# Patient Record
Sex: Male | Born: 1945 | Race: Black or African American | Hispanic: No | Marital: Married | Smoking: Former smoker
Health system: Southern US, Community
[De-identification: ages and names within clinical notes are randomized; demographics above are authoritative.]

## PROBLEM LIST (undated history)

## (undated) DIAGNOSIS — R011 Cardiac murmur, unspecified: Secondary | ICD-10-CM

## (undated) DIAGNOSIS — N402 Nodular prostate without lower urinary tract symptoms: Secondary | ICD-10-CM

## (undated) DIAGNOSIS — G9341 Metabolic encephalopathy: Secondary | ICD-10-CM

## (undated) DIAGNOSIS — N182 Chronic kidney disease, stage 2 (mild): Secondary | ICD-10-CM

## (undated) DIAGNOSIS — E785 Hyperlipidemia, unspecified: Secondary | ICD-10-CM

## (undated) DIAGNOSIS — J449 Chronic obstructive pulmonary disease, unspecified: Secondary | ICD-10-CM

## (undated) DIAGNOSIS — N390 Urinary tract infection, site not specified: Secondary | ICD-10-CM

## (undated) DIAGNOSIS — F329 Major depressive disorder, single episode, unspecified: Secondary | ICD-10-CM

## (undated) DIAGNOSIS — E1165 Type 2 diabetes mellitus with hyperglycemia: Secondary | ICD-10-CM

## (undated) DIAGNOSIS — M109 Gout, unspecified: Secondary | ICD-10-CM

## (undated) DIAGNOSIS — J4489 Other specified chronic obstructive pulmonary disease: Secondary | ICD-10-CM

## (undated) DIAGNOSIS — E669 Obesity, unspecified: Secondary | ICD-10-CM

## (undated) DIAGNOSIS — R809 Proteinuria, unspecified: Secondary | ICD-10-CM

## (undated) DIAGNOSIS — N529 Male erectile dysfunction, unspecified: Secondary | ICD-10-CM

## (undated) DIAGNOSIS — B070 Plantar wart: Secondary | ICD-10-CM

## (undated) DIAGNOSIS — F3289 Other specified depressive episodes: Secondary | ICD-10-CM

## (undated) DIAGNOSIS — G4730 Sleep apnea, unspecified: Secondary | ICD-10-CM

## (undated) DIAGNOSIS — IMO0001 Reserved for inherently not codable concepts without codable children: Secondary | ICD-10-CM

## (undated) DIAGNOSIS — M659 Unspecified synovitis and tenosynovitis, unspecified site: Secondary | ICD-10-CM

## (undated) DIAGNOSIS — Z992 Dependence on renal dialysis: Secondary | ICD-10-CM

## (undated) DIAGNOSIS — M1990 Unspecified osteoarthritis, unspecified site: Secondary | ICD-10-CM

## (undated) DIAGNOSIS — I872 Venous insufficiency (chronic) (peripheral): Secondary | ICD-10-CM

## (undated) DIAGNOSIS — N400 Enlarged prostate without lower urinary tract symptoms: Secondary | ICD-10-CM

## (undated) DIAGNOSIS — I10 Essential (primary) hypertension: Secondary | ICD-10-CM

## (undated) HISTORY — DX: Male erectile dysfunction, unspecified: N52.9

## (undated) HISTORY — DX: Cardiac murmur, unspecified: R01.1

## (undated) HISTORY — DX: Proteinuria, unspecified: R80.9

## (undated) HISTORY — PX: DIALYSIS/PERMA CATHETER INSERTION: CATH118288

## (undated) HISTORY — DX: Plantar wart: B07.0

## (undated) HISTORY — DX: Unspecified osteoarthritis, unspecified site: M19.90

## (undated) HISTORY — DX: Major depressive disorder, single episode, unspecified: F32.9

## (undated) HISTORY — DX: Reserved for inherently not codable concepts without codable children: IMO0001

## (undated) HISTORY — DX: Essential (primary) hypertension: I10

## (undated) HISTORY — DX: Obesity, unspecified: E66.9

## (undated) HISTORY — DX: Type 2 diabetes mellitus with hyperglycemia: E11.65

## (undated) HISTORY — DX: Benign prostatic hyperplasia without lower urinary tract symptoms: N40.0

## (undated) HISTORY — DX: Other specified chronic obstructive pulmonary disease: J44.89

## (undated) HISTORY — PX: OTHER SURGICAL HISTORY: SHX169

## (undated) HISTORY — DX: Sleep apnea, unspecified: G47.30

## (undated) HISTORY — DX: Chronic kidney disease, stage 2 (mild): N18.2

## (undated) HISTORY — DX: Hyperlipidemia, unspecified: E78.5

## (undated) HISTORY — DX: Synovitis and tenosynovitis, unspecified: M65.9

## (undated) HISTORY — DX: Venous insufficiency (chronic) (peripheral): I87.2

## (undated) HISTORY — DX: Other specified depressive episodes: F32.89

## (undated) HISTORY — DX: Chronic obstructive pulmonary disease, unspecified: J44.9

## (undated) HISTORY — DX: Nodular prostate without lower urinary tract symptoms: N40.2

## (undated) HISTORY — DX: Gout, unspecified: M10.9

## (undated) HISTORY — DX: Unspecified synovitis and tenosynovitis, unspecified site: M65.90

---

## 2004-10-03 ENCOUNTER — Ambulatory Visit: Payer: Self-pay

## 2004-10-25 ENCOUNTER — Ambulatory Visit: Payer: Self-pay

## 2006-08-20 ENCOUNTER — Ambulatory Visit: Payer: Self-pay | Admitting: Family Medicine

## 2006-12-05 ENCOUNTER — Ambulatory Visit: Payer: Self-pay | Admitting: Specialist

## 2007-01-19 ENCOUNTER — Other Ambulatory Visit: Payer: Self-pay

## 2007-01-19 ENCOUNTER — Emergency Department: Payer: Self-pay | Admitting: Emergency Medicine

## 2007-01-31 ENCOUNTER — Ambulatory Visit: Payer: Self-pay | Admitting: Rheumatology

## 2007-04-24 ENCOUNTER — Ambulatory Visit: Payer: Self-pay | Admitting: Specialist

## 2007-09-03 ENCOUNTER — Ambulatory Visit: Payer: Self-pay | Admitting: Specialist

## 2008-01-23 ENCOUNTER — Emergency Department: Payer: Self-pay | Admitting: Emergency Medicine

## 2009-08-30 ENCOUNTER — Ambulatory Visit: Payer: Self-pay | Admitting: Gastroenterology

## 2011-10-09 ENCOUNTER — Ambulatory Visit (INDEPENDENT_AMBULATORY_CARE_PROVIDER_SITE_OTHER): Payer: Medicare Other | Admitting: Cardiovascular Disease

## 2011-10-09 ENCOUNTER — Encounter: Payer: Self-pay | Admitting: Cardiovascular Disease

## 2011-10-09 VITALS — BP 170/84 | HR 96 | Ht 77.0 in | Wt 309.2 lb

## 2011-10-09 DIAGNOSIS — R0989 Other specified symptoms and signs involving the circulatory and respiratory systems: Secondary | ICD-10-CM

## 2011-10-09 DIAGNOSIS — R0609 Other forms of dyspnea: Secondary | ICD-10-CM

## 2011-10-09 DIAGNOSIS — I10 Essential (primary) hypertension: Secondary | ICD-10-CM

## 2011-10-09 DIAGNOSIS — Z01810 Encounter for preprocedural cardiovascular examination: Secondary | ICD-10-CM

## 2011-10-09 DIAGNOSIS — R0600 Dyspnea, unspecified: Secondary | ICD-10-CM

## 2011-10-09 NOTE — Progress Notes (Signed)
HPI  This is a 66 year old male who is here today for consultation regarding preoperative cardiovascular evaluation for anticipated left foot surgery which is scheduled for tomorrow. The patient has no previous cardiac history. He has multiple chronic medical conditions that include hypertension, obesity, hyperlipidemia and type 2 diabetes. He denies any chest pain. He reports mild exertional dyspnea. He is able to do his activities of daily living without significant limitations. He had a recent ECG done showed Q waves in the inferior leads. As mentioned above, is not aware of previous history of myocardial infarction.  No Known Allergies   Current Outpatient Prescriptions on File Prior to Visit  Medication Sig Dispense Refill  . aspirin EC 81 MG tablet Take 81 mg by mouth daily.      . Blood Glucose Monitoring Suppl (ONE TOUCH ULTRA SYSTEM KIT) W/DEVICE KIT 1 kit by Does not apply route once.      . Cholecalciferol (VITAMIN D) 2000 UNITS CAPS Take by mouth.      . insulin aspart (NOVOLOG FLEXPEN) 100 UNIT/ML injection Inject into the skin 3 (three) times daily before meals.      . insulin detemir (LEVEMIR FLEXPEN) 100 UNIT/ML injection Inject into the skin at bedtime.      . Insulin Pen Needle 31G X 6 MM MISC by Does not apply route.      Marland Kitchen NEEDLE, DISP, 22 G (B-D DISP NEEDLE 22GX3/4") 22G X 3/4" MISC by Does not apply route.      . Needles & Syringes MISC by Does not apply route.      . niacin-simvastatin (SIMCOR) 1000-20 MG 24 hr tablet Take 1 tablet by mouth at bedtime.      . ONE TOUCH LANCETS MISC by Does not apply route.      . Saxagliptin-Metformin (KOMBIGLYZE XR) 2.05-998 MG TB24 Take 2 tablets daily      . telmisartan (MICARDIS) 80 MG tablet Take 80 mg by mouth daily.         Past Medical History  Diagnosis Date  . Plantar wart   . Hypertension   . Hyperlipidemia   . Type II or unspecified type diabetes mellitus without mention of complication, uncontrolled   . Obesity,  unspecified   . Microalbuminuria   . Chronic airway obstruction, not elsewhere classified   . Depressive disorder, not elsewhere classified   . Impotence of organic origin   . 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 Hypertrophy of prostate without urinary obstruction and other lower urinary tract symptoms (LUTS)   . Synovitis and tenosynovitis, unspecified   . Unspecified venous (peripheral) insufficiency      History reviewed. No pertinent past surgical history.   Family History  Problem Relation Age of Onset  . Hypertension Mother      History   Social History  . Marital Status: Married    Spouse Name: N/A    Number of Children: N/A  . Years of Education: N/A   Occupational History  . Not on file.   Social History Main Topics  . Smoking status: Former Smoker -- 1.0 packs/day for 30 years    Types: Cigarettes    Quit date: 02/25/2003  . Smokeless tobacco: Not on file  . Alcohol Use: Yes     occasional  . Drug Use: No  . Sexually Active:    Other Topics Concern  . Not on file   Social History Narrative  . No narrative on file  ROS Constitutional: Negative for fever, chills, diaphoresis, activity change, appetite change and fatigue.  HENT: Negative for hearing loss, nosebleeds, congestion, sore throat, facial swelling, drooling, trouble swallowing, neck pain, voice change, sinus pressure and tinnitus.  Eyes: Negative for photophobia, pain, discharge and visual disturbance.  Respiratory: Negative for apnea, cough, chest tightness and wheezing.  Cardiovascular: Negative for chest pain, palpitations and leg swelling.  Gastrointestinal: Negative for nausea, vomiting, abdominal pain, diarrhea, constipation, blood in stool and abdominal distention.  Genitourinary: Negative for dysuria, urgency, frequency, hematuria and decreased urine volume.  Musculoskeletal: Negative for myalgias, back pain, joint swelling, arthralgias and gait problem.  Skin:  Negative for color change, pallor, rash and wound.  Neurological: Negative for dizziness, tremors, seizures, syncope, speech difficulty, weakness, light-headedness, numbness and headaches.  Psychiatric/Behavioral: Negative for suicidal ideas, hallucinations, behavioral problems and agitation. The patient is not nervous/anxious.     PHYSICAL EXAM   BP 170/84  Pulse 96  Ht 6\' 5"  (1.956 m)  Wt 309 lb 4 oz (140.275 kg)  BMI 36.67 kg/m2 Constitutional: He is oriented to person, place, and time. He appears well-developed and well-nourished. No distress.  HENT: No nasal discharge.  Head: Normocephalic and atraumatic.  Eyes: Pupils are equal and round. Right eye exhibits no discharge. Left eye exhibits no discharge.  Neck: Normal range of motion. Neck supple. No JVD present. No thyromegaly present.  Cardiovascular: Normal rate, regular rhythm, normal heart sounds and. Exam reveals no gallop and no friction rub. No murmur heard.  Pulmonary/Chest: Effort normal and breath sounds normal. No stridor. No respiratory distress. He has no wheezes. He has no rales. He exhibits no tenderness.  Abdominal: Soft. Bowel sounds are normal. He exhibits no distension. There is no tenderness. There is no rebound and no guarding.  Musculoskeletal: Normal range of motion. He exhibits no edema and no tenderness.  Neurological: He is alert and oriented to person, place, and time. Coordination normal.  Skin: Skin is warm and dry. No rash noted. He is not diaphoretic. No erythema. No pallor.  Psychiatric: He has a normal mood and affect. His behavior is normal. Judgment and thought content normal.       EKG: Sinus  Rhythm  Low voltage in precordial leads.    ABNORMAL   ASSESSMENT AND PLAN

## 2011-10-09 NOTE — Patient Instructions (Addendum)
You can proceed with surgery at an overall low risk.  Follow up in 4 months.

## 2011-10-09 NOTE — Assessment & Plan Note (Signed)
The patient has no previous cardiac history and has no convincing symptoms of angina. He is able to perform activities of daily living without significant limitations. ECG shows isolated Q wave in lead 3 which is not diagnostic for an inferior MI. Thus, the patient can proceed with the planned foot surgery at an overall low risk. His blood pressure is elevated today but this blood pressure has been reasonably controlled recently. I did not make any changes in his medications.

## 2011-10-09 NOTE — Assessment & Plan Note (Signed)
The patient reports mild exertional dyspnea and has multiple risk factors for coronary artery disease. It's probably worth evaluating him with some form of cardiac stress testing in the future for risk stratification. I don't feel that this is needed before clearing him to have the foot surgery done.

## 2012-04-29 ENCOUNTER — Ambulatory Visit: Payer: Self-pay | Admitting: Family Medicine

## 2012-05-23 ENCOUNTER — Ambulatory Visit: Payer: Self-pay | Admitting: Family Medicine

## 2012-06-23 ENCOUNTER — Ambulatory Visit: Payer: Self-pay | Admitting: Family Medicine

## 2012-11-18 ENCOUNTER — Encounter: Payer: Self-pay | Admitting: *Deleted

## 2012-11-19 ENCOUNTER — Encounter: Payer: Self-pay | Admitting: Podiatry

## 2012-11-19 ENCOUNTER — Ambulatory Visit (INDEPENDENT_AMBULATORY_CARE_PROVIDER_SITE_OTHER): Payer: Medicare Other | Admitting: Podiatry

## 2012-11-19 VITALS — BP 158/99 | HR 94 | Resp 12 | Ht 77.0 in | Wt 304.0 lb

## 2012-11-19 DIAGNOSIS — B351 Tinea unguium: Secondary | ICD-10-CM

## 2012-11-19 DIAGNOSIS — Q828 Other specified congenital malformations of skin: Secondary | ICD-10-CM

## 2012-11-19 DIAGNOSIS — M79609 Pain in unspecified limb: Secondary | ICD-10-CM

## 2012-11-19 NOTE — Progress Notes (Signed)
Subjective:     Patient ID: Joseph Newman, male   DOB: [birth year], 67 y.o.   MRN: [id number]  HPI patient presents stating that corns on the bottom my feet are very sore and my nails. States it started in the last several weeks   Review of Systems     Objective:   Physical Exam  Nursing note and vitals reviewed. Cardiovascular: Intact distal pulses.   Musculoskeletal: Normal range of motion.   severe keratotic lesion subsecond metatarsal right third metatarsal left and left lateral foot Nail disease with extreme thickness and pain 1-5 both feet    Assessment:     Porokeratotic lesion secondary to skin type oh to be and mycotic nail infections 1-5 both feet with pain    Plan:     Debridement of nails 1-5 both feet no iatrogenic bleeding debridement of 3 separate lesions with no iatrogenic bleeding noted

## 2013-02-04 ENCOUNTER — Ambulatory Visit: Payer: Medicare Other | Admitting: Podiatry

## 2013-02-07 ENCOUNTER — Ambulatory Visit: Payer: Self-pay | Admitting: Podiatry

## 2013-02-21 ENCOUNTER — Ambulatory Visit: Payer: Self-pay | Admitting: Podiatry

## 2013-02-28 ENCOUNTER — Encounter: Payer: Self-pay | Admitting: Podiatry

## 2013-02-28 ENCOUNTER — Ambulatory Visit (INDEPENDENT_AMBULATORY_CARE_PROVIDER_SITE_OTHER): Payer: Medicare Other | Admitting: Podiatry

## 2013-02-28 VITALS — BP 164/90 | HR 94 | Resp 16 | Ht 77.0 in | Wt 300.0 lb

## 2013-02-28 DIAGNOSIS — B351 Tinea unguium: Secondary | ICD-10-CM

## 2013-02-28 DIAGNOSIS — M79609 Pain in unspecified limb: Secondary | ICD-10-CM

## 2013-02-28 DIAGNOSIS — L84 Corns and callosities: Secondary | ICD-10-CM

## 2013-02-28 NOTE — Progress Notes (Signed)
Subjective:     Patient ID: Joseph Newman, male   DOB: [birth year], 68 y.o.   MRN: [id number]  HPI patient presents with thick painful nailbeds 1-5 both feet that he cannot cut them keratotic lesion sub-second metatarsal right third metatarsal left that are thick and painful and unable to be taken care of   Review of Systems     Objective:   Physical Exam Neurovascular status intact with history unchanged and severe plantar keratotic lesions painful and nails are very thick and deformed and painful 1-5 both feet    Assessment:     Mycotic nail infection with pain 1-5 both in severe keratotic lesions plantar aspect both feet    Plan:     Debridement of painful nailbeds 1-5 both feet and debridement of lesions plantar aspect both feet

## 2013-03-03 ENCOUNTER — Ambulatory Visit (INDEPENDENT_AMBULATORY_CARE_PROVIDER_SITE_OTHER): Payer: 59 | Admitting: Cardiovascular Disease

## 2013-03-03 ENCOUNTER — Encounter: Payer: Self-pay | Admitting: Cardiovascular Disease

## 2013-03-03 VITALS — BP 175/104 | HR 88 | Ht 77.0 in | Wt 290.5 lb

## 2013-03-03 DIAGNOSIS — R0600 Dyspnea, unspecified: Secondary | ICD-10-CM

## 2013-03-03 DIAGNOSIS — R0609 Other forms of dyspnea: Secondary | ICD-10-CM

## 2013-03-03 DIAGNOSIS — I10 Essential (primary) hypertension: Secondary | ICD-10-CM | POA: Insufficient documentation

## 2013-03-03 DIAGNOSIS — R0989 Other specified symptoms and signs involving the circulatory and respiratory systems: Secondary | ICD-10-CM

## 2013-03-03 MED ORDER — AMLODIPINE BESYLATE 5 MG PO TABS: 5.0000 mg | ORAL_TABLET | Freq: Every day | ORAL | Status: DC

## 2013-03-03 NOTE — Assessment & Plan Note (Signed)
His blood pressure is significantly uncontrolled. I increased amlodipine to 5 mg once daily. If renal function is normal, he would benefit from being on a thiazide diuretic.

## 2013-03-03 NOTE — Assessment & Plan Note (Signed)
The patient has significant exertional dyspnea without chest discomfort. He has multiple risk factors for coronary artery disease including uncontrolled diabetes and hypertension. I recommend further evaluation with a pharmacologic nuclear stress test. He is not able to exercise on a treadmill due to previous left foot surgery.

## 2013-03-03 NOTE — Progress Notes (Signed)
HPI  This is a 68 year old male who is here today for a followup visit regarding exertional dyspnea and hypertension. I saw him in 2013  For preoperative cardiovascular evaluation for anticipated left foot surgery. His EKG showed isolated Q wave in lead 3.  He has multiple chronic medical conditions that include hypertension, obesity, hyperlipidemia and type 2 diabetes.  Given the lack of anginal symptoms reasonable functional capacity, he was cleared for the surgery which was done without complications.  I instructed him at that time to return to see me for further ischemic evaluation given his multiple risk factors for coronary artery disease and exertional dyspnea. His exertional dyspnea has worsened over the last year with no chest discomfort. No orthopnea or PND. His diabetes continues to be uncontrolled with most recent hemoglobin A1c of 13. His blood pressure is also uncontrolled. Micardis was added recently.   No Known Allergies   Current Outpatient Prescriptions on File Prior to Visit  Medication Sig Dispense Refill  . aspirin EC 81 MG tablet Take 81 mg by mouth daily.      . Blood Glucose Monitoring Suppl (ONE TOUCH ULTRA SYSTEM KIT) W/DEVICE KIT 1 kit by Does not apply route once.      . Cholecalciferol (VITAMIN D) 2000 UNITS CAPS Take by mouth.      . insulin aspart (NOVOLOG FLEXPEN) 100 UNIT/ML injection Inject into the skin 3 (three) times daily before meals.      . insulin detemir (LEVEMIR FLEXPEN) 100 UNIT/ML injection Inject into the skin at bedtime.      . Insulin Pen Needle 31G X 6 MM MISC by Does not apply route.      Marland Kitchen NEEDLE, DISP, 22 G (B-D DISP NEEDLE 22GX3/4") 22G X 3/4" MISC by Does not apply route.      . Needles & Syringes MISC by Does not apply route.      . niacin-simvastatin (SIMCOR) 1000-20 MG 24 hr tablet Take 1 tablet by mouth at bedtime.      . ONE TOUCH LANCETS MISC by Does not apply route.      Marland Kitchen telmisartan (MICARDIS) 80 MG tablet Take 80 mg by mouth  daily.       No current facility-administered medications on file prior to visit.     Past Medical History  Diagnosis Date  . Plantar wart   . Hypertension   . Hyperlipidemia   . Type II or unspecified type diabetes mellitus without mention of complication, uncontrolled   . Obesity, unspecified   . Microalbuminuria   . Chronic airway obstruction, not elsewhere classified   . Depressive disorder, not elsewhere classified   . Impotence of organic origin   . 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 Hypertrophy of prostate without urinary obstruction and other lower urinary tract symptoms (LUTS)   . Synovitis and tenosynovitis, unspecified   . Unspecified venous (peripheral) insufficiency      Past Surgical History  Procedure Laterality Date  . Foot surgery       Family History  Problem Relation Age of Onset  . Hypertension Mother      History   Social History  . Marital Status: Married    Spouse Name: N/A    Number of Children: N/A  . Years of Education: N/A   Occupational History  . Not on file.   Social History Main Topics  . Smoking status: Current Every Day Smoker -- 1.00 packs/day for 30 years    Types:  Cigarettes, Cigars    Last Attempt to Quit: 02/25/2003  . Smokeless tobacco: Not on file  . Alcohol Use: Yes     Comment: occasional  . Drug Use: No  . Sexual Activity: Not on file   Other Topics Concern  . Not on file   Social History Narrative  . No narrative on file       PHYSICAL EXAM   BP 175/104  Pulse 88  Ht 6' 5"  (1.956 m)  Wt 290 lb 8 oz (131.77 kg)  BMI 34.44 kg/m2 Constitutional: He is oriented to person, place, and time. He appears well-developed and well-nourished. No distress.  HENT: No nasal discharge.  Head: Normocephalic and atraumatic.  Eyes: Pupils are equal and round. Right eye exhibits no discharge. Left eye exhibits no discharge.  Neck: Normal range of motion. Neck supple. No JVD present. No thyromegaly  present.  Cardiovascular: Normal rate, regular rhythm, normal heart sounds and. Exam reveals no gallop and no friction rub. No murmur heard.  Pulmonary/Chest: Effort normal and breath sounds normal. No stridor. No respiratory distress. He has no wheezes. He has no rales. He exhibits no tenderness.  Abdominal: Soft. Bowel sounds are normal. He exhibits no distension. There is no tenderness. There is no rebound and no guarding.  Musculoskeletal: Normal range of motion. He exhibits no edema and no tenderness.  Neurological: He is alert and oriented to person, place, and time. Coordination normal.  Skin: Skin is warm and dry. No rash noted. He is not diaphoretic. No erythema. No pallor.  Psychiatric: He has a normal mood and affect. His behavior is normal. Judgment and thought content normal.       EKG: Normal sinus rhythm with nonspecific T wave changes  ASSESSMENT AND PLAN

## 2013-03-03 NOTE — Patient Instructions (Addendum)
Your physician has recommended you make the following change in your medication:  Increase your Amlodipine to 5 mg daily     Follow up as needed     [place] caregiver has ordered a Stress Test with nuclear imaging. The purpose of this test is to evaluate the blood supply to your heart muscle. This procedure is referred to as a "Non-Invasive Stress Test." This is because other than having an IV started in your vein, nothing is inserted or "invades" your body. Cardiac stress tests are done to find areas of poor blood flow to the heart by determining the extent of coronary artery disease (CAD). Some patients exercise on a treadmill, which naturally increases the blood flow to your heart, while others who are  unable to walk on a treadmill due to physical limitations have a pharmacologic/chemical stress agent called Lexiscan . This medicine will mimic walking on a treadmill by temporarily increasing your coronary blood flow.   Please note: these test may take anywhere between 2-4 hours to complete  PLEASE REPORT TO [facility] AT THE FIRST DESK WILL DIRECT YOU WHERE TO GO  Date of Procedure:_______________2/11/15______________________  Arrival Time for Procedure:___________0745 am___________________  Instructions regarding medication:   _x___ : Hold diabetes medication morning of procedure   PLEASE NOTIFY THE OFFICE AT LEAST 24 HOURS IN ADVANCE IF YOU ARE UNABLE TO KEEP YOUR APPOINTMENT.  [phone] AND  PLEASE NOTIFY NUCLEAR MEDICINE AT [facility] AT LEAST 24 HOURS IN ADVANCE IF YOU ARE UNABLE TO KEEP YOUR APPOINTMENT. [phone]  How to prepare for your Myoview test:  1. Do not eat or drink after midnight 2. No caffeine for 24 hours prior to test 3. No smoking 24 hours prior to test. 4. Your medication may be taken with water.  If your doctor stopped a medication because of this test, do not take that medication. 5. Ladies, please do not wear  dresses.  Skirts or pants are appropriate. Please wear a short sleeve shirt. 6. No perfume, cologne or lotion. 7. Wear comfortable walking shoes. No heels!

## 2013-03-05 ENCOUNTER — Ambulatory Visit: Payer: Self-pay | Admitting: Cardiovascular Disease

## 2013-03-06 ENCOUNTER — Other Ambulatory Visit: Payer: Self-pay

## 2013-03-06 DIAGNOSIS — R0600 Dyspnea, unspecified: Secondary | ICD-10-CM

## 2013-04-04 ENCOUNTER — Encounter: Payer: Self-pay | Admitting: Cardiovascular Disease

## 2013-04-04 ENCOUNTER — Ambulatory Visit (INDEPENDENT_AMBULATORY_CARE_PROVIDER_SITE_OTHER): Payer: 59 | Admitting: Cardiovascular Disease

## 2013-04-04 VITALS — BP 190/69 | HR 93 | Ht 77.0 in | Wt 294.5 lb

## 2013-04-04 DIAGNOSIS — I10 Essential (primary) hypertension: Secondary | ICD-10-CM

## 2013-04-04 MED ORDER — TELMISARTAN-HCTZ 80-25 MG PO TABS: 1.0000 | ORAL_TABLET | Freq: Every day | ORAL | Status: DC

## 2013-04-04 NOTE — Patient Instructions (Addendum)
Your physician has recommended you make the following change in your medication:  Stop Micardis  Start Micardis-HCTZ 80/25 mg daily   Your physician recommends that you schedule a follow-up appointment in:  As needed   Your physician recommends that you return for lab work in: 1 week  BMP  Follow a low sodium diet

## 2013-04-04 NOTE — Assessment & Plan Note (Signed)
Still uncontrolled.  I added HCTZ to Micardis. Check BMP in 1 week. He has a follow up with Dr. Ancil Boozer in few weeks.  I advised him to follow a low sodium diet.  If BP remains uncontrolled, consider adding Carvedilol.  Follow up as needed.

## 2013-04-04 NOTE — Progress Notes (Signed)
Primary care physician: Dr. Ancil Boozer  HPI  This is a 68 year old male who is here today for a followup visit regarding exertional dyspnea , abnormal EKG and hypertension.   He has multiple chronic medical conditions that include hypertension, obesity, hyperlipidemia and type 2 diabetes.  His exertional dyspnea has worsened over the last year with no chest discomfort. No orthopnea or PND. His diabetes continues to be uncontrolled with most recent hemoglobin A1c of 13. His blood pressure was also uncontrolled.  He was noted to be hypertensive during last visit. I increased amlodipine 5 mg once daily. He underwent a pharmacologic nuclear stress test which showed a fixed anterolateral infarct with normal wall motion. Ejection fraction was 63%. He has been doing reasonably well. He denies chest pain. BP remains uncontrolled.   No Known Allergies   Current Outpatient Prescriptions on File Prior to Visit  Medication Sig Dispense Refill  . amLODipine (NORVASC) 5 MG tablet Take 1 tablet (5 mg total) by mouth daily.  180 tablet  3  . aspirin EC 81 MG tablet Take 81 mg by mouth daily.      . Blood Glucose Monitoring Suppl (ONE TOUCH ULTRA SYSTEM KIT) W/DEVICE KIT 1 kit by Does not apply route once.      . Cholecalciferol (VITAMIN D) 2000 UNITS CAPS Take by mouth.      . insulin aspart (NOVOLOG FLEXPEN) 100 UNIT/ML injection Inject into the skin 3 (three) times daily before meals.      . insulin detemir (LEVEMIR FLEXPEN) 100 UNIT/ML injection Inject into the skin at bedtime.      . Insulin Pen Needle 31G X 6 MM MISC by Does not apply route.      Marland Kitchen NEEDLE, DISP, 22 G (B-D DISP NEEDLE 22GX3/4") 22G X 3/4" MISC by Does not apply route.      . Needles & Syringes MISC by Does not apply route.      . niacin-simvastatin (SIMCOR) 1000-20 MG 24 hr tablet Take 1 tablet by mouth at bedtime.      . ONE TOUCH LANCETS MISC by Does not apply route.       No current facility-administered medications on file prior to  visit.     Past Medical History  Diagnosis Date  . Plantar wart   . Hyperlipidemia   . Type II or unspecified type diabetes mellitus without mention of complication, uncontrolled   . Obesity, unspecified   . Microalbuminuria   . Chronic airway obstruction, not elsewhere classified   . Depressive disorder, not elsewhere classified   . Impotence of organic origin   . 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 Hypertrophy of prostate without urinary obstruction and other lower urinary tract symptoms (LUTS)   . Synovitis and tenosynovitis, unspecified   . Unspecified venous (peripheral) insufficiency   . Hypertension      Past Surgical History  Procedure Laterality Date  . Foot surgery       Family History  Problem Relation Age of Onset  . Hypertension Mother      History   Social History  . Marital Status: Married    Spouse Name: N/A    Number of Children: N/A  . Years of Education: N/A   Occupational History  . Not on file.   Social History Main Topics  . Smoking status: Current Every Day Smoker -- 1.00 packs/day for 30 years    Types: Cigarettes, Cigars    Last Attempt to Quit: 02/25/2003  .  Smokeless tobacco: Not on file  . Alcohol Use: Yes     Comment: occasional  . Drug Use: No  . Sexual Activity: Not on file   Other Topics Concern  . Not on file   Social History Narrative  . No narrative on file       PHYSICAL EXAM   BP 190/69  Pulse 93  Ht 6' 5"  (1.956 m)  Wt 294 lb 8 oz (133.584 kg)  BMI 34.92 kg/m2 Constitutional: He is oriented to person, place, and time. He appears well-developed and well-nourished. No distress.  HENT: No nasal discharge.  Head: Normocephalic and atraumatic.  Eyes: Pupils are equal and round. Right eye exhibits no discharge. Left eye exhibits no discharge.  Neck: Normal range of motion. Neck supple. No JVD present. No thyromegaly present.  Cardiovascular: Normal rate, regular rhythm, normal heart sounds and.  Exam reveals no gallop and no friction rub. No murmur heard.  Pulmonary/Chest: Effort normal and breath sounds normal. No stridor. No respiratory distress. He has no wheezes. He has no rales. He exhibits no tenderness.  Abdominal: Soft. Bowel sounds are normal. He exhibits no distension. There is no tenderness. There is no rebound and no guarding.  Musculoskeletal: Normal range of motion. He exhibits no edema and no tenderness.  Neurological: He is alert and oriented to person, place, and time. Coordination normal.  Skin: Skin is warm and dry. No rash noted. He is not diaphoretic. No erythema. No pallor.  Psychiatric: He has a normal mood and affect. His behavior is normal. Judgment and thought content normal.      ASSESSMENT AND PLAN

## 2013-04-04 NOTE — Assessment & Plan Note (Signed)
Likely multifactorial due to physical deconditioning, obesity and diastolic heart failure with uncontrolled hypertension.  Stress test showed no evidence of ischemia with normal EF.  Recommend lifestyle changes.

## 2013-04-11 ENCOUNTER — Ambulatory Visit (INDEPENDENT_AMBULATORY_CARE_PROVIDER_SITE_OTHER): Payer: 59 | Admitting: *Deleted

## 2013-04-11 DIAGNOSIS — I10 Essential (primary) hypertension: Secondary | ICD-10-CM

## 2013-04-12 LAB — BASIC METABOLIC PANEL WITH GFR
BUN/Creatinine Ratio: 23 — ABNORMAL HIGH (ref 10–22)
BUN: 28 mg/dL — ABNORMAL HIGH (ref 8–27)
CO2: 24 mmol/L (ref 18–29)
Calcium: 9.1 mg/dL (ref 8.6–10.2)
Chloride: 100 mmol/L (ref 97–108)
Creatinine, Ser: 1.23 mg/dL (ref 0.76–1.27)
GFR calc Af Amer: 69 mL/min/1.73
GFR calc non Af Amer: 60 mL/min/1.73
Glucose: 250 mg/dL — ABNORMAL HIGH (ref 65–99)
Potassium: 4.4 mmol/L (ref 3.5–5.2)
Sodium: 138 mmol/L (ref 134–144)

## 2013-05-23 ENCOUNTER — Ambulatory Visit (INDEPENDENT_AMBULATORY_CARE_PROVIDER_SITE_OTHER): Payer: Medicare Other | Admitting: Podiatry

## 2013-05-23 VITALS — Resp 16 | Ht 77.0 in | Wt 298.0 lb

## 2013-05-23 DIAGNOSIS — B351 Tinea unguium: Secondary | ICD-10-CM

## 2013-05-23 DIAGNOSIS — L84 Corns and callosities: Secondary | ICD-10-CM

## 2013-05-23 DIAGNOSIS — M79609 Pain in unspecified limb: Secondary | ICD-10-CM

## 2013-05-25 NOTE — Progress Notes (Signed)
Subjective:     Patient ID: Joseph Newman, male   DOB: [birth year], 68 y.o.   MRN: [id number]  HPI patient presents with thick dystrophic nailbeds 1-5 both feet that are painful for him and lesion subsecond metatarsal of both feet that are painful with thickness noted   Review of Systems     Objective:   Physical Exam Neurovascular status unchanged with patient well oriented x3 and is found to have severe keratotic lesion sub-to both feet and nail disease with brittle-like appearance and thickness 1-5 both    Assessment:     Mycotic nail infection and and lesion formation with pain both feet    Plan:     Debridement painful nail bed 1-5 both feet and lesions on both feet with no iatrogenic bleeding noted

## 2013-07-03 ENCOUNTER — Ambulatory Visit: Payer: Self-pay | Admitting: Family Medicine

## 2013-07-09 LAB — COMPREHENSIVE METABOLIC PANEL
ALT: 33 U/L (ref 12–78)
ANION GAP: 7 (ref 7–16)
Albumin: 3.4 g/dL (ref 3.4–5.0)
Alkaline Phosphatase: 171 U/L — ABNORMAL HIGH
BUN: 60 mg/dL — ABNORMAL HIGH (ref 7–18)
Bilirubin,Total: 0.5 mg/dL (ref 0.2–1.0)
CHLORIDE: 96 mmol/L — AB (ref 98–107)
Calcium, Total: 8.6 mg/dL (ref 8.5–10.1)
Co2: 20 mmol/L — ABNORMAL LOW (ref 21–32)
Creatinine: 2.22 mg/dL — ABNORMAL HIGH (ref 0.60–1.30)
EGFR (African American): 34 — ABNORMAL LOW
GFR CALC NON AF AMER: 29 — AB
GLUCOSE: 786 mg/dL — AB (ref 65–99)
Osmolality: 303 (ref 275–301)
Potassium: 6.6 mmol/L (ref 3.5–5.1)
SGOT(AST): 33 U/L (ref 15–37)
Sodium: 123 mmol/L — ABNORMAL LOW (ref 136–145)
TOTAL PROTEIN: 7.8 g/dL (ref 6.4–8.2)

## 2013-07-09 LAB — CBC
HCT: 52.4 % — AB (ref 40.0–52.0)
HGB: 17.2 g/dL (ref 13.0–18.0)
MCH: 30.1 pg (ref 26.0–34.0)
MCHC: 32.8 g/dL (ref 32.0–36.0)
MCV: 92 fL (ref 80–100)
Platelet: 160 10*3/uL (ref 150–440)
RBC: 5.71 10*6/uL (ref 4.40–5.90)
RDW: 15.5 % — AB (ref 11.5–14.5)
WBC: 10.8 10*3/uL — AB (ref 3.8–10.6)

## 2013-07-10 ENCOUNTER — Inpatient Hospital Stay: Payer: Self-pay | Admitting: Internal Medicine

## 2013-07-10 LAB — BASIC METABOLIC PANEL
ANION GAP: 7 (ref 7–16)
Anion Gap: 7 (ref 7–16)
Anion Gap: 6 — ABNORMAL LOW (ref 7–16)
Anion Gap: 5 — ABNORMAL LOW (ref 7–16)
BUN: 56 mg/dL — ABNORMAL HIGH (ref 7–18)
BUN: 55 mg/dL — ABNORMAL HIGH (ref 7–18)
BUN: 51 mg/dL — ABNORMAL HIGH (ref 7–18)
BUN: 47 mg/dL — ABNORMAL HIGH (ref 7–18)
CALCIUM: 8.7 mg/dL (ref 8.5–10.1)
CHLORIDE: 106 mmol/L (ref 98–107)
CHLORIDE: 104 mmol/L (ref 98–107)
CO2: 25 mmol/L (ref 21–32)
Calcium, Total: 9.1 mg/dL (ref 8.5–10.1)
Calcium, Total: 8.9 mg/dL (ref 8.5–10.1)
Calcium, Total: 8.7 mg/dL (ref 8.5–10.1)
Chloride: 101 mmol/L (ref 98–107)
Chloride: 106 mmol/L (ref 98–107)
Co2: 24 mmol/L (ref 21–32)
Co2: 26 mmol/L (ref 21–32)
Co2: 25 mmol/L (ref 21–32)
Creatinine: 2.03 mg/dL — ABNORMAL HIGH (ref 0.60–1.30)
Creatinine: 1.79 mg/dL — ABNORMAL HIGH (ref 0.60–1.30)
Creatinine: 1.66 mg/dL — ABNORMAL HIGH (ref 0.60–1.30)
Creatinine: 1.7 mg/dL — ABNORMAL HIGH (ref 0.60–1.30)
EGFR (African American): 37 — ABNORMAL LOW
EGFR (African American): 44 — ABNORMAL LOW
EGFR (African American): 48 — ABNORMAL LOW
EGFR (Non-African Amer.): 32 — ABNORMAL LOW
EGFR (Non-African Amer.): 38 — ABNORMAL LOW
EGFR (Non-African Amer.): 41 — ABNORMAL LOW
GFR CALC AF AMER: 46 — AB
GFR CALC NON AF AMER: 40 — AB
Glucose: 461 mg/dL — ABNORMAL HIGH (ref 65–99)
Glucose: 233 mg/dL — ABNORMAL HIGH (ref 65–99)
Glucose: 218 mg/dL — ABNORMAL HIGH (ref 65–99)
Glucose: 372 mg/dL — ABNORMAL HIGH (ref 65–99)
OSMOLALITY: 298 (ref 275–301)
OSMOLALITY: 296 (ref 275–301)
Osmolality: 300 (ref 275–301)
Osmolality: 296 (ref 275–301)
Potassium: 5.7 mmol/L — ABNORMAL HIGH (ref 3.5–5.1)
Potassium: 5 mmol/L (ref 3.5–5.1)
Potassium: 4.8 mmol/L (ref 3.5–5.1)
Potassium: 5.2 mmol/L — ABNORMAL HIGH (ref 3.5–5.1)
SODIUM: 138 mmol/L (ref 136–145)
Sodium: 132 mmol/L — ABNORMAL LOW (ref 136–145)
Sodium: 138 mmol/L (ref 136–145)
Sodium: 134 mmol/L — ABNORMAL LOW (ref 136–145)

## 2013-07-10 LAB — URINALYSIS, COMPLETE
BILIRUBIN, UR: NEGATIVE
Bacteria: NONE SEEN
Glucose,UR: >=500 mg/dL (ref 0–75)
Ketone: NEGATIVE
Leukocyte Esterase: NEGATIVE
NITRITE: NEGATIVE
PH: 5 (ref 4.5–8.0)
PROTEIN: NEGATIVE
RBC,UR: 1 /HPF (ref 0–5)
Specific Gravity: 1.023 (ref 1.003–1.030)
Squamous Epithelial: NONE SEEN
WBC UR: <1 /HPF (ref 0–5)

## 2013-07-10 LAB — HEMOGLOBIN A1C: Hemoglobin A1C: 11.8 % — ABNORMAL HIGH (ref 4.2–6.3)

## 2013-08-29 ENCOUNTER — Ambulatory Visit (INDEPENDENT_AMBULATORY_CARE_PROVIDER_SITE_OTHER): Payer: Medicare Other | Admitting: Podiatry

## 2013-08-29 DIAGNOSIS — E1159 Type 2 diabetes mellitus with other circulatory complications: Secondary | ICD-10-CM

## 2013-08-29 DIAGNOSIS — M79609 Pain in unspecified limb: Secondary | ICD-10-CM

## 2013-08-29 DIAGNOSIS — Q828 Other specified congenital malformations of skin: Secondary | ICD-10-CM

## 2013-08-29 DIAGNOSIS — B351 Tinea unguium: Secondary | ICD-10-CM

## 2013-08-29 DIAGNOSIS — M79673 Pain in unspecified foot: Secondary | ICD-10-CM

## 2013-08-29 NOTE — Progress Notes (Signed)
Subjective:     Patient ID: Joseph Newman, male   DOB: [birth year], 68 y.o.   MRN: [id number]  HPI patient presents with thick toenails 1-5 bilateral that are painful when pressed and severe keratotic lesion plantar aspect of both feet that makes walking difficult. He is a diabetic and is not have great neurological testing   Review of Systems     Objective:   Physical Exam Neurovascular status was diminished but intact with diminished sharp Dole vibratory in pulses. Patient's found to have thick yellow brittle nailbeds 1-5 both feet and deep keratotic lesions underneath the third metatarsal left second metatarsal right    Assessment:     At risk diabetic with severe lesion formation and nail disease 1-5 of both feet    Plan:     Debride painful nailbeds 1-5 both feet and debridement lesions on both feet with no iatrogenic bleeding noted

## 2013-11-28 ENCOUNTER — Other Ambulatory Visit: Payer: Medicare Other

## 2013-12-12 ENCOUNTER — Ambulatory Visit (INDEPENDENT_AMBULATORY_CARE_PROVIDER_SITE_OTHER): Payer: Medicare Other | Admitting: Podiatry

## 2013-12-12 DIAGNOSIS — M79673 Pain in unspecified foot: Secondary | ICD-10-CM

## 2013-12-12 DIAGNOSIS — B351 Tinea unguium: Secondary | ICD-10-CM

## 2013-12-12 DIAGNOSIS — L84 Corns and callosities: Secondary | ICD-10-CM

## 2013-12-12 NOTE — Progress Notes (Signed)
Subjective:     Patient ID: Joseph Newman, male   DOB: December 07, 1945, 68 y.o.   MRN: [id number]  HPI patient presents with thick toenails 1-5 bilateral that are painful when pressed and severe keratotic lesion plantar aspect of both feet that makes walking difficult. He is a diabetic and is not have great neurological testing   Review of Systems     Objective:   Physical Exam Neurovascular status was diminished but intact with diminished sharp Dole vibratory in pulses. Patient's found to have thick yellow brittle nailbeds 1-5 both feet and deep keratotic lesions underneath the third metatarsal left second metatarsal right    Assessment:     At risk diabetic with severe lesion formation and nail disease 1-5 of both feet    Plan:     Debride painful nailbeds 1-5 both feet and debridement lesions on both feet with no iatrogenic bleeding noted

## 2014-03-13 ENCOUNTER — Other Ambulatory Visit: Payer: Medicare Other

## 2014-03-20 ENCOUNTER — Ambulatory Visit (INDEPENDENT_AMBULATORY_CARE_PROVIDER_SITE_OTHER): Payer: Medicare Other | Admitting: Podiatry

## 2014-03-20 DIAGNOSIS — M79673 Pain in unspecified foot: Secondary | ICD-10-CM

## 2014-03-20 DIAGNOSIS — E1151 Type 2 diabetes mellitus with diabetic peripheral angiopathy without gangrene: Secondary | ICD-10-CM

## 2014-03-20 DIAGNOSIS — B351 Tinea unguium: Secondary | ICD-10-CM

## 2014-03-20 DIAGNOSIS — Q828 Other specified congenital malformations of skin: Secondary | ICD-10-CM

## 2014-03-20 NOTE — Progress Notes (Signed)
Subjective:     Patient ID: Joseph Newman, male   DOB: 08-26-1945, 69 y.o.   MRN: [id number]  HPI patient is long-term diabetic with severe lesion sub-metatarsal bilateral with the left being worse than the right and he ulcerative on the left. Also has significant nail disease 1-5 both feet that is thick yellow and brittle painful   Review of Systems     Objective:   Physical Exam Neurovascular status diminished with diminished sharp Dole vibratory diminished pedal pulses both PT and DP diminished hair growth and shiny like appearance. He is noted to have significant plantar keratotic lesion sub-third metatarsal left third metatarsal right and fifth metatarsal right that are very painful    Assessment:     At risk diabetic with nail disease and severe plantar lesion formation bilateral    Plan:     Debride painful nailbeds 1-5 both feet and lesions and discussed long-term diabetic shoes to try to reduce stress on his feet and hopefully prevent ulcerations we will get approval

## 2014-03-20 NOTE — Progress Notes (Signed)
Subjective:     Patient ID: Joseph Newman, male   DOB: 04-25-1945, 69 y.o.   MRN: [id number]  HPI patient presents with thick toenails 1-5 bilateral that are painful when pressed and severe keratotic lesion plantar aspect of both feet that makes walking difficult. He is a diabetic and is not have great neurological testing   Review of Systems     Objective:   Physical Exam Neurovascular status was diminished but intact with diminished sharp Dole vibratory in pulses. Patient's found to have thick yellow brittle nailbeds 1-5 both feet and deep keratotic lesions underneath the third metatarsal left second metatarsal right    Assessment:     At risk diabetic with severe lesion formation and nail disease 1-5 of both feet    Plan:     Debride painful nailbeds 1-5 both feet and debridement lesions on both feet with no iatrogenic bleeding noted

## 2014-04-27 ENCOUNTER — Telehealth: Payer: Self-pay

## 2014-04-27 NOTE — Telephone Encounter (Signed)
Pt needs appt with Dr. Eddie Dibbles before therapeutic footwear can be provided.

## 2014-05-16 NOTE — Discharge Summary (Signed)
PATIENT NAME:  Joseph Newman, Joseph Newman MR#:  [id number] DATE OF BIRTH:  1946/01/21  DATE OF ADMISSION:  07/10/2013 DATE OF DISCHARGE:  07/10/2013  PRESENTING COMPLAINT: Weakness.   DISCHARGE DIAGNOSES: 1.  Uncontrolled type 2 diabetes, suspected from p.o. steroid taper.  2.  Type 2 diabetes.  3.  Hypertension.  4.  Acute renal failure, appears prerenal azotemia, improving. 5.  Hyperkalemia, resolved.   CODE STATUS: FULL.   DISCHARGE MEDICATIONS: 1.  Kombiglyze XR 1000/2.5 extended-release b.i.d. 2.  Simcor 500/40 one p.o. at bedtime.  3.  Vitamin D3 1000 units 2 tablets daily.  4.  Levemir FlexPen 50 units subcu b.i.d.  5.  NovoLog FlexPen 16 units subcu t.i.d.  6.  Micardis 80 mg daily.  7.  Prednisone.  8.  Acetaminophen/hydrocodone 300/5 one tablet every 6 hours.  9.  Amlodipine 10 mg daily.   DISCHARGE DIET: Carbohydrate controlled.  DISCHARGE FOLLOWUP: Follow up with Dr. Ancil Boozer in 1 to 2 weeks.   DIAGNOSTIC DATA: Labs at discharge: Creatinine is 1.66. BUN is 51. Glucose is 218. potassium is 4.8.   BRIEF SUMMARY OF HOSPITAL COURSE: Mr. Kava is a 69 year old African American gentleman with history of type 2 diabetes and hypertension comes in with:  1.  Uncontrolled type 2 diabetes. His sugars were up in the 600 to 700s. The patient was recently started on prednisone taper for his arthritis. He also was noted to be in renal failure and received aggressive IV hydration. The patient was placed on insulin drip. Sugars came down relatively fast, was switched back to his Levemir and NovoLog. Diet was resumed. The patient was feeling better.  2.  Pseudohyponatremia from hyperglycemia. Resolved after sugars being stable.  3.  Hyperkalemia were no significant EKG changes. Potassium on admission was 6.6, came down to 4.8 at discharge.  4.  Acute kidney injury, probably prenatal azotemia. Received aggressive IV hydration. Creatinine at discharge was 1.66. Nephrotoxins were avoided. The patient had  good urine output.  5.  History of hypertension. Since his creatinine came down, stayed relatively stable, rapidly we resumed his ARB. Will defer to primary care physician. As the patient's potassium is an issue and his creatinine remains elevated, consider other line of agents for hypertension.  6.  Hyperlipidemia. Continued statins.   Hospital stay otherwise remained stable. The patient remained a FULL code.   TIME SPENT: 40 minutes.   ____________________________ Hart Rochester Posey Pronto, MD sap:sb D: 07/11/2013 06:49:35 ET T: 07/11/2013 08:25:39 ET JOB#: [id number]  cc: Sona A. Posey Pronto, MD, <Dictator> Bethena Roys. Ancil Boozer, MD Ilda Basset MD ELECTRONICALLY SIGNED 07/26/2013 10:22

## 2014-05-16 NOTE — H&P (Signed)
PATIENT NAME:  Joseph Newman, Joseph Newman MR#:  [id number] DATE OF BIRTH:  1945-04-29  DATE OF ADMISSION:  07/10/2013  PRIMARY CARE PHYSICIAN:  Dr. Ancil Boozer.  REFERRING PHYSICIAN:  Dr. Owens Shark.  CHIEF COMPLAINT:  Urinary frequency, feeling thirsty, weak and fingerstick was too high.   HISTORY OF PRESENT ILLNESS:  The patient is a 69 year old African Newman male who is brought into the ER by his wife as he is feeling extremely weak and also having frequent urination for the past 2 to 3 days and today he was feeling extremely thirsty and drinking lots of water.  He felt very weak and he could not stand on his feet in the evening.  Though the patient has chronic arthritis and hip and knee area, unable to standing up is unusual to him.  The patient is reporting that he is taking his insulin on a regular basis.  Denies any recent illness, but the patient is on prednisone taper.  This could cause hyperglycemia.  The patient is brought into the ER.  Initial Accu-Chek was greater than 600 and serum glucose is at 786.  The patient was given first liter fluid bolus and he is getting second liter fluid bolus, 10 units of bolus of regular insulin was given and the patient is started on hyperglycemia protocol with insulin drip.  During my examination the patient is resting comfortably.  Denies any chest pain or shortness of breath.  Wife is at bedside.  The patient has received 15 mg of Kayexalate while he is in the ER.   PAST MEDICAL HISTORY:  Insulin-dependent diabetes mellitus, hypertension, hyperlipidemia.   PAST SURGICAL HISTORY:  Left foot surgery.  ALLERGIES:  No known drug allergies.   PSYCHOSOCIAL HISTORY:  Lives at home with wife.  Smokes 1/2 pack a day and 3 to 4 cigarettes per day.  Occasional intake of alcohol.  Denies any illicit drug usage.   FAMILY HISTORY:  Diabetes runs in his family.    HOME MEDICATIONS:  Prednisone tapering dose, NovoLog FlexPen 60 units subcutaneously 3 times a day, Simcor 1 tablet by  mouth once daily, Micardis 80 mg by mouth once daily, Levemir FlexPen 50 units subcutaneously 2 times a day, amlodipine dose unknown orally once a day, Tylenol 1 tablet by mouth q. 6 hours as needed.   REVIEW OF SYSTEMS: CONSTITUTIONAL:  Denies any fever.  Complaining of fatigue.  EYES:  Denies blurry vision, double vision.  EARS, NOSE, THROAT:  Denies epistaxis, discharge, tinnitus.  RESPIRATORY:  Denies cough, COPD.  CARDIOVASCULAR:  No chest pain, palpitations.  Felt dizzy, but denies any loss of consciousness.   GASTROINTESTINAL:  Soft, bowel sounds are positive in all four quadrants aunt.  Nontender.  Minimal distention.  No masses felt.  No rebound tenderness.  NEUROLOGIC:  Awake, alert and oriented x 3.  Cranial nerves II through XII are intact.  Motor and sensory are intact.  Reflexes are 2+.  Following verbal commands.  EXTREMITIES:  Trace edema.  No cyanosis.  No clubbing.  SKIN:  Warm to touch.  Normal turgor.  No rashes.  No lesions.  MUSCULOSKELETAL:  No joint effusion, tenderness, erythema.  No CVA tenderness.  PSYCHIATRIC:  Normal mood and affect.   PHYSICAL EXAMINATION: VITAL SIGNS:  Temperature afebrile, pulse 80 to 91, respirations 18 to 19, blood pressure 134/72, pulse ox is 98%. GENERAL APPEARANCE:  Not under acute distress.  Moderately built and obese.  HEENT:  Normocephalic, atraumatic.  Pupils are equally reacting to light and accommodation.  No scleral icterus.  No conjunctival injection.  No sinus tenderness.  No postnasal drip.  Moist mucous membranes.  NECK:  Supple.  No JVD.  No thyromegaly.  Range of motion is intact.  LUNGS:  Clear to auscultation bilaterally.  No accessory muscle usage.  No anterior chest wall tenderness on palpation.  CARDIOVASCULAR:  S1, S2 normal.  Regular rate and rhythm.  No murmurs.  GASTROINTESTINAL:  Soft.  Bowel sounds are positive in all four quadrants.  Nontender, nondistended.  LABORATORY AND IMAGING STUDIES:  A 12-lead EKG with a  lot of artifact which was normal sinus rhythm with no acute ST-T wave changes.  Accu-Cheks greater than 600 x 4.  Glucose of the serum 786, BUN 60, creatinine 2.2, sodium 123, potassium 6.6, chloride 96, CO2 20, GFR 34.  Serum osmolality, anion gap and calcium are normal.  LFTs are normal except alkaline phosphatase which is elevated at 171.  WBC 10.8, RDW 15.5.  Hemoglobin 17.2, hematocrit 52.4, platelets are 160.  Urinalysis, looks clean, straw-colored, clear in appearance, greater than 500 glucose is present.  Bili negative, nitrites negative, ketones negative.  Nitrites are negative.  Leukocyte esterase is negative.    ASSESSMENT AND PLAN:  A 69 year old Joseph Newman male coming is with a three day history of polyuria, today he is extremely thirsty associated with generalized weakness will be admitted with the following assessment and plan.  1.  Hyperosmolar nonketotic acidosis.  We will admit him to Critical Care Unit.  We will provide him aggressive hydration including Emergency Room saline infusion.  We will provide 3 liters of normal saline following which the patient will be receiving normal saline at 150 mL per hour.  We will check hemoglobin A1c.  We will get basic metabolic panels q. 4 hours.  We will provide him diabetic education.  2.  Pseudohyponatremia from hyperglycemia.  No interventions needed at this time.  3.  Hyperlipidemia with no significant EKG changes.  Though he is hyperglycemia, you could see hyperkalemia as potassium is at 6.6, which is slightly higher than anticipated.  15 mL of Kayexalate was given in the Emergency Room.  Stat basic metabolic panel is ordered which is pending.  4.  Acute kidney injury, probably prerenal.  This could have renal component in view of diabetes mellitus.  We will provide aggressive hydration with intravenous fluids.  I will continue close monitoring of the renal function and avoid nephrotoxins.  5.  History of hypertension.  Resume his home  medications except ARB in view of acute kidney injury.  6.  Hyperlipidemia.  Continue his home medication statin. 7.  We will provide gastrointestinal and deep vein thrombosis prophylaxis.  8.  CODE STATUS:  HE IS FULL CODE.  Wife is the medical power of attorney.  Total critical care time spent is 60 minutes.       ____________________________ Nicholes Mango, MD ag:ea D: 07/10/2013 01:21:54 ET T: 07/10/2013 02:28:22 ET JOB#: [id number]  cc: Nicholes Mango, MD, <Dictator> Nicholes Mango MD ELECTRONICALLY SIGNED 07/13/2013 0:56

## 2014-06-18 LAB — LIPID PANEL
Cholesterol: 131 mg/dL (ref 0–200)
HDL: 23 mg/dL — AB (ref 35–70)
LDL Cholesterol: 57 mg/dL
Triglycerides: 259 mg/dL — AB (ref 40–160)

## 2014-06-19 ENCOUNTER — Ambulatory Visit: Payer: Self-pay

## 2014-06-23 ENCOUNTER — Ambulatory Visit (INDEPENDENT_AMBULATORY_CARE_PROVIDER_SITE_OTHER): Payer: Medicare Other | Admitting: Podiatry

## 2014-06-23 ENCOUNTER — Ambulatory Visit (INDEPENDENT_AMBULATORY_CARE_PROVIDER_SITE_OTHER): Payer: Medicare Other

## 2014-06-23 DIAGNOSIS — M79673 Pain in unspecified foot: Secondary | ICD-10-CM | POA: Diagnosis not present

## 2014-06-23 DIAGNOSIS — L97521 Non-pressure chronic ulcer of other part of left foot limited to breakdown of skin: Secondary | ICD-10-CM

## 2014-06-23 DIAGNOSIS — M79672 Pain in left foot: Secondary | ICD-10-CM | POA: Diagnosis not present

## 2014-06-23 DIAGNOSIS — B351 Tinea unguium: Secondary | ICD-10-CM | POA: Diagnosis not present

## 2014-06-23 DIAGNOSIS — E1151 Type 2 diabetes mellitus with diabetic peripheral angiopathy without gangrene: Secondary | ICD-10-CM | POA: Diagnosis not present

## 2014-06-23 MED ORDER — SILVER SULFADIAZINE 1 % EX CREA: 1.0000 "application " | TOPICAL_CREAM | Freq: Every day | CUTANEOUS | Status: DC

## 2014-06-23 NOTE — Patient Instructions (Signed)
Monitor for any signs/symptoms of infection. Call the office immediately if any occur or go directly to the emergency room. Call with any questions/concerns.  

## 2014-06-23 NOTE — Progress Notes (Signed)
°   Patient ID: Joseph Newman, male   DOB: 1945-08-27, 69 y.o.   MRN: [id number]   Subjective: 69 y.o.-year-old male returns the office today for painful, elongated, thickened toenails which he is unable to trim himself. Denies any redness or drainage around the nails. Also states he has calluses to both of his feet. Denies any acute changes since last appointment and no new complaints today. Denies any systemic complaints such as fevers, chills, nausea, vomiting.   Objective: AAO 3, NAD DP/PT pulses palpable 1/4, CRT less than 3 seconds Protective sensation decreased with Simms Weinstein monofilament Nails hypertrophic, dystrophic, elongated, brittle, discolored 10. There is tenderness overlying the nails 1-5 bilatreally. There is no surrounding erythema or drainage along the nail sites. Thick hyperkeratotic lesions bilateral submetatarsal 3 and right submetatarsal 5. Upon debridement of hyperkeratotic lesion left submetatarsal 3 there is underlying ulceration measuring proximal 0.6 x 0.6 cm with a granular wound base. The periwound is macerated. There is no probe to bone, undermining, tunneling. There is no surrounding erythema, ascending cellulitis, fluctuance, crepitus, malodor, drainage/purulence. Upon debridement of the lesions on the right foot there is no underlying ulceration. No other open lesions or pre-ulcerative lesions are identified. No other areas of tenderness bilateral lower extremities. No overlying edema, erythema, increased warmth. No pain with calf compression, swelling, warmth, erythema.  Assessment: Patient presents with symptomatic onychomycosis; pre-ulcerative calluses right foot; ulceration left foot  Plan: -Treatment options including alternatives, risks, complications were discussed -Nails sharply debrided 10 without complication/bleeding. -Hyperkeratotic lesion sharply debrided to the right foot without complications/bleeding. -Upon debridement of the hyperkeratotic  lesion left foot there is no underlying ulceration. The wound was debrided to healthy, bleeding, granular wound base. X-rays were obtained and reviewed the patient. There is no cortical destruction to suggest osteomyelitis and no soft tissue emphysema. Silvadene was applied followed by dry sterile dressing. Dispensed surgical shoe. Continue daily dressing changes as discussed with Silvadene which was prescribed. Monitor closely for any clinical signs or symptoms of infection and directed to call the office medially should any occur or go to the emergency room. -Discussed daily foot inspection. If there are any changes, to call the office immediately.  -Follow-up in 2 weeks or sooner if any problems are to arise to recheck the left foot wound. In the meantime, encouraged to call the office with any questions, concerns, changes symptoms.

## 2014-07-07 ENCOUNTER — Encounter: Payer: Self-pay | Admitting: Podiatry

## 2014-07-07 ENCOUNTER — Ambulatory Visit (INDEPENDENT_AMBULATORY_CARE_PROVIDER_SITE_OTHER): Payer: Medicare Other | Admitting: Podiatry

## 2014-07-07 VITALS — BP 146/75 | HR 80 | Resp 17

## 2014-07-07 DIAGNOSIS — L89891 Pressure ulcer of other site, stage 1: Secondary | ICD-10-CM

## 2014-07-07 DIAGNOSIS — L84 Corns and callosities: Secondary | ICD-10-CM | POA: Diagnosis not present

## 2014-07-07 DIAGNOSIS — L97521 Non-pressure chronic ulcer of other part of left foot limited to breakdown of skin: Secondary | ICD-10-CM

## 2014-07-07 NOTE — Progress Notes (Signed)
Patient ID: Joseph Newman, male   DOB: January 03, 1946, 69 y.o.   MRN: [id number]  Subjective: 69 year old male presents the office they for follow-up evaluation of ulceration the left foot as well as pre-ulcerative calluses to the right foot. He states that overall he is doing well and he believes the ulcerations are healed left foot. He has been continuous Silvadene dressing changes daily. Denies any drainage or purulence or any surrounding redness or red streaking. Denies any systemic complaints as fevers, chills, nausea, vomiting. No other complaints at this time in no acute changes his last appointment.  Objective: AAO 3, NAD DP/PT pulses 1/4 bilaterally, CRT less than 3 seconds Protective sensation decreased with Simms Weinstein monofilament Hyperkeratotic lesion bilateral submetatarsal 2/5. Upon debridement of the lesion left foot submetatarsal 2 there is a underlying ulceration measuring 0.3 x 0.27 m with a granular wound base is superficial. There is no surrounding erythema, ascending cellulitis, fluctuance, crepitus, drainage, malodor. Upon debridement of the lesions on the right foot the underlying skin is intact although the or pre-ulcerative. No other open lesions or pre-ulcer lesions identified bilaterally. No other areas of tenderness to bilateral lower extremities. No pain with calf compression, swelling, warmth, erythema  Assessment: 69 year old male healing ulceration left foot, pre-ulcerative calluses right foot.  Plan: -Treatment options discussed including all alternatives, risks, and complications -Right foot hyperkeratotic lesion sharply debrided 2 without complication/pain. -Left foot ulcer lesions sharply debrided to healthy, bleeding, granular wound base. -Recommended continue Silvadene dressings the left foot daily. -Continues monitoring clinical signs or symptoms of infection and directed to call the office immediately should any occur go to the ER. -Follow up in 4 weeks or  sooner if any problems are to arise. In the meantime I encouraged him to call the office with the questions, concerns, change in symptoms.

## 2014-08-04 ENCOUNTER — Ambulatory Visit (INDEPENDENT_AMBULATORY_CARE_PROVIDER_SITE_OTHER): Payer: Medicare Other | Admitting: Podiatry

## 2014-08-04 DIAGNOSIS — L84 Corns and callosities: Secondary | ICD-10-CM

## 2014-08-04 DIAGNOSIS — E1149 Type 2 diabetes mellitus with other diabetic neurological complication: Secondary | ICD-10-CM

## 2014-08-04 DIAGNOSIS — Z87898 Personal history of other specified conditions: Secondary | ICD-10-CM | POA: Diagnosis not present

## 2014-08-04 DIAGNOSIS — E1140 Type 2 diabetes mellitus with diabetic neuropathy, unspecified: Secondary | ICD-10-CM | POA: Diagnosis not present

## 2014-08-04 NOTE — Progress Notes (Signed)
Patient ID: Joseph Newman, male   DOB: [birth year], 69 y.o.   MRN: [id number]  Subjective: 69 year old male presents the office today for follow up evaluation of pre-ulcerative calluses/wound bilateral feet. He states that since last appointment he has not noticed any drainage or any redness around the callus sites. He denies any swelling. Denies any systemic complaints as fevers, chills, nausea, vomiting. He also states his nails are elongated and painful and unable to trim them himself he is asking for debridement today. No other complaints at this time.  Objective: AAO 3, NAD DP/PT pulses 1/4 bilaterally, CRT less than 3 seconds Protective sensation decreased with Simms Weinstein monofilament Hyperkeratotic lesions bilateral submetatarsal 2 and 5. Upon debridement there is no underlying ulceration, drainage or other clinical signs of infection. Lesions are pre-ulcerative. There is no swelling erythema, ascending cellulitis, fluctuance, crepitus, malodor, drainage. Nails are hypertrophic, dystrophic, brittle, discolored, elongated. There is tenderness in nails overlying 1-5 bilaterally. No synovitis or drainage. No other open lesions or pre-ulcer lesions identified bilaterally No pain with calf compression, swelling, warmth, erythema  Assessment: 69 year old male with pre-ulcerative calluses bilateral feet, symptomatic onychomycosis  Plan: -Treatment options discussed including all alternatives, risks, and complications -Hyperkeratotic lesion sharply debrided without consultation/bleeding. This time there is no underlying ulceration. -Nail sharply debrided 10 without complication/bleeding -[place] awaiting approval for diabetic shoes. Unable to get the approval. A prescription for this was given to the patient for [facility]. -Follow-up 4 weeks or sooner if any problems arise. In the meantime, encouraged to call the office with any questions, concerns, change in symptoms.   Celesta Gentile, DPM

## 2014-08-25 ENCOUNTER — Telehealth: Payer: Self-pay | Admitting: Family Medicine

## 2014-08-25 NOTE — Telephone Encounter (Signed)
Dan from [facility] states that the manufacturer for Sincor has been discontinued. Please call in something different.

## 2014-08-25 NOTE — Telephone Encounter (Signed)
They can split the niaspan and simvastatin until his next visit next week

## 2014-08-31 ENCOUNTER — Encounter: Payer: Self-pay | Admitting: Family Medicine

## 2014-08-31 ENCOUNTER — Other Ambulatory Visit: Payer: Self-pay

## 2014-08-31 ENCOUNTER — Ambulatory Visit (INDEPENDENT_AMBULATORY_CARE_PROVIDER_SITE_OTHER): Payer: Medicare Other | Admitting: Family Medicine

## 2014-08-31 VITALS — BP 154/86 | HR 98 | Temp 98.4°F | Resp 16 | Ht 76.0 in | Wt 284.9 lb

## 2014-08-31 DIAGNOSIS — E669 Obesity, unspecified: Secondary | ICD-10-CM | POA: Diagnosis not present

## 2014-08-31 DIAGNOSIS — Z9641 Presence of insulin pump (external) (internal): Secondary | ICD-10-CM | POA: Insufficient documentation

## 2014-08-31 DIAGNOSIS — N529 Male erectile dysfunction, unspecified: Secondary | ICD-10-CM | POA: Insufficient documentation

## 2014-08-31 DIAGNOSIS — I10 Essential (primary) hypertension: Secondary | ICD-10-CM

## 2014-08-31 DIAGNOSIS — R809 Proteinuria, unspecified: Secondary | ICD-10-CM | POA: Diagnosis not present

## 2014-08-31 DIAGNOSIS — E785 Hyperlipidemia, unspecified: Secondary | ICD-10-CM | POA: Diagnosis not present

## 2014-08-31 DIAGNOSIS — E1129 Type 2 diabetes mellitus with other diabetic kidney complication: Secondary | ICD-10-CM | POA: Insufficient documentation

## 2014-08-31 DIAGNOSIS — J42 Unspecified chronic bronchitis: Secondary | ICD-10-CM

## 2014-08-31 DIAGNOSIS — E119 Type 2 diabetes mellitus without complications: Secondary | ICD-10-CM

## 2014-08-31 DIAGNOSIS — Z720 Tobacco use: Secondary | ICD-10-CM | POA: Diagnosis not present

## 2014-08-31 DIAGNOSIS — R202 Paresthesia of skin: Secondary | ICD-10-CM | POA: Diagnosis not present

## 2014-08-31 DIAGNOSIS — J449 Chronic obstructive pulmonary disease, unspecified: Secondary | ICD-10-CM | POA: Insufficient documentation

## 2014-08-31 MED ORDER — AMLODIPINE BESYLATE 10 MG PO TABS: 10.0000 mg | ORAL_TABLET | Freq: Every day | ORAL | Status: DC

## 2014-08-31 NOTE — Progress Notes (Signed)
Name: Joseph Newman   MRN: [id number]    DOB: [birth year]   Date:08/31/2014       Progress Note  Subjective  Chief Complaint  Chief Complaint  Patient presents with  . Diabetes    checks BG bid low-95 high-150  . Hypertension  . Hyperlipidemia    HPI  HTN: he has been compliant with his medications and denies side effects, he checks his bp at home very seldom and does not recall the readings. He denies chest pain.  Hyperlipidemia: he is taking Simcor and denies side effects.   COPD: he refuses to have a spirometry or to start new medications, he is not very physically active and still smokes but is not willing to quit at this time. He coughs in am, and the sputum is usually clear.   DM: he sees Dr. Eddie Dibbles, on insulin pump, and per patient he has been more compliant with his diet.  He will have labs done next week at her office.  He denies polyphagia, polydipsia but he has polyuria - but it has improved.  He has a history of microalbuminuria and is taking Telmisartan, also has numbness, tingling on both hands that is constant.  Sees podiatrist every few months.   Patient Active Problem List   Diagnosis Date Noted  . Dyslipidemia 08/31/2014  . Tobacco abuse 08/31/2014  . Diabetes mellitus with renal manifestation 08/31/2014  . Insulin pump in place 08/31/2014  . COPD (chronic obstructive pulmonary disease) 08/31/2014  . Microalbuminuria 08/31/2014  . Obesity (BMI 30.0-34.9) 08/31/2014  . Hypertension   . Preop cardiovascular exam 10/09/2011  . Dyspnea 10/09/2011    Past Surgical History  Procedure Laterality Date  . Foot surgery      Family History  Problem Relation Age of Onset  . Hypertension Mother     History   Social History  . Marital Status: Married    Spouse Name: N/A  . Number of Children: N/A  . Years of Education: N/A   Occupational History  . Not on file.   Social History Main Topics  . Smoking status: Current Every Day Smoker -- 1.00 packs/day for 30  years    Types: Cigarettes, Cigars    Last Attempt to Quit: 02/25/2003  . Smokeless tobacco: Never Used  . Alcohol Use: 0.0 oz/week    0 Standard drinks or equivalent per week     Comment: occasional  . Drug Use: No  . Sexual Activity: Not Currently   Other Topics Concern  . Not on file   Social History Narrative     Current outpatient prescriptions:  .  amLODipine (NORVASC) 5 MG tablet, Take 1 tablet (5 mg total) by mouth daily., Disp: 180 tablet, Rfl: 3 .  aspirin EC 81 MG tablet, Take 81 mg by mouth daily., Disp: , Rfl:  .  Blood Glucose Monitoring Suppl (ONE TOUCH ULTRA SYSTEM KIT) W/DEVICE KIT, 1 kit by Does not apply route once., Disp: , Rfl:  .  Cholecalciferol (VITAMIN D) 2000 UNITS CAPS, Take by mouth., Disp: , Rfl:  .  insulin aspart (NOVOLOG FLEXPEN) 100 UNIT/ML injection, Inject into the skin 3 (three) times daily before meals., Disp: , Rfl:  .  Insulin Pen Needle 31G X 6 MM MISC, by Does not apply route., Disp: , Rfl:  .  metFORMIN (GLUCOPHAGE) 1000 MG tablet, Take 1 tablet by mouth 2 (two) times daily., Disp: , Rfl:  .  NEEDLE, DISP, 22 G (B-D DISP NEEDLE 22GX3/4") 22G X  3/4" MISC, by Does not apply route., Disp: , Rfl:  .  Needles & Syringes MISC, by Does not apply route., Disp: , Rfl:  .  niacin-simvastatin (SIMCOR) 1000-20 MG 24 hr tablet, Take 1 tablet by mouth at bedtime., Disp: , Rfl:  .  ONE TOUCH LANCETS MISC, by Does not apply route., Disp: , Rfl:  .  telmisartan-hydrochlorothiazide (MICARDIS HCT) 80-25 MG per tablet, Take 1 tablet by mouth daily., Disp: 30 tablet, Rfl: 6  No Known Allergies   ROS  Constitutional: Negative for fever positive for  weight loss - 9 lbs since last visit.  Respiratory: positive for cough and shortness of breath.   Cardiovascular: Negative for chest pain or palpitations.  Gastrointestinal: Negative for abdominal pain, no bowel changes.  Musculoskeletal: Negative for gait problem or joint swelling.  Skin: Negative for rash.   Neurological: Negative for dizziness or headache.  No other specific complaints in a complete review of systems (except as listed in HPI above).  Objective  Filed Vitals:   08/31/14 0816  BP: 142/78  Pulse: 98  Temp: 98.4 F (36.9 C)  TempSrc: Oral  Resp: 16  Height: _0  (1.93 m)  Weight: 284 lb 14.4 oz (129.23 kg)  SpO2: 96%    Body mass index is 34.69 kg/(m^2).  Physical Exam Constitutional: Patient appears well-developed and well-nourished. Obese  No distress.  HEENT: head atraumatic, normocephalic, pupils equal and reactive to light,  neck supple, throat within normal limits Cardiovascular: Normal rate, regular rhythm and normal heart sounds.  No murmur heard. No BLE edema. Pulmonary/Chest: Effort normal and breath sounds normal. No respiratory distress. Abdominal: Soft.  There is no tenderness. Psychiatric: Patient has a normal mood and affect. behavior is normal. Judgment and thought content normal.   PHQ2/9: Depression screen PHQ 2/9 08/31/2014  Decreased Interest 0  Down, Depressed, Hopeless 0  PHQ - 2 Score 0     Fall Risk: Fall Risk  08/31/2014  Falls in the past year? No      Assessment & Plan  1. Essential hypertension Elevated on the past visits, and went higher when rechecked today, we will increase dose of norvasc since he does not want to add a new medication, monitor for dizziness when standing up and call us back if any problems - Comprehensive Metabolic Panel (CMET)  2. Dyslipidemia  - Lipid Profile  3. Type 2 diabetes mellitus without complication Continue follow up with Dr. Eddie Dibbles , has an appointment next week  4. Chronic bronchitis, unspecified chronic bronchitis type Discussed getting spirometry, quitting smoking and starting on medication but he refuses  5. Tobacco abuse Not ready to quit  6. Insulin pump in place compliant  7. Microalbuminuria He will have it monitored by Dr. Eddie Dibbles  8. Obesity (BMI 30.0-34.9) Losing weight,  eating more salads, continue the good work  9. Paresthesia It may be secondary to DM, but we will check B12 level also - Vitamin B12

## 2014-09-01 ENCOUNTER — Ambulatory Visit: Payer: Medicare Other | Admitting: Podiatry

## 2014-09-08 ENCOUNTER — Ambulatory Visit (INDEPENDENT_AMBULATORY_CARE_PROVIDER_SITE_OTHER): Payer: Medicare Other | Admitting: Podiatry

## 2014-09-08 ENCOUNTER — Encounter: Payer: Self-pay | Admitting: Podiatry

## 2014-09-08 VITALS — BP 152/86 | HR 89 | Resp 18

## 2014-09-08 DIAGNOSIS — B351 Tinea unguium: Secondary | ICD-10-CM

## 2014-09-08 DIAGNOSIS — M79673 Pain in unspecified foot: Secondary | ICD-10-CM | POA: Diagnosis not present

## 2014-09-08 DIAGNOSIS — L84 Corns and callosities: Secondary | ICD-10-CM

## 2014-09-08 DIAGNOSIS — E1149 Type 2 diabetes mellitus with other diabetic neurological complication: Secondary | ICD-10-CM

## 2014-09-08 DIAGNOSIS — E1140 Type 2 diabetes mellitus with diabetic neuropathy, unspecified: Secondary | ICD-10-CM

## 2014-09-08 NOTE — Progress Notes (Signed)
Patient ID: Joseph Newman, male   DOB: 1945-06-03, 69 y.o.   MRN: [id number]  Subjective: 69 year old male presents the office today for follow up evaluation of pre-ulcerative calluses to bilateral feet. He has not noticed any drainage or any redness around the callus sites. He has noticed a small amount of swelling to the top of the left foot. Denies any redness or increase in warmth. No injury and denies any pain. Denies any systemic complaints as fevers, chills, nausea, vomiting. No other complaints at this time.  Objective: AAO 3, NAD DP/PT pulses 1/4 bilaterally, CRT less than 3 seconds Protective sensation decreased with Simms Weinstein monofilament Hyperkeratotic lesions bilateral submetatarsal 2 and 5. Upon debridement there is no underlying ulceration, drainage or other clinical signs of infection. Lesions continue to be pre-ulcerative. There is no surrounding erythema, ascending cellulitis, fluctuance, crepitus, malodor, drainage. Nails are hypertrophic, dystrophic, brittle, discolored, elongated. There is tenderness in nails overlying 1-5 bilaterally. No synovitis or drainage. No other open lesions or pre-ulcer lesions identified bilaterally No pain with calf compression, swelling, warmth, erythema  Assessment: 69 year old male with pre-ulcerative calluses bilateral feet, symptomatic onychomycosis  Plan: -Treatment options discussed including all alternatives, risks, and complications -Hyperkeratotic lesion sharply debrided without consultation/bleeding. This time there is no underlying ulceration. -Nail sharply debrided 10 without complication/bleeding -He will follow-up with [facility] to get diabetic shoes.  -Follow-up 6 weeks or sooner if any problems arise. In the meantime, encouraged to call the office with any questions, concerns, change in symptoms.   Celesta Gentile, DPM

## 2014-09-15 ENCOUNTER — Other Ambulatory Visit: Payer: Self-pay

## 2014-09-15 ENCOUNTER — Telehealth: Payer: Self-pay | Admitting: Family Medicine

## 2014-09-15 MED ORDER — ATORVASTATIN CALCIUM 40 MG PO TABS: 40.0000 mg | ORAL_TABLET | Freq: Every day | ORAL | Status: DC

## 2014-09-15 NOTE — Telephone Encounter (Signed)
Ro from [facility] states that patient was prescribed Simcor, however; it was on backorder. He was given permission to give the simvastatin and the nyasin seperately but was only allowed to give 7 pills. Now the patient is back at the pharmacy requesting more pills. Ro is asking for a new prescription for the Simvastatin and the nyasin seperately because they are still out of the Simcor. Please return call if you do not understand this message [phone]

## 2014-09-15 NOTE — Telephone Encounter (Signed)
Changed to Atorvastatin, stop simcor

## 2014-09-18 ENCOUNTER — Telehealth: Payer: Self-pay | Admitting: Podiatry

## 2014-09-18 NOTE — Telephone Encounter (Addendum)
I spoke with pt he denies any injury, fever or localized fever, or drainage from the left foot, but was told by Dr. Jacqualyn Posey to call with the status.  I told pt to rest, elevate and he would be contacted with information from Dr. Jacqualyn Posey.  I spoke with pt, he states the foot is still swollen, but not any worse and not painful.  I transferred pt to schedulers.

## 2014-09-18 NOTE — Telephone Encounter (Signed)
Pt called letting Dr. Jacqualyn Posey know his LT foot is swolling up about for a few weeks and would like to know what should be done.?

## 2014-09-18 NOTE — Telephone Encounter (Signed)
If not improved come in on Monday or go to the ER if there are any signs/symtoms of infection.

## 2014-09-22 ENCOUNTER — Ambulatory Visit (INDEPENDENT_AMBULATORY_CARE_PROVIDER_SITE_OTHER): Payer: Medicare Other | Admitting: Podiatry

## 2014-09-22 ENCOUNTER — Encounter: Payer: Self-pay | Admitting: Podiatry

## 2014-09-22 VITALS — BP 133/83 | HR 93 | Resp 18

## 2014-09-22 DIAGNOSIS — M7989 Other specified soft tissue disorders: Secondary | ICD-10-CM

## 2014-09-22 DIAGNOSIS — L84 Corns and callosities: Secondary | ICD-10-CM

## 2014-09-22 NOTE — Progress Notes (Signed)
Patient ID: Joseph Newman, male   DOB: Aug 04, 1945, 69 y.o.   MRN: [id number]  Subjective: 69 year old male presents the office today for concerns of swelling to his left leg which has been ongoing for the last several months. He denies any pain associated with the swelling and denies any redness or open sores. No injury to the area. No prior treatment. Denies any systemic complaints as fevers, chills, nausea, vomiting. No other complaints at this time.  Objective: AAO 3, NAD DP/PT pulses 1/4 bilaterally, CRT less than 3 seconds Protective sensation decreased with Simms Weinstein monofilament Hyperkeratotic lesions bilateral submetatarsal 2 and 5. Upon debridement there is no underlying ulceration, drainage or other clinical signs of infection. There is no surrounding erythema, ascending cellulitis, fluctuance, crepitus, malodor, drainage. There is swelling to the left leg without any associated erythema, increase in warmth, fluctuance, crepitance, malodor, drainage, or other signs of infection.  No other open lesions or pre-ulcer lesions identified bilaterally No pain with calf compression, swelling, warmth, erythema  Assessment: 69 year old male with left leg swelling, pre-ulcerative calluses.   Plan: -Treatment options discussed including all alternatives, risks, and complications -Hyperkeratotic lesion sharply debrided without consultation/bleeding. This time there is no underlying ulceration. -For the swelling, will order a venous duplex to rule out DVT, although unlikely. Rx Compression stockings.  -He will follow-up with [facility] to get diabetic shoes once swelling decreases  -Follow-up after vascular studies or sooner if any problems arise. In the meantime, encouraged to call the office with any questions, concerns, change in symptoms.   Celesta Gentile, DPM

## 2014-09-23 ENCOUNTER — Telehealth: Payer: Self-pay | Admitting: *Deleted

## 2014-09-23 DIAGNOSIS — I82402 Acute embolism and thrombosis of unspecified deep veins of left lower extremity: Secondary | ICD-10-CM

## 2014-09-23 NOTE — Telephone Encounter (Signed)
Dr. Jacqualyn Posey ordered LE doppler venous r/o DVT left leg.

## 2014-09-23 NOTE — Telephone Encounter (Signed)
Pt states he is near [place] and would like doppler there.  Orders to [facility].

## 2014-09-24 ENCOUNTER — Ambulatory Visit
Admission: RE | Admit: 2014-09-24 | Discharge: 2014-09-24 | Disposition: A | Discharge status: Home / Self Care | Payer: Medicare Other | Source: Ambulatory Visit | Attending: Podiatry | Admitting: Podiatry

## 2014-09-24 DIAGNOSIS — I82402 Acute embolism and thrombosis of unspecified deep veins of left lower extremity: Secondary | ICD-10-CM

## 2014-09-24 DIAGNOSIS — R600 Localized edema: Secondary | ICD-10-CM | POA: Insufficient documentation

## 2014-09-25 ENCOUNTER — Telehealth: Payer: Self-pay | Admitting: *Deleted

## 2014-09-25 NOTE — Telephone Encounter (Addendum)
-----   Message from Trula Slade, DPM sent at 09/25/2014 12:41 PM EDT ----- Please let him know that his venous duplex was negative for DVT. He can try a compression stocking to help with the swelling.  Left message to call for results.  Informed pt of the negative DVT results and Dr. Leigh Aurora recommendation to use compression hose and offered to write rx for and fax to [facility].  Pt states he has a pair and may try, I informed pt the easiest way to put on is to shower at night and put the hose on 1st thing in the morning before even swinging legs over the side of the bed.  Pt states understanding.

## 2014-10-20 ENCOUNTER — Encounter: Payer: Self-pay | Admitting: Podiatry

## 2014-10-20 ENCOUNTER — Ambulatory Visit (INDEPENDENT_AMBULATORY_CARE_PROVIDER_SITE_OTHER): Payer: Medicare Other | Admitting: Podiatry

## 2014-10-20 VITALS — BP 176/94 | HR 79 | Resp 18

## 2014-10-20 DIAGNOSIS — E1140 Type 2 diabetes mellitus with diabetic neuropathy, unspecified: Secondary | ICD-10-CM

## 2014-10-20 DIAGNOSIS — E1149 Type 2 diabetes mellitus with other diabetic neurological complication: Secondary | ICD-10-CM

## 2014-10-20 DIAGNOSIS — L84 Corns and callosities: Secondary | ICD-10-CM

## 2014-10-20 NOTE — Progress Notes (Signed)
Patient ID: Joseph Newman, male   DOB: 1945/03/18, 69 y.o.   MRN: [id number]  Subjective: 69 year old male presents the office today for continued care pre-ulcerative calluses to both feet as also discuss vascular studies. He states that he continues a compression sock. His swelling has decreased. He denies any redness or drainage on the pre-ulcerative calluses. Denies any systemic complaints as fevers, chills, nausea, vomiting. No other complaints at this time.  Objective: AAO 3, NAD DP/PT pulses 1/4 bilaterally, CRT less than 3 seconds Protective sensation decreased with Simms Weinstein monofilament Hyperkeratotic lesions bilateral submetatarsal 2 and 5. Upon debridement there is no underlying ulceration, drainage or other clinical signs of infection. There is no surrounding erythema, ascending cellulitis, fluctuance, crepitus, malodor, drainage. There is decreased swelling to the left leg. Thre is no surrounding erythema, increase in warmth, fluctuance, crepitance, malodor, drainage, or other signs of infection.  No other open lesions or pre-ulcer lesions identified bilaterally No pain with calf compression, swelling, warmth, erythema  Assessment: 69 year old male with resolving left leg swelling, pre-ulcerative calluses.   Plan: -Treatment options discussed including all alternatives, risks, and complications -Hyperkeratotic lesion sharply debrided without consultation/bleeding. This time there is no underlying ulceration. -Vascular studies were reviewed with the patient. There is no evidence of DVT at this time. Continue with compression stockings. -He will follow-up with [facility] to get diabetic shoes once swelling decreases  -Follow-up in 9 weeks or sooner if any problems arise. In the meantime, encouraged to call the office with any questions, concerns, change in symptoms.   Celesta Gentile, DPM

## 2014-11-04 ENCOUNTER — Ambulatory Visit (INDEPENDENT_AMBULATORY_CARE_PROVIDER_SITE_OTHER): Payer: Medicare Other | Admitting: Family Medicine

## 2014-11-04 ENCOUNTER — Encounter: Payer: Self-pay | Admitting: Family Medicine

## 2014-11-04 ENCOUNTER — Telehealth: Payer: Self-pay | Admitting: Family Medicine

## 2014-11-04 VITALS — BP 126/68 | HR 78 | Temp 98.0°F | Resp 16 | Ht 75.0 in | Wt 275.9 lb

## 2014-11-04 DIAGNOSIS — Z23 Encounter for immunization: Secondary | ICD-10-CM | POA: Diagnosis not present

## 2014-11-04 DIAGNOSIS — M79642 Pain in left hand: Secondary | ICD-10-CM

## 2014-11-04 MED ORDER — NAPROXEN 500 MG PO TABS: 500.0000 mg | ORAL_TABLET | Freq: Two times a day (BID) | ORAL | Status: DC

## 2014-11-04 MED ORDER — HYDROCODONE-ACETAMINOPHEN 5-325 MG PO TABS: 1.0000 | ORAL_TABLET | Freq: Four times a day (QID) | ORAL | Status: DC | PRN

## 2014-11-04 NOTE — Telephone Encounter (Signed)
ERRENOUS °

## 2014-11-04 NOTE — Progress Notes (Signed)
Name: Joseph Newman   MRN: [id number]    DOB: 08-18-1945   Date:11/04/2014       Progress Note  Subjective  Chief Complaint  Chief Complaint  Patient presents with  . Follow-up    urgent care yesterday  . Edema    left hand onset 6 days imrpoving and painful    HPI  Left hand pain/sweeling: he states that he was picking up pepper from his garden Friday am and on the following day he woke up with severe dorsal hand pain and swelling, no redness or increase in warmth.  He tried rubbing alcohol and another topical medication.  He states the hand go so swollen yesterday that he went to Urgent Care, but was not given any medications to take and no procedures were ordered. He states today the pain is no as intense, but still very uncomfortable with movement, pain is described as throbbing Pain can get up to 10/10. No previous history of gout, no history of DVT, no direct trauma.    Patient Active Problem List   Diagnosis Date Noted  . Dyslipidemia 08/31/2014  . Tobacco abuse 08/31/2014  . Diabetes mellitus with renal manifestation ([place]) 08/31/2014  . Insulin pump in place 08/31/2014  . COPD (chronic obstructive pulmonary disease) ([place]) 08/31/2014  . Microalbuminuria 08/31/2014  . Obesity (BMI 30.0-34.9) 08/31/2014  . ED (erectile dysfunction) 08/31/2014  . Hypertension   . Preop cardiovascular exam 10/09/2011  . Dyspnea 10/09/2011    Past Surgical History  Procedure Laterality Date  . Foot surgery      Family History  Problem Relation Age of Onset  . Hypertension Mother     Social History   Social History  . Marital Status: Married    Spouse Name: N/A  . Number of Children: N/A  . Years of Education: N/A   Occupational History  . Not on file.   Social History Main Topics  . Smoking status: Current Every Day Smoker -- 1.00 packs/day for 30 years    Types: Cigarettes, Cigars    Last Attempt to Quit: 02/25/2003  . Smokeless tobacco: Never Used  . Alcohol Use: 0.0  oz/week    0 Standard drinks or equivalent per week     Comment: occasional  . Drug Use: No  . Sexual Activity: Not Currently   Other Topics Concern  . Not on file   Social History Narrative     Current outpatient prescriptions:  .  amLODipine (NORVASC) 10 MG tablet, Take 1 tablet (10 mg total) by mouth daily., Disp: 30 tablet, Rfl: 3 .  aspirin EC 81 MG tablet, Take 81 mg by mouth daily., Disp: , Rfl:  .  atorvastatin (LIPITOR) 40 MG tablet, Take 1 tablet (40 mg total) by mouth daily., Disp: 90 tablet, Rfl: 0 .  Blood Glucose Monitoring Suppl (ONE TOUCH ULTRA SYSTEM KIT) W/DEVICE KIT, 1 kit by Does not apply route once., Disp: , Rfl:  .  Cholecalciferol (VITAMIN D) 2000 UNITS CAPS, Take by mouth., Disp: , Rfl:  .  Cyanocobalamin (VITAMIN B-12) 1000 MCG SUBL, Take 1 tablet by mouth daily., Disp: , Rfl:  .  HYDROcodone-acetaminophen (NORCO/VICODIN) 5-325 MG tablet, Take 1 tablet by mouth every 6 (six) hours as needed for moderate pain., Disp: 20 tablet, Rfl: 0 .  insulin aspart (NOVOLOG FLEXPEN) 100 UNIT/ML injection, Inject into the skin 3 (three) times daily before meals., Disp: , Rfl:  .  Insulin Pen Needle 31G X 6 MM MISC, by Does not  apply route., Disp: , Rfl:  .  metFORMIN (GLUCOPHAGE) 1000 MG tablet, Take 1 tablet by mouth 2 (two) times daily., Disp: , Rfl:  .  naproxen (NAPROSYN) 500 MG tablet, Take 1 tablet (500 mg total) by mouth 2 (two) times daily with a meal., Disp: 30 tablet, Rfl: 0 .  NEEDLE, DISP, 22 G (B-D DISP NEEDLE 22GX3/4") 22G X 3/4" MISC, by Does not apply route., Disp: , Rfl:  .  Needles & Syringes MISC, by Does not apply route., Disp: , Rfl:  .  ONE TOUCH LANCETS MISC, by Does not apply route., Disp: , Rfl:  .  telmisartan-hydrochlorothiazide (MICARDIS HCT) 80-25 MG per tablet, Take 1 tablet by mouth daily., Disp: 30 tablet, Rfl: 6  No Known Allergies   ROS  Constitutional: Negative for fever or weight change.  Respiratory: Chronic  cough and shortness of  breath.   Cardiovascular: Negative for chest pain or palpitations.  Gastrointestinal: Negative for abdominal pain, no bowel changes.  Musculoskeletal: Negative for gait problem positive for joint swelling.  Skin: Negative for rash.  Neurological: Negative for dizziness or headache.  No other specific complaints in a complete review of systems (except as listed in HPI above).  Objective  Filed Vitals:   11/04/14 1202  BP: 126/68  Pulse: 78  Temp: 98 F (36.7 C)  TempSrc: Oral  Resp: 16  Height: _0  (1.905 m)  Weight: 275 lb 14.4 oz (125.147 kg)  SpO2: 98%    Body mass index is 34.49 kg/(m^2).  Physical Exam  Constitutional: Patient appears well-developed . Obese No distress.  HEENT: head atraumatic, normocephalic, pupils equal and reactive to light, neck supple, throat within normal limits Cardiovascular: Normal rate, regular rhythm and normal heart sounds.  No murmur heard. No BLE edema. Pulmonary/Chest: Effort normal and breath sounds normal. No respiratory distress. Abdominal: Soft.  There is no tenderness. Psychiatric: Patient has a normal mood and affect. behavior is normal. Judgment and thought content normal. Muscular Skeletal: mild edema over left wrist, no redness, pain is worse with touch and movement, pain on dorsal aspect of left hand and some on left lateral aspect of wrist, vessels do not seem to be inflamed, no cord formation on left arm  PHQ2/9: Depression screen [facility] 2/9 11/04/2014 08/31/2014  Decreased Interest 0 0  Down, Depressed, Hopeless 0 0  PHQ - 2 Score 0 0    Fall Risk: Fall Risk  11/04/2014 08/31/2014  Falls in the past year? No No    Functional Status Survey: Is the patient deaf or have difficulty hearing?: No Does the patient have difficulty seeing, even when wearing glasses/contacts?: Yes (reading glasses) Does the patient have difficulty concentrating, remembering, or making decisions?: No Does the patient have difficulty dressing or  bathing?: No Does the patient have difficulty doing errands alone such as visiting a doctor's office or shopping?: No    Assessment & Plan  1. Pain of left hand  Likely tendinitis, from overuse last week, we will treat with NSAID"s and pain medication, may use ice.  Get X-ray tomorrow if no improvement. Follow up on Friday if not better or return sooner if worsening of symptoms and we will check doppler left arm - Uric acid - CBC with Differential/Platelet - HYDROcodone-acetaminophen (NORCO/VICODIN) 5-325 MG tablet; Take 1 tablet by mouth every 6 (six) hours as needed for moderate pain.  Dispense: 20 tablet; Refill: 0 - naproxen (NAPROSYN) 500 MG tablet; Take 1 tablet (500 mg total) by mouth 2 (two)  times daily with a meal.  Dispense: 30 tablet; Refill: 0 - DG Hand Complete Left; Future  2. Needs flu shot  - Flu vaccine HIGH DOSE PF (Fluzone High dose)

## 2014-11-06 ENCOUNTER — Telehealth: Payer: Self-pay

## 2014-11-06 LAB — CBC WITH DIFFERENTIAL/PLATELET
Basophils Absolute: 0 10*3/uL (ref 0.0–0.2)
Basos: 0 %
EOS (ABSOLUTE): 0.1 10*3/uL (ref 0.0–0.4)
Eos: 1 %
Hematocrit: 45.6 % (ref 37.5–51.0)
Hemoglobin: 15.4 g/dL (ref 12.6–17.7)
Immature Grans (Abs): 0 10*3/uL (ref 0.0–0.1)
Immature Granulocytes: 0 %
Lymphocytes Absolute: 2.5 10*3/uL (ref 0.7–3.1)
Lymphs: 33 %
MCH: 29.8 pg (ref 26.6–33.0)
MCHC: 33.8 g/dL (ref 31.5–35.7)
MCV: 88 fL (ref 79–97)
Monocytes Absolute: 0.6 10*3/uL (ref 0.1–0.9)
Monocytes: 7 %
Neutrophils Absolute: 4.4 10*3/uL (ref 1.4–7.0)
Neutrophils: 59 %
Platelets: 213 10*3/uL (ref 150–379)
RBC: 5.17 x10E6/uL (ref 4.14–5.80)
RDW: 14.3 % (ref 12.3–15.4)
WBC: 7.6 10*3/uL (ref 3.4–10.8)

## 2014-11-06 LAB — URIC ACID: Uric Acid: 8.2 mg/dL (ref 3.7–8.6)

## 2014-11-06 NOTE — Progress Notes (Signed)
Patient notified of normal labs.   

## 2014-11-06 NOTE — Telephone Encounter (Signed)
Patient states hand swelling is going down

## 2014-12-01 ENCOUNTER — Ambulatory Visit (INDEPENDENT_AMBULATORY_CARE_PROVIDER_SITE_OTHER): Payer: Medicare Other | Admitting: Podiatry

## 2014-12-01 ENCOUNTER — Encounter: Payer: Self-pay | Admitting: Podiatry

## 2014-12-01 VITALS — BP 156/97 | HR 92 | Resp 18

## 2014-12-01 DIAGNOSIS — L84 Corns and callosities: Secondary | ICD-10-CM | POA: Diagnosis not present

## 2014-12-01 DIAGNOSIS — E1149 Type 2 diabetes mellitus with other diabetic neurological complication: Secondary | ICD-10-CM

## 2014-12-02 ENCOUNTER — Encounter: Payer: Self-pay | Admitting: Podiatry

## 2014-12-02 NOTE — Progress Notes (Signed)
Patient ID: Berlie Gayle, male   DOB: 07/23/1945, 69 y.o.   MRN: [id number]  Subjective: 69 year old male presents the office today for continued care pre-ulcerative calluses to both feet which he states are doing "much better".  He also states his swelling has greatly improved. He denies any redness or drainage around the pre-ulcerative calluses. Denies any systemic complaints as fevers, chills, nausea, vomiting. No other complaints at this time.  Objective: AAO 3, NAD DP/PT pulses 1/4 bilaterally, CRT less than 3 seconds Protective sensation decreased with Simms Weinstein monofilament Hyperkeratotic lesions bilateral submetatarsal 2 and 5. Upon debridement there is no underlying ulceration, drainage or other clinical signs of infection. There is no surrounding erythema, ascending cellulitis, fluctuance, crepitus, malodor, drainage. There is mild, but greatly improved, swelling to the left leg. Thre is no surrounding erythema, increase in warmth, fluctuance, crepitance, malodor, drainage, or other signs of infection. No other open lesions or pre-ulcer lesions identified bilaterally No pain with calf compression, swelling, warmth, erythema  Assessment: 69 year old male with symptomatic pre-ulcerative calluses.   Plan: -Treatment options discussed including all alternatives, risks, and complications -Hyperkeratotic lesion sharply debrided without complications/bleeding. This time there is no underlying ulceration. -He will follow-up with [facility] to get diabetic shoes.  -Follow-up in 6 weeks or sooner if any problems arise. In the meantime, encouraged to call the office with any questions, concerns, change in symptoms.  *Nail trim next appointment if needed.   Celesta Gentile, DPM

## 2014-12-16 LAB — HEMOGLOBIN A1C: Hgb A1c MFr Bld: 11.9 % — AB (ref 4.0–6.0)

## 2014-12-31 ENCOUNTER — Ambulatory Visit (INDEPENDENT_AMBULATORY_CARE_PROVIDER_SITE_OTHER): Payer: Medicare Other | Admitting: Family Medicine

## 2014-12-31 ENCOUNTER — Encounter: Payer: Self-pay | Admitting: Family Medicine

## 2014-12-31 VITALS — BP 138/76 | HR 93 | Temp 98.2°F | Resp 18 | Ht 75.0 in | Wt 279.4 lb

## 2014-12-31 DIAGNOSIS — N521 Erectile dysfunction due to diseases classified elsewhere: Secondary | ICD-10-CM

## 2014-12-31 DIAGNOSIS — I10 Essential (primary) hypertension: Secondary | ICD-10-CM | POA: Diagnosis not present

## 2014-12-31 DIAGNOSIS — J42 Unspecified chronic bronchitis: Secondary | ICD-10-CM | POA: Diagnosis not present

## 2014-12-31 DIAGNOSIS — E1149 Type 2 diabetes mellitus with other diabetic neurological complication: Secondary | ICD-10-CM

## 2014-12-31 DIAGNOSIS — E785 Hyperlipidemia, unspecified: Secondary | ICD-10-CM

## 2014-12-31 DIAGNOSIS — E1140 Type 2 diabetes mellitus with diabetic neuropathy, unspecified: Secondary | ICD-10-CM

## 2014-12-31 LAB — POCT UA - MICROALBUMIN: Microalbumin Ur, POC: 100 mg/L

## 2014-12-31 LAB — POCT GLYCOSYLATED HEMOGLOBIN (HGB A1C): HEMOGLOBIN A1C: 11.2

## 2014-12-31 MED ORDER — ATORVASTATIN CALCIUM 40 MG PO TABS: 40.0000 mg | ORAL_TABLET | Freq: Every day | ORAL | Status: DC

## 2014-12-31 NOTE — Progress Notes (Signed)
Name: Joseph Newman   MRN: [id number]    DOB: [birth year]   Date:12/31/2014       Progress Note  Subjective  Chief Complaint  Chief Complaint  Patient presents with  . Medication Refill    follow-up  . Diabetes    checks Blood glucose 2 x day low-117, high-300's  . Hypertension  . Hyperlipidemia    HPI  HTN: he has been compliant with his medications and denies side effects. He did not bring his medications and upon reviewing Dr. Sammuel Hines notes - I am not sure if he is taking 80 mg or 80/25 of Telmisartan/HCTZ. He will bring all his prescriptions on his next office visit. No chest pain or palpitation   Hyperlipidemia:he is on Atorvastatin, denies myalgias. Labs done by endo on 06/18/2014 showed HDL 22.4, LDL 57.   COPD: he refuses to have a spirometry or to start new medications, he is not very physically active and still smokes but is not willing to quit at this time. He coughs in am, and the sputum is usually clear. He states he is not sure if he has SOB because he is not physically active.   DM: he sees Dr. Eddie Dibbles, on insulin pump, and per patient he is no longer compliant with his diet.  He had hgbA1C done at Endocrinologist office and it was 11.9 on 12/16/2014.  He denies polyphagia, polydipsia but he has polyuria. He denies blurred vision.. He has microalbuminuria and is taking Telmisartan - he was referred to nephrologist by Dr. Eddie Dibbles. He also has numbness, tingling on both hands that used to be constant, now just feels cold all the time,  and also ED. He is not on medication for neuropathy.   Patient Active Problem List   Diagnosis Date Noted  . 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 Dyslipidemia 08/31/2014  . Tobacco abuse 08/31/2014  . Diabetes mellitus with renal manifestation ([place]) 08/31/2014  . Insulin pump in place 08/31/2014  . COPD (chronic obstructive pulmonary disease) ([place]) 08/31/2014  . Microalbuminuria 08/31/2014  . Morbid  obesity ([place]) 08/31/2014  . ED (erectile dysfunction) 08/31/2014  . Hypertension   . Dyspnea 10/09/2011    Past Surgical History  Procedure Laterality Date  . Foot surgery      Family History  Problem Relation Age of Onset  . Hypertension Mother     Social History   Social History  . Marital Status: Married    Spouse Name: N/A  . Number of Children: N/A  . Years of Education: N/A   Occupational History  . Not on file.   Social History Main Topics  . Smoking status: Current Every Day Smoker -- 1.00 packs/day for 30 years    Types: Cigarettes, Cigars    Last Attempt to Quit: 02/25/2003  . Smokeless tobacco: Never Used  . Alcohol Use: 0.0 oz/week    0 Standard drinks or equivalent per week     Comment: occasional  . Drug Use: No  . Sexual Activity: Not Currently   Other Topics Concern  . Not on file   Social History Narrative     Current outpatient prescriptions:  .  aspirin EC 81 MG tablet, Take 81 mg by mouth daily., Disp: , Rfl:  .  atorvastatin (LIPITOR) 40 MG tablet, Take 1 tablet (40 mg total) by mouth daily., Disp: 90 tablet, Rfl: 1 .  Blood Glucose Monitoring Suppl (ONE TOUCH ULTRA SYSTEM KIT) W/DEVICE KIT, 1 kit by Does  not apply route once., Disp: , Rfl:  .  Cholecalciferol (VITAMIN D) 2000 UNITS CAPS, Take by mouth., Disp: , Rfl:  .  Cyanocobalamin (VITAMIN B-12) 1000 MCG SUBL, Take 1 tablet by mouth daily., Disp: , Rfl:  .  insulin aspart (NOVOLOG FLEXPEN) 100 UNIT/ML injection, Inject into the skin 3 (three) times daily before meals., Disp: , Rfl:  .  metFORMIN (GLUCOPHAGE) 1000 MG tablet, Take 1 tablet by mouth 2 (two) times daily., Disp: , Rfl:  .  NEEDLE, DISP, 22 G (B-D DISP NEEDLE 22GX3/4") 22G X 3/4" MISC, by Does not apply route., Disp: , Rfl:  .  telmisartan-hydrochlorothiazide (MICARDIS HCT) 80-25 MG per tablet, Take 1 tablet by mouth daily., Disp: 30 tablet, Rfl: 6  No Known Allergies   ROS  Constitutional: Negative for fever , mild  weight change.  Respiratory: Positive  for cough and not sure if shortness of breath - not physically active.   Cardiovascular: Negative for chest pain or palpitations.  Gastrointestinal: Negative for abdominal pain, no bowel changes.  Musculoskeletal: Negative for gait problem or joint swelling.  Skin: Negative for rash.  Neurological: Negative for dizziness or headache.  No other specific complaints in a complete review of systems (except as listed in HPI above).  Objective  Filed Vitals:   12/31/14 0808  BP: 138/76  Pulse: 93  Temp: 98.2 F (36.8 C)  TempSrc: Oral  Resp: 18  Height: _0  (1.905 m)  Weight: 279 lb 6.4 oz (126.735 kg)  SpO2: 92%    Body mass index is 34.92 kg/(m^2).  Physical Exam  Constitutional: Patient appears well-developed and  obese No distress.  HEENT: head atraumatic, normocephalic, pupils equal and reactive to light, neck supple, throat within normal limits Cardiovascular: Normal rate, regular rhythm and normal heart sounds. No murmur heard. No BLE edema. Pulmonary/Chest: Effort normal and breath sounds normal. No respiratory distress. Abdominal: Soft. There is no tenderness. Psychiatric: Patient has a normal mood and affect. behavior is normal. Judgment and thought content normal  Recent Results (from the past 2160 hour(s))  Uric acid     Status: None   Collection Time: 11/05/14  3:46 PM  Result Value Ref Range   Uric Acid 8.2 3.7 - 8.6 mg/dL    Comment:            Therapeutic target for gout patients: <6.0  CBC with Differential/Platelet     Status: None   Collection Time: 11/05/14  3:46 PM  Result Value Ref Range   WBC 7.6 3.4 - 10.8 x10E3/uL   RBC 5.17 4.14 - 5.80 x10E6/uL   Hemoglobin 15.4 12.6 - 17.7 g/dL   Hematocrit 45.6 37.5 - 51.0 %   MCV 88 79 - 97 fL   MCH 29.8 26.6 - 33.0 pg   MCHC 33.8 31.5 - 35.7 g/dL   RDW 14.3 12.3 - 15.4 %   Platelets 213 150 - 379 x10E3/uL   Neutrophils 59 %   Lymphs 33 %   Monocytes 7 %   Eos  1 %   Basos 0 %   Neutrophils Absolute 4.4 1.4 - 7.0 x10E3/uL   Lymphocytes Absolute 2.5 0.7 - 3.1 x10E3/uL   Monocytes Absolute 0.6 0.1 - 0.9 x10E3/uL   EOS (ABSOLUTE) 0.1 0.0 - 0.4 x10E3/uL   Basophils Absolute 0.0 0.0 - 0.2 x10E3/uL   Immature Granulocytes 0 %   Immature Grans (Abs) 0.0 0.0 - 0.1 x10E3/uL  POCT UA - Microalbumin     Status: Abnormal  Collection Time: 12/31/14  8:15 AM  Result Value Ref Range   Microalbumin Ur, POC 100 mg/L   Creatinine, POC  mg/dL   Albumin/Creatinine Ratio, Urine, POC    POCT HgB A1C     Status: Abnormal   Collection Time: 12/31/14  8:26 AM  Result Value Ref Range   Hemoglobin A1C 11.2      PHQ2/9: Depression screen [facility] 2/9 11/04/2014 08/31/2014  Decreased Interest 0 0  Down, Depressed, Hopeless 0 0  PHQ - 2 Score 0 0    Fall Risk: Fall Risk  11/04/2014 08/31/2014  Falls in the past year? No No    Assessment & Plan  1. Diabetic neuropathy with neurologic complication [facility])  He has proteinuria, referred to nephrologist by Dr. Eddie Dibbles - continue ARB, needs to resume diabetic and become physically active. Dr. Sammuel Hines notes states he has been taking Actos, but is not on his list of medications, advised him to call her back and verify. Continue insulin pump and aspirin daily  - POCT UA - Microalbumin - POCT HgB A1C  2. 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 Essential hypertension  Advised to contact insurance to have home health nurse go to his house and check on his medications  4. Dyslipidemia   HDL was very low in May, advised him to eat tree nuts ( pecans/pistachios/almonds ) four times weekly, eat fish two times weekly  and exercise  at least 150 minutes per week - atorvastatin (LIPITOR) 40 MG tablet; Take 1 tablet (40 mg total) by mouth daily.  Dispense: 90 tablet; Refill: 1  5. Chronic bronchitis, unspecified chronic bronchitis type ([place])  Refuses to quit  smoking or to have a spirometry or start on inhalers. Advised to walk for 10 minutes three times daily   6. Morbid obesity, unspecified obesity type [facility])  Discussed with the patient the risk posed by an increased BMI. Discussed importance of portion control, calorie counting and at least 150 minutes of physical activity weekly. Avoid sweet beverages and drink more water. Eat at least 6 servings of fruit and vegetables daily

## 2015-01-12 ENCOUNTER — Encounter: Payer: Self-pay | Admitting: Podiatry

## 2015-01-12 ENCOUNTER — Ambulatory Visit (INDEPENDENT_AMBULATORY_CARE_PROVIDER_SITE_OTHER): Payer: Medicare Other | Admitting: Podiatry

## 2015-01-12 DIAGNOSIS — L97521 Non-pressure chronic ulcer of other part of left foot limited to breakdown of skin: Secondary | ICD-10-CM | POA: Diagnosis not present

## 2015-01-12 DIAGNOSIS — E1149 Type 2 diabetes mellitus with other diabetic neurological complication: Secondary | ICD-10-CM | POA: Diagnosis not present

## 2015-01-12 DIAGNOSIS — M79673 Pain in unspecified foot: Secondary | ICD-10-CM | POA: Diagnosis not present

## 2015-01-12 DIAGNOSIS — L03119 Cellulitis of unspecified part of limb: Secondary | ICD-10-CM

## 2015-01-12 DIAGNOSIS — B351 Tinea unguium: Secondary | ICD-10-CM

## 2015-01-12 DIAGNOSIS — L02619 Cutaneous abscess of unspecified foot: Secondary | ICD-10-CM | POA: Diagnosis not present

## 2015-01-12 DIAGNOSIS — L84 Corns and callosities: Secondary | ICD-10-CM

## 2015-01-12 MED ORDER — SILVER SULFADIAZINE 1 % EX CREA: 1.0000 "application " | TOPICAL_CREAM | Freq: Every day | CUTANEOUS | Status: DC

## 2015-01-12 MED ORDER — CEPHALEXIN 500 MG PO CAPS: 500.0000 mg | ORAL_CAPSULE | Freq: Three times a day (TID) | ORAL | Status: DC

## 2015-01-13 NOTE — Progress Notes (Signed)
Patient ID: Joseph Newman, male   DOB: 02/23/1945, 69 y.o.   MRN: [id number]  Subjective: 69 year old male presents the office today for continued care pre-ulcerative calluses to both feet as well as Nails trimmed as they are thickened and elongated and he cannot trim them himself. He denies any redness or drainage on nail sites only calluses. No recent injury or trauma. No open sores at this time. Denies any systemic complaints as fevers, chills, nausea, vomiting. No other complaints at this time.  Objective: AAO 3, NAD DP/PT pulses 1/4 bilaterally, CRT less than 3 seconds Protective sensation decreased with Simms Weinstein monofilament Hyperkeratotic lesions bilateral submetatarsal 2 and 5.  There is also a bulla present on the medial aspect of the left submetatarsal one extending to the plantar aspect of the foot on the submetatarsal 1 callus. Upon debridement of the callus there was serosanguineous drainage expressed in this area was cultured. Upon debridement of the wound there was epidermal lysis from them blister and there appeared to be a superficial wound extending from the medial aspect of the first metatarsal head to the submetatarsal one and over on the wound. The central area submetatarsal 100 granular wound base measure/0.5 x 0.5 cm there is no probing, undermining or tunneling. The remainder of the wound appear to be superficial the outer layers of the skin removed. There is no swelling erythema, ascending cellulitis, crepitus, purulence. No malodor.  Hyperkeratotic lesions also present left subtarsal 5 and right submetatarsal 2 and 5. Upon debridement no further underlying ulceration, drainage or other signs of infection. Nails are hypertrophic, dystrophic, brittle, discolored, elongated 10. There is no swelling erythema or drainage. Tenderness to nails 1-5 bilaterally. There is no edema to the leg present. There is no warmth. There is no No pain with calf compression, swelling, warmth,  erythema. No other open lesions or pre-ulcerative lesions.  Assessment: 69 year old male with left foot ulceration, pre-ulcerative calluses  Plan: -Treatment options discussed including all alternatives, risks, and complications -Hyperkeratotic lesion sharply debrided. Upon debridement of the left second metatarsal 1 in the bulla there was underlying ulceration. Silvadene to the wound daily and a bandage. Prescribed Keflex. Monitor for any clinical signs or symptoms of infection and directed to call the office immediately should any occur or go to the ER. -Pre-ulcerative calluses debrided 3 without complications/bleeding. -Nail sharply debrided 10 without complications/bleeding.  -Follow-up as scheduled or sooner if any problems arise. In the meantime, encouraged to call the office with any questions, concerns, change in symptoms.  *ABI if not healing  Celesta Gentile, DPM

## 2015-01-19 ENCOUNTER — Encounter: Payer: Self-pay | Admitting: Podiatry

## 2015-01-19 ENCOUNTER — Ambulatory Visit (INDEPENDENT_AMBULATORY_CARE_PROVIDER_SITE_OTHER): Payer: Medicare Other | Admitting: Podiatry

## 2015-01-19 VITALS — BP 186/102 | HR 88 | Resp 18

## 2015-01-19 DIAGNOSIS — L97521 Non-pressure chronic ulcer of other part of left foot limited to breakdown of skin: Secondary | ICD-10-CM

## 2015-01-19 DIAGNOSIS — L89891 Pressure ulcer of other site, stage 1: Secondary | ICD-10-CM

## 2015-01-19 DIAGNOSIS — L97529 Non-pressure chronic ulcer of other part of left foot with unspecified severity: Secondary | ICD-10-CM | POA: Insufficient documentation

## 2015-01-19 NOTE — Progress Notes (Signed)
Patient ID: Joseph Newman, male   DOB: [birth year], 69 y.o.   MRN: [id number]  Subjective: 69 year old male presents the office today for follow-up evaluation of a wound on the left foot. He states he has continued antibiotics. He has not had any drainage or pus. Denies any redness or red streaks. He states of the areas healing nicely. Denies any systemic complaints as fevers, chills, nausea, vomiting. No other complaints at this time.  Objective: AAO 3, NAD DP/PT pulses 1/4 bilaterally, CRT less than 3 seconds Protective sensation decreased with Simms Weinstein monofilament Hyperkeratotic lesions bilateral submetatarsal 2 and 5.  Upon debridement no underlying ulceration, drainage or other signs of infection. Along the previous wound was identified last appointment on the left foot on the medial and plantar aspect of submetatarsal 1 there is evidence of mild epidermal lysis around the area which has become more hyperkeratotic tissue. Upon debridement there is only a small superficial opening submetatarsal one measuring 0.2 x 0.2 cm got any evidence of probing, undermining. There is no sign of erythema, ascending cellulitis, fluctuance, crepitus, malodor, drainage or purulence. No other clinical signs of infection. No other open lesions or pre-ulcerative lesions. No pain with calf compression, swelling, warmth, erythema.  Assessment: 69 year old male with left foot ulceration with evidence of healing.   Plan: -Treatment options discussed including all alternatives, risks, and complications -Wound debrided left foot without complications to healthy, granular, bleeding wound base. Silvadene to the wound daily and a bandage. Finish Keflex. Monitor for any clinical signs or symptoms of infection and directed to call the office immediately should any occur or go to the ER. -There appears to be healing so we'll hold off on arterial studies for now. -Follow-up as scheduled or sooner if any problems arise. In  the meantime, encouraged to call the office with any questions, concerns, change in symptoms.   Celesta Gentile, DPM

## 2015-02-02 ENCOUNTER — Ambulatory Visit: Payer: Medicare Other | Admitting: Podiatry

## 2015-02-11 ENCOUNTER — Encounter: Payer: Self-pay | Admitting: Podiatry

## 2015-02-11 ENCOUNTER — Ambulatory Visit (INDEPENDENT_AMBULATORY_CARE_PROVIDER_SITE_OTHER): Payer: Medicare Other | Admitting: Podiatry

## 2015-02-11 DIAGNOSIS — L84 Corns and callosities: Secondary | ICD-10-CM

## 2015-02-11 DIAGNOSIS — L97521 Non-pressure chronic ulcer of other part of left foot limited to breakdown of skin: Secondary | ICD-10-CM

## 2015-02-11 DIAGNOSIS — E1149 Type 2 diabetes mellitus with other diabetic neurological complication: Secondary | ICD-10-CM | POA: Diagnosis not present

## 2015-02-11 NOTE — Progress Notes (Signed)
Patient ID: Joseph Newman, male   DOB: 01-Jan-1946, 70 y.o.   MRN: [id number]  Subjective: 71 year old male presents the office today for follow-up evaluation of a wound on the left foot. He has finished antibiotics. He has not noticed any drainage or pus. He believes that the wound has been healing. He has been continuous change the wound daily with Silvadene. Denies any systemic complaints as fevers, chills, nausea, vomiting. No other complaints at this time.  Objective: AAO 3, NAD DP/PT pulses 1/4 bilaterally, CRT less than 3 seconds Protective sensation decreased with Simms Weinstein monofilament Hyperkeratotic lesions right submetatarsal 2 and 5.  Upon debridement no underlying ulceration, drainage or other signs of infection. On the left foot submetatarsal one is a hyperkeratotic lesion with evidence of epidermolysis on the medial aspect of the first MTPJ. Upon debridement there is no underlying ulceration, drainage or other signs of infection today. There is no edema, increase in warmth. No areas of fluctuance or crepitus. No malodor. No other open lesions or pre-ulcerative lesions. No pain with calf compression, swelling, warmth, erythema.  Assessment: 70 year old male with left foot healed ulceration  Plan: -Treatment options discussed including all alternatives, risks, and complications -hyperkeratotic lesion left foot overlying the previous ulcer was debrided to reveal no underlying ulceration today. There is no signs of infection. Hyperkeratotic lesions right foot debrided without complications or bleeding. Continue to monitor for any reoccurrence of skin breakdown. -Follow-up as scheduled or sooner if any problems arise. In the meantime, encouraged to call the office with any questions, concerns, change in symptoms.   Celesta Gentile, DPM

## 2015-03-03 ENCOUNTER — Other Ambulatory Visit: Payer: Self-pay | Admitting: Family Medicine

## 2015-03-03 NOTE — Telephone Encounter (Signed)
Patient requesting refill. 

## 2015-03-11 ENCOUNTER — Encounter: Payer: Self-pay | Admitting: Podiatry

## 2015-03-11 ENCOUNTER — Ambulatory Visit (INDEPENDENT_AMBULATORY_CARE_PROVIDER_SITE_OTHER): Payer: Medicare Other | Admitting: Podiatry

## 2015-03-11 DIAGNOSIS — E1149 Type 2 diabetes mellitus with other diabetic neurological complication: Secondary | ICD-10-CM | POA: Diagnosis not present

## 2015-03-11 DIAGNOSIS — L84 Corns and callosities: Secondary | ICD-10-CM

## 2015-03-12 NOTE — Progress Notes (Signed)
Patient ID: Joseph Newman, male   DOB: 1945/11/18, 69 y.o.   MRN: [id number]  Subjective: 70 year old male presents the office today for follow-up evaluation of pre-ulcerative calluses. He has not noticed any drainage or pus.  Denies any systemic complaints as fevers, chills, nausea, vomiting. No other complaints at this time.  Objective: AAO 3, NAD DP/PT pulses 1/4 bilaterally, CRT less than 3 seconds Protective sensation decreased with Simms Weinstein monofilament Hyperkeratotic lesions right submetatarsal 2 and 5.  Upon debridement no underlying ulceration, drainage or other signs of infection.there is no open lesions identified to his appointment. There is no drainage or pus. No other open lesions or pre-ulcerative lesions. No pain with calf compression, swelling, warmth, erythema.  Assessment: 69 year old male with pre-ulcerative lesions.  Plan: -Treatment options discussed including all alternatives, risks, and complications -Lesions debrided 4 without complications or bleeding. No underlying ulceration today. Monitor for any skin breakdown. -Follow-up as scheduled or sooner if any problems arise. In the meantime, encouraged to call the office with any questions, concerns, change in symptoms.   Celesta Gentile, DPM

## 2015-03-28 ENCOUNTER — Emergency Department
Admission: EM | Admit: 2015-03-28 | Discharge: 2015-03-28 | Disposition: A | Discharge status: Home / Self Care | Payer: Medicare Other | Attending: Emergency Medicine | Admitting: Emergency Medicine

## 2015-03-28 ENCOUNTER — Encounter: Payer: Self-pay | Admitting: Emergency Medicine

## 2015-03-28 DIAGNOSIS — Z792 Long term (current) use of antibiotics: Secondary | ICD-10-CM | POA: Diagnosis not present

## 2015-03-28 DIAGNOSIS — E11649 Type 2 diabetes mellitus with hypoglycemia without coma: Secondary | ICD-10-CM | POA: Insufficient documentation

## 2015-03-28 DIAGNOSIS — Z79899 Other long term (current) drug therapy: Secondary | ICD-10-CM | POA: Insufficient documentation

## 2015-03-28 DIAGNOSIS — Z7982 Long term (current) use of aspirin: Secondary | ICD-10-CM | POA: Diagnosis not present

## 2015-03-28 DIAGNOSIS — F17210 Nicotine dependence, cigarettes, uncomplicated: Secondary | ICD-10-CM | POA: Insufficient documentation

## 2015-03-28 DIAGNOSIS — Z794 Long term (current) use of insulin: Secondary | ICD-10-CM | POA: Insufficient documentation

## 2015-03-28 DIAGNOSIS — E162 Hypoglycemia, unspecified: Secondary | ICD-10-CM

## 2015-03-28 DIAGNOSIS — Z7984 Long term (current) use of oral hypoglycemic drugs: Secondary | ICD-10-CM | POA: Diagnosis not present

## 2015-03-28 DIAGNOSIS — E1149 Type 2 diabetes mellitus with other diabetic neurological complication: Secondary | ICD-10-CM | POA: Insufficient documentation

## 2015-03-28 LAB — COMPREHENSIVE METABOLIC PANEL
ALBUMIN: 3.4 g/dL — AB (ref 3.5–5.0)
ALT: 7 U/L — AB (ref 17–63)
AST: 16 U/L (ref 15–41)
Alkaline Phosphatase: 80 U/L (ref 38–126)
Anion gap: 8 (ref 5–15)
BUN: 22 mg/dL — AB (ref 6–20)
CHLORIDE: 105 mmol/L (ref 101–111)
CO2: 25 mmol/L (ref 22–32)
CREATININE: 1.29 mg/dL — AB (ref 0.61–1.24)
Calcium: 9 mg/dL (ref 8.9–10.3)
GFR calc Af Amer: >60 mL/min (ref >60)
GFR, EST NON AFRICAN AMERICAN: 54 mL/min — AB (ref >60)
Glucose, Bld: 233 mg/dL — ABNORMAL HIGH (ref 65–99)
POTASSIUM: 4.5 mmol/L (ref 3.5–5.1)
SODIUM: 138 mmol/L (ref 135–145)
Total Bilirubin: 0.9 mg/dL (ref 0.3–1.2)
Total Protein: 6.9 g/dL (ref 6.5–8.1)

## 2015-03-28 LAB — CBC
HCT: 47.3 % (ref 40.0–52.0)
Hemoglobin: 16.1 g/dL (ref 13.0–18.0)
MCH: 28.5 pg (ref 26.0–34.0)
MCHC: 34.1 g/dL (ref 32.0–36.0)
MCV: 83.4 fL (ref 80.0–100.0)
PLATELETS: 213 10*3/uL (ref 150–440)
RBC: 5.67 MIL/uL (ref 4.40–5.90)
RDW: 15.5 % — AB (ref 11.5–14.5)
WBC: 8.6 10*3/uL (ref 3.8–10.6)

## 2015-03-28 LAB — TROPONIN I

## 2015-03-28 LAB — GLUCOSE, CAPILLARY
Glucose-Capillary: 183 mg/dL — ABNORMAL HIGH (ref 65–99)
Glucose-Capillary: 278 mg/dL — ABNORMAL HIGH (ref 65–99)

## 2015-03-28 NOTE — Discharge Instructions (Signed)

## 2015-03-28 NOTE — ED Provider Notes (Signed)
[facility] Emergency Department Provider Note  ____________________________________________    I have reviewed the triage vital signs and the nursing notes.   HISTORY  Chief Complaint Hypoglycemia    HPI Joseph Newman is a 70 y.o. male with a history of diabetes who presents with decreased mental status. Apparently his wife found him nearly unresponsive. Upon EMS arrival blood glucose was 40. Patient was given 1 amp of D50 and his sugar improved. He was unable to eat to maintain his sugars. Patient does use an insulin patch/pump. He feels well in the emergency department has no complaints he is anxious to go home. He does take metformin but he does not take a sulfonylurea. He has no cough or fever or dysuria or abdominal pain     Past Medical History  Diagnosis Date  . Plantar wart   . Hyperlipidemia   . Type II or unspecified type diabetes mellitus without mention of complication, uncontrolled   . Obesity, unspecified   . Microalbuminuria   . Chronic airway obstruction, not elsewhere classified   . Depressive disorder, not elsewhere classified   . Impotence of organic origin   . 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 Hypertrophy of prostate without urinary obstruction and other lower urinary tract symptoms (LUTS)   . Synovitis and tenosynovitis, unspecified   . Unspecified venous (peripheral) insufficiency   . Hypertension     Patient Active Problem List   Diagnosis Date Noted  . Foot ulcer, left ([place]) 01/19/2015  . 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 Dyslipidemia 08/31/2014  . Tobacco abuse 08/31/2014  . Diabetes mellitus with renal manifestation ([place]) 08/31/2014  . Insulin pump in place 08/31/2014  . COPD (chronic obstructive pulmonary disease) ([place]) 08/31/2014  . Microalbuminuria 08/31/2014  . Morbid obesity ([place]) 08/31/2014  . ED (erectile dysfunction) 08/31/2014  . Hypertension   .  Dyspnea 10/09/2011    Past Surgical History  Procedure Laterality Date  . Foot surgery      Current Outpatient Rx  Name  Route  Sig  Dispense  Refill  . aspirin EC 81 MG tablet   Oral   Take 81 mg by mouth daily.         Marland Kitchen atorvastatin (LIPITOR) 40 MG tablet   Oral   Take 1 tablet (40 mg total) by mouth daily.   90 tablet   1     In place of Simcor, we will try Lipitor no niaspan   . Blood Glucose Monitoring Suppl (ONE TOUCH ULTRA SYSTEM KIT) W/DEVICE KIT   Does not apply   1 kit by Does not apply route once.         . cephALEXin (KEFLEX) 500 MG capsule   Oral   Take 1 capsule (500 mg total) by mouth 3 (three) times daily.   30 capsule   2   . Cholecalciferol (VITAMIN D) 2000 UNITS CAPS   Oral   Take by mouth.         . Cyanocobalamin (VITAMIN B-12) 1000 MCG SUBL   Oral   Take 1 tablet by mouth daily.         . insulin aspart (NOVOLOG FLEXPEN) 100 UNIT/ML injection   Subcutaneous   Inject into the skin 3 (three) times daily before meals.         . metFORMIN (GLUCOPHAGE) 1000 MG tablet   Oral   Take 1 tablet by mouth 2 (two) times daily.         Marland Kitchen  NEEDLE, DISP, 22 G (B-D DISP NEEDLE 22GX3/4") 22G X 3/4" MISC   Does not apply   by Does not apply route.         . silver sulfADIAZINE (SILVADENE) 1 % cream   Topical   Apply 1 application topically daily.   50 g   0   . telmisartan (MICARDIS) 80 MG tablet      TAKE 1 TABLET BY MOUTH EVERY MORNING   90 tablet   1   . telmisartan-hydrochlorothiazide (MICARDIS HCT) 80-25 MG per tablet   Oral   Take 1 tablet by mouth daily.   30 tablet   6     Allergies Review of patient's allergies indicates no known allergies.  Family History  Problem Relation Age of Onset  . Hypertension Mother     Social History Social History  Substance Use Topics  . Smoking status: Current Every Day Smoker -- 1.00 packs/day for 30 years    Types: Cigarettes, Cigars    Last Attempt to Quit: 02/25/2003  .  Smokeless tobacco: Never Used  . Alcohol Use: 0.0 oz/week    0 Standard drinks or equivalent per week     Comment: occasional    Review of Systems  Constitutional: Negative for fever. Eyes: Negative for visual changes. ENT: Negative for sore throat Cardiovascular: Negative for chest pain. Respiratory: Negative for shortness of breath. Gastrointestinal: Negative for abdominal pain, vomiting and diarrhea. Genitourinary: Negative for dysuria. Musculoskeletal: Negative for back pain. Skin: Negative for rash. Neurological: Negative for headaches or focal weakness Psychiatric: No anxiety    ____________________________________________   PHYSICAL EXAM:  VITAL SIGNS: ED Triage Vitals  Enc Vitals Group     BP 03/28/15 1700 156/69 mmHg     Pulse Rate 03/28/15 1700 81     Resp 03/28/15 1846 20     Temp 03/28/15 1700 97.7 F (36.5 C)     Temp src --      SpO2 03/28/15 1700 95 %     Weight --      Height --      Head Cir --      Peak Flow --      Pain Score 03/28/15 1711 0     Pain Loc --      Pain Edu? --      Excl. in [place]? --      Constitutional: Alert and oriented. Well appearing and in no distress. Pleasant and interactive Eyes: Conjunctivae are normal.  ENT   Head: Normocephalic and atraumatic.   Mouth/Throat: Mucous membranes are moist. Cardiovascular: Normal rate, regular rhythm. Normal and symmetric distal pulses are present in all extremities. No murmurs, rubs, or gallops. Respiratory: Normal respiratory effort without tachypnea nor retractions. Breath sounds are clear and equal bilaterally.  Gastrointestinal: Soft and non-tender in all quadrants. No distention. There is no CVA tenderness. Genitourinary: deferred Musculoskeletal: Nontender with normal range of motion in all extremities. No lower extremity tenderness nor edema. Neurologic:  Normal speech and language. No gross focal neurologic deficits are appreciated. Skin:  Skin is warm, dry and intact. No  rash noted. Psychiatric: Mood and affect are normal. Patient exhibits appropriate insight and judgment.  ____________________________________________    LABS (pertinent positives/negatives)  Labs Reviewed  GLUCOSE, CAPILLARY - Abnormal; Notable for the following:    Glucose-Capillary 183 (*)    All other components within normal limits  CBC - Abnormal; Notable for the following:    RDW 15.5 (*)    All other  components within normal limits  COMPREHENSIVE METABOLIC PANEL - Abnormal; Notable for the following:    Glucose, Bld 233 (*)    BUN 22 (*)    Creatinine, Ser 1.29 (*)    Albumin 3.4 (*)    ALT 7 (*)    GFR calc non Af Amer 54 (*)    All other components within normal limits  GLUCOSE, CAPILLARY - Abnormal; Notable for the following:    Glucose-Capillary 278 (*)    All other components within normal limits  TROPONIN I  CBG MONITORING, ED    ____________________________________________   EKG  ED ECG REPORT I, Lavonia Drafts, the attending physician, personally viewed and interpreted this ECG.   Date: 03/28/2015   Rate: 55  Rhythm: sinus bradycardia  Axis: Normal  Intervals:none  ST&T Change: Occasional PVCs   ____________________________________________    RADIOLOGY I have personally reviewed any xrays that were ordered on this patient: None  ____________________________________________   PROCEDURES  Procedure(s) performed: none  Critical Care performed: none  ____________________________________________   INITIAL IMPRESSION / ASSESSMENT AND PLAN / ED COURSE  Pertinent labs & imaging results that were available during my care of the patient were reviewed by me and considered in my medical decision making (see chart for details).  Patient well-appearing and in no distress. He is already anxious to go home upon my initially seeing him. We will check labs to check renal function and white blood cell count and monitor his sugars in the emergency  department to ensure stability.  Glucose is been stable in the emergency department. Labs are unremarkable. His kidney function is better than normal for him. His family is with him and they are comfortable taking him home. They will check his sugar overnight to ensure that it stays at appropriate levels. Patient will call his endocrinologist in the morning. He is to return if any change in symptoms.  ____________________________________________   FINAL CLINICAL IMPRESSION(S) / ED DIAGNOSES  Final diagnoses:  Hypoglycemia     Lavonia Drafts, MD 03/28/15 2351

## 2015-03-28 NOTE — ED Notes (Signed)
Patient brought in by [facility] from home, Per EMS report patient was found unresponsive by wife, upon EMS arrival patients CBG was 40. Patient was given 1 Amp of D50, with blood sugar initially improving before dropping again. Patient was given an additional 0.5 amp of D50, 2 candy bars, 2 cans of pepsi, and a peanut butter sandwich. Patient has an insulin pumps, states that he last changed pump today.

## 2015-04-08 ENCOUNTER — Ambulatory Visit (INDEPENDENT_AMBULATORY_CARE_PROVIDER_SITE_OTHER): Payer: Medicare Other | Admitting: Podiatry

## 2015-04-08 ENCOUNTER — Encounter: Payer: Self-pay | Admitting: Podiatry

## 2015-04-08 DIAGNOSIS — M79673 Pain in unspecified foot: Secondary | ICD-10-CM | POA: Diagnosis not present

## 2015-04-08 DIAGNOSIS — B351 Tinea unguium: Secondary | ICD-10-CM

## 2015-04-08 DIAGNOSIS — L84 Corns and callosities: Secondary | ICD-10-CM

## 2015-04-08 DIAGNOSIS — E1149 Type 2 diabetes mellitus with other diabetic neurological complication: Secondary | ICD-10-CM

## 2015-04-09 NOTE — Progress Notes (Signed)
Patient ID: Joseph Newman, male   DOB: 07-28-1945, 70 y.o.   MRN: [id number]  Subjective: 70 year old male presents the office today for follow-up evaluation of pre-ulcerative calluses. He has not noticed any drainage or pus.  Also states his nails are elongated and painful causing irritation shoe. Denies a redness or drainage. Denies any systemic complaints as fevers, chills, nausea, vomiting. No other complaints at this time.  Objective: AAO 3, NAD DP/PT pulses 1/4 bilaterally, CRT less than 3 seconds Protective sensation decreased with Simms Weinstein monofilament Nails are hypertrophic, dystrophic, brittle, discolored, elongated 10. No surrounding redness or drainage. Tenderness nails 1-5 bilaterally. Hyperkeratotic lesions right submetatarsal 2 and 5.  Upon debridement no underlying ulceration, drainage or other signs of infection.there is no open lesions identified to his appointment. There is no drainage or pus. No other open lesions or pre-ulcerative lesions. No pain with calf compression, swelling, warmth, erythema.  Assessment: 70 year old male with pre-ulcerative lesions.  Plan: -Treatment options discussed including all alternatives, risks, and complications -Nails debris 10 without complications or bleeding. -Lesions debrided 4 without complications or bleeding. No underlying ulceration today. Monitor for any skin breakdown. -Follow-up as scheduled or sooner if any problems arise. In the meantime, encouraged to call the office with any questions, concerns, change in symptoms.   Celesta Gentile, DPM

## 2015-05-10 ENCOUNTER — Encounter: Payer: Self-pay | Admitting: Family Medicine

## 2015-05-10 ENCOUNTER — Ambulatory Visit (INDEPENDENT_AMBULATORY_CARE_PROVIDER_SITE_OTHER): Payer: Medicare Other | Admitting: Family Medicine

## 2015-05-10 VITALS — BP 142/68 | HR 89 | Temp 97.9°F | Resp 16 | Wt 272.0 lb

## 2015-05-10 DIAGNOSIS — I10 Essential (primary) hypertension: Secondary | ICD-10-CM | POA: Diagnosis not present

## 2015-05-10 DIAGNOSIS — E1140 Type 2 diabetes mellitus with diabetic neuropathy, unspecified: Secondary | ICD-10-CM

## 2015-05-10 DIAGNOSIS — J42 Unspecified chronic bronchitis: Secondary | ICD-10-CM

## 2015-05-10 DIAGNOSIS — E1149 Type 2 diabetes mellitus with other diabetic neurological complication: Secondary | ICD-10-CM

## 2015-05-10 DIAGNOSIS — E785 Hyperlipidemia, unspecified: Secondary | ICD-10-CM

## 2015-05-10 MED ORDER — AMLODIPINE-VALSARTAN-HCTZ 10-320-25 MG PO TABS: 1.0000 | ORAL_TABLET | Freq: Every day | ORAL | Status: DC

## 2015-05-10 NOTE — Progress Notes (Signed)
Name: Joseph Newman   MRN: [id number]    DOB: 11/07/1945   Date:05/10/2015       Progress Note  Subjective  Chief Complaint  Chief Complaint  Patient presents with  . Diabetes    patient is here for his 60-monthf/u. checks his blood sugar everyday. lowest: 40 then 55 & highest: 200's. patient stated he has no other sx.  . Hypertension  . Dyslipidemia  . Obesity    patient has lost 7lbs since last visit.  . Medication Refill    Coreg 6.25    HPI  HTN: he has been compliant with his medications and denies side effects. He is on Norvasc, Micardis 80 and Coreg. BP is not at goal, we will try combo medication and resume HCTZ.   Hyperlipidemia:he is on Atorvastatin, denies myalgias. Labs done by endo on 06/18/2014 showed HDL 22.4, LDL 57.   COPD: he refuses to have a spirometry or to start new medications, he is not very physically active and still smokes but is not willing to quit at this time. He coughs in am, and the sputum is usually clear. He has some SOB with mild activity.  DM: he sees Dr. PEddie Dibbles on insulin pump, and per patient he has been compliant with his diet. He had hgbA1C done at Endocrinologist office and it was 9.9  on 02/2015.  He denies polyphagia, polydipsia but he has polyuria. He denies blurred vision.. He has microalbuminuria and is taking Telmisartan - he was referred to nephrologist by Dr. PEddie Dibbles He [facility] one [facility] in [facility] Active Problem List   Diagnosis Date Noted  . Foot ulcer, left ([place] 01/19/2015  . 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 Dyslipidemia 08/31/2014  . Tobacco abuse 08/31/2014  . Diabetes mellitus with renal manifestation ([place] 08/31/2014  . Insulin pump in place 08/31/2014  . COPD (chronic obstructive pulmonary disease) ([place] 08/31/2014  . Microalbuminuria 08/31/2014  . Morbid obesity ([place] 08/31/2014  . ED (erectile dysfunction) 08/31/2014  .  Hypertension   . Dyspnea 10/09/2011    Past Surgical History  Procedure Laterality Date  . Foot surgery      Family History  Problem Relation Age of Onset  . Hypertension Mother     Social History   Social History  . Marital Status: Married    Spouse Name: N/A  . Number of Children: N/A  . Years of Education: N/A   Occupational History  . Not on file.   Social History Main Topics  . Smoking status: Current Every Day Smoker -- 1.00 packs/day for 30 years    Types: Cigarettes, Cigars    Last Attempt to Quit: 02/25/2003  . Smokeless tobacco: Never Used  . Alcohol Use: 0.0 oz/week    0 Standard drinks or equivalent per week     Comment: occasional  . Drug Use: No  . Sexual Activity: Not Currently   Other Topics Concern  . Not on file   Social History Narrative     Current outpatient prescriptions:  .  Amlodipine-Valsartan-HCTZ 10-320-25 MG TABS, Take 1 tablet by mouth daily., Disp: 90 tablet, Rfl: 1 .  aspirin EC 81 MG tablet, Take 81 mg by mouth daily., Disp: , Rfl:  .  atorvastatin (LIPITOR) 40 MG tablet, Take 1 tablet (40 mg total) by mouth daily., Disp: 90 tablet, Rfl: 1 .  Blood Glucose Monitoring Suppl (ONE TOUCH ULTRA SYSTEM KIT)  W/DEVICE KIT, 1 kit by Does not apply route once., Disp: , Rfl:  .  carvedilol (COREG) 6.25 MG tablet, , Disp: , Rfl:  .  Cholecalciferol (VITAMIN D) 2000 UNITS CAPS, Take by mouth., Disp: , Rfl:  .  Cyanocobalamin (VITAMIN B-12) 1000 MCG SUBL, Take 1 tablet by mouth daily., Disp: , Rfl:  .  insulin aspart (NOVOLOG FLEXPEN) 100 UNIT/ML injection, Inject into the skin 3 (three) times daily before meals., Disp: , Rfl:  .  metFORMIN (GLUCOPHAGE) 1000 MG tablet, Take 1 tablet by mouth 2 (two) times daily., Disp: , Rfl:  .  NEEDLE, DISP, 22 G (B-D DISP NEEDLE 22GX3/4") 22G X 3/4" MISC, by Does not apply route., Disp: , Rfl:  .  silver sulfADIAZINE (SILVADENE) 1 % cream, Apply 1 application topically daily., Disp: 50 g, Rfl: 0 .  TRULICITY  1.5 BX/0.3YB SOPN, , Disp: , Rfl:   No Known Allergies   ROS  Constitutional: Negative for fever, positive for  weight change.  Respiratory: Positive  for cough and shortness of breath.   Cardiovascular: Negative for chest pain or palpitations.  Gastrointestinal: Negative for abdominal pain, no bowel changes.  Musculoskeletal: Negative for gait problem or joint swelling.  Skin: Negative for rash.  Neurological: Negative for dizziness or headache.  No other specific complaints in a complete review of systems (except as listed in HPI above).  Objective  Filed Vitals:   05/10/15 0909  BP: 142/94  Pulse: 89  Temp: 97.9 F (36.6 C)  TempSrc: Oral  Resp: 16  Weight: 272 lb (123.378 kg)  SpO2: 95%    Body mass index is 34 kg/(m^2).  Physical Exam  Constitutional: Patient appears well-developed and well-nourished. Obese  No distress.  HEENT: head atraumatic, normocephalic, pupils equal and reactive to light,  neck supple, throat within normal limits Cardiovascular: Normal rate, regular rhythm and normal heart sounds.  No murmur heard. No BLE edema. Pulmonary/Chest: Effort normal and breath sounds normal. No respiratory distress. Abdominal: Soft.  There is no tenderness. Psychiatric: Patient has a normal mood and affect. behavior is normal. Judgment and thought content normal.  Recent Results (from the past 2160 hour(s))  Glucose, capillary     Status: Abnormal   Collection Time: 03/28/15  5:10 PM  Result Value Ref Range   Glucose-Capillary 183 (H) 65 - 99 mg/dL  CBC     Status: Abnormal   Collection Time: 03/28/15  5:19 PM  Result Value Ref Range   WBC 8.6 3.8 - 10.6 K/uL   RBC 5.67 4.40 - 5.90 MIL/uL   Hemoglobin 16.1 13.0 - 18.0 g/dL   HCT 47.3 40.0 - 52.0 %   MCV 83.4 80.0 - 100.0 fL   MCH 28.5 26.0 - 34.0 pg   MCHC 34.1 32.0 - 36.0 g/dL   RDW 15.5 (H) 11.5 - 14.5 %   Platelets 213 150 - 440 K/uL  Comprehensive metabolic panel     Status: Abnormal   Collection  Time: 03/28/15  6:28 PM  Result Value Ref Range   Sodium 138 135 - 145 mmol/L   Potassium 4.5 3.5 - 5.1 mmol/L   Chloride 105 101 - 111 mmol/L   CO2 25 22 - 32 mmol/L   Glucose, Bld 233 (H) 65 - 99 mg/dL   BUN 22 (H) 6 - 20 mg/dL   Creatinine, Ser 1.29 (H) 0.61 - 1.24 mg/dL   Calcium 9.0 8.9 - 10.3 mg/dL   Total Protein 6.9 6.5 - 8.1 g/dL  Albumin 3.4 (L) 3.5 - 5.0 g/dL   AST 16 15 - 41 U/L   ALT 7 (L) 17 - 63 U/L   Alkaline Phosphatase 80 38 - 126 U/L   Total Bilirubin 0.9 0.3 - 1.2 mg/dL   GFR calc non Af Amer 54 (L) >60 mL/min   GFR calc Af Amer >60 >60 mL/min    Comment: (NOTE) The eGFR has been calculated using the CKD EPI equation. This calculation has not been validated in all clinical situations. eGFR's persistently <60 mL/min signify possible Chronic Kidney Disease.    Anion gap 8 5 - 15  Troponin I     Status: None   Collection Time: 03/28/15  6:28 PM  Result Value Ref Range   Troponin I <0.03 <0.031 ng/mL    Comment:        NO INDICATION OF MYOCARDIAL INJURY.   Glucose, capillary     Status: Abnormal   Collection Time: 03/28/15  7:14 PM  Result Value Ref Range   Glucose-Capillary 278 (H) 65 - 99 mg/dL   PHQ2/9: Depression screen [facility] 2/9 05/10/2015 11/04/2014 08/31/2014  Decreased Interest 0 0 0  Down, Depressed, Hopeless 0 0 0  PHQ - 2 Score 0 0 0     Fall Risk: Fall Risk  05/10/2015 11/04/2014 08/31/2014  Falls in the past year? No No No     Functional Status Survey: Is the patient deaf or have difficulty hearing?: No Does the patient have difficulty seeing, even when wearing glasses/contacts?: No Does the patient have difficulty concentrating, remembering, or making decisions?: No Does the patient have difficulty walking or climbing stairs?: No Does the patient have difficulty dressing or bathing?: No Does the patient have difficulty doing errands alone such as visiting a doctor's office or shopping?: No    Assessment & Plan  1. Diabetic  neuropathy with neurologic complication ([place])  Continue follow up with Dr. Eddie Dibbles   2. Essential hypertension  - Amlodipine-Valsartan-HCTZ 10-320-25 MG TABS; Take 1 tablet by mouth daily.  Dispense: 90 tablet; Refill: 1  3. Dyslipidemia  Continue Atorvastatin   4. Morbid obesity, unspecified obesity type ([place])  Continue weight loss  5. Chronic bronchitis, unspecified chronic bronchitis type ([place])  He is not ready to quit

## 2015-05-20 ENCOUNTER — Ambulatory Visit: Payer: Medicare Other

## 2015-05-25 ENCOUNTER — Encounter: Payer: Self-pay | Admitting: Urology

## 2015-05-25 ENCOUNTER — Ambulatory Visit (INDEPENDENT_AMBULATORY_CARE_PROVIDER_SITE_OTHER): Payer: Medicare Other | Admitting: Podiatry

## 2015-05-25 ENCOUNTER — Encounter: Payer: Self-pay | Admitting: Podiatry

## 2015-05-25 ENCOUNTER — Ambulatory Visit (INDEPENDENT_AMBULATORY_CARE_PROVIDER_SITE_OTHER): Payer: Medicare Other | Admitting: Urology

## 2015-05-25 VITALS — BP 115/68 | HR 94 | Ht 77.0 in | Wt 261.8 lb

## 2015-05-25 DIAGNOSIS — R3129 Other microscopic hematuria: Secondary | ICD-10-CM

## 2015-05-25 DIAGNOSIS — L84 Corns and callosities: Secondary | ICD-10-CM

## 2015-05-25 DIAGNOSIS — B351 Tinea unguium: Secondary | ICD-10-CM

## 2015-05-25 DIAGNOSIS — M79673 Pain in unspecified foot: Secondary | ICD-10-CM

## 2015-05-25 DIAGNOSIS — E1149 Type 2 diabetes mellitus with other diabetic neurological complication: Secondary | ICD-10-CM

## 2015-05-25 LAB — MICROSCOPIC EXAMINATION: BACTERIA UA: NONE SEEN

## 2015-05-25 LAB — URINALYSIS, COMPLETE
Bilirubin, UA: NEGATIVE
GLUCOSE, UA: NEGATIVE
KETONES UA: NEGATIVE
NITRITE UA: NEGATIVE
SPEC GRAV UA: 1.025 (ref 1.005–1.030)
UUROB: 0.2 mg/dL (ref 0.2–1.0)
pH, UA: 5 (ref 5.0–7.5)

## 2015-05-25 NOTE — Progress Notes (Signed)
05/25/2015 3:52 PM   Joseph Newman 1945-11-21 [id number]  Referring provider: Steele Sizer, MD [address] [place], [place] [address]  Chief Complaint  Patient presents with  . Hematuria    referred by Dr. Eddie Dibbles    HPI: Patient is a 70 -year-old [place] American male who presents today as a referral from their PCP, Dr. Eddie Dibbles, for microscopic hematuria.  Patient was found to have microscopic hematuria on 03/09/2015 with 11-30 RBC's per high prior field and on 04/26/2015 with 3-10 RBC's/hpf.  Patient doesn't have a prior history of microscopic hematuria.    He does not have a prior history of recurrent urinary tract infections, nephrolithiasis, trauma to the genitourinary tract, BPH or malignancies of the genitourinary tract.   He does have a family medical history of nephrolithiasis.  He does not have a family history of  malignancies of the genitourinary tract or hematuria.   Today, he is having symptoms nocturia 1.  He is not having frequent urination, urgency, dysuria, nocturia, incontinence, hesitancy, intermittency, straining to urinate or a weak urinary stream.  His UA today demonstrates 3-10 RBC's/hpf.  He is not experiencing any suprapubic pain, abdominal pain or flank pain. He denies any recent fevers, chills, nausea or vomiting.   He underwent a renal ultrasound on 02/03/2015 and Dr. Elwyn Lade office and was found to have a left renal cyst measuring 2.1 cm.  Patient is a smoker with a 1/2 pack a day with cigars for the last 20 years.     PMH: Past Medical History  Diagnosis Date  . Plantar wart   . Hyperlipidemia   . Type II or unspecified type diabetes mellitus without mention of complication, uncontrolled   . Obesity, unspecified   . Microalbuminuria   . Chronic airway obstruction, not elsewhere classified   . Depressive disorder, not elsewhere classified   . Impotence of organic origin   . 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 Hypertrophy  of prostate without urinary obstruction and other lower urinary tract symptoms (LUTS)   . Synovitis and tenosynovitis, unspecified   . Unspecified venous (peripheral) insufficiency   . Hypertension   . Arthritis   . Heart murmur   . Sleep apnea     Surgical History: Past Surgical History  Procedure Laterality Date  . Foot surgery      Home Medications:    Medication List       This list is accurate as of: 05/25/15  3:52 PM.  Always use your most recent med list.               Amlodipine-Valsartan-HCTZ 10-320-25 MG Tabs  Take 1 tablet by mouth daily.     aspirin EC 81 MG tablet  Take 81 mg by mouth daily.     atorvastatin 40 MG tablet  Commonly known as:  LIPITOR  Take 1 tablet (40 mg total) by mouth daily.     B-D DISP NEEDLE 22GX3/4" 22G X 3/4" Misc  Generic drug:  NEEDLE (DISP) 22 G  by Does not apply route.     carvedilol 6.25 MG tablet  Commonly known as:  COREG     metFORMIN 1000 MG tablet  Commonly known as:  GLUCOPHAGE  Take 1 tablet by mouth 2 (two) times daily.     NOVOLOG FLEXPEN 100 UNIT/ML injection  Generic drug:  insulin aspart  Inject into the skin 3 (three) times daily before meals. Reported on 05/25/2015     ONE TOUCH  ULTRA SYSTEM KIT w/Device Kit  1 kit by Does not apply route once.     silver sulfADIAZINE 1 % cream  Commonly known as:  SILVADENE  Apply 1 application topically daily.     TRULICITY 1.5 ZO/1.0RU Sopn  Generic drug:  Dulaglutide     Vitamin B-12 1000 MCG Subl  Take 1 tablet by mouth daily.     Vitamin D 2000 units Caps  Take by mouth.        Allergies: No Known Allergies  Family History: Family History  Problem Relation Age of Onset  . Hypertension Mother   . Kidney disease Neg Hx   . Prostate cancer Neg Hx     Social History:  reports that he has been smoking Cigarettes and Cigars.  He has a 30 pack-year smoking history. He has never used smokeless tobacco. He reports that he drinks alcohol. He reports that  he does not use illicit drugs.  ROS: UROLOGY Frequent Urination?: No Hard to postpone urination?: No Burning/pain with urination?: No Get up at night to urinate?: Yes Leakage of urine?: No Urine stream starts and stops?: No Trouble starting stream?: No Do you have to strain to urinate?: No Blood in urine?: No Urinary tract infection?: No Sexually transmitted disease?: No Injury to kidneys or bladder?: No Painful intercourse?: No Weak stream?: No Erection problems?: No Penile pain?: No  Gastrointestinal Nausea?: No Vomiting?: No Indigestion/heartburn?: No Diarrhea?: No Constipation?: No  Constitutional Fever: No Night sweats?: No Weight loss?: No Fatigue?: No  Skin Skin rash/lesions?: No Itching?: No  Eyes Blurred vision?: No Double vision?: No  Ears/Nose/Throat Sore throat?: No Sinus problems?: No  Hematologic/Lymphatic Swollen glands?: No Easy bruising?: No  Cardiovascular Leg swelling?: No Chest pain?: No  Respiratory Cough?: No Shortness of breath?: No  Endocrine Excessive thirst?: No  Musculoskeletal Back pain?: No Joint pain?: No  Neurological Headaches?: No Dizziness?: No  Psychologic Depression?: No Anxiety?: No  Physical Exam: BP 115/68 mmHg  Pulse 94  Ht '6\' 5"'$  (1.956 m)  Wt 261 lb 12.8 oz (118.752 kg)  BMI 31.04 kg/m2  Constitutional: Well nourished. Alert and oriented, No acute distress. HEENT: [place] AT, moist mucus membranes. Trachea midline, no masses. Cardiovascular: No clubbing, cyanosis, or edema. Respiratory: Normal respiratory effort, no increased work of breathing. GI: Abdomen is soft, non tender, non distended, no abdominal masses. Liver and spleen not palpable.  No hernias appreciated.  Stool sample for occult testing is not indicated.   GU: No CVA tenderness.  No bladder fullness or masses.  Patient with circumcised phallus.   Urethral meatus is patent.  No penile discharge. No penile lesions or rashes. Scrotum  without lesions, cysts, rashes and/or edema.  Testicles are located scrotally bilaterally. No masses are appreciated in the testicles. Left and right epididymis are normal. Rectal: Patient with  normal sphincter tone. Anus and perineum without scarring or rashes. No rectal masses are appreciated. Prostate is approximately 60 grams, no nodules are appreciated. Seminal vesicles are normal. Skin: No rashes, bruises or suspicious lesions. Lymph: No cervical or inguinal adenopathy. Neurologic: Grossly intact, no focal deficits, moving all 4 extremities. Psychiatric: Normal mood and affect.  Laboratory Data: Lab Results  Component Value Date   WBC 8.6 03/28/2015   HGB 16.1 03/28/2015   HCT 47.3 03/28/2015   MCV 83.4 03/28/2015   PLT 213 03/28/2015    Lab Results  Component Value Date   CREATININE 1.29* 03/28/2015     Lab Results  Component Value  Date   HGBA1C 11.2 12/31/2014         Component Value Date/Time   CHOL 131 06/18/2014   HDL 23* 06/18/2014   LDLCALC 57 06/18/2014    Lab Results  Component Value Date   AST 16 03/28/2015   Lab Results  Component Value Date   ALT 7* 03/28/2015     Urinalysis Results for orders placed or performed in visit on 05/25/15  CULTURE, URINE COMPREHENSIVE  Result Value Ref Range   Urine Culture, Comprehensive Final report    Result 1 Comment   Microscopic Examination  Result Value Ref Range   WBC, UA 11-30 (A) 0 -  5 /hpf   RBC, UA 3-10 (A) 0 -  2 /hpf   Epithelial Cells (non renal) 0-10 0 - 10 /hpf   Crystals Present (A) N/A   Crystal Type Amorphous Sediment N/A   Bacteria, UA None seen None seen/Few  Urinalysis, Complete  Result Value Ref Range   Specific Gravity, UA 1.025 1.005 - 1.030   pH, UA 5.0 5.0 - 7.5   Color, UA Yellow Yellow   Appearance Ur Hazy (A) Clear   Leukocytes, UA 1+ (A) Negative   Protein, UA 3+ (A) Negative/Trace   Glucose, UA Negative Negative   Ketones, UA Negative Negative   RBC, UA 2+ (A)  Negative   Bilirubin, UA Negative Negative   Urobilinogen, Ur 0.2 0.2 - 1.0 mg/dL   Nitrite, UA Negative Negative   Microscopic Examination See below:   BUN+Creat  Result Value Ref Range   BUN 46 (H) 8 - 27 mg/dL   Creatinine, Ser 2.27 (H) 0.76 - 1.27 mg/dL   GFR calc non Af Amer 28 (L) >59 mL/min/1.73   GFR calc Af Amer 32 (L) >59 mL/min/1.73   BUN/Creatinine Ratio 20 10 - 24    Assessment & Plan:    1. Microscopic hematuria:   Explained to patient the causes of blood in the urine are as follows: stones,  BPH, UTI's, damage to the urinary tract and/or cancer.    It is explained to the patient that they will be scheduled for a CT Urogram with contrast material and that in rare instances, an allergic reaction can be serious and even life threatening with the injection of contrast material.   The patient denies any allergies to contrast, iodine and/or seafood and is taking metformin.  I have explained to the patient that they will  be scheduled for a cystoscopy in our office to evaluate their bladder.  The cystoscopy consists of passing a tube with a lens up through their urethra and into their urinary bladder.   We will inject the urethra with a lidocaine gel prior to introducing the cystoscope to help with any discomfort during the procedure.   After the procedure, they might experience blood in the urine and discomfort with urination.  This will abate after the first few voids.  I have  encouraged the patient to increase water intake  during this time.  Patient denies any allergies to lidocaine.    - Urinalysis, Complete - CULTURE, URINE COMPREHENSIVE - BUN+Creat   Return for CT Urogram report and cystoscopy.  These notes generated with voice recognition software. I apologize for typographical errors.  Zara Council, [facility] [address], [place], [place] [address] [phone]

## 2015-05-25 NOTE — Progress Notes (Signed)
Patient ID: Joseph Newman, male   DOB: 06/05/1945, 70 y.o.   MRN: [id number]   Subjective: 70 year old male presents the office today for follow-up evaluation of pre-ulcerative calluses and for painful, elongated toenails. He has not noticed any drainage or pus.  The calluses have become very thick but denies any redness or drainage. Denies a redness or drainage. Denies any systemic complaints as fevers, chills, nausea, vomiting. No other complaints at this time.  Objective: AAO 3, NAD DP/PT pulses 1/4 bilaterally, CRT less than 3 seconds Protective sensation decreased with Simms Weinstein monofilament Nails are hypertrophic, dystrophic, brittle, discolored, elongated 10. No surrounding redness or drainage. Tenderness nails 1-5 bilaterally. Hyperkeratotic lesions right submetatarsal 2 and 5 and left submetatarsal 1 and 3.  Upon debridement no underlying ulceration, drainage or other signs of infection.there is no open lesions identified to his appointment. There is no drainage or pus. No other open lesions or pre-ulcerative lesions. No pain with calf compression, swelling, warmth, erythema.  Assessment: 70 year old male with pre-ulcerative lesions x 4, symptomatic onychomcyosis   Plan: -Treatment options discussed including all alternatives, risks, and complications -Nails debris 10 without complications or bleeding. -Lesions debrided 4 without complications or bleeding. No underlying ulceration today. Monitor for any skin breakdown. -Follow-up in 9 weeks or sooner if any problems arise. In the meantime, encouraged to call the office with any questions, concerns, change in symptoms.   Celesta Gentile, DPM

## 2015-05-26 LAB — BUN+CREAT
BUN/Creatinine Ratio: 20 (ref 10–24)
BUN: 46 mg/dL — AB (ref 8–27)
Creatinine, Ser: 2.27 mg/dL — ABNORMAL HIGH (ref 0.76–1.27)
GFR calc Af Amer: 32 mL/min/{1.73_m2} — ABNORMAL LOW (ref >59)
GFR calc non Af Amer: 28 mL/min/{1.73_m2} — ABNORMAL LOW (ref >59)

## 2015-05-27 LAB — CULTURE, URINE COMPREHENSIVE

## 2015-05-30 ENCOUNTER — Telehealth: Payer: Self-pay | Admitting: Urology

## 2015-05-30 DIAGNOSIS — R3129 Other microscopic hematuria: Secondary | ICD-10-CM | POA: Insufficient documentation

## 2015-05-30 NOTE — Telephone Encounter (Signed)
Would you send my note to Dr. Holley Raring at [facility]?

## 2015-05-31 NOTE — Telephone Encounter (Signed)
Done

## 2015-06-02 ENCOUNTER — Other Ambulatory Visit: Payer: Self-pay | Admitting: Radiology

## 2015-06-02 DIAGNOSIS — R3129 Other microscopic hematuria: Secondary | ICD-10-CM

## 2015-06-04 ENCOUNTER — Ambulatory Visit
Admission: RE | Admit: 2015-06-04 | Discharge: 2015-06-04 | Disposition: A | Discharge status: Home / Self Care | Payer: Medicare Other | Source: Ambulatory Visit | Attending: Urology | Admitting: Urology

## 2015-06-04 ENCOUNTER — Ambulatory Visit: Admission: RE | Admit: 2015-06-04 | Payer: Medicare Other | Source: Ambulatory Visit

## 2015-06-04 DIAGNOSIS — R3129 Other microscopic hematuria: Secondary | ICD-10-CM | POA: Insufficient documentation

## 2015-06-04 DIAGNOSIS — N400 Enlarged prostate without lower urinary tract symptoms: Secondary | ICD-10-CM | POA: Diagnosis not present

## 2015-06-04 DIAGNOSIS — K5730 Diverticulosis of large intestine without perforation or abscess without bleeding: Secondary | ICD-10-CM | POA: Insufficient documentation

## 2015-06-04 DIAGNOSIS — K449 Diaphragmatic hernia without obstruction or gangrene: Secondary | ICD-10-CM | POA: Diagnosis not present

## 2015-06-04 DIAGNOSIS — M47896 Other spondylosis, lumbar region: Secondary | ICD-10-CM | POA: Insufficient documentation

## 2015-06-04 DIAGNOSIS — I700 Atherosclerosis of aorta: Secondary | ICD-10-CM | POA: Diagnosis not present

## 2015-06-04 DIAGNOSIS — M5136 Other intervertebral disc degeneration, lumbar region: Secondary | ICD-10-CM | POA: Insufficient documentation

## 2015-06-15 ENCOUNTER — Ambulatory Visit (INDEPENDENT_AMBULATORY_CARE_PROVIDER_SITE_OTHER): Payer: Medicare Other | Admitting: Urology

## 2015-06-15 ENCOUNTER — Encounter: Payer: Self-pay | Admitting: Urology

## 2015-06-15 VITALS — BP 133/79 | HR 99 | Ht 77.0 in | Wt 261.3 lb

## 2015-06-15 DIAGNOSIS — R3129 Other microscopic hematuria: Secondary | ICD-10-CM

## 2015-06-15 LAB — URINALYSIS, COMPLETE
BILIRUBIN UA: NEGATIVE
GLUCOSE, UA: NEGATIVE
Ketones, UA: NEGATIVE
Nitrite, UA: NEGATIVE
PH UA: 5 (ref 5.0–7.5)
Specific Gravity, UA: 1.02 (ref 1.005–1.030)
UUROB: 0.2 mg/dL (ref 0.2–1.0)

## 2015-06-15 LAB — MICROSCOPIC EXAMINATION: BACTERIA UA: NONE SEEN

## 2015-06-15 MED ORDER — LIDOCAINE HCL 2 % EX GEL
1.0000 "application " | Freq: Once | CUTANEOUS | Status: AC
  Administered 2015-06-15: 1 via URETHRAL

## 2015-06-15 MED ORDER — CIPROFLOXACIN HCL 500 MG PO TABS
500.0000 mg | ORAL_TABLET | Freq: Once | ORAL | Status: AC
  Administered 2015-06-15: 500 mg via ORAL

## 2015-06-15 NOTE — Procedures (Signed)
    Cystoscopy Procedure Note The patient presents for completion of his microscopic hematuria evaluation. A CT scan was performed and the patient had no clear etiology or expiration for hematuria.  Patient identification was confirmed, informed consent was obtained, and patient was prepped using Betadine solution.  Lidocaine jelly was administered per urethral meatus.    Preoperative abx where received prior to procedure.     Pre-Procedure: - Inspection reveals a normal caliber ureteral meatus.  Procedure: The flexible cystoscope was introduced without difficulty - No urethral strictures/lesions are present. - Enlarged prostate with a very large intravesical median lobe - Elevated bladder neck - Bilateral ureteral orifices identified - Bladder mucosa  reveals no ulcers, tumors, or lesions - No bladder stones - No trabeculation  Retroflexion shows normal mucosa and a very large intravesical median lobe   Post-Procedure: - Patient tolerated the procedure well  Discussion:  The patient has been completely evaluated first hematuria, there is no discernible cause. I went over these findings with the patient reassured him. The patient would like to follow up with [place] on an as-needed basis.

## 2015-06-17 DIAGNOSIS — E1121 Type 2 diabetes mellitus with diabetic nephropathy: Secondary | ICD-10-CM | POA: Insufficient documentation

## 2015-07-13 ENCOUNTER — Ambulatory Visit: Payer: Medicare Other | Admitting: Podiatry

## 2015-07-20 ENCOUNTER — Ambulatory Visit: Payer: Medicare Other | Admitting: Podiatry

## 2015-07-22 ENCOUNTER — Ambulatory Visit (INDEPENDENT_AMBULATORY_CARE_PROVIDER_SITE_OTHER): Payer: Medicare Other | Admitting: Podiatry

## 2015-07-22 ENCOUNTER — Encounter: Payer: Self-pay | Admitting: Podiatry

## 2015-07-22 DIAGNOSIS — B351 Tinea unguium: Secondary | ICD-10-CM | POA: Diagnosis not present

## 2015-07-22 DIAGNOSIS — M79673 Pain in unspecified foot: Secondary | ICD-10-CM | POA: Diagnosis not present

## 2015-07-22 DIAGNOSIS — E1149 Type 2 diabetes mellitus with other diabetic neurological complication: Secondary | ICD-10-CM | POA: Diagnosis not present

## 2015-07-22 DIAGNOSIS — L84 Corns and callosities: Secondary | ICD-10-CM

## 2015-07-22 NOTE — Progress Notes (Signed)
Patient ID: Lucious Artrip, male   DOB: [birth year], 70 y.o.   MRN: [id number]  Subjective: 70 year old male presents the office today for follow-up evaluation of pre-ulcerative calluses and for painful, elongated toenails. He has not noticed any drainage or pus. He has has thick calluses to his feet. No redness or drainage.  Denies any systemic complaints as fevers, chills, nausea, vomiting. No other complaints at this time.  Objective: AAO 3, NAD DP/PT pulses 1/4 bilaterally, CRT less than 3 seconds Protective sensation decreased with Simms Weinstein monofilament Nails are hypertrophic, dystrophic, brittle, discolored, elongated 10. No surrounding redness or drainage. Tenderness nails 1-5 bilaterally. Very thick hyperkeratotic lesions right submetatarsal 2 and 5 and left submetatarsal 1 and 3.  Upon debridement no underlying ulceration, drainage or other signs of infection.there is no open lesions identified to his appointment. There is no drainage or pus. No other open lesions or pre-ulcerative lesions. No pain with calf compression, swelling, warmth, erythema.  Assessment: 70 year old male with pre-ulcerative lesions x 4, symptomatic onychomcyosis   Plan: -Treatment options discussed including all alternatives, risks, and complications -Nails debris 10 without complications or bleeding. -Lesions debrided 4 without complications or bleeding. No underlying ulceration today. Monitor for any skin breakdown. -Follow-up in 9 weeks or sooner if any problems arise. In the meantime, encouraged to call the office with any questions, concerns, change in symptoms.   Celesta Gentile, DPM

## 2015-08-26 ENCOUNTER — Other Ambulatory Visit: Payer: Self-pay | Admitting: Family Medicine

## 2015-08-26 NOTE — Telephone Encounter (Signed)
Patient requesting refill. 

## 2015-10-08 ENCOUNTER — Ambulatory Visit (INDEPENDENT_AMBULATORY_CARE_PROVIDER_SITE_OTHER): Payer: Medicare Other | Admitting: Podiatry

## 2015-10-08 ENCOUNTER — Encounter: Payer: Self-pay | Admitting: Podiatry

## 2015-10-08 DIAGNOSIS — Q828 Other specified congenital malformations of skin: Secondary | ICD-10-CM

## 2015-10-08 DIAGNOSIS — I70245 Atherosclerosis of native arteries of left leg with ulceration of other part of foot: Secondary | ICD-10-CM

## 2015-10-08 DIAGNOSIS — M79676 Pain in unspecified toe(s): Secondary | ICD-10-CM

## 2015-10-08 DIAGNOSIS — I70235 Atherosclerosis of native arteries of right leg with ulceration of other part of foot: Secondary | ICD-10-CM | POA: Diagnosis not present

## 2015-10-08 DIAGNOSIS — E1149 Type 2 diabetes mellitus with other diabetic neurological complication: Secondary | ICD-10-CM

## 2015-10-08 DIAGNOSIS — L97522 Non-pressure chronic ulcer of other part of left foot with fat layer exposed: Secondary | ICD-10-CM

## 2015-10-08 DIAGNOSIS — B351 Tinea unguium: Secondary | ICD-10-CM

## 2015-10-08 DIAGNOSIS — Z87898 Personal history of other specified conditions: Secondary | ICD-10-CM

## 2015-10-08 NOTE — Patient Instructions (Signed)
Diabetes and Foot Care Diabetes may cause you to have problems because of poor blood supply (circulation) to your feet and legs. This may cause the skin on your feet to become thinner, break easier, and heal more slowly. Your skin may become dry, and the skin may peel and crack. You may also have nerve damage in your legs and feet causing decreased feeling in them. You may not notice minor injuries to your feet that could lead to infections or more serious problems. Taking care of your feet is one of the most important things you can do for yourself.  HOME CARE INSTRUCTIONS  Wear shoes at all times, even in the house. Do not go barefoot. Bare feet are easily injured.  Check your feet daily for blisters, cuts, and redness. If you cannot see the bottom of your feet, use a mirror or ask someone for help.  Wash your feet with warm water (do not use hot water) and mild soap. Then pat your feet and the areas between your toes until they are completely dry. Do not soak your feet as this can dry your skin.  Apply a moisturizing lotion or petroleum jelly (that does not contain alcohol and is unscented) to the skin on your feet and to dry, brittle toenails. Do not apply lotion between your toes.  Trim your toenails straight across. Do not dig under them or around the cuticle. File the edges of your nails with an emery board or nail file.  Do not cut corns or calluses or try to remove them with medicine.  Wear clean socks or stockings every day. Make sure they are not too tight. Do not wear knee-high stockings since they may decrease blood flow to your legs.  Wear shoes that fit properly and have enough cushioning. To break in new shoes, wear them for just a few hours a day. This prevents you from injuring your feet. Always look in your shoes before you put them on to be sure there are no objects inside.  Do not cross your legs. This may decrease the blood flow to your feet.  If you find a minor scrape,  cut, or break in the skin on your feet, keep it and the skin around it clean and dry. These areas may be cleansed with mild soap and water. Do not cleanse the area with peroxide, alcohol, or iodine.  When you remove an adhesive bandage, be sure not to damage the skin around it.  If you have a wound, look at it several times a day to make sure it is healing.  Do not use heating pads or hot water bottles. They may burn your skin. If you have lost feeling in your feet or legs, you may not know it is happening until it is too late.  Make sure your health care provider performs a complete foot exam at least annually or more often if you have foot problems. Report any cuts, sores, or bruises to your health care provider immediately. SEEK MEDICAL CARE IF:   You have an injury that is not healing.  You have cuts or breaks in the skin.  You have an ingrown nail.  You notice redness on your legs or feet.  You feel burning or tingling in your legs or feet.  You have pain or cramps in your legs and feet.  Your legs or feet are numb.  Your feet always feel cold. SEEK IMMEDIATE MEDICAL CARE IF:   There is increasing redness,   swelling, or pain in or around a wound.  There is a red line that goes up your leg.  Pus is coming from a wound.  You develop a fever or as directed by your health care provider.  You notice a bad smell coming from an ulcer or wound.   This information is not intended to replace advice given to you by your health care provider. Make sure you discuss any questions you have with your health care provider.   Document Released: 01/07/2000 Document Revised: 09/11/2012 Document Reviewed: 06/18/2012 Elsevier Interactive Patient Education 2016 [employer]  

## 2015-10-10 ENCOUNTER — Other Ambulatory Visit: Payer: Self-pay | Admitting: Family Medicine

## 2015-10-10 DIAGNOSIS — E785 Hyperlipidemia, unspecified: Secondary | ICD-10-CM

## 2015-10-13 NOTE — Progress Notes (Signed)
SUBJECTIVE Patient with a history of diabetes mellitus presents to office today complaining of elongated, thickened nails. Pain while ambulating in shoes. Patient is unable to trim their own nails.  Patient also complains of bilateral painful lesions to the bilateral forefeet. He states that he's had these lesions for many months and routine debridement has not improved symptoms. Patient noticed sanguinous drainage on his socks of both feet.  No Known Allergies  OBJECTIVE General Patient is awake, alert, and oriented x 3 and in no acute distress. Derm hard hyperkeratotic lesions noted to the second fifth sub-MPJ right foot and the first and fourth sub-MPJ left foot. Upon debridement of the hyperkeratotic lesions there is an ulcer noted to the sub-second MPJ right foot and the fourth sub-MPJ left foot. Both wounds measure 2.0 cm in diameter by 0.3 cm in depth. To the noted ulcerations there is no eschar there is a minimal amount of slough fibrin and necrotic tissue granulation tissue is present in the wound base in the wound base is red. There is no malodor there is a moderate amount of serosanguineous drainage noted. The periwound integrity is callused. There is no exposed bone muscle tendon ligament or joint.  Remaining skin is dry and supple bilateral. Nails are tender, long, thickened and dystrophic with subungual debris, consistent with onychomycosis, 1-5 bilateral. No signs of infection noted.  Vasc  DP and PT pedal pulses palpable bilaterally. Temperature gradient within normal limits.   Neuro Epicritic and protective threshold sensation diminished bilaterally.   Musculoskeletal Exam No symptomatic pedal deformities noted bilateral. Muscular strength within normal limits.  ASSESSMENT 1. Diabetes Mellitus w/ peripheral neuropathy 2. Onychomycosis of nail due to dermatophyte bilateral 3. Pain in foot bilateral 4. 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 Instructed to maintain good pedal hygiene and foot care. Medically necessary excisional debridement including subcutaneous tissues performed using a chisel blade and a tissue nipper. Excisional debridement of all necrotic nonviable tissue down to healthy bleeding viable tissue was performed with post-debridement measurements and was pre-. Wounds were cleansed and dressed with dry sterile dressings. Mechanical debridement of nails 1-5 bilaterally performed using a nail nipper. Filed with dremel without incident.  All patient questions were answered. Return to clinic in 3 weeks.    Edrick Kins, DPM

## 2015-10-29 ENCOUNTER — Ambulatory Visit (INDEPENDENT_AMBULATORY_CARE_PROVIDER_SITE_OTHER): Payer: Medicare Other | Admitting: Podiatry

## 2015-10-29 ENCOUNTER — Encounter: Payer: Self-pay | Admitting: Podiatry

## 2015-10-29 DIAGNOSIS — E1149 Type 2 diabetes mellitus with other diabetic neurological complication: Secondary | ICD-10-CM

## 2015-10-29 DIAGNOSIS — Q828 Other specified congenital malformations of skin: Secondary | ICD-10-CM | POA: Diagnosis not present

## 2015-10-29 DIAGNOSIS — M79676 Pain in unspecified toe(s): Secondary | ICD-10-CM

## 2015-10-29 DIAGNOSIS — Z87898 Personal history of other specified conditions: Secondary | ICD-10-CM

## 2015-10-29 DIAGNOSIS — L84 Corns and callosities: Secondary | ICD-10-CM | POA: Diagnosis not present

## 2015-10-29 DIAGNOSIS — B351 Tinea unguium: Secondary | ICD-10-CM

## 2015-10-30 NOTE — Progress Notes (Signed)
SUBJECTIVE Patient with a history of diabetes mellitus presents to office today for follow-up evaluation of ulcers to the bilateral feet. Patient states he does notice improvement.  OBJECTIVE General Patient is awake, alert, and oriented x 3 and in no acute distress. Derm hard hyperkeratotic lesions noted to the second fifth sub-MPJ right foot and the first and fourth sub-MPJ left foot. Upon debridement of the hyperkeratotic lesions there is evidence of wound healing with complete reepithelialization. Remaining skin is dry and supple bilateral. Nails are tender, long, thickened and dystrophic with subungual debris, consistent with onychomycosis, 1-5 bilateral. No signs of infection noted.  Vasc  DP and PT pedal pulses palpable bilaterally. Temperature gradient within normal limits.   Neuro Epicritic and protective threshold sensation diminished bilaterally.   Musculoskeletal Exam No symptomatic pedal deformities noted bilateral. Muscular strength within normal limits.  ASSESSMENT 1. Diabetes Mellitus w/ peripheral neuropathy 2. Onychomycosis of nail due to dermatophyte bilateral 3. Pain in foot bilateral 4. 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 Instructed to maintain good pedal hygiene and foot care. Hyperkeratotic skin lesions were debrided using a chisel blade and tissue nipper without incident.  Wounds were cleansed and dressed with dry sterile dressings. Mechanical debridement of nails 1-5 bilaterally performed using a nail nipper. Filed with dremel without incident.  Today a prescription for custom molded diabetic shoes and orthotics were dispensed through [facility]  All patient questions were answered. Return to clinic in 3 weeks.    Edrick Kins, DPM

## 2015-11-02 ENCOUNTER — Other Ambulatory Visit: Payer: Self-pay | Admitting: Family Medicine

## 2015-11-02 DIAGNOSIS — I10 Essential (primary) hypertension: Secondary | ICD-10-CM

## 2015-11-02 NOTE — Telephone Encounter (Signed)
Patient requesting refill of Amlod-Vas-HCTZ to [facility].

## 2015-11-10 ENCOUNTER — Encounter: Payer: Self-pay | Admitting: Family Medicine

## 2015-11-10 ENCOUNTER — Ambulatory Visit (INDEPENDENT_AMBULATORY_CARE_PROVIDER_SITE_OTHER): Payer: Medicare Other | Admitting: Family Medicine

## 2015-11-10 VITALS — BP 138/68 | HR 94 | Temp 98.4°F | Resp 16 | Ht 77.0 in | Wt 251.1 lb

## 2015-11-10 DIAGNOSIS — E1121 Type 2 diabetes mellitus with diabetic nephropathy: Secondary | ICD-10-CM

## 2015-11-10 DIAGNOSIS — E1165 Type 2 diabetes mellitus with hyperglycemia: Secondary | ICD-10-CM

## 2015-11-10 DIAGNOSIS — I10 Essential (primary) hypertension: Secondary | ICD-10-CM

## 2015-11-10 DIAGNOSIS — Z23 Encounter for immunization: Secondary | ICD-10-CM

## 2015-11-10 DIAGNOSIS — E1149 Type 2 diabetes mellitus with other diabetic neurological complication: Secondary | ICD-10-CM

## 2015-11-10 DIAGNOSIS — E785 Hyperlipidemia, unspecified: Secondary | ICD-10-CM | POA: Diagnosis not present

## 2015-11-10 DIAGNOSIS — J42 Unspecified chronic bronchitis: Secondary | ICD-10-CM | POA: Diagnosis not present

## 2015-11-10 DIAGNOSIS — E1140 Type 2 diabetes mellitus with diabetic neuropathy, unspecified: Secondary | ICD-10-CM | POA: Diagnosis not present

## 2015-11-10 DIAGNOSIS — IMO0002 Reserved for concepts with insufficient information to code with codable children: Secondary | ICD-10-CM

## 2015-11-10 NOTE — Progress Notes (Signed)
Name: Joseph Newman   MRN: [id number]    DOB: 1945-06-27   Date:11/10/2015       Progress Note  Subjective  Chief Complaint  Chief Complaint  Patient presents with  . Flu Vaccine  . Diabetes  . Hyperlipidemia  . Hypertension    HPI  HTN: he has been compliant with his medications and denies side effects. He is Exforge  and Coreg daily and bp is at goal  Hyperlipidemia:he is on Atorvastatin, denies myalgias. Labs done by endo on 05/2015 showed HDLis very low, LDL at goal. Discussed importance of increase in physical activity.   COPD: he refuses to have a spirometry but willing to try  Medications  if we can get him a samples. He is not very physically active and still smokes one pack a day, but is not willing to quit at this time. He coughs in am, and the sputum is usually clear. He has some SOB with mild activity.  DM: he sees Dr. Eddie Dibbles, off insulin pump, on Trulicity and EQAS3M is much better now 7.6%  08/2015.  He denies polyphagia, polydipsia but he has polyuria every 2 hours . He denies blurred vision. He has microalbuminuria and is taking Valsartan. He is also seeing Dr. Holley Raring. No recent episodes of hypoglycemia. FSBS is around 175 fasting.  Morbid Obesity: he has lost weight , 10 lbs since last visit, doing well on Trulicity, he said he will start exercising  Patient Active Problem List   Diagnosis Date Noted  . Microscopic hematuria 05/30/2015  . Foot ulcer, left ([place]) 01/19/2015  . 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 Dyslipidemia 08/31/2014  . Tobacco abuse 08/31/2014  . Diabetes mellitus with renal manifestation ([place]) 08/31/2014  . COPD (chronic obstructive pulmonary disease) ([place]) 08/31/2014  . Microalbuminuria 08/31/2014  . Morbid obesity ([place]) 08/31/2014  . ED (erectile dysfunction) 08/31/2014  . Hypertension   . Dyspnea 10/09/2011    Past Surgical History:  Procedure Laterality Date  . foot surgery      Family  History  Problem Relation Age of Onset  . Hypertension Mother   . Kidney disease Neg Hx   . Prostate cancer Neg Hx     Social History   Social History  . Marital status: Married    Spouse name: N/A  . Number of children: N/A  . Years of education: N/A   Occupational History  . Not on file.   Social History Main Topics  . Smoking status: Current Every Day Smoker    Packs/day: 1.00    Years: 30.00    Types: Cigarettes, Cigars    Last attempt to quit: 02/25/2003  . Smokeless tobacco: Never Used  . Alcohol use 0.0 oz/week     Comment: occasional  . Drug use: No  . Sexual activity: Not Currently   Other Topics Concern  . Not on file   Social History Narrative  . No narrative on file     Current Outpatient Prescriptions:  .  ACCU-CHEK AVIVA PLUS test strip, , Disp: , Rfl:  .  Amlodipine-Valsartan-HCTZ 10-320-25 MG TABS, TAKE 1 TABLET BY MOUTH DAILY., Disp: 90 tablet, Rfl: 1 .  aspirin EC 81 MG tablet, Take 81 mg by mouth daily., Disp: , Rfl:  .  atorvastatin (LIPITOR) 40 MG tablet, TAKE 1 TABLET (40 MG TOTAL) BY MOUTH DAILY., Disp: 90 tablet, Rfl: 1 .  Blood Glucose Monitoring Suppl (ONE TOUCH ULTRA SYSTEM KIT) W/DEVICE KIT, 1 kit by  Does not apply route once., Disp: , Rfl:  .  carvedilol (COREG) 6.25 MG tablet, , Disp: , Rfl:  .  Cholecalciferol (VITAMIN D) 2000 UNITS CAPS, Take by mouth., Disp: , Rfl:  .  Cyanocobalamin (VITAMIN B-12) 1000 MCG SUBL, Take 1 tablet by mouth daily., Disp: , Rfl:  .  metFORMIN (GLUCOPHAGE) 1000 MG tablet, Take 1 tablet by mouth 2 (two) times daily., Disp: , Rfl:  .  NEEDLE, DISP, 22 G (B-D DISP NEEDLE 22GX3/4") 22G X 3/4" MISC, by Does not apply route., Disp: , Rfl:  .  TRULICITY 1.5 QG/9.2EF SOPN, , Disp: , Rfl:   No Known Allergies   ROS  Constitutional: Negative for fever, positive for  weight change.  Respiratory: Positive  for cough and shortness of breath.   Cardiovascular: Negative for chest pain or palpitations.   Gastrointestinal: Negative for abdominal pain, no bowel changes.  Musculoskeletal: Negative for gait problem or joint swelling.  Skin: Negative for rash.  Neurological: Negative for dizziness or headache.  No other specific complaints in a complete review of systems (except as listed in HPI above).   Objective  Vitals:   11/10/15 0811 11/10/15 0827  BP: 138/68   Pulse: (!) 124 94  Resp: 16   Temp: 98.4 F (36.9 C)   TempSrc: Oral   SpO2: 96%   Weight: 251 lb 1 oz (113.9 kg)   Height: 6' 5"  (1.956 m)     Body mass index is 29.77 kg/m.  Physical Exam  Constitutional: Patient appears well-developed and well-nourished. Obese  No distress.  HEENT: head atraumatic, normocephalic, pupils equal and reactive to light, neck supple, throat within normal limits Cardiovascular: Normal rate, regular rhythm and normal heart sounds.  No murmur heard. No BLE edema. Pulmonary/Chest: Effort normal and breath sounds normal. No respiratory distress. Abdominal: Soft.  There is no tenderness. Psychiatric: Patient has a normal mood and affect. behavior is normal. Judgment and thought content normal. Muscular Skeletal: atrophy of left hand between thumb and index finger, normal neck exam, no numbness and normal rom  PHQ2/9: Depression screen [facility] 2/9 11/10/2015 05/10/2015 11/04/2014 08/31/2014  Decreased Interest 0 0 0 0  Down, Depressed, Hopeless 0 0 0 0  PHQ - 2 Score 0 0 0 0    Fall Risk: Fall Risk  11/10/2015 05/10/2015 11/04/2014 08/31/2014  Falls in the past year? No No No No    Functional Status Survey: Is the patient deaf or have difficulty hearing?: No Does the patient have difficulty seeing, even when wearing glasses/contacts?: No Does the patient have difficulty concentrating, remembering, or making decisions?: No Does the patient have difficulty walking or climbing stairs?: No Does the patient have difficulty dressing or bathing?: No Does the patient have difficulty doing errands  alone such as visiting a doctor's office or shopping?: No    Assessment & Plan  1. Diabetic neuropathy with neurologic complication [facility])  Doing well , still has ED  2. Need for influenza vaccination  - Flu vaccine HIGH DOSE PF (Fluzone High dose)  3. Essential hypertension  Well controlled  4. Dyslipidemia  Needs to change life style   5. Morbid obesity, unspecified obesity type [facility])  Discussed with the patient the risk posed by an increased BMI. Discussed importance of portion control, calorie counting and at least 150 minutes of physical activity weekly. Avoid sweet beverages and drink more water. Eat at least 6 servings of fruit and vegetables daily   6. Chronic bronchitis, unspecified chronic bronchitis  type [facility])  We will see if we can get him a sample and call him back  Needs to quit smoking  7. Uncontrolled type 2 diabetes mellitus with microalbuminuria, without long-term current use of insulin (HCC)  hgbA1C is at goal for his age, below 75

## 2016-02-01 ENCOUNTER — Encounter: Payer: Self-pay | Admitting: Podiatry

## 2016-02-01 ENCOUNTER — Ambulatory Visit (INDEPENDENT_AMBULATORY_CARE_PROVIDER_SITE_OTHER): Payer: Medicare Other | Admitting: Podiatry

## 2016-02-01 DIAGNOSIS — L84 Corns and callosities: Secondary | ICD-10-CM | POA: Diagnosis not present

## 2016-02-01 DIAGNOSIS — M79672 Pain in left foot: Secondary | ICD-10-CM | POA: Diagnosis not present

## 2016-02-01 DIAGNOSIS — E08621 Diabetes mellitus due to underlying condition with foot ulcer: Secondary | ICD-10-CM | POA: Diagnosis not present

## 2016-02-01 DIAGNOSIS — B351 Tinea unguium: Secondary | ICD-10-CM | POA: Diagnosis not present

## 2016-02-01 DIAGNOSIS — L97512 Non-pressure chronic ulcer of other part of right foot with fat layer exposed: Secondary | ICD-10-CM

## 2016-02-01 DIAGNOSIS — L603 Nail dystrophy: Secondary | ICD-10-CM

## 2016-02-01 DIAGNOSIS — M79609 Pain in unspecified limb: Secondary | ICD-10-CM

## 2016-02-01 DIAGNOSIS — I70235 Atherosclerosis of native arteries of right leg with ulceration of other part of foot: Secondary | ICD-10-CM

## 2016-02-01 DIAGNOSIS — L608 Other nail disorders: Secondary | ICD-10-CM

## 2016-02-01 DIAGNOSIS — L851 Acquired keratosis [keratoderma] palmaris et plantaris: Secondary | ICD-10-CM | POA: Diagnosis not present

## 2016-02-01 DIAGNOSIS — E1143 Type 2 diabetes mellitus with diabetic autonomic (poly)neuropathy: Secondary | ICD-10-CM

## 2016-02-01 DIAGNOSIS — L97509 Non-pressure chronic ulcer of other part of unspecified foot with unspecified severity: Secondary | ICD-10-CM

## 2016-02-01 DIAGNOSIS — E0843 Diabetes mellitus due to underlying condition with diabetic autonomic (poly)neuropathy: Secondary | ICD-10-CM

## 2016-02-01 DIAGNOSIS — M79671 Pain in right foot: Secondary | ICD-10-CM | POA: Diagnosis not present

## 2016-02-06 NOTE — Progress Notes (Signed)
Subjective:  Patient with a history of diabetes mellitus presents today for evaluation ulceration(s) to the lower extremitie(s). Patient states that he did have a history of diabetes mellitus. Patient also presents for painful, thickened, elongated toenails. Patient states they hurt with ambulation and shoe gear. Patient also has painful callus lesions to the bilateral forefoot. Patient presents today for further treatment and evaluation   Objective/Physical Exam General: The patient is alert and oriented x3 in no acute distress.  Dermatology:  Wound #1 noted to the weightbearing surface of the second MPJ right foot measuring 0.50.50 point cm (LxWxD).   To the noted ulceration(s), there is no eschar. There is a moderate amount of slough, fibrin, and necrotic tissue noted. Granulation tissue and wound base is red. There is a minimal amount of serosanguineous drainage noted. There is no exposed bone muscle-tendon ligament or joint. There is no malodor. Periwound integrity is intact. Skin is warm, dry and supple bilateral lower extremities.  Thickened, elongated, dystrophic nails noted 1-5 bilateral. Hyperkeratotic callus lesions also noted to the bilateral forefoot and on palpation 4.  Vascular: Palpable pedal pulses bilaterally. No edema or erythema noted. Capillary refill within normal limits.  Neurological: Epicritic and protective threshold absent bilaterally.   Musculoskeletal Exam: Range of motion within normal limits to all pedal and ankle joints bilateral. Muscle strength 5/5 in all groups bilateral.   Assessment: #1 ulceration right plantar forefoot secondary to diabetes mellitus #2 diabetes mellitus w/ peripheral neuropathy #3 painful onychomycosis digits 1-5 bilateral #4 painful callus lesions bilateral forefoot 4   Plan of Care:  #1 Patient was evaluated. #2 medically necessary excisional debridement including subcutaneous tissue was performed using a tissue nipper and a  chisel blade. Excisional debridement of all the necrotic nonviable tissue down to healthy bleeding viable tissue was performed with post-debridement measurements same as pre-. #3 the wound was cleansed and dry sterile dressing applied. #4 mechanical debridement of nails 1-5 bilaterally was performed using a nail nipper without incident  #5 excisional debridement of callus lesions was performed to the bilateral forefoot using a chisel blade without incident or bleeding 4  #6 patient is to return to clinic in 3 weeks.   Edrick Kins, DPM [facility]  Dr. Edrick Kins, [address]                                        [place], [place] [address]                Office [phone]  Fax [phone]

## 2016-02-22 ENCOUNTER — Ambulatory Visit (INDEPENDENT_AMBULATORY_CARE_PROVIDER_SITE_OTHER): Payer: Medicare Other | Admitting: Podiatry

## 2016-02-22 ENCOUNTER — Encounter: Payer: Self-pay | Admitting: Podiatry

## 2016-02-22 DIAGNOSIS — E1143 Type 2 diabetes mellitus with diabetic autonomic (poly)neuropathy: Secondary | ICD-10-CM

## 2016-02-22 DIAGNOSIS — I70235 Atherosclerosis of native arteries of right leg with ulceration of other part of foot: Secondary | ICD-10-CM | POA: Diagnosis not present

## 2016-02-22 DIAGNOSIS — E0843 Diabetes mellitus due to underlying condition with diabetic autonomic (poly)neuropathy: Secondary | ICD-10-CM | POA: Diagnosis not present

## 2016-02-22 DIAGNOSIS — L97522 Non-pressure chronic ulcer of other part of left foot with fat layer exposed: Secondary | ICD-10-CM

## 2016-02-22 DIAGNOSIS — I70245 Atherosclerosis of native arteries of left leg with ulceration of other part of foot: Secondary | ICD-10-CM

## 2016-02-22 DIAGNOSIS — E08621 Diabetes mellitus due to underlying condition with foot ulcer: Secondary | ICD-10-CM

## 2016-02-22 DIAGNOSIS — L97512 Non-pressure chronic ulcer of other part of right foot with fat layer exposed: Secondary | ICD-10-CM

## 2016-02-22 DIAGNOSIS — L97509 Non-pressure chronic ulcer of other part of unspecified foot with unspecified severity: Secondary | ICD-10-CM

## 2016-02-22 MED ORDER — SULFAMETHOXAZOLE-TRIMETHOPRIM 800-160 MG PO TABS: 1.0000 | ORAL_TABLET | Freq: Two times a day (BID) | ORAL | 0 refills | Status: DC

## 2016-02-24 LAB — WOUND CULTURE

## 2016-03-04 NOTE — Progress Notes (Signed)
Subjective:  Patient presents today for follow-up evaluation of bilateral forefoot ulcerations. Patient believes they're no better. There is been no improvement. Patient states that his right foot ulceration with bleeding yesterday.   Objective/Physical Exam General: The patient is alert and oriented x3 in no acute distress.  Dermatology:  Wound #1 noted to the weightbearing surface of the second MPJ right foot measuring [phone] cm (LxWxD).   Wound #2 noted to the weightbearing surface of the left foot measuring approximately [phone] cm (LxWxD).   To the noted ulceration(s), there is no eschar. There is a moderate amount of slough, fibrin, and necrotic tissue noted. Granulation tissue and wound base is red. There is a minimal amount of serosanguineous drainage noted. There is no exposed bone muscle-tendon ligament or joint. There is no malodor. Periwound integrity is intact. Skin is warm, dry and supple bilateral lower extremities.  Vascular: Palpable pedal pulses bilaterally. No edema or erythema noted. Capillary refill within normal limits.  Neurological: Epicritic and protective threshold absent bilaterally.   Musculoskeletal Exam: Range of motion within normal limits to all pedal and ankle joints bilateral. Muscle strength 5/5 in all groups bilateral.   Assessment: #1 ulceration bilateral plantar forefoot secondary to diabetes mellitus #2 diabetes mellitus w/ peripheral neuropathy  Plan of Care:  #1 Patient was evaluated. #2 medically necessary excisional debridement including subcutaneous tissue was performed using a tissue nipper and a chisel blade. Excisional debridement of all the necrotic nonviable tissue down to healthy bleeding viable tissue was performed with post-debridement measurements same as pre-. #3 the wound was cleansed and dry sterile dressing applied. #4 today culture was taken of the right foot ulceration #5 prescription for Bactrim DS #6 return to clinic  in 2 weeks   Edrick Kins, DPM [facility]  Dr. Edrick Kins, [address]                                        [place], [place] [address]                Office [phone]  Fax [phone]

## 2016-03-07 ENCOUNTER — Ambulatory Visit: Payer: Medicare Other | Admitting: Podiatry

## 2016-03-07 DIAGNOSIS — L97511 Non-pressure chronic ulcer of other part of right foot limited to breakdown of skin: Secondary | ICD-10-CM

## 2016-03-07 DIAGNOSIS — E11621 Type 2 diabetes mellitus with foot ulcer: Secondary | ICD-10-CM | POA: Insufficient documentation

## 2016-03-10 ENCOUNTER — Encounter: Payer: Self-pay | Admitting: Podiatry

## 2016-03-10 ENCOUNTER — Ambulatory Visit (INDEPENDENT_AMBULATORY_CARE_PROVIDER_SITE_OTHER): Payer: Medicare Other | Admitting: Podiatry

## 2016-03-10 VITALS — Temp 98.0°F

## 2016-03-10 DIAGNOSIS — E0843 Diabetes mellitus due to underlying condition with diabetic autonomic (poly)neuropathy: Secondary | ICD-10-CM

## 2016-03-10 DIAGNOSIS — E08621 Diabetes mellitus due to underlying condition with foot ulcer: Secondary | ICD-10-CM

## 2016-03-10 DIAGNOSIS — L97509 Non-pressure chronic ulcer of other part of unspecified foot with unspecified severity: Secondary | ICD-10-CM

## 2016-03-10 DIAGNOSIS — I70235 Atherosclerosis of native arteries of right leg with ulceration of other part of foot: Secondary | ICD-10-CM

## 2016-03-10 DIAGNOSIS — E1143 Type 2 diabetes mellitus with diabetic autonomic (poly)neuropathy: Secondary | ICD-10-CM | POA: Diagnosis not present

## 2016-03-10 DIAGNOSIS — L97512 Non-pressure chronic ulcer of other part of right foot with fat layer exposed: Secondary | ICD-10-CM

## 2016-03-10 NOTE — Progress Notes (Signed)
Subjective:  Patient presents today for follow-up evaluation of bilateral ulcerations secondary to diabetes mellitus. Patient states that his right foot wound is sore to touch and possibly getting worse. He did notice drainage with odor. Patient presents today for follow-up treatment and evaluation   Objective/Physical Exam General: The patient is alert and oriented x3 in no acute distress.  Dermatology:  Wound #1 noted to the weightbearing surface of the second MPJ right foot measuring [phone] cm (LxWxD).   Wound #2 noted to the weightbearing surface of the left foot has healed. Complete reepithelialization has occurred. Periwound is callused however there is no open ulceration. Negative for drainage.   To the noted ulceration(s), there is no eschar. There is a moderate amount of slough, fibrin, and necrotic tissue noted. Granulation tissue and wound base is red. There is a minimal amount of serosanguineous drainage noted. There is no exposed bone muscle-tendon ligament or joint. There is no malodor. Periwound integrity is intact. Skin is warm, dry and supple bilateral lower extremities.  Vascular: Palpable pedal pulses bilaterally. No edema or erythema noted. Capillary refill within normal limits.  Neurological: Epicritic and protective threshold absent bilaterally.   Musculoskeletal Exam: Range of motion within normal limits to all pedal and ankle joints bilateral. Muscle strength 5/5 in all groups bilateral.   Assessment: #1 ulceration right plantar forefoot secondary to diabetes mellitus  #2 diabetes mellitus w/ peripheral neuropathy  Plan of Care:  #1 Patient was evaluated. #2 medically necessary excisional debridement including subcutaneous tissue was performed using a tissue nipper and a chisel blade. Excisional debridement of all the necrotic nonviable tissue down to healthy bleeding viable tissue was performed with post-debridement measurements same as pre-. #3 the wound was  cleansed and dry sterile dressing applied. #4 patient recently completed his prescription for Bactrim DS  #5 today orders for home health dressing changes are going to be placed. #6 postoperative shoe dispensed. #7 return to clinic in 2 weeks   Edrick Kins, DPM [facility]  Dr. Edrick Kins, [address] [place]                                        [place],  [address]                Office [phone]  Fax [phone]

## 2016-03-13 ENCOUNTER — Ambulatory Visit (INDEPENDENT_AMBULATORY_CARE_PROVIDER_SITE_OTHER): Payer: Medicare Other | Admitting: Family Medicine

## 2016-03-13 ENCOUNTER — Telehealth: Payer: Self-pay | Admitting: *Deleted

## 2016-03-13 ENCOUNTER — Encounter: Payer: Self-pay | Admitting: Family Medicine

## 2016-03-13 VITALS — BP 128/74 | HR 115 | Temp 97.7°F | Resp 20 | Ht 77.0 in | Wt 239.6 lb

## 2016-03-13 DIAGNOSIS — E785 Hyperlipidemia, unspecified: Secondary | ICD-10-CM | POA: Diagnosis not present

## 2016-03-13 DIAGNOSIS — Z9181 History of falling: Secondary | ICD-10-CM

## 2016-03-13 DIAGNOSIS — J411 Mucopurulent chronic bronchitis: Secondary | ICD-10-CM

## 2016-03-13 DIAGNOSIS — N521 Erectile dysfunction due to diseases classified elsewhere: Secondary | ICD-10-CM

## 2016-03-13 DIAGNOSIS — E1169 Type 2 diabetes mellitus with other specified complication: Secondary | ICD-10-CM

## 2016-03-13 DIAGNOSIS — E1140 Type 2 diabetes mellitus with diabetic neuropathy, unspecified: Secondary | ICD-10-CM

## 2016-03-13 DIAGNOSIS — IMO0002 Reserved for concepts with insufficient information to code with codable children: Secondary | ICD-10-CM

## 2016-03-13 DIAGNOSIS — E1165 Type 2 diabetes mellitus with hyperglycemia: Secondary | ICD-10-CM

## 2016-03-13 DIAGNOSIS — E875 Hyperkalemia: Secondary | ICD-10-CM

## 2016-03-13 DIAGNOSIS — E11621 Type 2 diabetes mellitus with foot ulcer: Secondary | ICD-10-CM

## 2016-03-13 DIAGNOSIS — I10 Essential (primary) hypertension: Secondary | ICD-10-CM

## 2016-03-13 DIAGNOSIS — L97511 Non-pressure chronic ulcer of other part of right foot limited to breakdown of skin: Secondary | ICD-10-CM

## 2016-03-13 DIAGNOSIS — E1121 Type 2 diabetes mellitus with diabetic nephropathy: Secondary | ICD-10-CM | POA: Diagnosis not present

## 2016-03-13 LAB — BASIC METABOLIC PANEL WITH GFR
BUN: 42 mg/dL — ABNORMAL HIGH (ref 7–25)
CHLORIDE: 113 mmol/L — AB (ref 98–110)
CO2: 18 mmol/L — AB (ref 20–31)
CREATININE: 2.06 mg/dL — AB (ref 0.70–1.18)
Calcium: 8.9 mg/dL (ref 8.6–10.3)
GFR, EST NON AFRICAN AMERICAN: 31 mL/min — AB (ref >=60)
GFR, Est African American: 36 mL/min — ABNORMAL LOW (ref >=60)
Glucose, Bld: 195 mg/dL — ABNORMAL HIGH (ref 65–99)
Potassium: 6.6 mmol/L (ref 3.5–5.3)
SODIUM: 136 mmol/L (ref 135–146)

## 2016-03-13 NOTE — Progress Notes (Signed)
Name: Joseph Newman   MRN: [id number]    DOB: 1945-10-21   Date:03/13/2016       Progress Note  Subjective  Chief Complaint  Chief Complaint  Patient presents with  . Medication Refill    4 month F/U  . Diabetes    Still seeing Dr. Eddie Dibbles for DM  . Hypertension    Denies any symptoms  . Hyperlipidemia  . COPD  . Fall    Patient had a recent fall at home and scrapped up his nose.     HPI  HTN: he has been compliant with his medications and denies side effects. He is Exforge  and Coreg daily and bp is at goal, he dizziness or chest pain.   Hyperlipidemia:he is on Atorvastatin, denies myalgias. Labs done by endo on 02/2016 by Dr. Eddie Dibbles,  showed HDLis very low, LDL at goal. Discussed importance of life style change, and also to start fish oil. He states he is tired of pills.   COPD: he refuses to have a spirometry and also does not want to pay for expensive medication.  He is not very physically active and still smokes one pack a day, and  is not willing to quit at this time. He coughs in am, and the sputum is usually clear. He has some SOB with mild activity, but denies wheezing. Wife hears him coughing during the night, but worse in am.  DM: he sees Dr. Eddie Dibbles, off insulin pump, on Trulicity and TOIZ1I is much better now 7.6%  08/2015 but up to 8.0% 02/2016 at Dr. Jacqualine Code' office.  He denies polyphagia, polydipsia but he has polyuria every 2 hours . He denies blurred vision. He has microalbuminuria and is taking Valsartan. He is also seeing Dr. Holley Raring. He has ED, CKI and now a diabetic foot ulcer. Just finished Septra and kidney function dropped while on medication  Morbid Obesity: he has lost weight , 11 more  lbs since last visit, doing well on Trulicity. He states he is not as hungry. He seems depressed to me. No longer going to church, wants to stay at home, discussed symptoms of depression but he refuses medication  Fall: he was sitting and needed to get something from the floor. He got up  and stooped down and fell on his face, no loss of consciousness, he scrapped his nose but no other injuries. .   Patient Active Problem List   Diagnosis Date Noted  . Dyslipidemia associated with type 2 diabetes mellitus ([place]) 03/13/2016  . Diabetic ulcer of toe of right foot associated with type 2 diabetes mellitus, limited to breakdown of skin ([place]) 03/07/2016  . Microscopic hematuria 05/30/2015  . Foot ulcer, left ([place]) 01/19/2015  . 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 Dyslipidemia 08/31/2014  . Tobacco abuse 08/31/2014  . Diabetes mellitus with renal manifestation ([place]) 08/31/2014  . COPD (chronic obstructive pulmonary disease) ([place]) 08/31/2014  . Microalbuminuria 08/31/2014  . Morbid obesity ([place]) 08/31/2014  . ED (erectile dysfunction) 08/31/2014  . Hypertension   . Dyspnea 10/09/2011    Past Surgical History:  Procedure Laterality Date  . foot surgery      Family History  Problem Relation Age of Onset  . Hypertension Mother   . Kidney disease Neg Hx   . Prostate cancer Neg Hx     Social History   Social History  . Marital status: Married    Spouse name: N/A  . Number of children: N/A  .  Years of education: N/A   Occupational History  . Not on file.   Social History Main Topics  . Smoking status: Current Every Day Smoker    Packs/day: 1.00    Years: 30.00    Types: Cigarettes, Cigars    Last attempt to quit: 02/25/2003  . Smokeless tobacco: Never Used  . Alcohol use 0.0 oz/week     Comment: occasional  . Drug use: No  . Sexual activity: Not Currently   Other Topics Concern  . Not on file   Social History Narrative  . No narrative on file     Current Outpatient Prescriptions:  .  ACCU-CHEK AVIVA PLUS test strip, , Disp: , Rfl:  .  Amlodipine-Valsartan-HCTZ 10-320-25 MG TABS, TAKE 1 TABLET BY MOUTH DAILY., Disp: 90 tablet, Rfl: 1 .  aspirin EC 81 MG tablet, Take 81 mg by mouth daily., Disp: , Rfl:  .   atorvastatin (LIPITOR) 40 MG tablet, TAKE 1 TABLET (40 MG TOTAL) BY MOUTH DAILY., Disp: 90 tablet, Rfl: 1 .  Blood Glucose Monitoring Suppl (ONE TOUCH ULTRA SYSTEM KIT) W/DEVICE KIT, 1 kit by Does not apply route once., Disp: , Rfl:  .  carvedilol (COREG) 6.25 MG tablet, , Disp: , Rfl:  .  Cholecalciferol (VITAMIN D) 2000 UNITS CAPS, Take by mouth., Disp: , Rfl:  .  Cyanocobalamin (VITAMIN B-12) 1000 MCG SUBL, Take 1 tablet by mouth daily., Disp: , Rfl:  .  metFORMIN (GLUCOPHAGE) 1000 MG tablet, Take 1 tablet by mouth 2 (two) times daily., Disp: , Rfl:  .  NEEDLE, DISP, 22 G (B-D DISP NEEDLE 22GX3/4") 22G X 3/4" MISC, by Does not apply route., Disp: , Rfl:  .  sulfamethoxazole-trimethoprim (BACTRIM DS,SEPTRA DS) 800-160 MG tablet, Take 1 tablet by mouth 2 (two) times daily., Disp: 20 tablet, Rfl: 0 .  TRULICITY 1.5 EX/5.1ZG SOPN, , Disp: , Rfl:   No Known Allergies   ROS  Constitutional: Negative for fever, positive for  weight change.  Respiratory: Positive  for cough and shortness of breath.   Cardiovascular: Negative for chest pain or palpitations.  Gastrointestinal: Negative for abdominal pain, no bowel changes.  Musculoskeletal: Positive for gait problem but no  joint swelling.  Skin: Foot ulcer on right foot  Neurological: Negative for dizziness or headache.  No other specific complaints in a complete review of systems (except as listed in HPI above).   Objective  Vitals:   03/13/16 1043  BP: 128/74  Pulse: (!) 115  Resp: 20  Temp: 97.7 F (36.5 C)  TempSrc: Oral  SpO2: 96%  Weight: 239 lb 9.6 oz (108.7 kg)  Height: _0  (1.956 m)    Body mass index is 28.41 kg/m.  Physical Exam  Constitutional: Patient appears well-developed  Obese No distress.  HEENT: head atraumatic, normocephalic, pupils equal and reactive to light, neck supple, throat within normal limits Cardiovascular: Normal rate, regular rhythm and normal heart sounds.  No murmur heard. No BLE  edema. Pulmonary/Chest: Effort normal and breath sounds normal. No respiratory distress. Abdominal: Soft.  There is no tenderness. Psychiatric: Patient has a normal mood and affect. behavior is normal. Judgment and thought content normal.  Recent Results (from the past 2160 hour(s))  WOUND CULTURE     Status: Abnormal   Collection Time: 02/22/16 11:45 AM  Result Value Ref Range   Gram Stain Result Final report    Result 1 Comment     Comment: No white blood cells seen.   RESULT 2  Comment     Comment: Moderate gram negative rods.   RESULT 3 Comment     Comment: Moderate number of gram positive cocci.   Aerobic Bacterial Culture Final report (A)    Result 1 Proteus mirabilis (A)     Comment: Heavy growth   Result 2 Mixed skin flora     Comment: Light growth   ANTIMICROBIAL SUSCEPTIBILITY Comment     Comment:       ** S = Susceptible; I = Intermediate; R = Resistant **                    P = Positive; N = Negative             MICS are expressed in micrograms per mL    Antibiotic                 RSLT#1    RSLT#2    RSLT#3    RSLT#4 Amoxicillin/Clavulanic Acid    S Ampicillin                     S Cefepime                       S Ceftriaxone                    S Cefuroxime                     S Ciprofloxacin                  S Gentamicin                     S Levofloxacin                   S Piperacillin                   S Tetracycline                   R Tobramycin                     S Trimethoprim/Sulfa             S       PHQ2/9: Depression screen [facility] 2/9 03/13/2016 11/10/2015 05/10/2015 11/04/2014 08/31/2014  Decreased Interest 0 0 0 0 0  Down, Depressed, Hopeless 0 0 0 0 0  PHQ - 2 Score 0 0 0 0 0     Fall Risk: Fall Risk  03/13/2016 11/10/2015 05/10/2015 11/04/2014 08/31/2014  Falls in the past year? Yes No No No No  Number falls in past yr: 1 - - - -  Injury with Fall? No - - - -     Functional Status Survey: Is the patient deaf or have difficulty hearing?:  No Does the patient have difficulty seeing, even when wearing glasses/contacts?: No Does the patient have difficulty concentrating, remembering, or making decisions?: No Does the patient have difficulty walking or climbing stairs?: No Does the patient have difficulty dressing or bathing?: No Does the patient have difficulty doing errands alone such as visiting a doctor's office or shopping?: No   Assessment & Plan  1. Mucopurulent chronic bronchitis ([place])  He is still smoking, he only wants medication that is free, but he refuses nebulizer machine  2. 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 Eddie Dibbles   3. Morbid obesity, unspecified obesity type ([place])  He has lost weight since he started on Trulicity  Discussed with the patient the risk posed by an increased BMI. Discussed importance of portion control, calorie counting and at least 150 minutes of physical activity weekly. Avoid sweet beverages and drink more water. Eat at least 6 servings of fruit and vegetables daily   4. 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 Dyslipidemia associated with type 2 diabetes mellitus (HCC)  HDL is low Blood work done a couple of weeks ago by Dr. Eddie Dibbles   6. Essential hypertension  At goal   7. Diabetic ulcer of toe of right foot associated with type 2 diabetes mellitus, limited to breakdown of skin (HCC)  Wearing a diabetic shoe, explained increase risk of falls  8. Hyperkalemia  - BASIC METABOLIC PANEL WITH GFR  9. History of recent fall  He refuses referral to PT,

## 2016-03-13 NOTE — Telephone Encounter (Addendum)
-----   Message from Edrick Kins, DPM sent at 03/10/2016  5:14 PM EST ----- Regarding: Home health dressing changes Please arrange home health dressing changes for patient. Patient does not drive and is homebound. Every other day x 4 weeks.  Dx : RT diabetic foot ulceration  - Cleanse with normal saline. Dry.  - Paint periwound with betadine.  - apply Prisma collagen dressing and aquacel ag.  - dress with 4x4 gauze, kerlex, lightly wrapped 4" coban or ace.   Thanks, Dr. Amalia Hailey. 03/13/2016-Faxed required form, orders, clinicals and demographics to [facility]. Meghan - [facility] asked when pt's dressing was last changed. Unable to leave message informing Meghan that pt's last dressing change had been 03/10/2016 at his last appt here. 03/14/2016-Meghan Nanine Means states she called the wife and the wife had changed the dressing. Meghan stated she just wanted to see when they needed to send someone out. 04/17/2016-Dr. Amalia Hailey states continue previous wound care orders another 6 weeks. Faxed to [facility].04/27/2016-Sherry Renette Butters states today is the last visit for pt and she know that he has an appt with Dr. Amalia Hailey tomorrow and she would need an extension with new orders to treat the wound that now has escar.

## 2016-03-14 ENCOUNTER — Other Ambulatory Visit: Payer: Self-pay

## 2016-03-14 ENCOUNTER — Other Ambulatory Visit: Payer: Self-pay | Admitting: Family Medicine

## 2016-03-14 ENCOUNTER — Other Ambulatory Visit
Admission: RE | Admit: 2016-03-14 | Discharge: 2016-03-14 | Disposition: A | Discharge status: Home / Self Care | Payer: Medicare Other | Source: Ambulatory Visit | Attending: Family Medicine | Admitting: Family Medicine

## 2016-03-14 DIAGNOSIS — E875 Hyperkalemia: Secondary | ICD-10-CM

## 2016-03-14 LAB — POTASSIUM: POTASSIUM: 6.3 mmol/L — AB (ref 3.5–5.1)

## 2016-03-17 LAB — BASIC METABOLIC PANEL
BUN: 29 mg/dL — AB (ref 4–21)
Creatinine: 1.4 mg/dL — AB (ref 0.6–1.3)
Glucose: 171 mg/dL
POTASSIUM: 4.6 mmol/L (ref 3.4–5.3)
SODIUM: 142 mmol/L (ref 137–147)

## 2016-03-20 ENCOUNTER — Encounter: Payer: Self-pay | Admitting: Family Medicine

## 2016-03-22 ENCOUNTER — Encounter: Payer: Self-pay | Admitting: Family Medicine

## 2016-03-24 ENCOUNTER — Ambulatory Visit (INDEPENDENT_AMBULATORY_CARE_PROVIDER_SITE_OTHER): Payer: Medicare Other | Admitting: Podiatry

## 2016-03-24 ENCOUNTER — Encounter: Payer: Self-pay | Admitting: Podiatry

## 2016-03-24 DIAGNOSIS — E08621 Diabetes mellitus due to underlying condition with foot ulcer: Secondary | ICD-10-CM | POA: Diagnosis not present

## 2016-03-24 DIAGNOSIS — L97509 Non-pressure chronic ulcer of other part of unspecified foot with unspecified severity: Secondary | ICD-10-CM

## 2016-03-24 DIAGNOSIS — L97512 Non-pressure chronic ulcer of other part of right foot with fat layer exposed: Secondary | ICD-10-CM | POA: Diagnosis not present

## 2016-03-24 DIAGNOSIS — I70235 Atherosclerosis of native arteries of right leg with ulceration of other part of foot: Secondary | ICD-10-CM

## 2016-03-28 ENCOUNTER — Encounter: Payer: Self-pay | Admitting: Family Medicine

## 2016-04-06 NOTE — Progress Notes (Signed)
Subjective:  Patient presents today for follow-up evaluation of an ulceration to the right plantar forefoot. Patient states that he's doing much better in the wound is slowly healing. Patient has been wearing his postoperative shoe. Patient is also having home health dressing changes.   Objective/Physical Exam General: The patient is alert and oriented x3 in no acute distress.  Dermatology:  Wound #1 noted to the weightbearing surface of the second MPJ right foot measuring [phone] cm (LxWxD).   To the noted ulceration(s), there is no eschar. There is a moderate amount of slough, fibrin, and necrotic tissue noted. Granulation tissue and wound base is red. There is a minimal amount of serosanguineous drainage noted. There is no exposed bone muscle-tendon ligament or joint. There is no malodor. Periwound integrity is intact. Skin is warm, dry and supple bilateral lower extremities.  Vascular: Palpable pedal pulses bilaterally. No edema or erythema noted. Capillary refill within normal limits.  Neurological: Epicritic and protective threshold absent bilaterally.   Musculoskeletal Exam: Range of motion within normal limits to all pedal and ankle joints bilateral. Muscle strength 5/5 in all groups bilateral.   Assessment: #1 ulceration right plantar forefoot secondary to diabetes mellitus  #2 diabetes mellitus w/ peripheral neuropathy  Plan of Care:  #1 Patient was evaluated. #2 medically necessary excisional debridement including subcutaneous tissue was performed using a tissue nipper and a chisel blade. Excisional debridement of all the necrotic nonviable tissue down to healthy bleeding viable tissue was performed with post-debridement measurements same as pre-. #3 the wound was cleansed and dry sterile dressing applied. #4 continue home health dressing change orders #5 continue postoperative shoe #6 return to clinic in 2 weeks  Edrick Kins, DPM [facility]  Dr.  Edrick Kins, [address]                                        [place], [place] [address]                Office [phone]  Fax [phone]

## 2016-04-11 ENCOUNTER — Encounter: Payer: Self-pay | Admitting: Podiatry

## 2016-04-11 ENCOUNTER — Ambulatory Visit (INDEPENDENT_AMBULATORY_CARE_PROVIDER_SITE_OTHER): Payer: Medicare Other | Admitting: Podiatry

## 2016-04-11 DIAGNOSIS — E0843 Diabetes mellitus due to underlying condition with diabetic autonomic (poly)neuropathy: Secondary | ICD-10-CM | POA: Diagnosis not present

## 2016-04-11 DIAGNOSIS — L84 Corns and callosities: Secondary | ICD-10-CM | POA: Diagnosis not present

## 2016-04-11 DIAGNOSIS — I70235 Atherosclerosis of native arteries of right leg with ulceration of other part of foot: Secondary | ICD-10-CM

## 2016-04-11 DIAGNOSIS — L97512 Non-pressure chronic ulcer of other part of right foot with fat layer exposed: Secondary | ICD-10-CM | POA: Diagnosis not present

## 2016-04-12 NOTE — Progress Notes (Signed)
Subjective:  Patient presents today for follow-up evaluation of an ulceration to the right plantar forefoot. Patient states that he's doing much better in the wound is slowly healing. Patient has been wearing his postoperative shoe. Patient is also having home health dressing changes.   Objective/Physical Exam General: The patient is alert and oriented x3 in no acute distress.  Dermatology:  Wound #1 noted to the weightbearing surface of the second MPJ right foot measuring [phone] cm (LxWxD).   To the noted ulceration(s), there is no eschar. There is a moderate amount of slough, fibrin, and necrotic tissue noted. Granulation tissue and wound base is red. There is a minimal amount of serosanguineous drainage noted. There is no exposed bone muscle-tendon ligament or joint. There is no malodor. Periwound integrity is intact. Skin is warm, dry and supple bilateral lower extremities.  Vascular: Palpable pedal pulses bilaterally. No edema or erythema noted. Capillary refill within normal limits.  Neurological: Epicritic and protective threshold absent bilaterally.   Musculoskeletal Exam: Range of motion within normal limits to all pedal and ankle joints bilateral. Muscle strength 5/5 in all groups bilateral.   Assessment: #1 ulceration right plantar forefoot secondary to diabetes mellitus  #2 pre-ulcerative callus lesion sub-second MPJ  #3 diabetes mellitus w/ peripheral neuropathy  Plan of Care:  #1 Patient was evaluated. #2 medically necessary excisional debridement including subcutaneous tissue was performed using a tissue nipper and a chisel blade. Excisional debridement of all the necrotic nonviable tissue down to healthy bleeding viable tissue was performed with post-debridement measurements same as pre-. #3 the wound was cleansed and dry sterile dressing applied. #4 today we are going to continue the home health nurse dressing change orders. Same orders just with an extension. #5  continue postoperative shoe #6 excisional debridement of the pre-ulcerative callus lesion was performed to the left foot using a chisel blade without incident or bleeding. #7 return to clinic in 2 weeks Edrick Kins, DPM [facility]  Dr. Edrick Kins, [address]                                        [place], [place] [address]                Office [phone]  Fax [phone]

## 2016-04-14 ENCOUNTER — Telehealth: Payer: Self-pay | Admitting: *Deleted

## 2016-04-14 NOTE — Telephone Encounter (Signed)
Home health nurse called and states Dr. Amalia Hailey wanted to continue home health orders and just needed confirmation. I did call her back and confirm those order for another 2 weeks. Patient will be evaluated then and updated orders will follow.

## 2016-04-17 ENCOUNTER — Telehealth: Payer: Self-pay | Admitting: *Deleted

## 2016-04-17 NOTE — Telephone Encounter (Addendum)
-----   Message from Edrick Kins, DPM sent at 04/12/2016  8:18 AM EDT ----- Regarding: Extension for home health dressing changes Patient states that we need to provide an extension for home health dressing changes. Extension for another 6 weeks. Same orders as before.   Thanks, Dr. Amalia Hailey. Orders faxed to [facility].

## 2016-04-22 ENCOUNTER — Other Ambulatory Visit: Payer: Self-pay | Admitting: Family Medicine

## 2016-04-22 DIAGNOSIS — E785 Hyperlipidemia, unspecified: Secondary | ICD-10-CM

## 2016-04-27 NOTE — Telephone Encounter (Signed)
Will do. Thanks, Dr. Amalia Hailey

## 2016-04-28 ENCOUNTER — Encounter: Payer: Self-pay | Admitting: Podiatry

## 2016-04-28 ENCOUNTER — Ambulatory Visit (INDEPENDENT_AMBULATORY_CARE_PROVIDER_SITE_OTHER): Payer: Medicare Other | Admitting: Podiatry

## 2016-04-28 ENCOUNTER — Other Ambulatory Visit: Payer: Self-pay | Admitting: Family Medicine

## 2016-04-28 ENCOUNTER — Telehealth: Payer: Self-pay | Admitting: *Deleted

## 2016-04-28 DIAGNOSIS — I70235 Atherosclerosis of native arteries of right leg with ulceration of other part of foot: Secondary | ICD-10-CM

## 2016-04-28 DIAGNOSIS — E0843 Diabetes mellitus due to underlying condition with diabetic autonomic (poly)neuropathy: Secondary | ICD-10-CM | POA: Diagnosis not present

## 2016-04-28 DIAGNOSIS — L84 Corns and callosities: Secondary | ICD-10-CM | POA: Diagnosis not present

## 2016-04-28 DIAGNOSIS — I10 Essential (primary) hypertension: Secondary | ICD-10-CM

## 2016-04-28 DIAGNOSIS — L97512 Non-pressure chronic ulcer of other part of right foot with fat layer exposed: Secondary | ICD-10-CM

## 2016-04-28 NOTE — Telephone Encounter (Signed)
Patient requesting refill of Amlodipine-Valsartan-HCTZ to [facility].

## 2016-04-28 NOTE — Progress Notes (Signed)
Subjective:  Patient presents today for follow-up evaluation of an ulceration to the right plantar forefoot. Patient states that he's doing much better in the wound is slowly healing. Patient presents today wearing his diabetic shoes.  Patient also states that he his home health dressing change orders need to be renewed.   Objective/Physical Exam General: The patient is alert and oriented x3 in no acute distress.  Dermatology:  Wound #1 noted to the weightbearing surface of the second MPJ right foot measuring [phone] cm (LxWxD).   To the noted ulceration(s), there is no eschar. There is a moderate amount of slough, fibrin, and necrotic tissue noted. Granulation tissue and wound base is red. There is a minimal amount of serosanguineous drainage noted. There is no exposed bone muscle-tendon ligament or joint. There is no malodor. Periwound integrity is intact. Skin is warm, dry and supple bilateral lower extremities.  Large callus lesion noted to the left plantar forefoot.   Vascular: Palpable pedal pulses bilaterally. No edema or erythema noted. Capillary refill within normal limits.  Neurological: Epicritic and protective threshold absent bilaterally.   Musculoskeletal Exam: Range of motion within normal limits to all pedal and ankle joints bilateral. Muscle strength 5/5 in all groups bilateral.   Assessment: #1 ulceration right plantar forefoot secondary to diabetes mellitus  #2 pre-ulcerative callus lesion sub-second MPJ left foot #3 diabetes mellitus w/ peripheral neuropathy  Plan of Care:  #1 Patient was evaluated. #2 medically necessary excisional debridement including subcutaneous tissue was performed using a tissue nipper and a chisel blade. Excisional debridement of all the necrotic nonviable tissue down to healthy bleeding viable tissue was performed with post-debridement measurements same as pre-. #3 the wound was cleansed and dry sterile dressing applied. #4 today we will  provide new orders for home health care #5 continue postoperative shoe #6 excisional debridement of the pre-ulcerative callus lesion was performed to the left foot using a chisel blade without incident or bleeding. #7 return to clinic in 2 weeks   Edrick Kins, DPM [facility]  Dr. Edrick Kins, [address]                                        [place], [place] [address]                Office [phone]  Fax [phone]

## 2016-04-28 NOTE — Telephone Encounter (Addendum)
-----   Message from Edrick Kins, DPM sent at 04/28/2016 12:32 PM EDT ----- Regarding: [facility] dressing change orders Please modify/renew Home Health orders as follows:  Dx: diabetic foot ulcer right.   - cleanse with normal saline. Dry.  - apply moistened collagen dressing (prisma or puricol0 - apply overlying aquacel ag - please apply donut pad and metatarsal pad to bilateral feet.  - dress with 4x4, coban wrap.   Thanks, Dr. Amalia Hailey. Faxed copy of 04/28/2016 12:32pm orders to [facility].05/26/2016-Sherry Renette Butters states she is preparing to schedule Medicare discharge exam next week and is responsible for letting Dr. Amalia Hailey know.

## 2016-05-12 ENCOUNTER — Encounter: Payer: Self-pay | Admitting: Podiatry

## 2016-05-12 ENCOUNTER — Ambulatory Visit (INDEPENDENT_AMBULATORY_CARE_PROVIDER_SITE_OTHER): Payer: Medicare Other | Admitting: Podiatry

## 2016-05-12 DIAGNOSIS — I70235 Atherosclerosis of native arteries of right leg with ulceration of other part of foot: Secondary | ICD-10-CM | POA: Diagnosis not present

## 2016-05-12 DIAGNOSIS — L84 Corns and callosities: Secondary | ICD-10-CM

## 2016-05-12 DIAGNOSIS — L97512 Non-pressure chronic ulcer of other part of right foot with fat layer exposed: Secondary | ICD-10-CM | POA: Diagnosis not present

## 2016-05-14 NOTE — Progress Notes (Signed)
Subjective:  Patient presents today for follow-up evaluation of an ulceration to the right plantar forefoot. Patient states that he is doing well and thinks that wound is getting better. He denies any other complaints  Objective/Physical Exam General: The patient is alert and oriented x3 in no acute distress.  Dermatology:  Wound #1 noted to the weightbearing surface of the second MPJ right foot measuring [phone] cm (LxWxD).   To the noted ulceration(s), there is no eschar. There is a moderate amount of slough, fibrin, and necrotic tissue noted. Granulation tissue and wound base is red. There is a minimal amount of serosanguineous drainage noted. There is no exposed bone muscle-tendon ligament or joint. There is no malodor. Periwound integrity is intact. Skin is warm, dry and supple bilateral lower extremities.  Large callus lesion noted to the left plantar forefoot.   Vascular: Palpable pedal pulses bilaterally. No edema or erythema noted. Capillary refill within normal limits.  Neurological: Epicritic and protective threshold absent bilaterally.   Musculoskeletal Exam: Range of motion within normal limits to all pedal and ankle joints bilateral. Muscle strength 5/5 in all groups bilateral.   Assessment: #1 ulceration right plantar forefoot secondary to diabetes mellitus  #2 pre-ulcerative callus lesion sub-second MPJ left foot #3 diabetes mellitus w/ peripheral neuropathy  Plan of Care:  #1 Patient was evaluated. #2 medically necessary excisional debridement including subcutaneous tissue was performed using a tissue nipper and a chisel blade. Excisional debridement of all the necrotic nonviable tissue down to healthy bleeding viable tissue was performed with post-debridement measurements same as pre-. #3 the wound was cleansed and dry sterile dressing applied. #4 excisional debridement of the pre-ulcerative callus lesion was performed to the left foot using a chisel blade without  incident or bleeding. #5 return to clinic in 2-3 weeks   Edrick Kins, DPM [facility]  Dr. Edrick Kins, [address]                                        [place], [place] [address]                Office [phone]  Fax [phone]

## 2016-05-21 ENCOUNTER — Other Ambulatory Visit: Payer: Self-pay | Admitting: Family Medicine

## 2016-05-21 DIAGNOSIS — E785 Hyperlipidemia, unspecified: Secondary | ICD-10-CM

## 2016-05-26 NOTE — Telephone Encounter (Signed)
Okay, thanks

## 2016-05-30 ENCOUNTER — Ambulatory Visit (INDEPENDENT_AMBULATORY_CARE_PROVIDER_SITE_OTHER): Payer: Medicare Other | Admitting: Podiatry

## 2016-05-30 ENCOUNTER — Encounter: Payer: Self-pay | Admitting: Podiatry

## 2016-05-30 DIAGNOSIS — I70235 Atherosclerosis of native arteries of right leg with ulceration of other part of foot: Secondary | ICD-10-CM

## 2016-05-30 DIAGNOSIS — L97512 Non-pressure chronic ulcer of other part of right foot with fat layer exposed: Secondary | ICD-10-CM

## 2016-05-31 NOTE — Progress Notes (Signed)
Subjective:  Patient with PMHx of DM presents today for follow-up evaluation of an ulceration to the right plantar forefoot. Patient states that he is doing well and denies any pain. He denies any other complaints at this time.  Objective/Physical Exam General: The patient is alert and oriented x3 in no acute distress.  Dermatology:  Wound #1 noted to the weightbearing surface of the right foot measuring 1.0  1.0  0.2 cm (LxWxD).   To the noted ulceration(s), there is no eschar. There is a moderate amount of slough, fibrin, and necrotic tissue noted. Granulation tissue and wound base is red. There is a minimal amount of serosanguineous drainage noted. There is no exposed bone muscle-tendon ligament or joint. There is no malodor. Periwound integrity is intact. Skin is warm, dry and supple bilateral lower extremities.   Vascular: Palpable pedal pulses bilaterally. No edema or erythema noted. Capillary refill within normal limits.  Neurological: Epicritic and protective threshold absent bilaterally.   Musculoskeletal Exam: Range of motion within normal limits to all pedal and ankle joints bilateral. Muscle strength 5/5 in all groups bilateral.   Assessment: #1 ulceration right plantar forefoot secondary to diabetes mellitus   Plan of Care:  #1 Patient was evaluated. #2 medically necessary excisional debridement including subcutaneous tissue was performed using a tissue nipper and a chisel blade. Excisional debridement of all the necrotic nonviable tissue down to healthy bleeding viable tissue was performed with post-debridement measurements same as pre-. #3 the wound was cleansed and dry sterile dressing applied. #4 excisional debridement of the pre-ulcerative callus lesion was performed to the left foot using a chisel blade without incident or bleeding. #5 Continue home health dressing changes #6 discussed possible surgery in the future #7 return to clinic in 2 weeks   Edrick Kins,  DPM [facility]  Dr. Edrick Kins, [address]                                        [place], [place] [address]                Office [phone]  Fax [phone]

## 2016-06-09 LAB — HM DIABETES EYE EXAM

## 2016-06-14 ENCOUNTER — Encounter: Payer: Self-pay | Admitting: Family Medicine

## 2016-06-20 ENCOUNTER — Ambulatory Visit (INDEPENDENT_AMBULATORY_CARE_PROVIDER_SITE_OTHER): Payer: Medicare Other | Admitting: Podiatry

## 2016-06-20 DIAGNOSIS — I70235 Atherosclerosis of native arteries of right leg with ulceration of other part of foot: Secondary | ICD-10-CM | POA: Diagnosis not present

## 2016-06-20 DIAGNOSIS — L97512 Non-pressure chronic ulcer of other part of right foot with fat layer exposed: Secondary | ICD-10-CM

## 2016-06-20 DIAGNOSIS — E0842 Diabetes mellitus due to underlying condition with diabetic polyneuropathy: Secondary | ICD-10-CM

## 2016-06-20 DIAGNOSIS — L84 Corns and callosities: Secondary | ICD-10-CM

## 2016-06-20 NOTE — Progress Notes (Signed)
Subjective:  Patient with PMHx of DM presents today for follow-up evaluation of an ulceration to the right plantar forefoot. He states he is doing better but a callus keeps forming to the plantar aspect of the left foot.   Objective/Physical Exam General: The patient is alert and oriented x3 in no acute distress.  Dermatology:  Wound #1 noted to the weightbearing surface of the right foot measuring 0.8  0.8  0.1 cm (LxWxD).   To the noted ulceration(s), there is no eschar. There is a moderate amount of slough, fibrin, and necrotic tissue noted. Granulation tissue and wound base is red. There is a minimal amount of serosanguineous drainage noted. There is no exposed bone muscle-tendon ligament or joint. There is no malodor. Periwound integrity is intact. Skin is warm, dry and supple bilateral lower extremities.   Vascular: Palpable pedal pulses bilaterally. No edema or erythema noted. Capillary refill within normal limits.  Neurological: Epicritic and protective threshold absent bilaterally.   Musculoskeletal Exam: Range of motion within normal limits to all pedal and ankle joints bilateral. Muscle strength 5/5 in all groups bilateral.   Assessment: #1 ulceration right plantar forefoot secondary to diabetes mellitus  #2 preulcerative callus left sub 3rd MPJ  Plan of Care:  #1 Patient was evaluated. #2 medically necessary excisional debridement including subcutaneous tissue was performed using a tissue nipper and a chisel blade. Excisional debridement of all the necrotic nonviable tissue down to healthy bleeding viable tissue was performed with post-debridement measurements same as pre-. #3 the wound was cleansed and dry sterile dressing applied. #4 excisional debridement of the pre-ulcerative callus lesion was performed to the left foot using a chisel blade without incident or bleeding. #5 return to clinic in 3 weeks   Edrick Kins, DPM [facility]  Dr. Edrick Kins, [address]                                        [place], [place] [address]                Office [phone]  Fax [phone]

## 2016-07-11 ENCOUNTER — Ambulatory Visit (INDEPENDENT_AMBULATORY_CARE_PROVIDER_SITE_OTHER): Payer: Medicare Other | Admitting: Podiatry

## 2016-07-11 ENCOUNTER — Encounter: Payer: Self-pay | Admitting: Podiatry

## 2016-07-11 DIAGNOSIS — E0842 Diabetes mellitus due to underlying condition with diabetic polyneuropathy: Secondary | ICD-10-CM

## 2016-07-11 DIAGNOSIS — I70235 Atherosclerosis of native arteries of right leg with ulceration of other part of foot: Secondary | ICD-10-CM

## 2016-07-11 DIAGNOSIS — L97512 Non-pressure chronic ulcer of other part of right foot with fat layer exposed: Secondary | ICD-10-CM | POA: Diagnosis not present

## 2016-07-11 DIAGNOSIS — L84 Corns and callosities: Secondary | ICD-10-CM | POA: Diagnosis not present

## 2016-07-15 NOTE — Progress Notes (Signed)
Subjective:  Patient is a 71 year old male presenting today for follow-up evaluation of an ulceration secondary to diabetes mellitus to the plantar aspect of the right foot. He states his condition is unchanged. He denies any new complaints at this time.  Objective/Physical Exam General: The patient is alert and oriented x3 in no acute distress.  Dermatology:  Wound #1 noted to the weightbearing surface of the right foot measuring 2.0  1.5  0.2 cm (LxWxD).   To the noted ulceration(s), there is no eschar. There is a moderate amount of slough, fibrin, and necrotic tissue noted. Granulation tissue and wound base is red. There is a minimal amount of serosanguineous drainage noted. There is no exposed bone muscle-tendon ligament or joint. There is no malodor. Periwound integrity is intact. Skin is warm, dry and supple bilateral lower extremities.   Vascular: Palpable pedal pulses bilaterally. No edema or erythema noted. Capillary refill within normal limits.  Neurological: Epicritic and protective threshold absent bilaterally.   Musculoskeletal Exam: Range of motion within normal limits to all pedal and ankle joints bilateral. Muscle strength 5/5 in all groups bilateral.   Assessment: #1 ulceration right sub MPJ secondary to diabetes mellitus  #2 preulcerative callus left foot  Plan of Care:  #1 Patient was evaluated. #2 medically necessary excisional debridement including subcutaneous tissue was performed using a tissue nipper and a chisel blade. Excisional debridement of all the necrotic nonviable tissue down to healthy bleeding viable tissue was performed with post-debridement measurements same as pre-. #3 the wound was cleansed and dry sterile dressing applied. #4 excisional debridement of the pre-ulcerative callus lesion was performed to the left foot using a chisel blade without incident or bleeding. #5 dressing change supplies provided #6 return to clinic in 2-3 weeks   Edrick Kins, DPM [facility]  Dr. Edrick Kins, [address]                                        [place], [place] [address]                Office [phone]  Fax [phone]

## 2016-08-01 ENCOUNTER — Ambulatory Visit (INDEPENDENT_AMBULATORY_CARE_PROVIDER_SITE_OTHER): Payer: Medicare Other | Admitting: Podiatry

## 2016-08-01 ENCOUNTER — Encounter: Payer: Self-pay | Admitting: Podiatry

## 2016-08-01 DIAGNOSIS — E0842 Diabetes mellitus due to underlying condition with diabetic polyneuropathy: Secondary | ICD-10-CM

## 2016-08-01 DIAGNOSIS — L97512 Non-pressure chronic ulcer of other part of right foot with fat layer exposed: Secondary | ICD-10-CM

## 2016-08-01 DIAGNOSIS — I70235 Atherosclerosis of native arteries of right leg with ulceration of other part of foot: Secondary | ICD-10-CM | POA: Diagnosis not present

## 2016-08-01 DIAGNOSIS — L84 Corns and callosities: Secondary | ICD-10-CM

## 2016-08-12 ENCOUNTER — Other Ambulatory Visit: Payer: Self-pay | Admitting: Family Medicine

## 2016-08-12 DIAGNOSIS — I10 Essential (primary) hypertension: Secondary | ICD-10-CM

## 2016-08-14 ENCOUNTER — Other Ambulatory Visit: Payer: Self-pay | Admitting: Family Medicine

## 2016-08-14 MED ORDER — OLMESARTAN-AMLODIPINE-HCTZ 40-10-25 MG PO TABS: 1.0000 | ORAL_TABLET | Freq: Every day | ORAL | 1 refills | Status: DC

## 2016-08-19 NOTE — Progress Notes (Signed)
Subjective:  Patient with a history of diabetes mellitus presents today for follow-up treatment and evaluation of an ulcer to the right plantar forefoot. Patient states the ulcer is about the same. He believes he had a slight blood spot when taking a shower recently. Patient presents today for follow-up treatment and evaluation  Objective/Physical Exam General: The patient is alert and oriented x3 in no acute distress.  Dermatology:  Wound #1 noted to the weightbearing surface of the right foot measuring 001.001.001.001 cm (LxWxD).   To the noted ulceration(s), there is no eschar. There is a moderate amount of slough, fibrin, and necrotic tissue noted. Granulation tissue and wound base is red. There is a minimal amount of serosanguineous drainage noted. There is no exposed bone muscle-tendon ligament or joint. There is no malodor. Periwound integrity is intact. Skin is warm, dry and supple bilateral lower extremities.  There is also a pre-ulcerative callus lesion noted on the contralateral limb to the right plantar forefoot.   Vascular: Palpable pedal pulses bilaterally. No edema or erythema noted. Capillary refill within normal limits.  Neurological: Epicritic and protective threshold absent bilaterally.   Musculoskeletal Exam: Range of motion within normal limits to all pedal and ankle joints bilateral. Muscle strength 5/5 in all groups bilateral.   Assessment: #1 ulceration right sub MPJ secondary to diabetes mellitus  #2 preulcerative callus left foot  Plan of Care:  #1 Patient was evaluated. #2 medically necessary excisional debridement including subcutaneous tissue was performed using a tissue nipper and a chisel blade. Excisional debridement of all the necrotic nonviable tissue down to healthy bleeding viable tissue was performed with post-debridement measurements same as pre-. #3 the wound was cleansed and dry sterile dressing applied. #4 excisional debridement of the pre-ulcerative  callus lesion was performed to the left foot using a chisel blade without incident or bleeding. #5 dressing change supplies provided. Dressing supplies included Prisma plus dry sterile dressings #6 return to clinic in 2-3 weeks   Edrick Kins, DPM [facility]  Dr. Edrick Kins, [address]                                        [place], [place] [address]                Office [phone]  Fax [phone]

## 2016-08-22 ENCOUNTER — Encounter: Payer: Self-pay | Admitting: Podiatry

## 2016-08-22 ENCOUNTER — Ambulatory Visit (INDEPENDENT_AMBULATORY_CARE_PROVIDER_SITE_OTHER): Payer: Medicare Other | Admitting: Podiatry

## 2016-08-22 DIAGNOSIS — L84 Corns and callosities: Secondary | ICD-10-CM

## 2016-08-22 DIAGNOSIS — I70245 Atherosclerosis of native arteries of left leg with ulceration of other part of foot: Secondary | ICD-10-CM

## 2016-08-22 DIAGNOSIS — E0842 Diabetes mellitus due to underlying condition with diabetic polyneuropathy: Secondary | ICD-10-CM

## 2016-08-22 DIAGNOSIS — I70235 Atherosclerosis of native arteries of right leg with ulceration of other part of foot: Secondary | ICD-10-CM

## 2016-08-22 NOTE — Progress Notes (Signed)
   Subjective: Patient with a history of diabetes mellitus presents today for follow-up treatment and evaluation of bilateral pre-ulcerative callus lesions to the sub-2-3 MTPJ of the bilateral feet. At times these lesions ulcerate and he'll and then re-ulcerate. 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 Patient presents today for follow-up treatment and evaluation  Objective:  Physical Exam General: Alert and oriented x3 in no acute distress  Dermatology: Hyperkeratotic lesion present on the sub-2-3 MPJ bilateral feet. Negative for any pain on palpation. Today after debridement there is no open ulceration noted. Underlying callous tissue is somewhat macerated however. Otherwise intact with no sign of infectious process Patient is completely neuropathic.  Vascular: Palpable pedal pulses bilaterally. No edema or erythema noted. Capillary refill within normal limits.  Neurological: Epicritic and protective threshold absent bilaterally.   Musculoskeletal Exam: Pain on palpation at the keratotic lesion noted. Range of motion within normal limits bilateral. Muscle strength 5/5 in all groups bilateral.  Assessment: #1 diabetes mellitus with peripheral neuropathy #2 pre-ulcerative callous lesions bilateral sub-2-3 MTPJ   Plan of Care:  #1 Patient evaluated #2 Excisional debridement of keratoic lesion using a chisel blade was performed without incident.  #3 light dressing was applied. Continue diabetic shoes with offloading Plastizote inserts. #4 Patient is to return to the clinic in 3 weeks   Edrick Kins, DPM [facility]  Dr. Edrick Kins, [address]                                        [place], [place] [address]                Office [phone]  Fax [phone]

## 2016-09-11 ENCOUNTER — Ambulatory Visit (INDEPENDENT_AMBULATORY_CARE_PROVIDER_SITE_OTHER): Payer: Medicare Other | Admitting: Family Medicine

## 2016-09-11 ENCOUNTER — Encounter: Payer: Self-pay | Admitting: Family Medicine

## 2016-09-11 VITALS — BP 128/70 | HR 96 | Temp 97.9°F | Resp 16 | Ht 77.0 in | Wt 247.0 lb

## 2016-09-11 DIAGNOSIS — E1169 Type 2 diabetes mellitus with other specified complication: Secondary | ICD-10-CM | POA: Diagnosis not present

## 2016-09-11 DIAGNOSIS — E785 Hyperlipidemia, unspecified: Secondary | ICD-10-CM | POA: Diagnosis not present

## 2016-09-11 DIAGNOSIS — I10 Essential (primary) hypertension: Secondary | ICD-10-CM | POA: Diagnosis not present

## 2016-09-11 DIAGNOSIS — E1129 Type 2 diabetes mellitus with other diabetic kidney complication: Secondary | ICD-10-CM

## 2016-09-11 DIAGNOSIS — R809 Proteinuria, unspecified: Secondary | ICD-10-CM

## 2016-09-11 DIAGNOSIS — J411 Mucopurulent chronic bronchitis: Secondary | ICD-10-CM

## 2016-09-11 LAB — POCT UA - MICROALBUMIN: Microalbumin Ur, POC: 100 mg/L

## 2016-09-11 MED ORDER — ATORVASTATIN CALCIUM 40 MG PO TABS: 40.0000 mg | ORAL_TABLET | Freq: Every day | ORAL | 1 refills | Status: DC

## 2016-09-11 MED ORDER — UMECLIDINIUM-VILANTEROL 62.5-25 MCG/INH IN AEPB: 1.0000 | INHALATION_SPRAY | Freq: Every day | RESPIRATORY_TRACT | 5 refills | Status: DC

## 2016-09-11 NOTE — Progress Notes (Signed)
Name: Joseph Newman   MRN: [id number]    DOB: April 28, 1945   Date:09/11/2016       Progress Note  Subjective  Chief Complaint  Chief Complaint  Patient presents with  . Diabetes    checks daily 150-160 avg 130 low 190 high seeing Dr. Eddie Dibbles  . Hyperlipidemia  . Obesity    HPI   HTN: he has been compliant with his medications and denies side effects. He is Tribenzor and Coreg daily and bp is at goal, he denies dizziness, palpitation or chest pain.   Hyperlipidemia:he is on Atorvastatin, denies myalgias. Labs done by endo on 02/2016 by Dr. Eddie Dibbles, showed HDL is very low, LDL at goal. Discussed importance of life style change, discussed importance of changing his diet.  COPD:   He is not very physically active and still smokes half a  pack a day, but  not willing to quit at this time. He coughs in am, that is productive, he has exercise intolerance but denies wheezing. Wife hears him coughing during the night, but worse in am.  DM: he sees Dr. Eddie Dibbles, off insulin pump, on Trulicity and [id number] is much better now 7.6% 08/2015 ,  8.0% 02/2016 and last one down to 7.5% at Dr. Jacqualine Code' office.  He denies polyphagia, polydipsia but he has polyuria every 2 hours . He denies blurred vision. He has microalbuminuria and is taking Benicar He is also seeing Dr. Holley Raring. He has ED, [facility] , his  diabetic foot ulcer has healed with the help of Dr. Ellard Artis.   Morbid Obesity: he had lost weight , but is up 7 lbs today, doing well on Trulicity. He states he is not as hungry. He seems depressed to me. No longer going to church, wants to stay at home, he states he does not have anything else to do but smoke. His wife states he is not depressed, engages with the neighbors. Sits most of the day outside in the garage  Patient Active Problem List   Diagnosis Date Noted  . Dyslipidemia associated with type 2 diabetes mellitus ([place]) 03/13/2016  . Diabetic ulcer of toe of right foot associated with type 2 diabetes mellitus,  limited to breakdown of skin ([place]) 03/07/2016  . Microscopic hematuria 05/30/2015  . Foot ulcer, left ([place]) 01/19/2015  . 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 Dyslipidemia 08/31/2014  . Tobacco abuse 08/31/2014  . Diabetes mellitus with renal manifestation ([place]) 08/31/2014  . COPD (chronic obstructive pulmonary disease) ([place]) 08/31/2014  . Microalbuminuria 08/31/2014  . Morbid obesity ([place]) 08/31/2014  . ED (erectile dysfunction) 08/31/2014  . Hypertension   . Dyspnea 10/09/2011    Past Surgical History:  Procedure Laterality Date  . foot surgery      Family History  Problem Relation Age of Onset  . Hypertension Mother   . Kidney disease Neg Hx   . Prostate cancer Neg Hx     Social History   Social History  . Marital status: Married    Spouse name: N/A  . Number of children: N/A  . Years of education: N/A   Occupational History  . Not on file.   Social History Main Topics  . Smoking status: Current Every Day Smoker    Packs/day: 1.00    Years: 30.00    Types: Cigarettes, Cigars    Last attempt to quit: 02/25/2003  . Smokeless tobacco: Never Used  . Alcohol use 0.0 oz/week     Comment: occasional  .  Drug use: No  . Sexual activity: Not Currently   Other Topics Concern  . Not on file   Social History Narrative  . No narrative on file     Current Outpatient Prescriptions:  .  ACCU-CHEK AVIVA PLUS test strip, , Disp: , Rfl:  .  aspirin EC 81 MG tablet, Take 81 mg by mouth daily., Disp: , Rfl:  .  atorvastatin (LIPITOR) 40 MG tablet, Take 1 tablet (40 mg total) by mouth daily., Disp: 90 tablet, Rfl: 1 .  Blood Glucose Monitoring Suppl (ONE TOUCH ULTRA SYSTEM KIT) W/DEVICE KIT, 1 kit by Does not apply route once., Disp: , Rfl:  .  carvedilol (COREG) 6.25 MG tablet, , Disp: , Rfl:  .  Cholecalciferol (VITAMIN D) 2000 UNITS CAPS, Take by mouth., Disp: , Rfl:  .  metFORMIN (GLUCOPHAGE) 1000 MG tablet, TAKE 1 TABLET BY  MOUTH TWICE A DAY, Disp: , Rfl:  .  Olmesartan-Amlodipine-HCTZ 40-10-25 MG TABS, Take 1 tablet by mouth daily., Disp: 90 tablet, Rfl: 1 .  TRULICITY 1.5 ZD/6.3OV SOPN, , Disp: , Rfl:  .  Cyanocobalamin (VITAMIN B-12) 1000 MCG SUBL, Take 1 tablet by mouth daily., Disp: , Rfl:  .  KIONEX 15 GM/60ML suspension, TAKE 120 ML (30GM) BY MOUTH TWICE A DAY FOR 2 DAYS, Disp: , Rfl: 0 .  NEEDLE, DISP, 22 G (B-D DISP NEEDLE 22GX3/4") 22G X 3/4" MISC, by Does not apply route., Disp: , Rfl:  .  patiromer (VELTASSA) 8.4 g packet, Take by mouth., Disp: , Rfl:  .  sodium bicarbonate 650 MG tablet, TAKE TWO (2) TABLETS BY MOUTH TWICE A DAY, Disp: , Rfl: 12  No Known Allergies   ROS  Constitutional: Negative for fever or significant  weight change.  Respiratory: Positive for cough and shortness of breath.   Cardiovascular: Negative for chest pain or palpitations.  Gastrointestinal: Negative for abdominal pain, no bowel changes.  Musculoskeletal: Negative for gait problem or joint swelling.  Skin: Negative for rash.  Neurological: Negative for dizziness or headache.  No other specific complaints in a complete review of systems (except as listed in HPI above).  Objective  There were no vitals filed for this visit.  There is no height or weight on file to calculate BMI.  Physical Exam  Constitutional: Patient appears well-developed and well-nourished. Obese  No distress.  HEENT: head atraumatic, normocephalic, pupils equal and reactive to light,  neck supple, throat within normal limits Cardiovascular: Normal rate, regular rhythm and normal heart sounds.  No murmur heard. No BLE edema. Pulmonary/Chest: Effort normal and scattered rhonchi. No respiratory distress. Abdominal: Soft.  There is no tenderness. Psychiatric: Patient has a normal mood and affect. behavior is normal. Judgment and thought content normal.  Recent Results (from the past 2160 hour(s))  POCT UA - Microalbumin     Status: Abnormal    Collection Time: 09/11/16  9:23 AM  Result Value Ref Range   Microalbumin Ur, POC 100 mg/L   Creatinine, POC  mg/dL   Albumin/Creatinine Ratio, Urine, POC       PHQ2/9: Depression screen [facility] 2/9 03/13/2016 11/10/2015 05/10/2015 11/04/2014 08/31/2014  Decreased Interest 0 0 0 0 0  Down, Depressed, Hopeless 0 0 0 0 0  PHQ - 2 Score 0 0 0 0 0     Fall Risk: Fall Risk  03/13/2016 11/10/2015 05/10/2015 11/04/2014 08/31/2014  Falls in the past year? Yes No No No No  Number falls in past yr: 1 - - - -  Injury with Fall? No - - - -     Assessment & Plan  1. Type 2 diabetes mellitus with microalbuminuria, unspecified whether long term insulin use (HCC)  - POCT UA - Microalbumin  2. Mucopurulent chronic bronchitis ([place])  Still smoking and not ready to quit - umeclidinium-vilanterol (ANORO ELLIPTA) 62.5-25 MCG/INH AEPB; Inhale 1 puff into the lungs daily.  Dispense: 60 each; Refill: 5 - Spirometry: Pre & Post Eval  3. Morbid obesity, unspecified obesity type ([place])  Discussed life style modification   4. Dyslipidemia associated with type 2 diabetes mellitus (HCC)  - atorvastatin (LIPITOR) 40 MG tablet; Take 1 tablet (40 mg total) by mouth daily.  Dispense: 90 tablet; Refill: 1  5. Essential hypertension  At goal, no side effects of medication

## 2016-09-12 ENCOUNTER — Ambulatory Visit (INDEPENDENT_AMBULATORY_CARE_PROVIDER_SITE_OTHER): Payer: Medicare Other | Admitting: Podiatry

## 2016-09-12 ENCOUNTER — Encounter: Payer: Self-pay | Admitting: Podiatry

## 2016-09-12 DIAGNOSIS — L84 Corns and callosities: Secondary | ICD-10-CM

## 2016-09-12 DIAGNOSIS — I70235 Atherosclerosis of native arteries of right leg with ulceration of other part of foot: Secondary | ICD-10-CM

## 2016-09-12 DIAGNOSIS — I70245 Atherosclerosis of native arteries of left leg with ulceration of other part of foot: Secondary | ICD-10-CM

## 2016-09-12 DIAGNOSIS — E0842 Diabetes mellitus due to underlying condition with diabetic polyneuropathy: Secondary | ICD-10-CM

## 2016-09-12 NOTE — Progress Notes (Signed)
   Subjective: Patient with a history of diabetes mellitus presents today for follow-up treatment and evaluation of bilateral pre-ulcerative callus lesions to the sub-2-3 MTPJ of the bilateral feet. At times these lesions ulcerate and he'll and then re-ulcerate. 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 These pre-ulcerations actually appear very stable today. This is the best they've been in a long time. There is currently no open wounds. Patient presents today for follow-up treatment and evaluation  Objective:  Physical Exam General: Alert and oriented x3 in no acute distress  Dermatology: Hyperkeratotic lesion present on the sub-2-3 MPJ bilateral feet. Negative for any pain on palpation. Today after debridement there is no open ulceration noted. Underlying callous tissue is somewhat macerated however. Otherwise intact with no sign of infectious process Patient is completely neuropathic.  Vascular: Palpable pedal pulses bilaterally. No edema or erythema noted. Capillary refill within normal limits.  Neurological: Epicritic and protective threshold absent bilaterally.   Musculoskeletal Exam: Pain on palpation at the keratotic lesion noted. Range of motion within normal limits bilateral. Muscle strength 5/5 in all groups bilateral.  Assessment: #1 diabetes mellitus with peripheral neuropathy #2 pre-ulcerative callous lesions bilateral sub-2-3 MTPJ   Plan of Care:  #1 Patient evaluated #2 Excisional debridement of keratoic lesion using a chisel blade was performed without incident.  #3 light dressing was applied. Continue diabetic shoes with offloading Plastizote inserts. #4 Patient is to return to the clinic in 3 weeks for routine debridement of these lesions  Edrick Kins, DPM [facility]  Dr. Edrick Kins, [address]                                        [place], [place] [address]                Office [phone]  Fax [phone]

## 2016-10-03 ENCOUNTER — Encounter: Payer: Self-pay | Admitting: Podiatry

## 2016-10-03 ENCOUNTER — Ambulatory Visit (INDEPENDENT_AMBULATORY_CARE_PROVIDER_SITE_OTHER): Payer: Medicare Other | Admitting: Podiatry

## 2016-10-03 DIAGNOSIS — L84 Corns and callosities: Secondary | ICD-10-CM

## 2016-10-06 NOTE — Progress Notes (Signed)
   Subjective: Patient with a history of diabetes mellitus presents today for follow-up treatment and evaluation of bilateral pre-ulcerative callus lesions to the sub-2-3 MTPJ of the bilateral feet. At times these lesions ulcerate, heal and then re-ulcerate. 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 These pre-ulcerations actually appear very stable today. This is the best they have appeared in a long time. There are currently no open wounds. Patient presents today for follow-up treatment and evaluation.  Objective:  Physical Exam General: Alert and oriented x3 in no acute distress  Dermatology: Hyperkeratotic lesion present on the sub-2-3 MPJ bilateral feet. Negative for any pain on palpation. Today after debridement there is no open ulceration noted. Underlying callous tissue is somewhat macerated however. Otherwise intact with no sign of infectious process Patient is completely neuropathic.  Vascular: Palpable pedal pulses bilaterally. No edema or erythema noted. Capillary refill within normal limits.  Neurological: Epicritic and protective threshold absent bilaterally.   Musculoskeletal Exam: Pain on palpation at the keratotic lesion noted. Range of motion within normal limits bilateral. Muscle strength 5/5 in all groups bilateral.  Assessment: #1 diabetes mellitus with peripheral neuropathy #2 pre-ulcerative callous lesions bilateral sub-2-3 MTPJ   Plan of Care:  #1 Patient evaluated #2 Excisional debridement of keratoic lesion using a chisel blade was performed without incident.  #3 light dressing was applied. Continue diabetic shoes with offloading Plastizote inserts. #4 Patient is to return to the clinic in 3 weeks for routine debridement of these lesions  Edrick Kins, DPM [facility]  Dr. Edrick Kins, [address]                                        [place], [place] [address]                Office [phone]  Fax  [phone]

## 2016-10-24 ENCOUNTER — Ambulatory Visit (INDEPENDENT_AMBULATORY_CARE_PROVIDER_SITE_OTHER): Payer: Medicare Other | Admitting: Podiatry

## 2016-10-24 ENCOUNTER — Encounter: Payer: Self-pay | Admitting: Podiatry

## 2016-10-24 DIAGNOSIS — L84 Corns and callosities: Secondary | ICD-10-CM

## 2016-10-24 DIAGNOSIS — S91209A Unspecified open wound of unspecified toe(s) with damage to nail, initial encounter: Secondary | ICD-10-CM | POA: Diagnosis not present

## 2016-10-24 DIAGNOSIS — I70235 Atherosclerosis of native arteries of right leg with ulceration of other part of foot: Secondary | ICD-10-CM | POA: Diagnosis not present

## 2016-10-24 DIAGNOSIS — E0842 Diabetes mellitus due to underlying condition with diabetic polyneuropathy: Secondary | ICD-10-CM | POA: Diagnosis not present

## 2016-10-24 DIAGNOSIS — I70245 Atherosclerosis of native arteries of left leg with ulceration of other part of foot: Secondary | ICD-10-CM | POA: Diagnosis not present

## 2016-10-24 NOTE — Progress Notes (Signed)
   Subjective: Patient with a history of diabetes mellitus presents today for follow-up treatment and evaluation of bilateral pre-ulcerative callus lesions to the sub-2-3 MTPJ of the bilateral feet. At times these lesions ulcerate, heal and then re-ulcerate. 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 These pre-ulcerations actually appear very stable today. This is the best they have appeared in a long time. There are currently no open wounds. Patient presents today for follow-up treatment and evaluation. Patient also presents today with a new complaint regarding trauma to the toenail of the fifth digit left foot. The patient cannot recall any injury however there is bleeding from toenail. He presents today for further treatment and evaluation  Objective:  Physical Exam General: Alert and oriented x3 in no acute distress  Dermatology: Hyperkeratotic lesion present on the sub-2-3 MPJ bilateral feet. Negative for any pain on palpation. Today after debridement there is no open ulceration noted. Underlying callous tissue is somewhat macerated however. Otherwise intact with no sign of infectious process Patient is completely neuropathic. Partially detached nail plate of the fifth digit left foot noted with bleeding sanguinous drainage.  Vascular: Palpable pedal pulses bilaterally. No edema or erythema noted. Capillary refill within normal limits.  Neurological: Epicritic and protective threshold absent bilaterally.   Musculoskeletal Exam: Pain on palpation at the keratotic lesion noted. Range of motion within normal limits bilateral. Muscle strength 5/5 in all groups bilateral.  Assessment: #1 diabetes mellitus with peripheral neuropathy #2 pre-ulcerative callous lesions bilateral sub-2-3 MTPJ #3 traumatic nail avulsion with partial detachment fifth digit left foot   Plan of Care:  #1 Patient evaluated #2 Excisional debridement of keratoic lesion using a chisel blade was  performed without incident.  #3 light dressing was applied. Continue diabetic shoes with offloading Plastizote inserts. #4 total temporary nail avulsion was performed of the fifth nail plate left foot. No local anesthesia infiltration was utilized due to the patient's loss of sensation and absent protective threshold secondary to diabetic peripheral neuropathy. The toenail was removed in toto and antibiotic ointment and a Band-Aid was applied. The nail was avulsed using a tissue nipper.  #5 Patient is to return to the clinic in 3 weeks for routine debridement of these lesions  Edrick Kins, DPM [facility]  Dr. Edrick Kins, [address]                                        [place], [place] [address]                Office [phone]  Fax [phone]

## 2016-11-14 ENCOUNTER — Encounter: Payer: Self-pay | Admitting: Podiatry

## 2016-11-14 ENCOUNTER — Ambulatory Visit (INDEPENDENT_AMBULATORY_CARE_PROVIDER_SITE_OTHER): Payer: Medicare Other | Admitting: Podiatry

## 2016-11-14 DIAGNOSIS — L84 Corns and callosities: Secondary | ICD-10-CM | POA: Diagnosis not present

## 2016-11-14 DIAGNOSIS — E0842 Diabetes mellitus due to underlying condition with diabetic polyneuropathy: Secondary | ICD-10-CM | POA: Diagnosis not present

## 2016-11-14 DIAGNOSIS — M79609 Pain in unspecified limb: Secondary | ICD-10-CM | POA: Diagnosis not present

## 2016-11-14 DIAGNOSIS — L97512 Non-pressure chronic ulcer of other part of right foot with fat layer exposed: Secondary | ICD-10-CM | POA: Diagnosis not present

## 2016-11-14 DIAGNOSIS — B351 Tinea unguium: Secondary | ICD-10-CM | POA: Diagnosis not present

## 2016-11-14 DIAGNOSIS — I70235 Atherosclerosis of native arteries of right leg with ulceration of other part of foot: Secondary | ICD-10-CM | POA: Diagnosis not present

## 2016-11-16 NOTE — Progress Notes (Signed)
Subjective: Patient is a 71 y.o. male with PMHx of DM presenting to the office today for follow-up evaluation painful callus lesions to the bilateral feet. He states his left fifth digit looks like it has improved.   Patient also complains of elongated, thickened nails that cause pain while ambulating in shoes. Patient is unable to trim their own nails. Patient presents today for further treatment and evaluation.   Past Medical History:  Diagnosis Date  . Arthritis   . Chronic airway obstruction, not elsewhere classified   . Depressive disorder, not elsewhere classified   . Heart murmur   . Hyperlipidemia   . Hypertension   . Hypertrophy of prostate without urinary obstruction and other lower urinary tract symptoms (LUTS)   . Impotence of organic origin   . Microalbuminuria   . 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 Obesity, unspecified   . Plantar wart   . Sleep apnea   . Synovitis and tenosynovitis, unspecified   . Type II or unspecified type diabetes mellitus without mention of complication, uncontrolled   . Unspecified venous (peripheral) insufficiency     Objective:  Physical Exam General: Alert and oriented x3 in no acute distress  Dermatology: Hyperkeratotic lesion present on the left foot. Pain on palpation with a central nucleated core noted. Skin is warm, dry and supple bilateral lower extremities. Negative for open lesions or macerations. Nails are tender, long, thickened and dystrophic with subungual debris, consistent with onychomycosis, 1-5 bilateral. No signs of infection noted.  Wound #1 noted to the right foot measuring 1.5 x 1.0 x 0.2 cm (LxWxD).   To the noted ulceration(s), there is no eschar. There is a moderate amount of slough, fibrin, and necrotic tissue noted. Granulation tissue and wound base is red. There is a minimal amount of serosanguineous drainage noted. There is no exposed bone muscle-tendon ligament or joint. There is no malodor.  Periwound integrity is intact. Skin is warm, dry and supple bilateral lower extremities.   Vascular: Palpable pedal pulses bilaterally. No edema or erythema noted. Capillary refill within normal limits.  Neurological: Epicritic and protective threshold grossly intact bilaterally.   Musculoskeletal Exam: Pain on palpation at the keratotic lesion noted. Range of motion within normal limits bilateral. Muscle strength 5/5 in all groups bilateral.  Assessment: 1. Onychodystrophic nails 1-5 bilateral with hyperkeratosis of nails.  2. Onychomycosis of nail due to dermatophyte bilateral 3. Pre-ulcerative callous to the left foot 4. Ulceration of the right foot secondary to diabetes mellitus   Plan of Care:  #1 Patient evaluated. #2 Excisional debridement of keratoic lesion using a chisel blade was performed without incident.  #3 Dressed with light dressing. #4 Mechanical debridement of nails 1-5 bilaterally performed using a nail nipper. Filed with dremel without incident.  #5 medically necessary excisional debridement including subcutaneous tissue was performed using a tissue nipper and a chisel blade. Excisional debridement of all the necrotic nonviable tissue down to healthy bleeding viable tissue was performed with post-debridement measurements same as pre-. #6 the wound was cleansed and dry sterile dressing applied. #7 recommended antibiotic ointment and Band-Aid daily to ulcer. #8 return to clinic in 3 weeks.    Edrick Kins, DPM [facility]  Dr. Edrick Kins, DPM    [address]  [place], [place] [address]                Office [phone]  Fax [phone]   

## 2016-11-27 ENCOUNTER — Ambulatory Visit (INDEPENDENT_AMBULATORY_CARE_PROVIDER_SITE_OTHER): Payer: Medicare Other

## 2016-11-27 DIAGNOSIS — Z23 Encounter for immunization: Secondary | ICD-10-CM | POA: Diagnosis not present

## 2016-11-29 ENCOUNTER — Ambulatory Visit: Payer: Medicare (Managed Care)

## 2016-12-05 ENCOUNTER — Ambulatory Visit: Payer: Medicare Other | Admitting: Podiatry

## 2016-12-05 ENCOUNTER — Encounter: Payer: Self-pay | Admitting: Podiatry

## 2016-12-05 DIAGNOSIS — L989 Disorder of the skin and subcutaneous tissue, unspecified: Secondary | ICD-10-CM

## 2016-12-05 DIAGNOSIS — L84 Corns and callosities: Secondary | ICD-10-CM | POA: Diagnosis not present

## 2016-12-07 NOTE — Progress Notes (Signed)
Subjective: Patient is a 71 y.o. male with PMHx of DM presenting to the office today for follow-up evaluation of painful callus lesions to the bilateral feet. He is here for further evaluation and treatment. Patient also complains of elongated, thickened nails that cause pain while ambulating in shoes. Patient is unable to trim their own nails.    Past Medical History:  Diagnosis Date  . Arthritis   . Chronic airway obstruction, not elsewhere classified   . Depressive disorder, not elsewhere classified   . Heart murmur   . Hyperlipidemia   . Hypertension   . Hypertrophy of prostate without urinary obstruction and other lower urinary tract symptoms (LUTS)   . Impotence of organic origin   . Microalbuminuria   . 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 Obesity, unspecified   . Plantar wart   . Sleep apnea   . Synovitis and tenosynovitis, unspecified   . Type II or unspecified type diabetes mellitus without mention of complication, uncontrolled   . Unspecified venous (peripheral) insufficiency     Objective:  Physical Exam General: Alert and oriented x3 in no acute distress  Dermatology: Hyperkeratotic lesion present on the left foot. Pain on palpation with a central nucleated core noted. Skin is warm, dry and supple bilateral lower extremities. Negative for open lesions or macerations. Nails are tender, long, thickened and dystrophic with subungual debris, consistent with onychomycosis, 1-5 bilateral. No signs of infection noted.  Wound #1 noted to the right foot measuring 1.5 x 1.0 x 0.2 cm (LxWxD).   To the noted ulceration(s), there is no eschar. There is a moderate amount of slough, fibrin, and necrotic tissue noted. Granulation tissue and wound base is red. There is a minimal amount of serosanguineous drainage noted. There is no exposed bone muscle-tendon ligament or joint. There is no malodor. Periwound integrity is intact. Skin is warm, dry and supple bilateral  lower extremities.   Vascular: Palpable pedal pulses bilaterally. No edema or erythema noted. Capillary refill within normal limits.  Neurological: Epicritic and protective threshold grossly intact bilaterally.   Musculoskeletal Exam: Pain on palpation at the keratotic lesion noted. Range of motion within normal limits bilateral. Muscle strength 5/5 in all groups bilateral.  Assessment: 1. Onychodystrophic nails 1-5 bilateral with hyperkeratosis of nails.  2. Onychomycosis of nail due to dermatophyte bilateral 3. Pre-ulcerative callous to the left foot 4. Ulceration of the right foot secondary to diabetes mellitus   Plan of Care:  #1 Patient evaluated. #2 Excisional debridement of keratoic lesion using a chisel blade was performed without incident.  #3 Dressed with light dressing. #4 Mechanical debridement of nails 1-5 bilaterally performed using a nail nipper. Filed with dremel without incident.  #5 medically necessary excisional debridement including subcutaneous tissue was performed using a tissue nipper and a chisel blade. Excisional debridement of all the necrotic nonviable tissue down to healthy bleeding viable tissue was performed with post-debridement measurements same as pre-. #6 the wound was cleansed and dry sterile dressing applied. #7 recommended antibiotic ointment and Band-Aid daily to ulcer. #8 return to clinic in 3 weeks.    Edrick Kins, DPM [facility]  Dr. Edrick Kins, [address]                                        [place], [place] [address]  Office [phone]  Fax [phone]

## 2016-12-26 ENCOUNTER — Ambulatory Visit: Payer: Medicare Other | Admitting: Podiatry

## 2016-12-26 ENCOUNTER — Encounter: Payer: Self-pay | Admitting: Podiatry

## 2016-12-26 DIAGNOSIS — L989 Disorder of the skin and subcutaneous tissue, unspecified: Secondary | ICD-10-CM

## 2016-12-28 NOTE — Progress Notes (Signed)
   Subjective: 71 year old male presenting today for follow-up evaluation of calluses to the plantar aspect of bilateral feet.  He states the calluses have been present for quite some time and are causing pain when he walks. Patient presents today for further treatment and evaluation.   Past Medical History:  Diagnosis Date  . Arthritis   . Chronic airway obstruction, not elsewhere classified   . Depressive disorder, not elsewhere classified   . Heart murmur   . Hyperlipidemia   . Hypertension   . Hypertrophy of prostate without urinary obstruction and other lower urinary tract symptoms (LUTS)   . Impotence of organic origin   . Microalbuminuria   . 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 Obesity, unspecified   . Plantar wart   . Sleep apnea   . Synovitis and tenosynovitis, unspecified   . Type II or unspecified type diabetes mellitus without mention of complication, uncontrolled   . Unspecified venous (peripheral) insufficiency      Objective:  Physical Exam General: Alert and oriented x3 in no acute distress  Dermatology: Hyperkeratotic lesion present on the bilateral feet x3. Pain on palpation with a central nucleated core noted.  Skin is warm, dry and supple bilateral lower extremities. Negative for open lesions or macerations.  Vascular: Palpable pedal pulses bilaterally. No edema or erythema noted. Capillary refill within normal limits.  Neurological: Epicritic and protective threshold diminished bilaterally.   Musculoskeletal Exam: Pain on palpation at the keratotic lesion noted. Range of motion within normal limits bilateral. Muscle strength 5/5 in all groups bilateral.  Assessment: #1  Pre-ulcerative calluses bilaterally x3   Plan of Care:  #1 Patient evaluated #2 Excisional debridement of keratotic lesions x 3 using a chisel blade was performed without incident.  #3 Dressed area with light dressing. #4 Patient is to return to the clinic in 3 weeks.     Edrick Kins, DPM [facility]  Dr. Edrick Kins, [address]                                        [place], [place] [address]                Office [phone]  Fax [phone]

## 2017-01-12 ENCOUNTER — Ambulatory Visit: Payer: Medicare Other | Admitting: Podiatry

## 2017-01-12 DIAGNOSIS — L989 Disorder of the skin and subcutaneous tissue, unspecified: Secondary | ICD-10-CM

## 2017-01-18 NOTE — Progress Notes (Signed)
   Subjective: 71 year old male presenting today for follow-up evaluation of calluses to the plantar aspect of bilateral feet.  He states he is doing well overall.  Walking still increases the pain.  Resting the feet help alleviate it.  Patient presents today for further treatment and evaluation.   Past Medical History:  Diagnosis Date  . Arthritis   . Chronic airway obstruction, not elsewhere classified   . Depressive disorder, not elsewhere classified   . Heart murmur   . Hyperlipidemia   . Hypertension   . Hypertrophy of prostate without urinary obstruction and other lower urinary tract symptoms (LUTS)   . Impotence of organic origin   . Microalbuminuria   . 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 Obesity, unspecified   . Plantar wart   . Sleep apnea   . Synovitis and tenosynovitis, unspecified   . Type II or unspecified type diabetes mellitus without mention of complication, uncontrolled   . Unspecified venous (peripheral) insufficiency      Objective:  Physical Exam General: Alert and oriented x3 in no acute distress  Dermatology: Hyperkeratotic lesion present on the bilateral feet x3. Pain on palpation with a central nucleated core noted.  Skin is warm, dry and supple bilateral lower extremities. Negative for open lesions or macerations.  Vascular: Palpable pedal pulses bilaterally. No edema or erythema noted. Capillary refill within normal limits.  Neurological: Epicritic and protective threshold diminished bilaterally.   Musculoskeletal Exam: Pain on palpation at the keratotic lesion noted. Range of motion within normal limits bilateral. Muscle strength 5/5 in all groups bilateral.  Assessment: #1  Pre-ulcerative calluses bilaterally x3   Plan of Care:  #1 Patient evaluated #2 Excisional debridement of keratotic lesions x 3 using a chisel blade was performed without incident.  #3 Dressed area with light dressing. #4 Patient is to return to the clinic in  3 weeks.    Edrick Kins, DPM [facility]  Dr. Edrick Kins, [address]                                        [place], [place] [address]                Office [phone]  Fax [phone]

## 2017-02-02 ENCOUNTER — Encounter: Payer: Self-pay | Admitting: Podiatry

## 2017-02-02 ENCOUNTER — Ambulatory Visit: Payer: Medicare Other | Admitting: Podiatry

## 2017-02-02 DIAGNOSIS — E0842 Diabetes mellitus due to underlying condition with diabetic polyneuropathy: Secondary | ICD-10-CM

## 2017-02-02 DIAGNOSIS — I70235 Atherosclerosis of native arteries of right leg with ulceration of other part of foot: Secondary | ICD-10-CM | POA: Diagnosis not present

## 2017-02-02 DIAGNOSIS — I70245 Atherosclerosis of native arteries of left leg with ulceration of other part of foot: Secondary | ICD-10-CM

## 2017-02-02 DIAGNOSIS — L97522 Non-pressure chronic ulcer of other part of left foot with fat layer exposed: Secondary | ICD-10-CM

## 2017-02-02 DIAGNOSIS — L97512 Non-pressure chronic ulcer of other part of right foot with fat layer exposed: Secondary | ICD-10-CM

## 2017-02-02 MED ORDER — GENTAMICIN SULFATE 0.1 % EX CREA: 1.0000 "application " | TOPICAL_CREAM | Freq: Three times a day (TID) | CUTANEOUS | 1 refills | Status: DC

## 2017-02-05 NOTE — Progress Notes (Signed)
   Subjective:  Patient presents today for follow-up evaluation of callus lesions of bilateral feet.  He states they have worsened.  He has not done anything for treatment.  Walking increases the pain.  There are no alleviating factors noted.  Patient is here for further evaluation and treatment.   Past Medical History:  Diagnosis Date  . Arthritis   . Chronic airway obstruction, not elsewhere classified   . Depressive disorder, not elsewhere classified   . Heart murmur   . Hyperlipidemia   . Hypertension   . Hypertrophy of prostate without urinary obstruction and other lower urinary tract symptoms (LUTS)   . Impotence of organic origin   . Microalbuminuria   . 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 Obesity, unspecified   . Plantar wart   . Sleep apnea   . Synovitis and tenosynovitis, unspecified   . Type II or unspecified type diabetes mellitus without mention of complication, uncontrolled   . Unspecified venous (peripheral) insufficiency      Objective/Physical Exam General: The patient is alert and oriented x3 in no acute distress.  Dermatology:  Wound #1 noted to the left sub-third MPJ measuring 1.5 x 1.0 x 0.2 cm (LxWxD).   Wound #2 noted to the right sub-third MPJ measuring 1.5 x 1.0 x 0.2 cm  To the noted ulceration(s), there is no eschar. There is a moderate amount of slough, fibrin, and necrotic tissue noted. Granulation tissue and wound base is red. There is a minimal amount of serosanguineous drainage noted. There is no exposed bone muscle-tendon ligament or joint. There is no malodor. Periwound integrity is intact. Skin is warm, dry and supple bilateral lower extremities.  Vascular: Palpable pedal pulses bilaterally. Mild edema noted. Capillary refill within normal limits.   Neurological: Epicritic and protective threshold absent bilaterally.   Musculoskeletal Exam: Range of motion within normal limits to all pedal and ankle joints bilateral. Muscle  strength 5/5 in all groups bilateral.   Assessment: #1  Ulceration of bilateral sub-third MPJ  Plan of Care:  #1 Patient was evaluated. #2 medically necessary excisional debridement including subcutaneous tissue was performed using a tissue nipper and a chisel blade. Excisional debridement of all the necrotic nonviable tissue down to healthy bleeding viable tissue was performed with post-debridement measurements same as pre-. #3 the wound was cleansed with normal saline. #4  Prescription for gentamicin cream for daily use with a Band-Aid provided to patient. #5 continue wearing diabetic shoes. #6 return to clinic in 3 weeks.   Edrick Kins, DPM [facility]  Dr. Edrick Kins, [address]                                        [place], [place] [address]                Office [phone]  Fax [phone]

## 2017-02-09 ENCOUNTER — Other Ambulatory Visit: Payer: Self-pay | Admitting: Family Medicine

## 2017-02-09 NOTE — Telephone Encounter (Signed)
Refill request for Hypertension medication:  Olmesartan-Amlodipine-HCTZ 40-10-25  Last office visit pertaining to hypertension: 09/11/2016  BP Readings from Last 3 Encounters:  09/11/16 128/70  03/13/16 128/74  11/10/15 138/68     Lab Results  Component Value Date   CREATININE 1.4 (A) 03/17/2016   BUN 29 (A) 03/17/2016   NA 142 03/17/2016   K 4.6 03/17/2016   CL 113 (H) 03/13/2016   CO2 18 (L) 03/13/2016     Follow-up on file. None indicated

## 2017-02-23 ENCOUNTER — Encounter: Payer: Self-pay | Admitting: Podiatry

## 2017-02-23 ENCOUNTER — Ambulatory Visit: Payer: Medicare Other | Admitting: Podiatry

## 2017-02-23 DIAGNOSIS — E0842 Diabetes mellitus due to underlying condition with diabetic polyneuropathy: Secondary | ICD-10-CM | POA: Diagnosis not present

## 2017-02-23 DIAGNOSIS — L989 Disorder of the skin and subcutaneous tissue, unspecified: Secondary | ICD-10-CM | POA: Diagnosis not present

## 2017-02-23 DIAGNOSIS — M79676 Pain in unspecified toe(s): Secondary | ICD-10-CM | POA: Diagnosis not present

## 2017-02-23 DIAGNOSIS — B351 Tinea unguium: Secondary | ICD-10-CM

## 2017-02-25 NOTE — Progress Notes (Signed)
    Subjective: Patient is a 72 y.o. male presenting to the office today with a chief complaint of painful callus lesions to the bilateral feet that have been present for several months. He states he has been applying gentamicin cream as directed.   Patient also complains of elongated, thickened nails that cause pain while ambulating in shoes. Patient is unable to trim their own nails. Patient presents today for further treatment and evaluation.  Past Medical History:  Diagnosis Date  . Arthritis   . Chronic airway obstruction, not elsewhere classified   . Depressive disorder, not elsewhere classified   . Heart murmur   . Hyperlipidemia   . Hypertension   . Hypertrophy of prostate without urinary obstruction and other lower urinary tract symptoms (LUTS)   . Impotence of organic origin   . Microalbuminuria   . 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 Obesity, unspecified   . Plantar wart   . Sleep apnea   . Synovitis and tenosynovitis, unspecified   . Type II or unspecified type diabetes mellitus without mention of complication, uncontrolled   . Unspecified venous (peripheral) insufficiency     Objective:  Physical Exam General: Alert and oriented x3 in no acute distress  Dermatology: Hyperkeratotic lesions present on the bilateral feet x 2. Pain on palpation with a central nucleated core noted. Skin is warm, dry and supple bilateral lower extremities. Negative for open lesions or macerations. Nails are tender, long, thickened and dystrophic with subungual debris, consistent with onychomycosis, 1-5 bilateral. No signs of infection noted.  Vascular: Palpable pedal pulses bilaterally. No edema or erythema noted. Capillary refill within normal limits.  Neurological: Epicritic and protective threshold grossly intact bilaterally.   Musculoskeletal Exam: Pain on palpation at the keratotic lesion noted. Range of motion within normal limits bilateral. Muscle strength 5/5 in all  groups bilateral.  Assessment: 1. Onychodystrophic nails 1-5 bilateral with hyperkeratosis of nails.  2. Onychomycosis of nail due to dermatophyte bilateral 3. Pre-ulcerative callus lesions to the bilateral feet x 2   Plan of Care:  #1 Patient evaluated. #2 Excisional debridement of keratoic lesion using a chisel blade was performed without incident.  #3 Dressed with light dressing. #4 Mechanical debridement of nails 1-5 bilaterally performed using a nail nipper. Filed with dremel without incident.  #5 Patient is to return to the clinic in 3 weeks.   Edrick Kins, DPM [facility]  Dr. Edrick Kins, [address]                                        [place], [place] [address]                Office [phone]  Fax [phone]

## 2017-03-16 ENCOUNTER — Ambulatory Visit: Payer: Medicare Other | Admitting: Podiatry

## 2017-03-16 ENCOUNTER — Encounter: Payer: Self-pay | Admitting: Podiatry

## 2017-03-16 DIAGNOSIS — I70235 Atherosclerosis of native arteries of right leg with ulceration of other part of foot: Secondary | ICD-10-CM

## 2017-03-16 DIAGNOSIS — I70245 Atherosclerosis of native arteries of left leg with ulceration of other part of foot: Secondary | ICD-10-CM

## 2017-03-16 DIAGNOSIS — E0842 Diabetes mellitus due to underlying condition with diabetic polyneuropathy: Secondary | ICD-10-CM

## 2017-03-16 DIAGNOSIS — L97512 Non-pressure chronic ulcer of other part of right foot with fat layer exposed: Secondary | ICD-10-CM

## 2017-03-16 DIAGNOSIS — L84 Corns and callosities: Secondary | ICD-10-CM

## 2017-03-16 DIAGNOSIS — L97522 Non-pressure chronic ulcer of other part of left foot with fat layer exposed: Secondary | ICD-10-CM

## 2017-03-18 NOTE — Progress Notes (Signed)
   Subjective: 72 year old male presenting today for follow up evaluation of painful callus lesions to the bilateral feet. He reports a blister has now formed on the left great toe and he notes a lesion to the sub-3rd MPJ of bilateral feet. He has not done anything to treat the areas. Ambulation increases the pain. Patient presents today for further treatment and evaluation.   Past Medical History:  Diagnosis Date  . Arthritis   . Chronic airway obstruction, not elsewhere classified   . Depressive disorder, not elsewhere classified   . Heart murmur   . Hyperlipidemia   . Hypertension   . Hypertrophy of prostate without urinary obstruction and other lower urinary tract symptoms (LUTS)   . Impotence of organic origin   . Microalbuminuria   . 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 Obesity, unspecified   . Plantar wart   . Sleep apnea   . Synovitis and tenosynovitis, unspecified   . Type II or unspecified type diabetes mellitus without mention of complication, uncontrolled   . Unspecified venous (peripheral) insufficiency      Objective:  Physical Exam General: Alert and oriented x3 in no acute distress  Dermatology: Hyperkeratotic lesions present on the bilateral feet x 2. Pain on palpation with a central nucleated core noted. Fluctuant intact blister lesion noted to the left great toe. Skin is warm, dry and supple bilateral lower extremities. Negative for open lesions or macerations.  Wound #1 noted to the bilateral sub-3rd MPJs measuring approximately 1 cm in diameter times 0.3 cm in depth  To the above-noted ulceration, there is no eschar. There is a moderate amount of slough, fibrin and necrotic tissue. Granulation tissue and wound base is red. There is no malodor. There is a minimal amount of serosanginous drainage noted. Periwound integrity is intact.   Vascular: Palpable pedal pulses bilaterally. No edema or erythema noted. Capillary refill within normal  limits.  Neurological: Epicritic and protective threshold diminished bilaterally.   Musculoskeletal Exam: Pain on palpation at the keratotic lesions noted. Range of motion within normal limits bilateral. Muscle strength 5/5 in all groups bilateral.  Assessment: #1 Diabetes mellitus w/ peripheral neuropathy #2 Pre-ulcerative callus lesions bilateral feet x 2 #3 Ulceration bilateral sub-3rd MPJs secondary to diabetes mellitus #4 intact blister lesion left great toe   Plan of Care:  #1 Patient evaluated. #2 Excisional debridement of keratotic lesions using a chisel blade was performed without incident.  #3 Dressed area with light dressing. #4 Medically necessary excisional debridement including subcutaneous tissue was performed using a tissue nipper and a chisel blade. Excisional debridement of all the necrotic nonviable tissue down to healthy bleeding viable tissue was performed with post-debridement measurements same as pre-. #5 The wound was cleansed and dry sterile dressing applied. #6 Recommended betadine to left great toe blister.  #7 Return to clinic in 3 weeks.    Edrick Kins, DPM [facility]  Dr. Edrick Kins, [address]                                        [place], [place] [address]                Office [phone]  Fax [phone]

## 2017-03-23 ENCOUNTER — Ambulatory Visit: Payer: Medicare Other | Admitting: Podiatry

## 2017-04-03 ENCOUNTER — Other Ambulatory Visit: Payer: Self-pay | Admitting: Nephrology

## 2017-04-03 DIAGNOSIS — R809 Proteinuria, unspecified: Secondary | ICD-10-CM

## 2017-04-04 ENCOUNTER — Telehealth (HOSPITAL_COMMUNITY): Payer: Self-pay | Admitting: Nephrology

## 2017-04-05 ENCOUNTER — Other Ambulatory Visit: Payer: Self-pay | Admitting: Nephrology

## 2017-04-05 ENCOUNTER — Ambulatory Visit: Payer: Medicare Other

## 2017-04-05 ENCOUNTER — Ambulatory Visit
Admission: RE | Admit: 2017-04-05 | Discharge: 2017-04-05 | Disposition: A | Discharge status: Home / Self Care | Payer: Medicare Other | Source: Ambulatory Visit | Attending: Nephrology | Admitting: Nephrology

## 2017-04-05 DIAGNOSIS — N281 Cyst of kidney, acquired: Secondary | ICD-10-CM | POA: Insufficient documentation

## 2017-04-05 DIAGNOSIS — N1330 Unspecified hydronephrosis: Secondary | ICD-10-CM

## 2017-04-06 ENCOUNTER — Encounter: Payer: Self-pay | Admitting: Podiatry

## 2017-04-06 ENCOUNTER — Ambulatory Visit: Payer: Medicare Other | Admitting: Podiatry

## 2017-04-06 DIAGNOSIS — E0842 Diabetes mellitus due to underlying condition with diabetic polyneuropathy: Secondary | ICD-10-CM

## 2017-04-06 DIAGNOSIS — I70235 Atherosclerosis of native arteries of right leg with ulceration of other part of foot: Secondary | ICD-10-CM

## 2017-04-06 DIAGNOSIS — L97512 Non-pressure chronic ulcer of other part of right foot with fat layer exposed: Secondary | ICD-10-CM

## 2017-04-06 DIAGNOSIS — I70245 Atherosclerosis of native arteries of left leg with ulceration of other part of foot: Secondary | ICD-10-CM

## 2017-04-06 DIAGNOSIS — L97522 Non-pressure chronic ulcer of other part of left foot with fat layer exposed: Secondary | ICD-10-CM

## 2017-04-06 NOTE — Progress Notes (Signed)
   Subjective:  72 year old male presenting today for follow up evaluation of painful lesions of bilateral feet. He states they have not changed since his last visit. He denies any modifying factors. He has been applying gentamicin cream daily and covering with a Band-Aid. Patient is here for further evaluation and treatment.   Past Medical History:  Diagnosis Date  . Arthritis   . Chronic airway obstruction, not elsewhere classified   . Depressive disorder, not elsewhere classified   . Heart murmur   . Hyperlipidemia   . Hypertension   . Hypertrophy of prostate without urinary obstruction and other lower urinary tract symptoms (LUTS)   . Impotence of organic origin   . Microalbuminuria   . 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 Obesity, unspecified   . Plantar wart   . Sleep apnea   . Synovitis and tenosynovitis, unspecified   . Type II or unspecified type diabetes mellitus without mention of complication, uncontrolled   . Unspecified venous (peripheral) insufficiency      Objective/Physical Exam General: The patient is alert and oriented x3 in no acute distress.  Dermatology:  Wound #1 noted to the bilateral sub-third MPJs measuring 0.5 x 0.5 x 0.2 cm (LxWxD).   To the noted ulceration(s), there is no eschar. There is a moderate amount of slough, fibrin, and necrotic tissue noted. Granulation tissue and wound base is red. There is a minimal amount of serosanguineous drainage noted. There is no exposed bone muscle-tendon ligament or joint. There is no malodor. Periwound integrity is intact. Skin is warm, dry and supple bilateral lower extremities.  Vascular: Palpable pedal pulses bilaterally. No edema or erythema noted. Capillary refill within normal limits.  Neurological: Epicritic and protective threshold absent bilaterally.   Musculoskeletal Exam: Range of motion within normal limits to all pedal and ankle joints bilateral. Muscle strength 5/5 in all groups  bilateral.   Assessment: #1 bilateral feet secondary to diabetes mellitus #2 diabetes mellitus w/ peripheral neuropathy   Plan of Care:  #1 Patient was evaluated. #2 medically necessary excisional debridement including subcutaneous tissue was performed using a tissue nipper and a chisel blade. Excisional debridement of all the necrotic nonviable tissue down to healthy bleeding viable tissue was performed with post-debridement measurements same as pre-. #3 the wound was cleansed and dry sterile dressing applied. #4 Continue applying gentamicin cream daily with a Band-Aid.  #5 Authorization request for new DM shoes/insoles placed today.  #6 Return to clinic in 3 weeks.    Edrick Kins, DPM [facility]  Dr. Edrick Kins, [address]                                        [place], [place] [address]                Office [phone]  Fax [phone]

## 2017-04-27 ENCOUNTER — Encounter: Payer: Self-pay | Admitting: Podiatry

## 2017-04-27 ENCOUNTER — Ambulatory Visit: Payer: Medicare Other | Admitting: Podiatry

## 2017-04-27 DIAGNOSIS — L97512 Non-pressure chronic ulcer of other part of right foot with fat layer exposed: Secondary | ICD-10-CM | POA: Diagnosis not present

## 2017-04-27 DIAGNOSIS — E0842 Diabetes mellitus due to underlying condition with diabetic polyneuropathy: Secondary | ICD-10-CM

## 2017-04-27 DIAGNOSIS — I70235 Atherosclerosis of native arteries of right leg with ulceration of other part of foot: Secondary | ICD-10-CM

## 2017-04-27 DIAGNOSIS — L84 Corns and callosities: Secondary | ICD-10-CM

## 2017-04-30 NOTE — Progress Notes (Signed)
   Subjective:  72 year old male presenting today for follow up evaluation of painful lesions of bilateral feet. He denies any change since his previous visit. There are no modifying factors noted. He has been applying gentamicin cream daily and covering with a Band-Aid as well as wearing his DM shoes. Patient is here for further evaluation and treatment.   Past Medical History:  Diagnosis Date  . Arthritis   . Chronic airway obstruction, not elsewhere classified   . Depressive disorder, not elsewhere classified   . Heart murmur   . Hyperlipidemia   . Hypertension   . Hypertrophy of prostate without urinary obstruction and other lower urinary tract symptoms (LUTS)   . Impotence of organic origin   . Microalbuminuria   . 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 Obesity, unspecified   . Plantar wart   . Sleep apnea   . Synovitis and tenosynovitis, unspecified   . Type II or unspecified type diabetes mellitus without mention of complication, uncontrolled   . Unspecified venous (peripheral) insufficiency      Objective/Physical Exam General: The patient is alert and oriented x3 in no acute distress.  Dermatology:  Wound #1 noted to the right foot measuring 1.1 x 1.3 x 0.2 cm (LxWxD).   To the noted ulceration(s), there is no eschar. There is a moderate amount of slough, fibrin, and necrotic tissue noted. Granulation tissue and wound base is red. There is a minimal amount of serosanguineous drainage noted. There is no exposed bone muscle-tendon ligament or joint. There is no malodor. Periwound integrity is intact. Hyperkeratotic lesion present on the left foot. Pain on palpation with a central nucleated core noted. Skin is warm, dry and supple bilateral lower extremities.  Vascular: Palpable pedal pulses bilaterally. No edema or erythema noted. Capillary refill within normal limits.  Neurological: Epicritic and protective threshold diminished bilaterally.   Musculoskeletal  Exam: Range of motion within normal limits to all pedal and ankle joints bilateral. Muscle strength 5/5 in all groups bilateral.   Assessment: #1 ulceration noted to the right foot secondary to diabetes mellitus #2 Pre-ulcerative callus lesion noted to the left foot  #3 diabetes mellitus w/ peripheral neuropathy   Plan of Care:  #1 Patient was evaluated. #2 medically necessary excisional debridement including subcutaneous tissue was performed using a tissue nipper and a chisel blade. Excisional debridement of all the necrotic nonviable tissue down to healthy bleeding viable tissue was performed with post-debridement measurements same as pre-. #3 the wound was cleansed and dry sterile dressing applied. #4 Excisional debridement of keratoic lesion using a chisel blade was performed without incident. Light dressing applied. #5 Continue applying gentamicin cream daily with a Band-Aid to the ulceration of the right foot.  #6 Continue wearing DM shoes.  #7 Return to clinic in 3 weeks.     Edrick Kins, DPM [facility]  Dr. Edrick Kins, [address]                                        [place], [place] [address]                Office [phone]  Fax [phone]

## 2017-05-03 ENCOUNTER — Other Ambulatory Visit: Payer: Self-pay

## 2017-05-03 DIAGNOSIS — E1169 Type 2 diabetes mellitus with other specified complication: Secondary | ICD-10-CM

## 2017-05-03 DIAGNOSIS — E785 Hyperlipidemia, unspecified: Principal | ICD-10-CM

## 2017-05-03 DIAGNOSIS — N182 Chronic kidney disease, stage 2 (mild): Secondary | ICD-10-CM

## 2017-05-03 HISTORY — DX: Chronic kidney disease, stage 2 (mild): N18.2

## 2017-05-03 MED ORDER — ATORVASTATIN CALCIUM 40 MG PO TABS: 40.0000 mg | ORAL_TABLET | Freq: Every day | ORAL | 0 refills | Status: DC

## 2017-05-03 NOTE — Telephone Encounter (Signed)
Last SGPT was in 2017 I will approve 30 days of statin in primary's absence

## 2017-05-03 NOTE — Telephone Encounter (Signed)
Refill Request for Cholesterol medication. Atorvastatin 40 mg  Last physical: None indicated   Lab Results  Component Value Date   CHOL 131 06/18/2014   HDL 23 (A) 06/18/2014   LDLCALC 57 06/18/2014   TRIG 259 (A) 06/18/2014    Follow up visit: 05/18/2017

## 2017-05-18 ENCOUNTER — Ambulatory Visit: Payer: Medicare Other | Admitting: Podiatry

## 2017-05-18 ENCOUNTER — Encounter: Payer: Self-pay | Admitting: Podiatry

## 2017-05-18 DIAGNOSIS — L84 Corns and callosities: Secondary | ICD-10-CM

## 2017-05-18 DIAGNOSIS — E0842 Diabetes mellitus due to underlying condition with diabetic polyneuropathy: Secondary | ICD-10-CM | POA: Diagnosis not present

## 2017-05-21 NOTE — Progress Notes (Signed)
   Subjective: 72 year old male with PMHx of T2DM presenting today for follow up evaluation of painful callus lesions to bilateral feet. He states the areas have not changed since his last visit. Walking and bearing weight increases the pain. He has not had any treatment at home. Patient is here for further evaluation and treatment.   Past Medical History:  Diagnosis Date  . Arthritis   . Chronic airway obstruction, not elsewhere classified   . 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 Holley Raring, nephrologist  . Depressive disorder, not elsewhere classified   . Heart murmur   . Hyperlipidemia   . Hypertension   . Hypertrophy of prostate without urinary obstruction and other lower urinary tract symptoms (LUTS)   . Impotence of organic origin   . Microalbuminuria   . 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 Obesity, unspecified   . Plantar wart   . Sleep apnea   . Synovitis and tenosynovitis, unspecified   . Type II or unspecified type diabetes mellitus without mention of complication, uncontrolled   . Unspecified venous (peripheral) insufficiency     Objective:  Physical Exam General: Alert and oriented x3 in no acute distress  Dermatology: Hyperkeratotic lesions present on the bilateral feet. Pain on palpation with a central nucleated core noted.  Skin is warm, dry and supple bilateral lower extremities. Negative for open lesions or macerations.  Vascular: Palpable pedal pulses bilaterally. No edema or erythema noted. Capillary refill within normal limits.  Neurological: Epicritic and protective threshold diminished bilaterally.   Musculoskeletal Exam: Pain on palpation at the keratotic lesion noted. Range of motion within normal limits bilateral. Muscle strength 5/5 in all groups bilateral.  Assessment: #1 Diabetes mellitus w/ peripheral neuropathy #2 Pre-ulcerative callus lesions bilateral plantar feet #3 Pain in feet   Plan of Care:    #1 Patient evaluated #2 Excisional debridement of keratotic lesion using a chisel blade was performed without incident.  #3 Dressed area with light dressing. #4 Patient is to return to the clinic in 3 weeks.    Edrick Kins, DPM [facility]  Dr. Edrick Kins, [address]                                        [place], [place] [address]                Office [phone]  Fax [phone]

## 2017-05-29 ENCOUNTER — Other Ambulatory Visit: Payer: Self-pay | Admitting: Family Medicine

## 2017-05-29 DIAGNOSIS — E1169 Type 2 diabetes mellitus with other specified complication: Secondary | ICD-10-CM

## 2017-05-29 DIAGNOSIS — E785 Hyperlipidemia, unspecified: Principal | ICD-10-CM

## 2017-05-29 MED ORDER — ATORVASTATIN CALCIUM 40 MG PO TABS: 40.0000 mg | ORAL_TABLET | Freq: Every day | ORAL | 0 refills | Status: DC

## 2017-05-29 NOTE — Telephone Encounter (Signed)
He needs follow up, last seen 08/2016, sending 30 days

## 2017-06-06 ENCOUNTER — Telehealth: Payer: Self-pay

## 2017-06-06 NOTE — Telephone Encounter (Signed)
Copied from [place] [id number]. Topic: General - Call Back - No Documentation >> Jun 06, 2017  1:45 PM Arletha Grippe wrote: Reason for CRM: wife francis Timbrook is returning call ( she says about medications)  No crm  Cb is [phone]

## 2017-06-06 NOTE — Telephone Encounter (Signed)
LVM for pt to call the office to schedule an appt. °

## 2017-06-08 ENCOUNTER — Ambulatory Visit: Payer: Medicare Other | Admitting: Podiatry

## 2017-06-08 DIAGNOSIS — I70235 Atherosclerosis of native arteries of right leg with ulceration of other part of foot: Secondary | ICD-10-CM

## 2017-06-08 DIAGNOSIS — L97512 Non-pressure chronic ulcer of other part of right foot with fat layer exposed: Secondary | ICD-10-CM | POA: Diagnosis not present

## 2017-06-08 DIAGNOSIS — L97522 Non-pressure chronic ulcer of other part of left foot with fat layer exposed: Secondary | ICD-10-CM

## 2017-06-08 DIAGNOSIS — E0842 Diabetes mellitus due to underlying condition with diabetic polyneuropathy: Secondary | ICD-10-CM

## 2017-06-11 NOTE — Progress Notes (Signed)
   Subjective:  72 year old male presenting today for follow up evaluation of calluses to bilateral feet. He states the lesion are about the same. Walking increases the pain. Patient is here for further evaluation and treatment.   Past Medical History:  Diagnosis Date  . Arthritis   . Chronic airway obstruction, not elsewhere classified   . 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 Holley Raring, nephrologist  . Depressive disorder, not elsewhere classified   . Heart murmur   . Hyperlipidemia   . Hypertension   . Hypertrophy of prostate without urinary obstruction and other lower urinary tract symptoms (LUTS)   . Impotence of organic origin   . Microalbuminuria   . 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 Obesity, unspecified   . Plantar wart   . Sleep apnea   . Synovitis and tenosynovitis, unspecified   . Type II or unspecified type diabetes mellitus without mention of complication, uncontrolled   . Unspecified venous (peripheral) insufficiency       Objective/Physical Exam General: The patient is alert and oriented x3 in no acute distress.  Dermatology:  Wounds noted to the bilateral sub-second MPJ measuring 1.0 x 1.0 x 0.2 cm (LxWxD).   To the noted ulceration(s), there is no eschar. There is a moderate amount of slough, fibrin, and necrotic tissue noted. Granulation tissue and wound base is red. There is a minimal amount of serosanguineous drainage noted. There is no exposed bone muscle-tendon ligament or joint. There is no malodor. Periwound integrity is intact. Skin is warm, dry and supple bilateral lower extremities.  Vascular: Palpable pedal pulses bilaterally. No edema or erythema noted. Capillary refill within normal limits.  Neurological: Epicritic and protective threshold diminished bilaterally.   Musculoskeletal Exam: Range of motion within normal limits to all pedal and ankle joints bilateral. Muscle strength 5/5 in all groups  bilateral.   Assessment: #1 ulcerations of bilateral sub-second MPJs secondary to diabetes mellitus #2 diabetes mellitus w/ peripheral neuropathy   Plan of Care:  #1 Patient was evaluated. #2 medically necessary excisional debridement including subcutaneous tissue was performed using a tissue nipper and a chisel blade. Excisional debridement of all the necrotic nonviable tissue down to healthy bleeding viable tissue was performed with post-debridement measurements same as pre-. #3 the wound was cleansed and dry sterile dressing applied. #4 Continue using gentamicin cream daily with a dry sterile dressing.  #5 Continue wearing DM shoes.  #6 patient is to return to clinic in 3 weeks.   Edrick Kins, DPM [facility]  Dr. Edrick Kins, [address]                                        [place], [place] [address]                Office [phone]  Fax [phone]

## 2017-06-15 ENCOUNTER — Encounter: Payer: Self-pay | Admitting: Family Medicine

## 2017-06-15 ENCOUNTER — Ambulatory Visit: Payer: Medicare Other | Admitting: Family Medicine

## 2017-06-15 VITALS — BP 144/80 | HR 87 | Resp 16 | Ht 77.0 in | Wt 242.9 lb

## 2017-06-15 DIAGNOSIS — E1122 Type 2 diabetes mellitus with diabetic chronic kidney disease: Secondary | ICD-10-CM | POA: Diagnosis not present

## 2017-06-15 DIAGNOSIS — I10 Essential (primary) hypertension: Secondary | ICD-10-CM | POA: Diagnosis not present

## 2017-06-15 DIAGNOSIS — E785 Hyperlipidemia, unspecified: Secondary | ICD-10-CM | POA: Diagnosis not present

## 2017-06-15 DIAGNOSIS — N2581 Secondary hyperparathyroidism of renal origin: Secondary | ICD-10-CM

## 2017-06-15 DIAGNOSIS — E118 Type 2 diabetes mellitus with unspecified complications: Secondary | ICD-10-CM | POA: Diagnosis not present

## 2017-06-15 DIAGNOSIS — Z720 Tobacco use: Secondary | ICD-10-CM | POA: Diagnosis not present

## 2017-06-15 DIAGNOSIS — E1169 Type 2 diabetes mellitus with other specified complication: Secondary | ICD-10-CM

## 2017-06-15 DIAGNOSIS — R809 Proteinuria, unspecified: Secondary | ICD-10-CM | POA: Diagnosis not present

## 2017-06-15 DIAGNOSIS — E1129 Type 2 diabetes mellitus with other diabetic kidney complication: Secondary | ICD-10-CM

## 2017-06-15 DIAGNOSIS — N183 Chronic kidney disease, stage 3 unspecified: Secondary | ICD-10-CM

## 2017-06-15 DIAGNOSIS — J411 Mucopurulent chronic bronchitis: Secondary | ICD-10-CM

## 2017-06-15 DIAGNOSIS — F320 Major depressive disorder, single episode, mild: Secondary | ICD-10-CM

## 2017-06-15 DIAGNOSIS — I129 Hypertensive chronic kidney disease with stage 1 through stage 4 chronic kidney disease, or unspecified chronic kidney disease: Secondary | ICD-10-CM

## 2017-06-15 LAB — LIPID PANEL
Cholesterol: 86 mg/dL (ref <200)
HDL: 24 mg/dL — ABNORMAL LOW (ref >40)
LDL CHOLESTEROL (CALC): 45 mg/dL
Non-HDL Cholesterol (Calc): 62 mg/dL (calc) (ref <130)
TRIGLYCERIDES: 90 mg/dL (ref <150)
Total CHOL/HDL Ratio: 3.6 (calc) (ref <5.0)

## 2017-06-15 LAB — POCT GLYCOSYLATED HEMOGLOBIN (HGB A1C): Hemoglobin A1C: 6.6 % — AB (ref 4.0–5.6)

## 2017-06-15 MED ORDER — ATORVASTATIN CALCIUM 40 MG PO TABS: 40.0000 mg | ORAL_TABLET | Freq: Every day | ORAL | 1 refills | Status: DC

## 2017-06-15 MED ORDER — OLMESARTAN-AMLODIPINE-HCTZ 40-10-25 MG PO TABS: 1.0000 | ORAL_TABLET | Freq: Every day | ORAL | 1 refills | Status: DC

## 2017-06-15 NOTE — Progress Notes (Signed)
Name: Joseph Newman   MRN: [id number]    DOB: 26-Jul-1945   Date:06/15/2017       Progress Note  Subjective  Chief Complaint  Chief Complaint  Patient presents with  . Diabetes  . Hyperlipidemia  . Obesity    HPI  HTN: he has been compliant with his medications and denies side effects. He is Tribenzor and Coreg daily and bp is at goal, he denies dizziness, palpitation or chest pain. His bp yesterday at nephrologist was high, but he was anxious about possible dialysis. His bp today is 144 and we will continue to monitor for now  Hyperlipidemia:he is on Atorvastatin, denies myalgias. Due for labs today   COPD:  He is not very physically active and still smokes, but  down to less than one quarter   pack a day, but  not willing to quit at this time. He coughs in am, that is productive, he has exercise intolerance but denies wheezing. He does not want medication, discussed spirometry and inhalers.   DM: sees Endo, on Trulicity and since yesterday off Metformin because of drop in kidney function. hgbA1C is much better now 7.6% 08/2015 ,  8.0% 02/2016 , 7.5% and down to 6.5%  He denies polyphagia, polydipsia but he has polyuria every 2 hours . He denies blurred vision, but has cataracts and needs surgery . He has microalbuminuria and is taking Benicar He is also seeing Dr. Holley Raring. He has ED, CKI , and diabetic foot ulcer, seeing Dr. Amalia Hailey and having debridements every 3 weeks  Overweight: no longer obese he had lost weight   Depression Major:  No longer going to church, wants to stay at home, he states he does not have anything else to do but smoke,he still  engages with the neighbors. Sits most of the day outside in the garage. His phq 9 is elevated, but he does not want to take medications, states he feels fine   Patient Active Problem List   Diagnosis Date Noted  . Dyslipidemia associated with type 2 diabetes mellitus ([place]) 03/13/2016  . Diabetic ulcer of toe of right foot associated with  type 2 diabetes mellitus, limited to breakdown of skin ([place]) 03/07/2016  . Microscopic hematuria 05/30/2015  . 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 Dyslipidemia 08/31/2014  . Tobacco abuse 08/31/2014  . Diabetes mellitus with renal manifestation ([place]) 08/31/2014  . COPD (chronic obstructive pulmonary disease) ([place]) 08/31/2014  . Microalbuminuria 08/31/2014  . ED (erectile dysfunction) 08/31/2014  . Hypertension   . Dyspnea 10/09/2011    Past Surgical History:  Procedure Laterality Date  . foot surgery      Family History  Problem Relation Age of Onset  . Hypertension Mother   . Kidney disease Neg Hx   . Prostate cancer Neg Hx     Social History   Socioeconomic History  . Marital status: Married    Spouse name: Not on file  . Number of children: 2  . Years of education: Not on file  . Highest education level: Not on file  Occupational History  . Not on file  Social Needs  . Financial resource strain: Not on file  . Food insecurity:    Worry: Not on file    Inability: Not on file  . Transportation needs:    Medical: Not on file    Non-medical: Not on file  Tobacco Use  . Smoking status: Current Every Day Smoker    Packs/day: 0.25  Years: 30.00    Pack years: 7.50    Types: Cigarettes, Cigars    Last attempt to quit: 02/25/2003    Years since quitting: 14.3  . Smokeless tobacco: Never Used  . Tobacco comment: he is cutting down, used to smoke one pack day   Substance and Sexual Activity  . Alcohol use: Yes    Alcohol/week: 0.0 oz    Comment: occasional  . Drug use: No  . Sexual activity: Not Currently  Lifestyle  . Physical activity:    Days per week: Not on file    Minutes per session: Not on file  . Stress: Not on file  Relationships  . Social connections:    Talks on phone: Not on file    Gets together: Not on file    Attends religious service: Not on file    Active member of club or organization: Not  on file    Attends meetings of clubs or organizations: Not on file    Relationship status: Not on file  . Intimate partner violence:    Fear of current or ex partner: Not on file    Emotionally abused: Not on file    Physically abused: Not on file    Forced sexual activity: Not on file  Other Topics Concern  . Not on file  Social History Narrative  . Not on file     Current Outpatient Medications:  .  aspirin EC 81 MG tablet, Take 81 mg by mouth daily., Disp: , Rfl:  .  atorvastatin (LIPITOR) 40 MG tablet, Take 1 tablet (40 mg total) by mouth daily., Disp: 90 tablet, Rfl: 1 .  Blood Glucose Monitoring Suppl (ONE TOUCH ULTRA SYSTEM KIT) W/DEVICE KIT, 1 kit by Does not apply route once., Disp: , Rfl:  .  calcitRIOL (ROCALTROL) 0.25 MCG capsule, Take by mouth., Disp: , Rfl:  .  carvedilol (COREG) 6.25 MG tablet, , Disp: , Rfl:  .  Cholecalciferol (VITAMIN D) 2000 UNITS CAPS, Take by mouth., Disp: , Rfl:  .  Cyanocobalamin (VITAMIN B-12) 1000 MCG SUBL, Take 1 tablet by mouth daily., Disp: , Rfl:  .  Dulaglutide (TRULICITY) 1.5 MB/8.6LJ SOPN, INJECT 1.5 MG SUBCUTANEOUSLY EVERY 7 (SEVEN) DAYS., Disp: , Rfl:  .  Olmesartan-amLODIPine-HCTZ 40-10-25 MG TABS, Take 1 tablet by mouth daily., Disp: 90 tablet, Rfl: 1 .  sodium bicarbonate 650 MG tablet, TAKE TWO (2) TABLETS BY MOUTH TWICE A DAY, Disp: , Rfl: 12 .  TRULICITY 1.5 QG/9.2EF SOPN, , Disp: , Rfl:  .  KIONEX 15 GM/60ML suspension, TAKE 120 ML (30GM) BY MOUTH TWICE A DAY FOR 2 DAYS, Disp: , Rfl: 0  No Known Allergies   ROS  Constitutional: Negative for fever , positive for weight change.  Respiratory: positive  for cough and shortness of breath.   Cardiovascular: Negative for chest pain or palpitations.  Gastrointestinal: Negative for abdominal pain, no bowel changes.  Musculoskeletal: positive for gait problem - uses a cane -  joint swelling.   Skin: Negative for rash. no pruritis  Neurological: Negative for dizziness or headache.   No other specific complaints in a complete review of systems (except as listed in HPI above).  Objective  Vitals:   06/15/17 0835  BP: (!) 144/80  Pulse: 87  Resp: 16  SpO2: 97%  Weight: 242 lb 14.4 oz (110.2 kg)  Height: 6' 5"  (1.956 m)    Body mass index is 28.8 kg/m.  Physical Exam  Constitutional: Patient appears well-developed  No distress.  HEENT: head atraumatic, normocephalic, pupils equal and reactive to light,  neck supple, throat within normal limits Cardiovascular: Normal rate, regular rhythm and normal heart sounds.  No murmur heard. Trace BLE edema. Pulmonary/Chest: Effort normal and breath sounds normal. No respiratory distress. Abdominal: Soft.  There is no tenderness. Psychiatric: Patient has a normal mood and affect. behavior is normal. Judgment and thought content normal.  Recent Results (from the past 2160 hour(s))  POCT HgB A1C     Status: Abnormal   Collection Time: 06/15/17  9:00 AM  Result Value Ref Range   Hemoglobin A1C 6.6 (A) 4.0 - 5.6 %   HbA1c, POC (prediabetic range)  5.7 - 6.4 %   HbA1c, POC (controlled diabetic range)  0.0 - 7.0 %     PHQ2/9: Depression screen [facility] 2/9 06/15/2017 03/13/2016 11/10/2015 05/10/2015 11/04/2014  Decreased Interest 3 0 0 0 0  Down, Depressed, Hopeless 0 0 0 0 0  PHQ - 2 Score 3 0 0 0 0  Altered sleeping 3 - - - -  Tired, decreased energy 3 - - - -  Change in appetite 0 - - - -  Feeling bad or failure about yourself  2 - - - -  Trouble concentrating 0 - - - -  Moving slowly or fidgety/restless 0 - - - -  Suicidal thoughts 0 - - - -  PHQ-9 Score 11 - - - -  Difficult doing work/chores Not difficult at all - - - -     Fall Risk: Fall Risk  06/15/2017 06/15/2017 03/13/2016 11/10/2015 05/10/2015  Falls in the past year? No No Yes No No  Number falls in past yr: - - 1 - -  Injury with Fall? - - No - -     Functional Status Survey: Is the patient deaf or have difficulty hearing?: No Does the patient have  difficulty seeing, even when wearing glasses/contacts?: No Does the patient have difficulty concentrating, remembering, or making decisions?: No Does the patient have difficulty walking or climbing stairs?: Yes Does the patient have difficulty dressing or bathing?: No Does the patient have difficulty doing errands alone such as visiting a doctor's office or shopping?: Yes   Assessment & Plan   1. Type 2 diabetes mellitus with microalbuminuria, unspecified whether long term insulin use (HCC)  - POCT HgB A1C  2. Dyslipidemia associated with type 2 diabetes mellitus (HCC)  - atorvastatin (LIPITOR) 40 MG tablet; Take 1 tablet (40 mg total) by mouth daily.  Dispense: 90 tablet; Refill: 1  3. Mucopurulent chronic bronchitis ([place])  Still smoking and not ready to quit  4. Dyslipidemia   5. Hypertension associated with stage 3 chronic kidney disease due to type 2 diabetes mellitus (HCC)  - Olmesartan-amLODIPine-HCTZ 40-10-25 MG TABS; Take 1 tablet by mouth daily.  Dispense: 90 tablet; Refill: 1  6. Essential hypertension  - Olmesartan-amLODIPine-HCTZ 40-10-25 MG TABS; Take 1 tablet by mouth daily.  Dispense: 90 tablet; Refill: 1  7. Type 2 diabetes mellitus with complication, without long-term current use of insulin (HCC)  CKI, dyslipidemia, neuropathy   8. Stage 3 chronic kidney disease [facility]  Seeing nephrologist, had labs yesterday and is getting evaluated for peritoneal dialsis.   9. Tobacco abuse   10. Mild major depression ([place])  He refuses medication at this time  11. Hyperparathyroidism, secondary renal ([place])  Labs monitored by nephrologist

## 2017-06-15 NOTE — Patient Instructions (Signed)
Steps to Quit Smoking Smoking tobacco can be bad for your health. It can also affect almost every organ in your body. Smoking puts you and people around you at risk for many serious long-lasting (chronic) diseases. Quitting smoking is hard, but it is one of the best things that you can do for your health. It is never too late to quit. What are the benefits of quitting smoking? When you quit smoking, you lower your risk for getting serious diseases and conditions. They can include:  Lung cancer or lung disease.  Heart disease.  Stroke.  Heart attack.  Not being able to have children (infertility).  Weak bones (osteoporosis) and broken bones (fractures).  If you have coughing, wheezing, and shortness of breath, those symptoms may get better when you quit. You may also get sick less often. If you are pregnant, quitting smoking can help to lower your chances of having a baby of low birth weight. What can I do to help me quit smoking? Talk with your doctor about what can help you quit smoking. Some things you can do (strategies) include:  Quitting smoking totally, instead of slowly cutting back how much you smoke over a period of time.  Going to in-person counseling. You are more likely to quit if you go to many counseling sessions.  Using resources and support systems, such as: ? Online chats with a counselor. ? Phone quitlines. ? Printed self-help materials. ? Support groups or group counseling. ? Text messaging programs. ? Mobile phone apps or applications.  Taking medicines. Some of these medicines may have nicotine in them. If you are pregnant or breastfeeding, do not take any medicines to quit smoking unless your doctor says it is okay. Talk with your doctor about counseling or other things that can help you.  Talk with your doctor about using more than one strategy at the same time, such as taking medicines while you are also going to in-person counseling. This can help make  quitting easier. What things can I do to make it easier to quit? Quitting smoking might feel very hard at first, but there is a lot that you can do to make it easier. Take these steps:  Talk to your family and friends. Ask them to support and encourage you.  Call phone quitlines, reach out to support groups, or work with a counselor.  Ask people who smoke to not smoke around you.  Avoid places that make you want (trigger) to smoke, such as: ? Bars. ? Parties. ? Smoke-break areas at work.  Spend time with people who do not smoke.  Lower the stress in your life. Stress can make you want to smoke. Try these things to help your stress: ? Getting regular exercise. ? Deep-breathing exercises. ? Yoga. ? Meditating. ? Doing a body scan. To do this, close your eyes, focus on one area of your body at a time from head to toe, and notice which parts of your body are tense. Try to relax the muscles in those areas.  Download or buy apps on your mobile phone or tablet that can help you stick to your quit plan. There are many free apps, such as QuitGuide from the CDC (Centers for Disease Control and Prevention). You can find more support from smokefree.gov and other websites.  This information is not intended to replace advice given to you by your health care provider. Make sure you discuss any questions you have with your health care provider. Document Released: 11/05/2008 Document   Revised: 09/07/2015 Document Reviewed: 05/26/2014 Elsevier Interactive Patient Education  2018 Elsevier Inc.  

## 2017-06-19 ENCOUNTER — Telehealth: Payer: Self-pay

## 2017-06-19 NOTE — Telephone Encounter (Signed)
Pt called and given lab results. Pt verbalized understanding.

## 2017-06-19 NOTE — Telephone Encounter (Signed)
Patient called.  Unable to reach patient. If patient calls back please inform her of her most recent lab results.

## 2017-06-19 NOTE — Telephone Encounter (Signed)
-----   Message from Steele Sizer, MD sent at 06/18/2017  9:52 AM EDT ----- Lipid panel showed, triglycerides is back to normal, HDL is low ( it can be improved by exercise, eating tree nuts and fish)

## 2017-06-29 NOTE — Telephone Encounter (Signed)
FYI

## 2017-07-03 ENCOUNTER — Encounter: Payer: Self-pay | Admitting: Podiatry

## 2017-07-03 ENCOUNTER — Ambulatory Visit: Payer: Medicare Other | Admitting: Podiatry

## 2017-07-03 DIAGNOSIS — E0842 Diabetes mellitus due to underlying condition with diabetic polyneuropathy: Secondary | ICD-10-CM | POA: Diagnosis not present

## 2017-07-03 DIAGNOSIS — L97512 Non-pressure chronic ulcer of other part of right foot with fat layer exposed: Secondary | ICD-10-CM | POA: Diagnosis not present

## 2017-07-03 DIAGNOSIS — I70235 Atherosclerosis of native arteries of right leg with ulceration of other part of foot: Secondary | ICD-10-CM | POA: Diagnosis not present

## 2017-07-03 DIAGNOSIS — L84 Corns and callosities: Secondary | ICD-10-CM

## 2017-07-03 MED ORDER — GENTAMICIN SULFATE 0.1 % EX CREA: 1.0000 "application " | TOPICAL_CREAM | Freq: Three times a day (TID) | CUTANEOUS | 1 refills | Status: DC

## 2017-07-09 NOTE — Progress Notes (Signed)
   Subjective:  72 year old male presenting today for follow up evaluation of ulcerations noted to the bilateral feet. He states the wounds have not changed since his last visit. He has been using gentamicin cream with a bandage for treatment. Patient is here for further evaluation and treatment.   Past Medical History:  Diagnosis Date  . Arthritis   . Chronic airway obstruction, not elsewhere classified   . 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 Holley Raring, nephrologist  . Depressive disorder, not elsewhere classified   . Heart murmur   . Hyperlipidemia   . Hypertension   . Hypertrophy of prostate without urinary obstruction and other lower urinary tract symptoms (LUTS)   . Impotence of organic origin   . Microalbuminuria   . 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 Obesity, unspecified   . Plantar wart   . Sleep apnea   . Synovitis and tenosynovitis, unspecified   . Type II or unspecified type diabetes mellitus without mention of complication, uncontrolled   . Unspecified venous (peripheral) insufficiency      Objective/Physical Exam General: The patient is alert and oriented x3 in no acute distress.  Dermatology:  Wound #1 noted to the right foot measuring 1.0 x 1.0 x 0.5 cm (LxWxD).   To the noted ulceration(s), there is no eschar. There is a moderate amount of slough, fibrin, and necrotic tissue noted. Granulation tissue and wound base is red. There is a minimal amount of serosanguineous drainage noted. There is no exposed bone muscle-tendon ligament or joint. There is no malodor. Periwound integrity is intact. Hyperkeratotic lesion present on the left foot. Pain on palpation with a central nucleated core noted. Skin is warm, dry and supple bilateral lower extremities.  Vascular: Palpable pedal pulses bilaterally. No edema or erythema noted. Capillary refill within normal limits.  Neurological: Epicritic and protective threshold  diminished bilaterally.   Musculoskeletal Exam: Range of motion within normal limits to all pedal and ankle joints bilateral. Muscle strength 5/5 in all groups bilateral.   Assessment: #1 ulceration noted to the right foot secondary to diabetes mellitus #2 pre-ulcerative callus lesion noted to the left foot #3 diabetes mellitus w/ peripheral neuropathy   Plan of Care:  #1 Patient was evaluated. #2 medically necessary excisional debridement including subcutaneous tissue was performed using a tissue nipper and a chisel blade. Excisional debridement of all the necrotic nonviable tissue down to healthy bleeding viable tissue was performed with post-debridement measurements same as pre-. #3 the wound was cleansed and dry sterile dressing applied. #4 Excisional debridement of keratoic lesion using a chisel blade was performed without incident. Light dressing applied.  #5 Refill prescription for gentamicin cream to be used daily with a bandage provided to patient.  #6 Return to clinic in 3 weeks.    Edrick Kins, DPM [facility]  Dr. Edrick Kins, [address]                                        [place], [place] [address]                Office [phone]  Fax [phone]

## 2017-07-24 ENCOUNTER — Encounter: Payer: Self-pay | Admitting: Podiatry

## 2017-07-24 ENCOUNTER — Ambulatory Visit: Payer: Medicare Other | Admitting: Podiatry

## 2017-07-24 DIAGNOSIS — L97512 Non-pressure chronic ulcer of other part of right foot with fat layer exposed: Secondary | ICD-10-CM | POA: Diagnosis not present

## 2017-07-24 DIAGNOSIS — L97522 Non-pressure chronic ulcer of other part of left foot with fat layer exposed: Secondary | ICD-10-CM | POA: Diagnosis not present

## 2017-07-24 DIAGNOSIS — I70235 Atherosclerosis of native arteries of right leg with ulceration of other part of foot: Secondary | ICD-10-CM

## 2017-07-24 DIAGNOSIS — E0842 Diabetes mellitus due to underlying condition with diabetic polyneuropathy: Secondary | ICD-10-CM

## 2017-07-24 NOTE — Progress Notes (Signed)
   Subjective:  72 year old male presenting today for follow up evaluation of an ulcerations to the bilateral feet. He states the wounds have not changed much since his previous visit. There are no modifying factors noted. He has been wearing his DM shoes as directed. Patient is here for further evaluation and treatment.   Past Medical History:  Diagnosis Date  . Arthritis   . Chronic airway obstruction, not elsewhere classified   . 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 Holley Raring, nephrologist  . Depressive disorder, not elsewhere classified   . Heart murmur   . Hyperlipidemia   . Hypertension   . Hypertrophy of prostate without urinary obstruction and other lower urinary tract symptoms (LUTS)   . Impotence of organic origin   . Microalbuminuria   . 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 Obesity, unspecified   . Plantar wart   . Sleep apnea   . Synovitis and tenosynovitis, unspecified   . Type II or unspecified type diabetes mellitus without mention of complication, uncontrolled   . Unspecified venous (peripheral) insufficiency      Objective/Physical Exam General: The patient is alert and oriented x3 in no acute distress.  Dermatology:  Wound #1 noted to the sub-second MPJ of bilateral feet measuring 1.0 x 1.0 x 0.3 cm (LxWxD).   To the noted ulceration(s), there is no eschar. There is a moderate amount of slough, fibrin, and necrotic tissue noted. Granulation tissue and wound base is red. There is a minimal amount of serosanguineous drainage noted. There is no exposed bone muscle-tendon ligament or joint. There is no malodor. Periwound integrity is intact.  Vascular: Palpable pedal pulses bilaterally. No edema or erythema noted. Capillary refill within normal limits.  Neurological: Epicritic and protective threshold diminished bilaterally.   Musculoskeletal Exam: Range of motion within normal limits to all pedal and ankle joints  bilateral. Muscle strength 5/5 in all groups bilateral.   Assessment: #1 ulceration noted to the sub-second MPJ of bilateral feet    Plan of Care:  #1 Patient was evaluated. #2 medically necessary excisional debridement including subcutaneous tissue was performed using a tissue nipper and a chisel blade. Excisional debridement of all the necrotic nonviable tissue down to healthy bleeding viable tissue was performed with post-debridement measurements same as pre-. #3 the wound was cleansed and dry sterile dressing applied. #4 Continue wearing DM shoes.  #5 Return to clinic in 3 weeks.     Edrick Kins, DPM [facility]  Dr. Edrick Kins, [place]                                        [place], [place] [address]                Office [phone]  Fax [phone]

## 2017-07-26 DIAGNOSIS — N185 Chronic kidney disease, stage 5: Secondary | ICD-10-CM | POA: Insufficient documentation

## 2017-08-15 ENCOUNTER — Ambulatory Visit
Admission: RE | Admit: 2017-08-15 | Discharge: 2017-08-15 | Disposition: A | Discharge status: Home / Self Care | Payer: Medicare Other | Source: Ambulatory Visit | Attending: Nephrology | Admitting: Nephrology

## 2017-08-15 ENCOUNTER — Other Ambulatory Visit: Payer: Self-pay | Admitting: Nephrology

## 2017-08-15 DIAGNOSIS — T85611A Breakdown (mechanical) of intraperitoneal dialysis catheter, initial encounter: Secondary | ICD-10-CM

## 2017-08-15 DIAGNOSIS — Y838 Other surgical procedures as the cause of abnormal reaction of the patient, or of later complication, without mention of misadventure at the time of the procedure: Secondary | ICD-10-CM | POA: Diagnosis not present

## 2017-08-17 ENCOUNTER — Ambulatory Visit: Payer: Medicare Other | Admitting: Podiatry

## 2017-08-21 ENCOUNTER — Encounter: Payer: Self-pay | Admitting: Podiatry

## 2017-08-21 ENCOUNTER — Ambulatory Visit: Payer: Medicare Other | Admitting: Podiatry

## 2017-08-21 DIAGNOSIS — L97522 Non-pressure chronic ulcer of other part of left foot with fat layer exposed: Secondary | ICD-10-CM

## 2017-08-21 DIAGNOSIS — E0842 Diabetes mellitus due to underlying condition with diabetic polyneuropathy: Secondary | ICD-10-CM | POA: Diagnosis not present

## 2017-08-21 DIAGNOSIS — L97512 Non-pressure chronic ulcer of other part of right foot with fat layer exposed: Secondary | ICD-10-CM

## 2017-08-21 DIAGNOSIS — I70235 Atherosclerosis of native arteries of right leg with ulceration of other part of foot: Secondary | ICD-10-CM

## 2017-08-26 NOTE — Progress Notes (Signed)
   Subjective:  72 year old male presenting today for follow up evaluation of an ulcerations to the bilateral feet. He states he is doing well overall. He denies any pain or new concerns at this time. Patient is here for further evaluation and treatment.   Past Medical History:  Diagnosis Date  . Arthritis   . Chronic airway obstruction, not elsewhere classified   . 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 Holley Raring, nephrologist  . Depressive disorder, not elsewhere classified   . Heart murmur   . Hyperlipidemia   . Hypertension   . Hypertrophy of prostate without urinary obstruction and other lower urinary tract symptoms (LUTS)   . Impotence of organic origin   . Microalbuminuria   . 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 Obesity, unspecified   . Plantar wart   . Sleep apnea   . Synovitis and tenosynovitis, unspecified   . Type II or unspecified type diabetes mellitus without mention of complication, uncontrolled   . Unspecified venous (peripheral) insufficiency      Objective/Physical Exam General: The patient is alert and oriented x3 in no acute distress.  Dermatology:  Wound #1 noted to the bilateral forefeet both measuring 1.5 x 1.2 x 0.2 cm (LxWxD).   To the noted ulceration(s), there is no eschar. There is a moderate amount of slough, fibrin, and necrotic tissue noted. Granulation tissue and wound base is red. There is a minimal amount of serosanguineous drainage noted. There is no exposed bone muscle-tendon ligament or joint. There is no malodor. Periwound integrity is intact.  Vascular: Palpable pedal pulses bilaterally. No edema or erythema noted. Capillary refill within normal limits.  Neurological: Epicritic and protective threshold diminished bilaterally.   Musculoskeletal Exam: Range of motion within normal limits to all pedal and ankle joints bilateral. Muscle strength 5/5 in all groups bilateral.   Assessment: #1  ulceration noted to the bilateral forefeet secondary to DM   Plan of Care:  #1 Patient was evaluated. #2 medically necessary excisional debridement including subcutaneous tissue was performed using a tissue nipper and a chisel blade. Excisional debridement of all the necrotic nonviable tissue down to healthy bleeding viable tissue was performed with post-debridement measurements same as pre-. #3 the wound was cleansed and dry sterile dressing applied. #4 Recommended Betadine daily with a dry sterile dressing.   #5 Return to clinic in 3 weeks.     Edrick Kins, DPM [facility]  Dr. Edrick Kins, [address]                                        [place],  [address]                Office [phone]  Fax [phone]

## 2017-09-06 DIAGNOSIS — E1169 Type 2 diabetes mellitus with other specified complication: Secondary | ICD-10-CM | POA: Insufficient documentation

## 2017-09-06 DIAGNOSIS — E785 Hyperlipidemia, unspecified: Secondary | ICD-10-CM

## 2017-09-11 ENCOUNTER — Encounter: Payer: Self-pay | Admitting: Podiatry

## 2017-09-11 ENCOUNTER — Ambulatory Visit: Payer: Medicare Other | Admitting: Podiatry

## 2017-09-11 DIAGNOSIS — L97512 Non-pressure chronic ulcer of other part of right foot with fat layer exposed: Secondary | ICD-10-CM | POA: Diagnosis not present

## 2017-09-11 DIAGNOSIS — E0842 Diabetes mellitus due to underlying condition with diabetic polyneuropathy: Secondary | ICD-10-CM

## 2017-09-11 DIAGNOSIS — B351 Tinea unguium: Secondary | ICD-10-CM

## 2017-09-11 DIAGNOSIS — L989 Disorder of the skin and subcutaneous tissue, unspecified: Secondary | ICD-10-CM

## 2017-09-11 DIAGNOSIS — M79676 Pain in unspecified toe(s): Secondary | ICD-10-CM | POA: Diagnosis not present

## 2017-09-11 DIAGNOSIS — I70235 Atherosclerosis of native arteries of right leg with ulceration of other part of foot: Secondary | ICD-10-CM | POA: Diagnosis not present

## 2017-09-13 NOTE — Progress Notes (Signed)
Subjective:  72 year old male presenting today for follow up evaluation of an ulcerations to the bilateral feet. He reports some bloody drainage from the wound on the right foot. Walking causes this drainage. He states the left foot wound has improved. Patient is here for further evaluation and treatment.   Past Medical History:  Diagnosis Date  . Arthritis   . Chronic airway obstruction, not elsewhere classified   . 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 Holley Raring, nephrologist  . Depressive disorder, not elsewhere classified   . Heart murmur   . Hyperlipidemia   . Hypertension   . Hypertrophy of prostate without urinary obstruction and other lower urinary tract symptoms (LUTS)   . Impotence of organic origin   . Microalbuminuria   . 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 Obesity, unspecified   . Plantar wart   . Sleep apnea   . Synovitis and tenosynovitis, unspecified   . Type II or unspecified type diabetes mellitus without mention of complication, uncontrolled   . Unspecified venous (peripheral) insufficiency      Objective/Physical Exam General: The patient is alert and oriented x3 in no acute distress.  Dermatology:  Wound #1 noted to the right forefoot measuring 1.0 x 1.0 x 0.2 cm (LxWxD).   To the noted ulceration(s), there is no eschar. There is a moderate amount of slough, fibrin, and necrotic tissue noted. Granulation tissue and wound base is red. There is a minimal amount of serosanguineous drainage noted. There is no exposed bone muscle-tendon ligament or joint. There is no malodor. Periwound integrity is intact. Hyperkeratotic lesion present on the left forefoot. Pain on palpation with a central nucleated core noted. Nails are tender, long, thickened and dystrophic with subungual debris, consistent with onychomycosis, 1-5 bilateral. No signs of infection noted.  Vascular: Palpable pedal pulses bilaterally. No edema or  erythema noted. Capillary refill within normal limits.  Neurological: Epicritic and protective threshold diminished bilaterally.   Musculoskeletal Exam: Range of motion within normal limits to all pedal and ankle joints bilateral. Muscle strength 5/5 in all groups bilateral.   Assessment: #1 ulceration of the right forefoot secondary to DM #2 Pre-ulcerative callus lesion noted to the left forefoot #3 Onychodystrophic nails 1-5 bilateral with hyperkeratosis of nails.  #4 Onychomycosis of nail due to dermatophyte bilateral   Plan of Care:  #1 Patient was evaluated. #2 medically necessary excisional debridement including subcutaneous tissue was performed using a tissue nipper and a chisel blade. Excisional debridement of all the necrotic nonviable tissue down to healthy bleeding viable tissue was performed with post-debridement measurements same as pre-. #3 the wound was cleansed and dry sterile dressing applied. #4 Mechanical debridement of nails 1-5 bilaterally performed using a nail nipper. Filed with dremel without incident.  #5 Excisional debridement of keratoic lesion using a chisel blade was performed without incident. Light dressing applied.  #6 Return to clinic in 3 weeks.     Edrick Kins, DPM [facility]  Dr. Edrick Kins, [address]                                        [place], [place] [address]                Office [phone]  Fax [phone]

## 2017-09-25 ENCOUNTER — Observation Stay (HOSPITAL_BASED_OUTPATIENT_CLINIC_OR_DEPARTMENT_OTHER)
Admit: 2017-09-25 | Discharge: 2017-09-25 | Disposition: A | Discharge status: Home / Self Care | Payer: Medicare Other | Attending: Internal Medicine | Admitting: Internal Medicine

## 2017-09-25 ENCOUNTER — Emergency Department: Payer: Medicare Other

## 2017-09-25 ENCOUNTER — Other Ambulatory Visit: Payer: Self-pay

## 2017-09-25 ENCOUNTER — Observation Stay: Payer: Medicare Other

## 2017-09-25 ENCOUNTER — Inpatient Hospital Stay
Admission: EM | Admit: 2017-09-25 | Discharge: 2017-09-29 | DRG: 871 | Disposition: A | Discharge status: Skilled Nursing Facility | Payer: Medicare Other | Attending: Internal Medicine | Admitting: Internal Medicine

## 2017-09-25 DIAGNOSIS — M25462 Effusion, left knee: Secondary | ICD-10-CM | POA: Diagnosis present

## 2017-09-25 DIAGNOSIS — Z79899 Other long term (current) drug therapy: Secondary | ICD-10-CM

## 2017-09-25 DIAGNOSIS — M25512 Pain in left shoulder: Secondary | ICD-10-CM | POA: Diagnosis present

## 2017-09-25 DIAGNOSIS — I132 Hypertensive heart and chronic kidney disease with heart failure and with stage 5 chronic kidney disease, or end stage renal disease: Secondary | ICD-10-CM | POA: Diagnosis present

## 2017-09-25 DIAGNOSIS — E1140 Type 2 diabetes mellitus with diabetic neuropathy, unspecified: Secondary | ICD-10-CM | POA: Diagnosis present

## 2017-09-25 DIAGNOSIS — Z7984 Long term (current) use of oral hypoglycemic drugs: Secondary | ICD-10-CM

## 2017-09-25 DIAGNOSIS — E11649 Type 2 diabetes mellitus with hypoglycemia without coma: Secondary | ICD-10-CM | POA: Diagnosis present

## 2017-09-25 DIAGNOSIS — N529 Male erectile dysfunction, unspecified: Secondary | ICD-10-CM | POA: Diagnosis present

## 2017-09-25 DIAGNOSIS — R52 Pain, unspecified: Secondary | ICD-10-CM

## 2017-09-25 DIAGNOSIS — Z7982 Long term (current) use of aspirin: Secondary | ICD-10-CM

## 2017-09-25 DIAGNOSIS — I5030 Unspecified diastolic (congestive) heart failure: Secondary | ICD-10-CM | POA: Diagnosis not present

## 2017-09-25 DIAGNOSIS — A419 Sepsis, unspecified organism: Secondary | ICD-10-CM | POA: Diagnosis not present

## 2017-09-25 DIAGNOSIS — F329 Major depressive disorder, single episode, unspecified: Secondary | ICD-10-CM | POA: Diagnosis present

## 2017-09-25 DIAGNOSIS — M1990 Unspecified osteoarthritis, unspecified site: Secondary | ICD-10-CM | POA: Diagnosis present

## 2017-09-25 DIAGNOSIS — F17210 Nicotine dependence, cigarettes, uncomplicated: Secondary | ICD-10-CM | POA: Diagnosis present

## 2017-09-25 DIAGNOSIS — Z8249 Family history of ischemic heart disease and other diseases of the circulatory system: Secondary | ICD-10-CM

## 2017-09-25 DIAGNOSIS — R29898 Other symptoms and signs involving the musculoskeletal system: Secondary | ICD-10-CM

## 2017-09-25 DIAGNOSIS — M25522 Pain in left elbow: Secondary | ICD-10-CM

## 2017-09-25 DIAGNOSIS — E1122 Type 2 diabetes mellitus with diabetic chronic kidney disease: Secondary | ICD-10-CM | POA: Diagnosis present

## 2017-09-25 DIAGNOSIS — J449 Chronic obstructive pulmonary disease, unspecified: Secondary | ICD-10-CM | POA: Diagnosis not present

## 2017-09-25 DIAGNOSIS — N186 End stage renal disease: Secondary | ICD-10-CM | POA: Diagnosis present

## 2017-09-25 DIAGNOSIS — M109 Gout, unspecified: Secondary | ICD-10-CM | POA: Diagnosis present

## 2017-09-25 DIAGNOSIS — D72829 Elevated white blood cell count, unspecified: Secondary | ICD-10-CM

## 2017-09-25 DIAGNOSIS — R0902 Hypoxemia: Secondary | ICD-10-CM

## 2017-09-25 DIAGNOSIS — E785 Hyperlipidemia, unspecified: Secondary | ICD-10-CM | POA: Diagnosis present

## 2017-09-25 DIAGNOSIS — G4730 Sleep apnea, unspecified: Secondary | ICD-10-CM | POA: Diagnosis present

## 2017-09-25 DIAGNOSIS — M25562 Pain in left knee: Secondary | ICD-10-CM | POA: Diagnosis present

## 2017-09-25 LAB — APTT: aPTT: 36 seconds (ref 24–36)

## 2017-09-25 LAB — COMPREHENSIVE METABOLIC PANEL
ALBUMIN: 2.9 g/dL — AB (ref 3.5–5.0)
ALK PHOS: 74 U/L (ref 38–126)
ALT: 9 U/L (ref 0–44)
AST: 16 U/L (ref 15–41)
Anion gap: 13 (ref 5–15)
BUN: 54 mg/dL — AB (ref 8–23)
CALCIUM: 8.5 mg/dL — AB (ref 8.9–10.3)
CO2: 27 mmol/L (ref 22–32)
Chloride: 96 mmol/L — ABNORMAL LOW (ref 98–111)
Creatinine, Ser: 5.78 mg/dL — ABNORMAL HIGH (ref 0.61–1.24)
GFR calc Af Amer: 10 mL/min — ABNORMAL LOW (ref >60)
GFR calc non Af Amer: 9 mL/min — ABNORMAL LOW (ref >60)
GLUCOSE: 214 mg/dL — AB (ref 70–99)
POTASSIUM: 3.7 mmol/L (ref 3.5–5.1)
Sodium: 136 mmol/L (ref 135–145)
TOTAL PROTEIN: 7 g/dL (ref 6.5–8.1)
Total Bilirubin: 0.6 mg/dL (ref 0.3–1.2)

## 2017-09-25 LAB — CBC
HCT: 31.3 % — ABNORMAL LOW (ref 40.0–52.0)
HEMOGLOBIN: 10.6 g/dL — AB (ref 13.0–18.0)
MCH: 29 pg (ref 26.0–34.0)
MCHC: 33.8 g/dL (ref 32.0–36.0)
MCV: 85.9 fL (ref 80.0–100.0)
Platelets: 226 10*3/uL (ref 150–440)
RBC: 3.64 MIL/uL — ABNORMAL LOW (ref 4.40–5.90)
RDW: 15.2 % — ABNORMAL HIGH (ref 11.5–14.5)
WBC: 15.8 10*3/uL — ABNORMAL HIGH (ref 3.8–10.6)

## 2017-09-25 LAB — DIFFERENTIAL
BASOS ABS: 0.1 10*3/uL (ref 0–0.1)
Basophils Relative: 0 %
Eosinophils Absolute: 0 10*3/uL (ref 0–0.7)
Eosinophils Relative: 0 %
LYMPHS ABS: 1.4 10*3/uL (ref 1.0–3.6)
LYMPHS PCT: 9 %
Monocytes Absolute: 1.5 10*3/uL — ABNORMAL HIGH (ref 0.2–1.0)
Monocytes Relative: 10 %
NEUTROS PCT: 81 %
Neutro Abs: 12.8 10*3/uL — ABNORMAL HIGH (ref 1.4–6.5)

## 2017-09-25 LAB — GLUCOSE, CAPILLARY
GLUCOSE-CAPILLARY: 183 mg/dL — AB (ref 70–99)
Glucose-Capillary: 179 mg/dL — ABNORMAL HIGH (ref 70–99)

## 2017-09-25 LAB — LACTIC ACID, PLASMA: LACTIC ACID, VENOUS: 2.1 mmol/L — AB (ref 0.5–1.9)

## 2017-09-25 LAB — PROTIME-INR
INR: 1.12
Prothrombin Time: 14.3 seconds (ref 11.4–15.2)

## 2017-09-25 LAB — URIC ACID: Uric Acid, Serum: 6.4 mg/dL (ref 3.7–8.6)

## 2017-09-25 LAB — CK: CK TOTAL: 49 U/L (ref 49–397)

## 2017-09-25 LAB — TROPONIN I: Troponin I: 0.03 ng/mL (ref <0.03)

## 2017-09-25 LAB — ETHANOL

## 2017-09-25 MED ORDER — OXYCODONE-ACETAMINOPHEN 5-325 MG PO TABS
1.0000 | ORAL_TABLET | ORAL | Status: DC | PRN
  Administered 2017-09-25 – 2017-09-29 (×7): 1 via ORAL
  Filled 2017-09-25 (×7): qty 1

## 2017-09-25 MED ORDER — SODIUM BICARBONATE 650 MG PO TABS
650.0000 mg | ORAL_TABLET | Freq: Two times a day (BID) | ORAL | Status: DC
  Administered 2017-09-25 – 2017-09-29 (×8): 650 mg via ORAL
  Filled 2017-09-25 (×10): qty 1

## 2017-09-25 MED ORDER — CALCITRIOL 0.25 MCG PO CAPS
0.2500 ug | ORAL_CAPSULE | Freq: Every day | ORAL | Status: DC
  Administered 2017-09-25 – 2017-09-29 (×5): 0.25 ug via ORAL
  Filled 2017-09-25 (×6): qty 1

## 2017-09-25 MED ORDER — ASPIRIN 81 MG PO CHEW
81.0000 mg | CHEWABLE_TABLET | Freq: Every day | ORAL | Status: DC
  Administered 2017-09-25 – 2017-09-29 (×5): 81 mg via ORAL
  Filled 2017-09-25 (×6): qty 1

## 2017-09-25 MED ORDER — ACETAMINOPHEN 160 MG/5ML PO SOLN
650.0000 mg | ORAL | Status: DC | PRN
  Filled 2017-09-25: qty 20.3

## 2017-09-25 MED ORDER — HEPARIN SODIUM (PORCINE) 5000 UNIT/ML IJ SOLN
5000.0000 [IU] | Freq: Three times a day (TID) | INTRAMUSCULAR | Status: DC
  Administered 2017-09-25 – 2017-09-29 (×9): 5000 [IU] via SUBCUTANEOUS
  Filled 2017-09-25 (×9): qty 1

## 2017-09-25 MED ORDER — CITALOPRAM HYDROBROMIDE 20 MG PO TABS
10.0000 mg | ORAL_TABLET | Freq: Every day | ORAL | Status: DC
  Administered 2017-09-25 – 2017-09-29 (×5): 10 mg via ORAL
  Filled 2017-09-25 (×5): qty 1

## 2017-09-25 MED ORDER — METFORMIN HCL 500 MG PO TABS: 1000.0000 mg | ORAL_TABLET | Freq: Two times a day (BID) | ORAL | Status: DC

## 2017-09-25 MED ORDER — INSULIN ASPART 100 UNIT/ML ~~LOC~~ SOLN
0.0000 [IU] | Freq: Three times a day (TID) | SUBCUTANEOUS | Status: DC
  Administered 2017-09-26: 2 [IU] via SUBCUTANEOUS
  Administered 2017-09-26: 3 [IU] via SUBCUTANEOUS
  Administered 2017-09-26: 1 [IU] via SUBCUTANEOUS
  Administered 2017-09-27 (×2): 3 [IU] via SUBCUTANEOUS
  Administered 2017-09-27: 17:00:00 2 [IU] via SUBCUTANEOUS
  Administered 2017-09-28: 5 [IU] via SUBCUTANEOUS
  Administered 2017-09-28: 09:00:00 7 [IU] via SUBCUTANEOUS
  Administered 2017-09-28: 3 [IU] via SUBCUTANEOUS
  Administered 2017-09-29: 9 [IU] via SUBCUTANEOUS
  Filled 2017-09-25 (×10): qty 1

## 2017-09-25 MED ORDER — OXYCODONE-ACETAMINOPHEN 5-325 MG PO TABS
1.0000 | ORAL_TABLET | Freq: Once | ORAL | Status: AC
  Administered 2017-09-25: 1 via ORAL
  Filled 2017-09-25: qty 1

## 2017-09-25 MED ORDER — ACETAMINOPHEN 650 MG RE SUPP: 650.0000 mg | RECTAL | Status: DC | PRN

## 2017-09-25 MED ORDER — ATORVASTATIN CALCIUM 20 MG PO TABS
40.0000 mg | ORAL_TABLET | Freq: Every day | ORAL | Status: DC
  Administered 2017-09-25 – 2017-09-29 (×5): 40 mg via ORAL
  Filled 2017-09-25 (×5): qty 2

## 2017-09-25 MED ORDER — STROKE: EARLY STAGES OF RECOVERY BOOK
Freq: Once | Status: AC
  Administered 2017-09-25: 19:00:00

## 2017-09-25 MED ORDER — INSULIN ASPART 100 UNIT/ML ~~LOC~~ SOLN
0.0000 [IU] | Freq: Every day | SUBCUTANEOUS | Status: DC
  Administered 2017-09-26: 22:00:00 2 [IU] via SUBCUTANEOUS
  Administered 2017-09-28: 3 [IU] via SUBCUTANEOUS
  Filled 2017-09-25 (×2): qty 1

## 2017-09-25 MED ORDER — IRBESARTAN 150 MG PO TABS
300.0000 mg | ORAL_TABLET | Freq: Every day | ORAL | Status: DC
  Administered 2017-09-25 – 2017-09-29 (×5): 300 mg via ORAL
  Filled 2017-09-25 (×5): qty 2

## 2017-09-25 MED ORDER — AMLODIPINE BESYLATE 10 MG PO TABS
10.0000 mg | ORAL_TABLET | Freq: Every day | ORAL | Status: DC
  Administered 2017-09-25 – 2017-09-29 (×5): 10 mg via ORAL
  Filled 2017-09-25 (×5): qty 1

## 2017-09-25 MED ORDER — ASPIRIN 81 MG PO CHEW
324.0000 mg | CHEWABLE_TABLET | Freq: Once | ORAL | Status: AC
  Administered 2017-09-25: 324 mg via ORAL
  Filled 2017-09-25: qty 4

## 2017-09-25 MED ORDER — VITAMIN B-12 1000 MCG PO TABS
1000.0000 ug | ORAL_TABLET | Freq: Every day | ORAL | Status: DC
  Administered 2017-09-25 – 2017-09-29 (×5): 1000 ug via ORAL
  Filled 2017-09-25 (×5): qty 1

## 2017-09-25 MED ORDER — OLMESARTAN-AMLODIPINE-HCTZ 40-10-25 MG PO TABS: 1.0000 | ORAL_TABLET | Freq: Every day | ORAL | Status: DC

## 2017-09-25 MED ORDER — CARVEDILOL 3.125 MG PO TABS
6.2500 mg | ORAL_TABLET | Freq: Two times a day (BID) | ORAL | Status: DC
  Administered 2017-09-25 – 2017-09-29 (×6): 6.25 mg via ORAL
  Filled 2017-09-25: qty 2
  Filled 2017-09-25 (×4): qty 1
  Filled 2017-09-25 (×2): qty 2
  Filled 2017-09-25: qty 1
  Filled 2017-09-25: qty 2
  Filled 2017-09-25: qty 1
  Filled 2017-09-25 (×2): qty 2
  Filled 2017-09-25: qty 1

## 2017-09-25 MED ORDER — HYDROCHLOROTHIAZIDE 25 MG PO TABS
25.0000 mg | ORAL_TABLET | Freq: Every day | ORAL | Status: DC
  Administered 2017-09-25 – 2017-09-28 (×4): 25 mg via ORAL
  Filled 2017-09-25 (×4): qty 1

## 2017-09-25 MED ORDER — ACETAMINOPHEN 325 MG PO TABS
650.0000 mg | ORAL_TABLET | ORAL | Status: DC | PRN
  Administered 2017-09-26: 650 mg via ORAL
  Filled 2017-09-25: qty 2

## 2017-09-25 MED ORDER — VITAMIN D 1000 UNITS PO TABS
1000.0000 [IU] | ORAL_TABLET | Freq: Every day | ORAL | Status: DC
  Administered 2017-09-25 – 2017-09-29 (×5): 1000 [IU] via ORAL
  Filled 2017-09-25 (×5): qty 1

## 2017-09-25 MED ORDER — SODIUM CHLORIDE 0.9 % IV SOLN
1.0000 g | INTRAVENOUS | Status: DC
  Administered 2017-09-25 – 2017-09-26 (×2): 1 g via INTRAVENOUS
  Filled 2017-09-25: qty 10
  Filled 2017-09-25 (×2): qty 1

## 2017-09-25 NOTE — Progress Notes (Signed)
CRITICAL VALUE ALERT  Critical Value:  Latic acid 2.1  Date & Time Notied:  09/25/2017 6:43  Provider Notified:Konidena  Orders Received/Actions taken: MD states will add antibiotic.

## 2017-09-25 NOTE — ED Notes (Signed)
Patient transported to CT 

## 2017-09-25 NOTE — Progress Notes (Signed)
OT Cancellation Note  Patient Details Name: Joseph Newman MRN: [id number] DOB: 09-23-1945   Cancelled Treatment:    Reason Eval/Treat Not Completed: Patient at procedure or test/ unavailable. Order received, chart reviewed. Pt not yet up to floor, currently undergoing testing. Will re-attempt OT evaluation next date as pt is available and medically appropriate.  Jeni Salles, MPH, MS, OTR/L ascom [phone] 09/25/17, 4:36 PM

## 2017-09-25 NOTE — Progress Notes (Addendum)
Nurse called me about lactic acid level of 2.1, with elevated white count up to 15.8 start antibiotic for indolent infection,not sure of origin yet.

## 2017-09-25 NOTE — H&P (Signed)
[facility] NAME: Dupree Givler    MR#:  [id number]  DATE OF BIRTH:  28-Apr-1945  DATE OF ADMISSION:  09/25/2017  PRIMARY CARE PHYSICIAN: Steele Sizer, MD   REQUESTING/REFERRING PHYSICIAN: Dr. Lenise Arena  CHIEF COMPLAINT: Left arm weakness   Chief Complaint  Patient presents with  . Weakness    HISTORY OF PRESENT ILLNESS:  Srikar Chiang  is a 72 y.o. male with a known history of hypertension, hyperlipidemia, diabetes mellitus type 2, ESRD on peritoneal dialysis comes in because of left arm weakness since yesterday night, slight weakness in the left leg also.  No slurred speech.  Patient denies any numbness or tingling.  He also complains of severe pain in the left shoulder, unable to lift left hand because of pain.  According to wife patient had trouble using the left hand and left labia since yesterday night but pain on the left also started yesterday.  No fever.  No injury, fall at home.  Usually patient is ambulatory and able to use both hands well.  Initial CT head did not show acute stroke.  No slurred speech noted.  Lab work is essentially normal except slightly elevated white count. PAST MEDICAL HISTORY:   Past Medical History:  Diagnosis Date  . Arthritis   . Chronic airway obstruction, not elsewhere classified   . 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 Holley Raring, nephrologist  . Depressive disorder, not elsewhere classified   . Heart murmur   . Hyperlipidemia   . Hypertension   . Hypertrophy of prostate without urinary obstruction and other lower urinary tract symptoms (LUTS)   . Impotence of organic origin   . Microalbuminuria   . 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 Obesity, unspecified   . Plantar wart   . Sleep apnea   . Synovitis and tenosynovitis, unspecified   . Type II or unspecified type diabetes mellitus without mention of complication, uncontrolled   .  Unspecified venous (peripheral) insufficiency     PAST SURGICAL HISTOIRY:   Past Surgical History:  Procedure Laterality Date  . foot surgery      SOCIAL HISTORY:   Social History   Tobacco Use  . Smoking status: Current Every Day Smoker    Packs/day: 0.25    Years: 30.00    Pack years: 7.50    Types: Cigarettes, Cigars    Last attempt to quit: 02/25/2003    Years since quitting: 14.5  . Smokeless tobacco: Never Used  . Tobacco comment: he is cutting down, used to smoke one pack day   Substance Use Topics  . Alcohol use: Yes    Alcohol/week: 0.0 standard drinks    Comment: occasional    FAMILY HISTORY:   Family History  Problem Relation Age of Onset  . Hypertension Mother   . Kidney disease Neg Hx   . Prostate cancer Neg Hx     DRUG ALLERGIES:  No Known Allergies  REVIEW OF SYSTEMS:  CONSTITUTIONAL: No fever, fatigue or weakness.  EYES: No blurred or double vision.  EARS, NOSE, AND THROAT: No tinnitus or ear pain.  RESPIRATORY: No cough, shortness of breath, wheezing or hemoptysis.  CARDIOVASCULAR: No chest pain, orthopnea, edema.  GASTROINTESTINAL: No nausea, vomiting, diarrhea or abdominal pain.  GENITOURINARY: No dysuria, hematuria.  ENDOCRINE: No polyuria, nocturia,  HEMATOLOGY: No anemia, easy bruising or bleeding SKIN: No rash or lesion. MUSCULOSKELETAL: No joint pain  or arthritis.  Left arm pain NEUROLOGIC: No tingling, numbness, weakness.  Slight weakness of left hand, left leg. PSYCHIATRY: No anxiety or depression.   MEDICATIONS AT HOME:   Prior to Admission medications   Medication Sig Start Date End Date Taking? Authorizing Provider  aspirin EC 81 MG tablet Take 81 mg by mouth daily.   Yes [provider]  atorvastatin (LIPITOR) 40 MG tablet Take 1 tablet (40 mg total) by mouth daily. 06/15/17  Yes Sowles, Drue Stager, MD  calcitRIOL (ROCALTROL) 0.25 MCG capsule Take 0.25 mcg by mouth daily.  04/02/17  Yes [provider]   carvedilol (COREG) 6.25 MG tablet Take 6.25 mg by mouth 2 (two) times daily with a meal.  04/05/15  Yes Lateef, Munsoor, MD  Cholecalciferol (VITAMIN D) 2000 UNITS CAPS Take 1 capsule by mouth daily.    Yes [provider]  citalopram (CELEXA) 10 MG tablet Take 10 mg by mouth daily. 09/06/17  Yes [provider]  Cyanocobalamin (VITAMIN B-12) 1000 MCG SUBL Take 1 tablet by mouth daily.   Yes [provider]  Dulaglutide (TRULICITY) 1.5 VE/7.2CN SOPN INJECT 1.5 MG SUBCUTANEOUSLY EVERY 7 (SEVEN) DAYS. 10/02/16  Yes [provider]  metFORMIN (GLUCOPHAGE) 1000 MG tablet TAKE 1 TABLET BY MOUTH TWICE A DAY 07/30/17  Yes [provider]  Olmesartan-amLODIPine-HCTZ 40-10-25 MG TABS Take 1 tablet by mouth daily. 06/15/17  Yes Sowles, Drue Stager, MD  sodium bicarbonate 650 MG tablet TAKE TWO (2) TABLETS BY MOUTH TWICE A DAY 03/27/16  Yes [provider]  tamsulosin (FLOMAX) 0.4 MG CAPS capsule Take 0.4 mg by mouth daily after breakfast.    Yes [provider]  Blood Glucose Monitoring Suppl (ONE TOUCH ULTRA SYSTEM KIT) W/DEVICE KIT 1 kit by Does not apply route once.    [provider]  gentamicin cream (GARAMYCIN) 0.1 % Apply 1 application topically 3 (three) times daily. Patient not taking: Reported on 09/25/2017 07/03/17   Edrick Kins, DPM      VITAL SIGNS:  Blood pressure 139/73, pulse 78, temperature 98.5 F (36.9 C), temperature source Oral, resp. rate (!) 22, height _0  (1.956 m), weight 105.5 kg, SpO2 97 %.  PHYSICAL EXAMINATION:  GENERAL:  72 y.o.-year-old patient lying in the bed with no acute distress.  EYES: Pupils equal, round, reactive to light and accommodation. No scleral icterus. Extraocular muscles intact.  HEENT: Head atraumatic, normocephalic. Oropharynx and nasopharynx clear.  NECK:  Supple, no jugular venous distention. No thyroid enlargement, no tenderness.  LUNGS: Normal breath sounds bilaterally, no wheezing,  rales,rhonchi or crepitation. No use of accessory muscles of respiration.  CARDIOVASCULAR: S1, S2 normal. No murmurs, rubs, or gallops.  ABDOMEN: Soft, nontender, nondistended. Bowel sounds present. No organomegaly or mass.  EXTREMITIES: No pedal edema, cyanosis, or clubbing.  Patient left arm is not swollen, not red complains of tenderness to touch. NEUROLOGIC: Cranial nerves II through XII are intact. Muscle strength 5/5 on right side upper, lower extremity.  Patient however on the left arm is limited because of pain patient is not able to lift left arm.  Left leg power is 5 /5.  Extremities. Sensation intact. Gait not checked.  PSYCHIATRIC: The patient is alert and oriented x 3.  SKIN: No obvious rash, lesion, or ulcer.   LABORATORY PANEL:   CBC Recent Labs  Lab 09/25/17 1337  WBC 15.8*  HGB 10.6*  HCT 31.3*  PLT 226   ------------------------------------------------------------------------------------------------------------------  Chemistries  Recent Labs  Lab 09/25/17 1337  NA 136  K 3.7  CL 96*  CO2 27  GLUCOSE 214*  BUN 54*  CREATININE 5.78*  CALCIUM 8.5*  AST 16  ALT 9  ALKPHOS 74  BILITOT 0.6   ------------------------------------------------------------------------------------------------------------------  Cardiac Enzymes Recent Labs  Lab 09/25/17 1337  TROPONINI 0.03*   ------------------------------------------------------------------------------------------------------------------  RADIOLOGY:  Ct Head Wo Contrast  Result Date: 09/25/2017 CLINICAL DATA:  Altered level of consciousness, unexplained left arm weakness, no injury EXAM: CT HEAD WITHOUT CONTRAST TECHNIQUE: Contiguous axial images were obtained from the base of the skull through the vertex without intravenous contrast. COMPARISON:  None. FINDINGS: Brain: The ventricular system is slightly prominent as are the cortical sulci diffusely, consistent with atrophy. The septum is midline in position.  There may be mild small vessel ischemic change in the periventricular white matter. However, no hemorrhage, mass lesion, or acute infarction is seen. Vascular: No vascular abnormality is noted on this unenhanced study. Skull: On bone window images, no calvarial abnormality is seen. Sinuses/Orbits: The paranasal sinuses appear pneumatized. Other: None. IMPRESSION: Diffuse atrophy.  No acute intracranial abnormality. Electronically Signed   By: Ivar Drape M.D.   On: 09/25/2017 13:58   Mr [name] Head Wo Contrast  Result Date: 09/25/2017 CLINICAL DATA:  Left arm weakness and pain. EXAM: MRI HEAD WITHOUT CONTRAST MRA HEAD WITHOUT CONTRAST TECHNIQUE: Multiplanar, multiecho pulse sequences of the brain and surrounding structures were obtained without intravenous contrast. Angiographic images of the head were obtained using MRA technique without contrast. COMPARISON:  Head CT 09/25/2017 FINDINGS: MRI HEAD FINDINGS Brain: There is no evidence of acute infarct, intracranial hemorrhage, mass, midline shift, or extra-axial fluid collection. There is moderate generalized cerebral atrophy. T2 hyperintensities are most notable in the parietal white matter and are nonspecific but compatible with mild chronic small vessel ischemic disease. Vascular: Major intracranial vascular flow voids are preserved. Skull and upper cervical spine: Unremarkable bone marrow signal. Sinuses/Orbits: Unremarkable orbits. Minimal left maxillary sinus mucosal thickening. Clear mastoid air cells. Other: Incidental small lipoma along the posterior margin of the left parotid gland. MRA HEAD FINDINGS The visualized distal vertebral arteries are patent to the basilar and codominant. Patent right PICA, bilateral AICA, and bilateral SCA origins are identified. The basilar artery is widely patent. PCAs are patent without evidence of significant stenosis. The internal carotid arteries are widely patent from skull base to carotid termini with tortuosity of both  distal cervical ICA is noted. ACAs and MCAs are patent without evidence of proximal branch occlusion. There is evidence of a moderate proximal left M1 stenosis, however motion artifact is also noted through this region which may artifactually exaggerate the true degree of narrowing. No aneurysm is identified. IMPRESSION: 1. No acute intracranial abnormality. 2. Mild chronic small vessel ischemic disease. 3. Patent circle of Willis without large vessel occlusion. Moderate left M1 stenosis. Electronically Signed   By: Logan Bores M.D.   On: 09/25/2017 17:18   Mr Brain Wo Contrast  Result Date: 09/25/2017 CLINICAL DATA:  Left arm weakness and pain. EXAM: MRI HEAD WITHOUT CONTRAST MRA HEAD WITHOUT CONTRAST TECHNIQUE: Multiplanar, multiecho pulse sequences of the brain and surrounding structures were obtained without intravenous contrast. Angiographic images of the head were obtained using MRA technique without contrast. COMPARISON:  Head CT 09/25/2017 FINDINGS: MRI HEAD FINDINGS Brain: There is no evidence of acute infarct, intracranial hemorrhage, mass, midline shift, or extra-axial fluid collection. There is moderate generalized cerebral atrophy. T2 hyperintensities are most notable in the parietal white matter and are nonspecific but  compatible with mild chronic small vessel ischemic disease. Vascular: Major intracranial vascular flow voids are preserved. Skull and upper cervical spine: Unremarkable bone marrow signal. Sinuses/Orbits: Unremarkable orbits. Minimal left maxillary sinus mucosal thickening. Clear mastoid air cells. Other: Incidental small lipoma along the posterior margin of the left parotid gland. MRA HEAD FINDINGS The visualized distal vertebral arteries are patent to the basilar and codominant. Patent right PICA, bilateral AICA, and bilateral SCA origins are identified. The basilar artery is widely patent. PCAs are patent without evidence of significant stenosis. The internal carotid arteries are  widely patent from skull base to carotid termini with tortuosity of both distal cervical ICA is noted. ACAs and MCAs are patent without evidence of proximal branch occlusion. There is evidence of a moderate proximal left M1 stenosis, however motion artifact is also noted through this region which may artifactually exaggerate the true degree of narrowing. No aneurysm is identified. IMPRESSION: 1. No acute intracranial abnormality. 2. Mild chronic small vessel ischemic disease. 3. Patent circle of Willis without large vessel occlusion. Moderate left M1 stenosis. Electronically Signed   By: Logan Bores M.D.   On: 09/25/2017 17:18   US Carotid Bilateral (at [facility] Only)  Result Date: 09/25/2017 CLINICAL DATA:  TIA.  Left arm pain and weakness. EXAM: BILATERAL CAROTID DUPLEX ULTRASOUND TECHNIQUE: Pearline Cables scale imaging, color Doppler and duplex ultrasound were performed of bilateral carotid and vertebral arteries in the neck. COMPARISON:  None. FINDINGS: Criteria: Quantification of carotid stenosis is based on velocity parameters that correlate the residual internal carotid diameter with NASCET-based stenosis levels, using the diameter of the distal internal carotid lumen as the denominator for stenosis measurement. The following velocity measurements were obtained: RIGHT ICA: 62/16 cm/sec CCA: 16/60 cm/sec SYSTOLIC ICA/CCA RATIO:  0.8 ECA: 171 cm/sec LEFT ICA: 48/15 cm/sec CCA: 63/01 cm/sec SYSTOLIC ICA/CCA RATIO:  0.6 ECA: 92 cm/sec RIGHT CAROTID ARTERY: Mild atherosclerotic change at the ball RIGHT VERTEBRAL ARTERY:  Antegrade flow LEFT CAROTID ARTERY:  Mild atherosclerotic change at the ball LEFT VERTEBRAL ARTERY:  Antegrade flow Blood pressure not reported. The distal ICAs could not be evaluated due to high bifurcations bilaterally. IMPRESSION: The distal internal carotid arteries cannot be evaluated on this study due to high bifurcations. Within visualized limits, there are mild atherosclerotic changes of the  bulb. No rate limiting stenosis seen in either ICA. Electronically Signed   By: Dorise Bullion III M.D   On: 09/25/2017 18:00   Dg Shoulder Left  Result Date: 09/25/2017 CLINICAL DATA:  Left shoulder and arm pain. EXAM: LEFT SHOULDER - 2+ VIEW COMPARISON:  None. FINDINGS: Mild degenerative changes in the shoulder. No fracture or dislocation. IMPRESSION: Mild degenerative changes. Electronically Signed   By: Dorise Bullion III M.D   On: 09/25/2017 18:02    EKG:   Orders placed or performed during the hospital encounter of 09/25/17  . EKG 12-Lead  . EKG 12-Lead  . ED EKG  . ED EKG   EKG shows sinus rhythm with 92 bpm. IMPRESSION AND PLAN:   72 year old male patient with multiple medical problems of hypertension, diabetes mellitus type 2, ESRD on peritoneal dialysis comes in because of left arm weakness, left arm pain. 1.  Left arm, left leg weakness, patient mainly complains of left arm pain unable to use it because of pain.  No evidence of swelling, redness of the left arm.  Patient admitted to stroke unit, will finish complete stroke work-up looks like patient MRI of the brain did not  show stroke ultrasound of carotids also did not show any hemodynamically significant stenosis.  Continue aspirin, monitor overnight for neurological changes. 2.  Left shoulder pain, x-ray of the left shoulder did not show acute change likely due to degeneration, check CK, continue pain medicine, possibly need orthopedic evaluation if patient left arm pain is not improved with pain medicines. 3.  Diabetes mellitus type 2: Hold metformin, continue sliding scale insulin with coverage. 4.  ESRD on peritoneal dialysis, nephrology is consulted, continue peritoneal dialysis.     All the records are reviewed and case discussed with ED provider. Management plans discussed with the patient, family and they are in agreement.  CODE STATUS:full  TOTAL TIME TAKING CARE OF THIS PATIENT:55 minutes.    Epifanio Lesches M.D on 09/25/2017 at 6:31 PM  Between 7am to 6pm - Pager - [phone]  After 6pm go to [email or web address] - password [email or web address] [facility]  [facility]  Office  [phone]  CC: Primary care physician; Steele Sizer, MD  Note: This dictation was prepared with Dragon dictation along with smaller phrase technology. Any transcriptional errors that result from this process are unintentional.

## 2017-09-25 NOTE — ED Notes (Signed)
Date and time results received: 09/25/17 1445 (use smartphrase ".now" to insert current time)  Test: Troponin  Critical Value: 0.03  Name of Provider Notified: Dr. Jimmye Norman   Orders Received? Or Actions Taken?: No orders received

## 2017-09-25 NOTE — ED Triage Notes (Signed)
Pt c/o lt arm pain/weakness that began last night.

## 2017-09-25 NOTE — ED Provider Notes (Addendum)
[facility] Emergency Department Provider Note       Time seen: ----------------------------------------- 1:45 PM on 09/25/2017 -----------------------------------------   I have reviewed the triage vital signs and the nursing notes.  HISTORY   Chief Complaint Weakness    HPI Joseph Newman is a 72 y.o. male with a history of arthritis, CKD, hyperlipidemia, hypertension who presents to the ED for left arm pain and weakness that began last night.  Patient essentially on arrival cannot move his left arm and has severe pain with range of motion of the left arm, particularly at the wrist.  He denies any specific numbness or tingling.  Patient is a peritoneal dialysis patient, dialysis has been going normally since last week.  Past Medical History:  Diagnosis Date  . Arthritis   . Chronic airway obstruction, not elsewhere classified   . 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 Holley Raring, nephrologist  . Depressive disorder, not elsewhere classified   . Heart murmur   . Hyperlipidemia   . Hypertension   . Hypertrophy of prostate without urinary obstruction and other lower urinary tract symptoms (LUTS)   . Impotence of organic origin   . Microalbuminuria   . 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 Obesity, unspecified   . Plantar wart   . Sleep apnea   . Synovitis and tenosynovitis, unspecified   . Type II or unspecified type diabetes mellitus without mention of complication, uncontrolled   . Unspecified venous (peripheral) insufficiency     Patient Active Problem List   Diagnosis Date Noted  . Dyslipidemia associated with type 2 diabetes mellitus ([place]) 03/13/2016  . Diabetic ulcer of toe of right foot associated with type 2 diabetes mellitus, limited to breakdown of skin ([place]) 03/07/2016  . Microscopic hematuria 05/30/2015  . 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  Dyslipidemia 08/31/2014  . Tobacco abuse 08/31/2014  . Diabetes mellitus with renal manifestation ([place]) 08/31/2014  . COPD (chronic obstructive pulmonary disease) ([place]) 08/31/2014  . Microalbuminuria 08/31/2014  . ED (erectile dysfunction) 08/31/2014  . Hypertension   . Dyspnea 10/09/2011    Past Surgical History:  Procedure Laterality Date  . foot surgery      Allergies Patient has no known allergies.  Social History Social History   Tobacco Use  . Smoking status: Current Every Day Smoker    Packs/day: 0.25    Years: 30.00    Pack years: 7.50    Types: Cigarettes, Cigars    Last attempt to quit: 02/25/2003    Years since quitting: 14.5  . Smokeless tobacco: Never Used  . Tobacco comment: he is cutting down, used to smoke one pack day   Substance Use Topics  . Alcohol use: Yes    Alcohol/week: 0.0 standard drinks    Comment: occasional  . Drug use: No   Review of Systems Constitutional: Negative for fever. Cardiovascular: Negative for chest pain. Respiratory: Negative for shortness of breath. Gastrointestinal: Negative for abdominal pain, vomiting and diarrhea. Musculoskeletal: Positive for left arm pain Skin: Negative for rash. Neurological: Negative for headaches, positive for left arm weakness  All systems negative/normal/unremarkable except as stated in the HPI  ____________________________________________   PHYSICAL EXAM:  VITAL SIGNS: ED Triage Vitals  Enc Vitals Group     BP 09/25/17 1330 126/63     Pulse Rate 09/25/17 1330 91     Resp 09/25/17 1330 (!) 22     Temp 09/25/17  1330 99.3 F (37.4 C)     Temp Source 09/25/17 1330 Oral     SpO2 09/25/17 1330 94 %     Weight 09/25/17 1331 232 lb 9.4 oz (105.5 kg)     Height 09/25/17 1331 6\' 5"  (1.956 m)     Head Circumference --      Peak Flow --      Pain Score 09/25/17 1331 10     Pain Loc --      Pain Edu? --      Excl. in [place]? --    Constitutional: Alert and oriented. Well appearing and in no  distress. Eyes: Conjunctivae are normal. Normal extraocular movements. ENT   Head: Normocephalic and atraumatic.   Nose: No congestion/rhinnorhea.   Mouth/Throat: Mucous membranes are moist.   Neck: No stridor. Cardiovascular: Normal rate, regular rhythm. No murmurs, rubs, or gallops. Respiratory: Normal respiratory effort without tachypnea nor retractions. Breath sounds are clear and equal bilaterally. No wheezes/rales/rhonchi. Gastrointestinal: Soft and nontender. Normal bowel sounds.  Peritoneal dialysis catheter is in place. Musculoskeletal:  Left upper extremity is diffusely tender to touch from wrist to elbow with no appreciable swelling or erythema Neurologic:  Normal speech and language.  Extensive left upper extremity weakness, perhaps 1or 2 out of 5 strength in left arm compared to right.  Normal strength appreciated in the lower extremities, cranial nerves appear to be normal. Skin:  Skin is warm, dry and intact. No rash noted. Psychiatric: Mood and affect are normal. Speech and behavior are normal.  ____________________________________________  EKG: Interpreted by me.  Sinus rhythm rate 92 bpm, low voltage, nonspecific ST segment changes, normal QT  ____________________________________________  ED COURSE:  As part of my medical decision making, I reviewed the following data within the [place] History obtained from family if available, nursing notes, old chart and ekg, as well as notes from prior ED visits. Patient presented for left arm pain and weakness, we will assess with labs and imaging as indicated at this time.   Procedures ____________________________________________   LABS (pertinent positives/negatives)  Labs Reviewed  CBC - Abnormal; Notable for the following components:      Result Value   WBC 15.8 (*)    RBC 3.64 (*)    Hemoglobin 10.6 (*)    HCT 31.3 (*)    RDW 15.2 (*)    All other components within normal limits   DIFFERENTIAL - Abnormal; Notable for the following components:   Neutro Abs 12.8 (*)    Monocytes Absolute 1.5 (*)    All other components within normal limits  COMPREHENSIVE METABOLIC PANEL - Abnormal; Notable for the following components:   Chloride 96 (*)    Glucose, Bld 214 (*)    BUN 54 (*)    Creatinine, Ser 5.78 (*)    Calcium 8.5 (*)    Albumin 2.9 (*)    GFR calc non Af Amer 9 (*)    GFR calc Af Amer 10 (*)    All other components within normal limits  TROPONIN I - Abnormal; Notable for the following components:   Troponin I 0.03 (*)    All other components within normal limits  GLUCOSE, CAPILLARY - Abnormal; Notable for the following components:   Glucose-Capillary 179 (*)    All other components within normal limits  CULTURE, BLOOD (ROUTINE X 2)  CULTURE, BLOOD (ROUTINE X 2)  ETHANOL  PROTIME-INR  APTT  URINE DRUG SCREEN, QUALITATIVE ([facility] ONLY)  URINALYSIS, ROUTINE W  REFLEX MICROSCOPIC  URIC ACID  LACTIC ACID, PLASMA   CRITICAL CARE Performed by: Laurence Aly   Total critical care time: 30 minutes  Critical care time was exclusive of separately billable procedures and treating other patients.  Critical care was necessary to treat or prevent imminent or life-threatening deterioration.  Critical care was time spent personally by me on the following activities: development of treatment plan with patient and/or surrogate as well as nursing, discussions with consultants, evaluation of patient's response to treatment, examination of patient, obtaining history from patient or surrogate, ordering and performing treatments and interventions, ordering and review of laboratory studies, ordering and review of radiographic studies, pulse oximetry and re-evaluation of patient's condition.  RADIOLOGY Images were viewed by me  CT head IMPRESSION: Diffuse atrophy.  No acute intracranial abnormality. ____________________________________________  DIFFERENTIAL  DIAGNOSIS   CVA, TIA, hypoglycemia, septic arthritis, gout  FINAL ASSESSMENT AND PLAN  Left arm weakness, leukocytosis    Plan: The patient had presented for left arm weakness. Patient's labs revealed a leukocytosis with a left shift of uncertain etiology.  I do not appreciate any infection at this point.  His arm is diffusely tender which is unusual without any source for infection.  Patient has little to no strength in left arm and seemingly has no other focal neurologic deficits.  Patient's imaging did not reveal any acute process but he will require MRI of his brain.  He is also borderline febrile now we will add blood cultures.  They have not had trouble with his peritoneal dialysis. I will discuss with the hospitalist for admission   Laurence Aly, MD   Note: This note was generated in part or whole with voice recognition software. Voice recognition is usually quite accurate but there are transcription errors that can and very often do occur. I apologize for any typographical errors that were not detected and corrected.     Earleen Newport, MD 09/25/17 1449    Earleen Newport, MD 09/25/17 1450

## 2017-09-25 NOTE — ED Notes (Signed)
Patient transported to MRI 

## 2017-09-26 ENCOUNTER — Inpatient Hospital Stay: Payer: Medicare Other

## 2017-09-26 ENCOUNTER — Other Ambulatory Visit: Payer: Self-pay

## 2017-09-26 DIAGNOSIS — M25562 Pain in left knee: Secondary | ICD-10-CM | POA: Diagnosis present

## 2017-09-26 DIAGNOSIS — Z7982 Long term (current) use of aspirin: Secondary | ICD-10-CM | POA: Diagnosis not present

## 2017-09-26 DIAGNOSIS — N186 End stage renal disease: Secondary | ICD-10-CM | POA: Diagnosis not present

## 2017-09-26 DIAGNOSIS — E785 Hyperlipidemia, unspecified: Secondary | ICD-10-CM | POA: Diagnosis present

## 2017-09-26 DIAGNOSIS — M25462 Effusion, left knee: Secondary | ICD-10-CM | POA: Diagnosis present

## 2017-09-26 DIAGNOSIS — R52 Pain, unspecified: Secondary | ICD-10-CM | POA: Diagnosis present

## 2017-09-26 DIAGNOSIS — M1990 Unspecified osteoarthritis, unspecified site: Secondary | ICD-10-CM | POA: Diagnosis present

## 2017-09-26 DIAGNOSIS — Z7984 Long term (current) use of oral hypoglycemic drugs: Secondary | ICD-10-CM | POA: Diagnosis not present

## 2017-09-26 DIAGNOSIS — N529 Male erectile dysfunction, unspecified: Secondary | ICD-10-CM | POA: Diagnosis present

## 2017-09-26 DIAGNOSIS — I132 Hypertensive heart and chronic kidney disease with heart failure and with stage 5 chronic kidney disease, or end stage renal disease: Secondary | ICD-10-CM | POA: Diagnosis present

## 2017-09-26 DIAGNOSIS — Z8249 Family history of ischemic heart disease and other diseases of the circulatory system: Secondary | ICD-10-CM | POA: Diagnosis not present

## 2017-09-26 DIAGNOSIS — F17210 Nicotine dependence, cigarettes, uncomplicated: Secondary | ICD-10-CM | POA: Diagnosis present

## 2017-09-26 DIAGNOSIS — A419 Sepsis, unspecified organism: Secondary | ICD-10-CM | POA: Diagnosis present

## 2017-09-26 DIAGNOSIS — E1140 Type 2 diabetes mellitus with diabetic neuropathy, unspecified: Secondary | ICD-10-CM | POA: Diagnosis present

## 2017-09-26 DIAGNOSIS — M25512 Pain in left shoulder: Secondary | ICD-10-CM | POA: Diagnosis present

## 2017-09-26 DIAGNOSIS — M109 Gout, unspecified: Secondary | ICD-10-CM | POA: Diagnosis present

## 2017-09-26 DIAGNOSIS — E11649 Type 2 diabetes mellitus with hypoglycemia without coma: Secondary | ICD-10-CM | POA: Diagnosis present

## 2017-09-26 DIAGNOSIS — J449 Chronic obstructive pulmonary disease, unspecified: Secondary | ICD-10-CM | POA: Diagnosis not present

## 2017-09-26 DIAGNOSIS — F329 Major depressive disorder, single episode, unspecified: Secondary | ICD-10-CM | POA: Diagnosis present

## 2017-09-26 DIAGNOSIS — G4730 Sleep apnea, unspecified: Secondary | ICD-10-CM | POA: Diagnosis present

## 2017-09-26 DIAGNOSIS — Z79899 Other long term (current) drug therapy: Secondary | ICD-10-CM | POA: Diagnosis not present

## 2017-09-26 DIAGNOSIS — E1122 Type 2 diabetes mellitus with diabetic chronic kidney disease: Secondary | ICD-10-CM | POA: Diagnosis present

## 2017-09-26 LAB — BODY FLUID CELL COUNT WITH DIFFERENTIAL
Eos, Fluid: 0 %
LYMPHS FL: 87 %
MONOCYTE-MACROPHAGE-SEROUS FLUID: 9 %
Neutrophil Count, Fluid: 4 %
WBC FLUID: 52 uL

## 2017-09-26 LAB — BASIC METABOLIC PANEL
ANION GAP: 13 (ref 5–15)
BUN: 63 mg/dL — AB (ref 8–23)
CHLORIDE: 95 mmol/L — AB (ref 98–111)
CO2: 28 mmol/L (ref 22–32)
Calcium: 8.2 mg/dL — ABNORMAL LOW (ref 8.9–10.3)
Creatinine, Ser: 6.08 mg/dL — ABNORMAL HIGH (ref 0.61–1.24)
GFR calc Af Amer: 9 mL/min — ABNORMAL LOW (ref >60)
GFR, EST NON AFRICAN AMERICAN: 8 mL/min — AB (ref >60)
GLUCOSE: 184 mg/dL — AB (ref 70–99)
POTASSIUM: 3.9 mmol/L (ref 3.5–5.1)
Sodium: 136 mmol/L (ref 135–145)

## 2017-09-26 LAB — HEMOGLOBIN A1C
Hgb A1c MFr Bld: 7.2 % — ABNORMAL HIGH (ref 4.8–5.6)
MEAN PLASMA GLUCOSE: 159.94 mg/dL

## 2017-09-26 LAB — LIPID PANEL
CHOL/HDL RATIO: 2.3 ratio
Cholesterol: 66 mg/dL (ref 0–200)
HDL: 29 mg/dL — AB (ref >40)
LDL CALC: 28 mg/dL (ref 0–99)
TRIGLYCERIDES: 44 mg/dL (ref <150)
VLDL: 9 mg/dL (ref 0–40)

## 2017-09-26 LAB — CBC
HCT: 28.9 % — ABNORMAL LOW (ref 40.0–52.0)
HEMOGLOBIN: 10.1 g/dL — AB (ref 13.0–18.0)
MCH: 29.8 pg (ref 26.0–34.0)
MCHC: 35 g/dL (ref 32.0–36.0)
MCV: 85.3 fL (ref 80.0–100.0)
Platelets: 205 10*3/uL (ref 150–440)
RBC: 3.39 MIL/uL — AB (ref 4.40–5.90)
RDW: 15 % — ABNORMAL HIGH (ref 11.5–14.5)
WBC: 18.6 10*3/uL — AB (ref 3.8–10.6)

## 2017-09-26 LAB — GLUCOSE, CAPILLARY
GLUCOSE-CAPILLARY: 215 mg/dL — AB (ref 70–99)
GLUCOSE-CAPILLARY: 172 mg/dL — AB (ref 70–99)
GLUCOSE-CAPILLARY: 203 mg/dL — AB (ref 70–99)
Glucose-Capillary: 172 mg/dL — ABNORMAL HIGH (ref 70–99)

## 2017-09-26 LAB — ECHOCARDIOGRAM COMPLETE
HEIGHTINCHES: 77 in
WEIGHTICAEL: 3721.36 [oz_av]

## 2017-09-26 LAB — URIC ACID: Uric Acid, Serum: 7 mg/dL (ref 3.7–8.6)

## 2017-09-26 LAB — SEDIMENTATION RATE: Sed Rate: 127 mm/hr — ABNORMAL HIGH (ref 0–20)

## 2017-09-26 MED ORDER — DELFLEX-LC/1.5% DEXTROSE 344 MOSM/L IP SOLN
INTRAPERITONEAL | Status: DC
  Administered 2017-09-28: 21:00:00 14 L via INTRAPERITONEAL
  Filled 2017-09-26 (×4): qty 3000

## 2017-09-26 MED ORDER — SODIUM CHLORIDE 0.9 % IV SOLN
INTRAVENOUS | Status: DC | PRN
  Administered 2017-09-26: 21:00:00 500 mL via INTRAVENOUS

## 2017-09-26 MED ORDER — GENTAMICIN SULFATE 0.1 % EX CREA
1.0000 "application " | TOPICAL_CREAM | Freq: Every day | CUTANEOUS | Status: DC
  Administered 2017-09-28 – 2017-09-29 (×2): 1 via TOPICAL
  Filled 2017-09-26 (×2): qty 15

## 2017-09-26 MED ORDER — HEPARIN SODIUM (PORCINE) 1000 UNIT/ML IJ SOLN
500.0000 [IU] | INTRAMUSCULAR | Status: DC | PRN
  Filled 2017-09-26: qty 0.5

## 2017-09-26 NOTE — Progress Notes (Signed)
Inpatient Diabetes Program Recommendations  AACE/ADA: New Consensus Statement on Inpatient Glycemic Control (2015)  Target Ranges:  Prepandial:   less than 140 mg/dL      Peak postprandial:   less than 180 mg/dL (1-2 hours)      Critically ill patients:  140 - 180 mg/dL   Lab Results  Component Value Date   GLUCAP 215 (H) 09/26/2017   HGBA1C 7.2 (H) 09/26/2017    Review of Glycemic Control Results for LYDELL, MOGA (MRN [id number]) as of 09/26/2017 13:41  Ref. Range 09/25/2017 13:30 09/25/2017 20:22 09/26/2017 07:44 09/26/2017 11:40  Glucose-Capillary Latest Ref Range: 70 - 99 mg/dL 179 (H) 183 (H) 172 (H) 215 (H)   Diabetes history: DM2 Outpatient Diabetes medications:  Trulicity 1.5 mg weekly, Metformin 1000 mg bid Current orders for Inpatient glycemic control:  Novolog sensitive tid with meals and HS Inpatient Diabetes Program Recommendations:   May consider adding Lantus 10 units daily.   Thanks,  Adah Perl, RN, BC-ADM Inpatient Diabetes Coordinator Pager [phone] (8a-5p)

## 2017-09-26 NOTE — Progress Notes (Signed)
Held 1700 betablocker d/t blood pressure of 97/57 and scheduled dialysis tonight 09/26/2017. Pt complaining of severe pain. BP of 97/57. Dialysis scheduled for tonight. Discussed with pt and pt spouse the effects of medication on blood pressure and that dialysis would cause loss of any medications processed by kidney. Agreed upon plan is to rest arm not move and revisit possible administration of  pain medication with increase of blood pressure or possibly following completion of dialysis given consideration of blood pressure at that time.

## 2017-09-26 NOTE — Progress Notes (Signed)
[facility], [place] 09/26/17  Subjective:   Patient known to our practice from outpatient dialysis He presents for severe elbow and left wrist pain Low-grade fever of 99.8 Denies any abdominal pain No problems with peritoneal dialysis at home Noted to have elevated white count of 18.6 this morning   Objective:  Vital signs in last 24 hours:  Temp:  [97.7 F (36.5 C)-99 F (37.2 C)] 99 F (37.2 C) (09/04 1016) Pulse Rate:  [76-89] 89 (09/04 1016) Resp:  [17-22] 22 (09/04 0635) BP: (107-148)/(64-84) 124/74 (09/04 1016) SpO2:  [91 %-100 %] 100 % (09/04 1016)  Weight change:  Filed Weights   09/25/17 1331  Weight: 105.5 kg    Intake/Output:    Intake/Output Summary (Last 24 hours) at 09/26/2017 1440 Last data filed at 09/26/2017 1034 Gross per 24 hour  Intake 336.23 ml  Output -  Net 336.23 ml     Physical Exam: General:  No acute distress, laying in the bed  HEENT  anicteric, moist oral mucous membranes  Neck  supple  Pulm/lungs  lungs are clear to auscultation bilaterally  CVS/Heart  regular rhythm, no rub or gallop  Abdomen:   Soft, nontender  Extremities:  No peripheral edema, Left elbow swelling, left wrist swelling  Neurologic:  Alert, oriented  Skin:  No acute rashes  Access:  PD catheter, exit site nontender       Basic Metabolic Panel:  Recent Labs  Lab 09/25/17 1337 09/26/17 0819  NA 136 136  K 3.7 3.9  CL 96* 95*  CO2 27 28  GLUCOSE 214* 184*  BUN 54* 63*  CREATININE 5.78* 6.08*  CALCIUM 8.5* 8.2*     CBC: Recent Labs  Lab 09/25/17 1337 09/26/17 0819  WBC 15.8* 18.6*  NEUTROABS 12.8*  --   HGB 10.6* 10.1*  HCT 31.3* 28.9*  MCV 85.9 85.3  PLT 226 205     No results found for: HEPBSAG, HEPBSAB, HEPBIGM    Microbiology:  Recent Results (from the past 240 hour(s))  Blood culture (routine x 2)     Status: None (Preliminary result)   Collection Time: 09/25/17  1:37 PM  Result Value Ref Range  Status   Specimen Description BLOOD RIGHT FA  Final   Special Requests   Final    BOTTLES DRAWN AEROBIC AND ANAEROBIC Blood Culture results may not be optimal due to an excessive volume of blood received in culture bottles   Culture   Final    NO GROWTH < 24 HOURS Performed at [facility], [address] [place], [place] [address]    Report Status PENDING  Incomplete  Blood culture (routine x 2)     Status: None (Preliminary result)   Collection Time: 09/25/17  2:51 PM  Result Value Ref Range Status   Specimen Description BLOOD RIGHT ANTECUBITAL  Final   Special Requests   Final    BOTTLES DRAWN AEROBIC AND ANAEROBIC Blood Culture results may not be optimal due to an excessive volume of blood received in culture bottles   Culture  Setup Time PENDING  Incomplete   Culture   Final    NO GROWTH < 24 HOURS Performed at [facility], [address] [place], [place] [address]    Report Status PENDING  Incomplete    Coagulation Studies: Recent Labs    09/25/17 1337  LABPROT 14.3  INR 1.12    Urinalysis: No results for input(s): COLORURINE, LABSPEC, [place], GLUCOSEU, [place], BILIRUBINUR,  KETONESUR, PROTEINUR, UROBILINOGEN, NITRITE, LEUKOCYTESUR in the last 72 hours.  Invalid input(s): APPERANCEUR    Imaging: Ct Head Wo Contrast  Result Date: 09/25/2017 CLINICAL DATA:  Altered level of consciousness, unexplained left arm weakness, no injury EXAM: CT HEAD WITHOUT CONTRAST TECHNIQUE: Contiguous axial images were obtained from the base of the skull through the vertex without intravenous contrast. COMPARISON:  None. FINDINGS: Brain: The ventricular system is slightly prominent as are the cortical sulci diffusely, consistent with atrophy. The septum is midline in position. There may be mild small vessel ischemic change in the periventricular white matter. However, no hemorrhage, mass lesion, or acute infarction is seen. Vascular: No vascular abnormality is noted on  this unenhanced study. Skull: On bone window images, no calvarial abnormality is seen. Sinuses/Orbits: The paranasal sinuses appear pneumatized. Other: None. IMPRESSION: Diffuse atrophy.  No acute intracranial abnormality. Electronically Signed   By: Ivar Drape M.D.   On: 09/25/2017 13:58   Mr [name] Head Wo Contrast  Result Date: 09/25/2017 CLINICAL DATA:  Left arm weakness and pain. EXAM: MRI HEAD WITHOUT CONTRAST MRA HEAD WITHOUT CONTRAST TECHNIQUE: Multiplanar, multiecho pulse sequences of the brain and surrounding structures were obtained without intravenous contrast. Angiographic images of the head were obtained using MRA technique without contrast. COMPARISON:  Head CT 09/25/2017 FINDINGS: MRI HEAD FINDINGS Brain: There is no evidence of acute infarct, intracranial hemorrhage, mass, midline shift, or extra-axial fluid collection. There is moderate generalized cerebral atrophy. T2 hyperintensities are most notable in the parietal white matter and are nonspecific but compatible with mild chronic small vessel ischemic disease. Vascular: Major intracranial vascular flow voids are preserved. Skull and upper cervical spine: Unremarkable bone marrow signal. Sinuses/Orbits: Unremarkable orbits. Minimal left maxillary sinus mucosal thickening. Clear mastoid air cells. Other: Incidental small lipoma along the posterior margin of the left parotid gland. MRA HEAD FINDINGS The visualized distal vertebral arteries are patent to the basilar and codominant. Patent right PICA, bilateral AICA, and bilateral SCA origins are identified. The basilar artery is widely patent. PCAs are patent without evidence of significant stenosis. The internal carotid arteries are widely patent from skull base to carotid termini with tortuosity of both distal cervical ICA is noted. ACAs and MCAs are patent without evidence of proximal branch occlusion. There is evidence of a moderate proximal left M1 stenosis, however motion artifact is also  noted through this region which may artifactually exaggerate the true degree of narrowing. No aneurysm is identified. IMPRESSION: 1. No acute intracranial abnormality. 2. Mild chronic small vessel ischemic disease. 3. Patent circle of Willis without large vessel occlusion. Moderate left M1 stenosis. Electronically Signed   By: Logan Bores M.D.   On: 09/25/2017 17:18   Mr Brain Wo Contrast  Result Date: 09/25/2017 CLINICAL DATA:  Left arm weakness and pain. EXAM: MRI HEAD WITHOUT CONTRAST MRA HEAD WITHOUT CONTRAST TECHNIQUE: Multiplanar, multiecho pulse sequences of the brain and surrounding structures were obtained without intravenous contrast. Angiographic images of the head were obtained using MRA technique without contrast. COMPARISON:  Head CT 09/25/2017 FINDINGS: MRI HEAD FINDINGS Brain: There is no evidence of acute infarct, intracranial hemorrhage, mass, midline shift, or extra-axial fluid collection. There is moderate generalized cerebral atrophy. T2 hyperintensities are most notable in the parietal white matter and are nonspecific but compatible with mild chronic small vessel ischemic disease. Vascular: Major intracranial vascular flow voids are preserved. Skull and upper cervical spine: Unremarkable bone marrow signal. Sinuses/Orbits: Unremarkable orbits. Minimal left maxillary sinus mucosal thickening. Clear mastoid air cells.  Other: Incidental small lipoma along the posterior margin of the left parotid gland. MRA HEAD FINDINGS The visualized distal vertebral arteries are patent to the basilar and codominant. Patent right PICA, bilateral AICA, and bilateral SCA origins are identified. The basilar artery is widely patent. PCAs are patent without evidence of significant stenosis. The internal carotid arteries are widely patent from skull base to carotid termini with tortuosity of both distal cervical ICA is noted. ACAs and MCAs are patent without evidence of proximal branch occlusion. There is evidence  of a moderate proximal left M1 stenosis, however motion artifact is also noted through this region which may artifactually exaggerate the true degree of narrowing. No aneurysm is identified. IMPRESSION: 1. No acute intracranial abnormality. 2. Mild chronic small vessel ischemic disease. 3. Patent circle of Willis without large vessel occlusion. Moderate left M1 stenosis. Electronically Signed   By: Logan Bores M.D.   On: 09/25/2017 17:18   US Carotid Bilateral (at [facility] Only)  Result Date: 09/25/2017 CLINICAL DATA:  TIA.  Left arm pain and weakness. EXAM: BILATERAL CAROTID DUPLEX ULTRASOUND TECHNIQUE: Pearline Cables scale imaging, color Doppler and duplex ultrasound were performed of bilateral carotid and vertebral arteries in the neck. COMPARISON:  None. FINDINGS: Criteria: Quantification of carotid stenosis is based on velocity parameters that correlate the residual internal carotid diameter with NASCET-based stenosis levels, using the diameter of the distal internal carotid lumen as the denominator for stenosis measurement. The following velocity measurements were obtained: RIGHT ICA: 62/16 cm/sec CCA: 54/62 cm/sec SYSTOLIC ICA/CCA RATIO:  0.8 ECA: 171 cm/sec LEFT ICA: 48/15 cm/sec CCA: 70/35 cm/sec SYSTOLIC ICA/CCA RATIO:  0.6 ECA: 92 cm/sec RIGHT CAROTID ARTERY: Mild atherosclerotic change at the ball RIGHT VERTEBRAL ARTERY:  Antegrade flow LEFT CAROTID ARTERY:  Mild atherosclerotic change at the ball LEFT VERTEBRAL ARTERY:  Antegrade flow Blood pressure not reported. The distal ICAs could not be evaluated due to high bifurcations bilaterally. IMPRESSION: The distal internal carotid arteries cannot be evaluated on this study due to high bifurcations. Within visualized limits, there are mild atherosclerotic changes of the bulb. No rate limiting stenosis seen in either ICA. Electronically Signed   By: Dorise Bullion III M.D   On: 09/25/2017 18:00   Dg Shoulder Left  Result Date: 09/25/2017 CLINICAL DATA:   Left shoulder and arm pain. EXAM: LEFT SHOULDER - 2+ VIEW COMPARISON:  None. FINDINGS: Mild degenerative changes in the shoulder. No fracture or dislocation. IMPRESSION: Mild degenerative changes. Electronically Signed   By: Dorise Bullion III M.D   On: 09/25/2017 18:02     Medications:   . cefTRIAXone (ROCEPHIN)  IV Stopped (09/25/17 2110)   . amLODipine  10 mg Oral Daily  . aspirin  81 mg Oral Daily  . atorvastatin  40 mg Oral Daily  . calcitRIOL  0.25 mcg Oral Daily  . carvedilol  6.25 mg Oral BID WC  . cholecalciferol  1,000 Units Oral Daily  . citalopram  10 mg Oral Daily  . heparin  5,000 Units Subcutaneous Q8H  . hydrochlorothiazide  25 mg Oral Daily  . insulin aspart  0-5 Units Subcutaneous QHS  . insulin aspart  0-9 Units Subcutaneous TID WC  . irbesartan  300 mg Oral Daily  . sodium bicarbonate  650 mg Oral BID  . vitamin B-12  1,000 mcg Oral Daily   acetaminophen **OR** acetaminophen (TYLENOL) oral liquid 160 mg/5 mL **OR** acetaminophen, oxyCODONE-acetaminophen  Assessment/ Plan:  72 y.o. African-American male with COPD, arthritis, end-stage renal disease,  depression, sleep apnea, diabetes type 2 with neuropathy, hypertension,  Presents with left elbow and wrist swelling and low-grade fever  Peritoneal dialysis/[facility]/ 107 kg/ 4 x 3000 cc, last fill 2000 cc  1.  End-stage renal disease 2.  Left elbow and left wrist pain and swelling 3.  Anemia of chronic kidney disease 4.  Secondary hyperparathyroidism  We will continue peritoneal dialysis.  With elevated WBC count and low-grade fever, need to rule out infection.  Obtain urinalysis.  We will obtain PD cell count and culture.  MRI of the left elbow has been ordered by primary team.  Uric acid is in normal range and patient has no known history of gout.  Infectious arthritis is suspected.  Consider broadening the coverage to cover MRSA with vancomycin. Blood cultures are negative so far. We will follow  closely   LOS: 0 Corissa Oguinn Candiss Norse 9/4/20192:40 PM  [place], [place]  Note: This note was prepared with Dragon dictation. Any transcription errors are unintentional

## 2017-09-26 NOTE — Progress Notes (Signed)
OT Cancellation Note  Patient Details Name: Joseph Newman MRN: [id number] DOB: [birth year]   Cancelled Treatment:    Reason Eval/Treat Not Completed: Other (comment). Pt pending MRI of LUE, orthopedic surgery consult pending. Will hold OT evaluation this date and re-attempt next date pending updated plan of care and medical appropriateness.   Jeni Salles, MPH, MS, OTR/L ascom [phone] 09/26/17, 4:34 PM

## 2017-09-26 NOTE — Consult Note (Signed)
ORTHOPAEDIC CONSULTATION  REQUESTING PHYSICIAN: Bettey Costa, MD  Chief Complaint: Left arm pain  HPI: Joseph Newman is a 72 y.o. male who complains of  Left elbow and arm pain without trauma for 2-3 days.  He is on peritoneal dialysis and has history of hypertension, DM, ESRD, and hyperlipidemia. Attempt to get MRI of elbow failed.  On IV Rocephin.  C/O pain in left elbow and upper arm.  Stroke work up apparently negative.  No history of gout.   Past Medical History:  Diagnosis Date  . Arthritis   . Chronic airway obstruction, not elsewhere classified   . 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 Holley Raring, nephrologist  . Depressive disorder, not elsewhere classified   . Heart murmur   . Hyperlipidemia   . Hypertension   . Hypertrophy of prostate without urinary obstruction and other lower urinary tract symptoms (LUTS)   . Impotence of organic origin   . Microalbuminuria   . 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 Obesity, unspecified   . Plantar wart   . Sleep apnea   . Synovitis and tenosynovitis, unspecified   . Type II or unspecified type diabetes mellitus without mention of complication, uncontrolled   . Unspecified venous (peripheral) insufficiency    Past Surgical History:  Procedure Laterality Date  . foot surgery     Social History   Socioeconomic History  . Marital status: Married    Spouse name: Not on file  . Number of children: 2  . Years of education: Not on file  . Highest education level: Not on file  Occupational History  . Not on file  Social Needs  . Financial resource strain: Patient refused  . Food insecurity:    Worry: Patient refused    Inability: Patient refused  . Transportation needs:    Medical: Patient refused    Non-medical: Patient refused  Tobacco Use  . Smoking status: Current Every Day Smoker    Packs/day: 0.25    Years: 30.00    Pack years: 7.50    Types: Cigarettes, Cigars    Last attempt  to quit: 02/25/2003    Years since quitting: 14.5  . Smokeless tobacco: Never Used  . Tobacco comment: he is cutting down, used to smoke one pack day   Substance and Sexual Activity  . Alcohol use: Yes    Alcohol/week: 0.0 standard drinks    Comment: occasional  . Drug use: No  . Sexual activity: Not Currently  Lifestyle  . Physical activity:    Days per week: Patient refused    Minutes per session: Patient refused  . Stress: Patient refused  Relationships  . Social connections:    Talks on phone: Patient refused    Gets together: Patient refused    Attends religious service: Patient refused    Active member of club or organization: Patient refused    Attends meetings of clubs or organizations: Patient refused    Relationship status: Patient refused  Other Topics Concern  . Not on file  Social History Narrative   Lives with wife   Family History  Problem Relation Age of Onset  . Hypertension Mother   . Kidney disease Neg Hx   . Prostate cancer Neg Hx    No Known Allergies Prior to Admission medications   Medication Sig Start Date End Date Taking? Authorizing Provider  aspirin EC 81 MG tablet Take 81 mg by mouth daily.   Yes [provider]  atorvastatin (LIPITOR) 40 MG tablet Take 1 tablet (40 mg total) by mouth daily. 06/15/17  Yes Sowles, Drue Stager, MD  calcitRIOL (ROCALTROL) 0.25 MCG capsule Take 0.25 mcg by mouth daily.  04/02/17  Yes [provider]  carvedilol (COREG) 6.25 MG tablet Take 6.25 mg by mouth 2 (two) times daily with a meal.  04/05/15  Yes Lateef, Munsoor, MD  Cholecalciferol (VITAMIN D) 2000 UNITS CAPS Take 1 capsule by mouth daily.    Yes [provider]  citalopram (CELEXA) 10 MG tablet Take 10 mg by mouth daily. 09/06/17  Yes [provider]  Cyanocobalamin (VITAMIN B-12) 1000 MCG SUBL Take 1 tablet by mouth daily.   Yes [provider]  Dulaglutide (TRULICITY) 1.5 BJ/6.2GB SOPN INJECT 1.5 MG SUBCUTANEOUSLY EVERY 7  (SEVEN) DAYS. 10/02/16  Yes [provider]  metFORMIN (GLUCOPHAGE) 1000 MG tablet TAKE 1 TABLET BY MOUTH TWICE A DAY 07/30/17  Yes [provider]  Olmesartan-amLODIPine-HCTZ 40-10-25 MG TABS Take 1 tablet by mouth daily. 06/15/17  Yes Sowles, Drue Stager, MD  sodium bicarbonate 650 MG tablet TAKE TWO (2) TABLETS BY MOUTH TWICE A DAY 03/27/16  Yes [provider]  tamsulosin (FLOMAX) 0.4 MG CAPS capsule Take 0.4 mg by mouth daily after breakfast.    Yes [provider]  Blood Glucose Monitoring Suppl (ONE TOUCH ULTRA SYSTEM KIT) W/DEVICE KIT 1 kit by Does not apply route once.    [provider]  gentamicin cream (GARAMYCIN) 0.1 % Apply 1 application topically 3 (three) times daily. Patient not taking: Reported on 09/25/2017 07/03/17   Edrick Kins, DPM   Ct Head Wo Contrast  Result Date: 09/25/2017 CLINICAL DATA:  Altered level of consciousness, unexplained left arm weakness, no injury EXAM: CT HEAD WITHOUT CONTRAST TECHNIQUE: Contiguous axial images were obtained from the base of the skull through the vertex without intravenous contrast. COMPARISON:  None. FINDINGS: Brain: The ventricular system is slightly prominent as are the cortical sulci diffusely, consistent with atrophy. The septum is midline in position. There may be mild small vessel ischemic change in the periventricular white matter. However, no hemorrhage, mass lesion, or acute infarction is seen. Vascular: No vascular abnormality is noted on this unenhanced study. Skull: On bone window images, no calvarial abnormality is seen. Sinuses/Orbits: The paranasal sinuses appear pneumatized. Other: None. IMPRESSION: Diffuse atrophy.  No acute intracranial abnormality. Electronically Signed   By: Ivar Drape M.D.   On: 09/25/2017 13:58   Mr Joseph Newman Head Wo Contrast  Result Date: 09/25/2017 CLINICAL DATA:  Left arm weakness and pain. EXAM: MRI HEAD WITHOUT CONTRAST MRA HEAD WITHOUT CONTRAST TECHNIQUE: Multiplanar,  multiecho pulse sequences of the brain and surrounding structures were obtained without intravenous contrast. Angiographic images of the head were obtained using MRA technique without contrast. COMPARISON:  Head CT 09/25/2017 FINDINGS: MRI HEAD FINDINGS Brain: There is no evidence of acute infarct, intracranial hemorrhage, mass, midline shift, or extra-axial fluid collection. There is moderate generalized cerebral atrophy. T2 hyperintensities are most notable in the parietal white matter and are nonspecific but compatible with mild chronic small vessel ischemic disease. Vascular: Major intracranial vascular flow voids are preserved. Skull and upper cervical spine: Unremarkable bone marrow signal. Sinuses/Orbits: Unremarkable orbits. Minimal left maxillary sinus mucosal thickening. Clear mastoid air cells. Other: Incidental small lipoma along the posterior margin of the left parotid gland. MRA HEAD FINDINGS The visualized distal vertebral arteries are patent to the basilar and codominant. Patent right PICA, bilateral AICA, and bilateral SCA  origins are identified. The basilar artery is widely patent. PCAs are patent without evidence of significant stenosis. The internal carotid arteries are widely patent from skull base to carotid termini with tortuosity of both distal cervical ICA is noted. ACAs and MCAs are patent without evidence of proximal branch occlusion. There is evidence of a moderate proximal left M1 stenosis, however motion artifact is also noted through this region which may artifactually exaggerate the true degree of narrowing. No aneurysm is identified. IMPRESSION: 1. No acute intracranial abnormality. 2. Mild chronic small vessel ischemic disease. 3. Patent circle of Willis without large vessel occlusion. Moderate left M1 stenosis. Electronically Signed   By: Logan Bores M.D.   On: 09/25/2017 17:18   Mr Brain Wo Contrast  Result Date: 09/25/2017 CLINICAL DATA:  Left arm weakness and pain. EXAM: MRI  HEAD WITHOUT CONTRAST MRA HEAD WITHOUT CONTRAST TECHNIQUE: Multiplanar, multiecho pulse sequences of the brain and surrounding structures were obtained without intravenous contrast. Angiographic images of the head were obtained using MRA technique without contrast. COMPARISON:  Head CT 09/25/2017 FINDINGS: MRI HEAD FINDINGS Brain: There is no evidence of acute infarct, intracranial hemorrhage, mass, midline shift, or extra-axial fluid collection. There is moderate generalized cerebral atrophy. T2 hyperintensities are most notable in the parietal white matter and are nonspecific but compatible with mild chronic small vessel ischemic disease. Vascular: Major intracranial vascular flow voids are preserved. Skull and upper cervical spine: Unremarkable bone marrow signal. Sinuses/Orbits: Unremarkable orbits. Minimal left maxillary sinus mucosal thickening. Clear mastoid air cells. Other: Incidental small lipoma along the posterior margin of the left parotid gland. MRA HEAD FINDINGS The visualized distal vertebral arteries are patent to the basilar and codominant. Patent right PICA, bilateral AICA, and bilateral SCA origins are identified. The basilar artery is widely patent. PCAs are patent without evidence of significant stenosis. The internal carotid arteries are widely patent from skull base to carotid termini with tortuosity of both distal cervical ICA is noted. ACAs and MCAs are patent without evidence of proximal branch occlusion. There is evidence of a moderate proximal left M1 stenosis, however motion artifact is also noted through this region which may artifactually exaggerate the true degree of narrowing. No aneurysm is identified. IMPRESSION: 1. No acute intracranial abnormality. 2. Mild chronic small vessel ischemic disease. 3. Patent circle of Willis without large vessel occlusion. Moderate left M1 stenosis. Electronically Signed   By: Logan Bores M.D.   On: 09/25/2017 17:18   US Carotid Bilateral (at  [facility])  Result Date: 09/25/2017 CLINICAL DATA:  TIA.  Left arm pain and weakness. EXAM: BILATERAL CAROTID DUPLEX ULTRASOUND TECHNIQUE: Pearline Cables scale imaging, color Doppler and duplex ultrasound were performed of bilateral carotid and vertebral arteries in the neck. COMPARISON:  None. FINDINGS: Criteria: Quantification of carotid stenosis is based on velocity parameters that correlate the residual internal carotid diameter with NASCET-based stenosis levels, using the diameter of the distal internal carotid lumen as the denominator for stenosis measurement. The following velocity measurements were obtained: RIGHT ICA: 62/16 cm/sec CCA: 81/85 cm/sec SYSTOLIC ICA/CCA RATIO:  0.8 ECA: 171 cm/sec LEFT ICA: 48/15 cm/sec CCA: 63/14 cm/sec SYSTOLIC ICA/CCA RATIO:  0.6 ECA: 92 cm/sec RIGHT CAROTID ARTERY: Mild atherosclerotic change at the ball RIGHT VERTEBRAL ARTERY:  Antegrade flow LEFT CAROTID ARTERY:  Mild atherosclerotic change at the ball LEFT VERTEBRAL ARTERY:  Antegrade flow Blood pressure not reported. The distal ICAs could not be evaluated due to high bifurcations bilaterally. IMPRESSION: The distal internal carotid arteries cannot  be evaluated on this study due to high bifurcations. Within visualized limits, there are mild atherosclerotic changes of the bulb. No rate limiting stenosis seen in either ICA. Electronically Signed   By: Dorise Bullion III M.D   On: 09/25/2017 18:00   Dg Shoulder Left  Result Date: 09/25/2017 CLINICAL DATA:  Left shoulder and arm pain. EXAM: LEFT SHOULDER - 2+ VIEW COMPARISON:  None. FINDINGS: Mild degenerative changes in the shoulder. No fracture or dislocation. IMPRESSION: Mild degenerative changes. Electronically Signed   By: Dorise Bullion III M.D   On: 09/25/2017 18:02    Positive ROS: All other systems have been reviewed and were otherwise negative with the exception of those mentioned in the HPI and as above.  Physical Exam: General: Alert, no acute  distress Cardiovascular: No pedal edema Respiratory: No cyanosis, no use of accessory musculature GI: No organomegaly, abdomen is soft and non-tender Skin: No lesions in the area of chief complaint Neurologic: Sensation intact distally Psychiatric: Patient is competent for consent with normal mood and affect Lymphatic: No axillary or cervical lymphadenopathy  MUSCULOSKELETAL: Lying quietly in bed.  Alert and cooperative.  Head and neck unremarkable.  Left shoulder motion stiff and mildly painful. Left elbow painful to touch and movement without overt redness or swelling but is warm.  Some weakness of extension of left wrist. No obvious abscess of elbow region. No other areas of pain or swelling    Assessment: Left elbow pain- possible gout vs infection.   Plan: Uric acid, ESR, CRP Continue IV antibiotics.  Consider prednisone if uric acid elevated. kpad to left elbow X-ray left elbow.    Park Breed, MD [phone]   09/26/2017 7:21 PM

## 2017-09-26 NOTE — Progress Notes (Addendum)
[facility] [place] NAME: Joseph Newman    MR#:  [id number]  DATE OF BIRTH:  Dec 09, 1945  SUBJECTIVE:   Patient complaining of left elbow pain cannot move his arm because of the elbow pain denies recent trauma.  REVIEW OF SYSTEMS:    Review of Systems  Constitutional: Negative for fever, chills weight loss HENT: Negative for ear pain, nosebleeds, congestion, facial swelling, rhinorrhea, neck pain, neck stiffness and ear discharge.   Respiratory: Negative for cough, shortness of breath, wheezing  Cardiovascular: Negative for chest pain, palpitations and leg swelling.  Gastrointestinal: Negative for heartburn, abdominal pain, vomiting, diarrhea or consitpation Genitourinary: Negative for dysuria, urgency, frequency, hematuria Musculoskeletal: Positive for left elbow pain   neurological: Negative for dizziness, seizures, syncope, focal weakness,  numbness and headaches.  Hematological: Does not bruise/bleed easily.  Psychiatric/Behavioral: Negative for hallucinations, confusion, dysphoric mood    Tolerating Diet: yes      DRUG ALLERGIES:  No Known Allergies  VITALS:  Blood pressure 124/74, pulse 89, temperature 99 F (37.2 C), temperature source Oral, resp. rate (!) 22, height 6\' 5"  (1.956 m), weight 105.5 kg, SpO2 100 %.  PHYSICAL EXAMINATION:  Constitutional: Appears well-developed and well-nourished. No distress. HENT: Normocephalic. Marland Kitchen Oropharynx is clear and moist.  Eyes: Conjunctivae and EOM are normal. PERRLA, no scleral icterus.  Neck: Normal ROM. Neck supple. No JVD. No tracheal deviation. CVS: RRR, S1/S2 +, no murmurs, no gallops, no carotid bruit.  Pulmonary: Effort and breath sounds normal, no stridor, rhonchi, wheezes, rales.  Abdominal: Soft. BS +,  no distension, tenderness, rebound or guarding.  Musculoskeletal: Patient with left elbow tenderness and swelling cannot move left arm/elbow positive joint line tenderness no  erythema noted Neuro: Alert. CN 2-12 grossly intact. No focal deficits. Skin: Skin is warm and dry. No rash noted. Psychiatric: Normal mood and affect.      LABORATORY PANEL:   CBC Recent Labs  Lab 09/26/17 0819  WBC 18.6*  HGB 10.1*  HCT 28.9*  PLT 205   ------------------------------------------------------------------------------------------------------------------  Chemistries  Recent Labs  Lab 09/25/17 1337 09/26/17 0819  NA 136 136  K 3.7 3.9  CL 96* 95*  CO2 27 28  GLUCOSE 214* 184*  BUN 54* 63*  CREATININE 5.78* 6.08*  CALCIUM 8.5* 8.2*  AST 16  --   ALT 9  --   ALKPHOS 74  --   BILITOT 0.6  --    ------------------------------------------------------------------------------------------------------------------  Cardiac Enzymes Recent Labs  Lab 09/25/17 1337  TROPONINI 0.03*   ------------------------------------------------------------------------------------------------------------------  RADIOLOGY:  Ct Head Wo Contrast  Result Date: 09/25/2017 CLINICAL DATA:  Altered level of consciousness, unexplained left arm weakness, no injury EXAM: CT HEAD WITHOUT CONTRAST TECHNIQUE: Contiguous axial images were obtained from the base of the skull through the vertex without intravenous contrast. COMPARISON:  None. FINDINGS: Brain: The ventricular system is slightly prominent as are the cortical sulci diffusely, consistent with atrophy. The septum is midline in position. There may be mild small vessel ischemic change in the periventricular white matter. However, no hemorrhage, mass lesion, or acute infarction is seen. Vascular: No vascular abnormality is noted on this unenhanced study. Skull: On bone window images, no calvarial abnormality is seen. Sinuses/Orbits: The paranasal sinuses appear pneumatized. Other: None. IMPRESSION: Diffuse atrophy.  No acute intracranial abnormality. Electronically Signed   By: Ivar Drape M.D.   On: 09/25/2017 13:58   Mr [name] Head Wo  Contrast  Result Date: 09/25/2017 CLINICAL DATA:  Left arm weakness and pain. EXAM: MRI HEAD WITHOUT CONTRAST MRA HEAD WITHOUT CONTRAST TECHNIQUE: Multiplanar, multiecho pulse sequences of the brain and surrounding structures were obtained without intravenous contrast. Angiographic images of the head were obtained using MRA technique without contrast. COMPARISON:  Head CT 09/25/2017 FINDINGS: MRI HEAD FINDINGS Brain: There is no evidence of acute infarct, intracranial hemorrhage, mass, midline shift, or extra-axial fluid collection. There is moderate generalized cerebral atrophy. T2 hyperintensities are most notable in the parietal white matter and are nonspecific but compatible with mild chronic small vessel ischemic disease. Vascular: Major intracranial vascular flow voids are preserved. Skull and upper cervical spine: Unremarkable bone marrow signal. Sinuses/Orbits: Unremarkable orbits. Minimal left maxillary sinus mucosal thickening. Clear mastoid air cells. Other: Incidental small lipoma along the posterior margin of the left parotid gland. MRA HEAD FINDINGS The visualized distal vertebral arteries are patent to the basilar and codominant. Patent right PICA, bilateral AICA, and bilateral SCA origins are identified. The basilar artery is widely patent. PCAs are patent without evidence of significant stenosis. The internal carotid arteries are widely patent from skull base to carotid termini with tortuosity of both distal cervical ICA is noted. ACAs and MCAs are patent without evidence of proximal branch occlusion. There is evidence of a moderate proximal left M1 stenosis, however motion artifact is also noted through this region which may artifactually exaggerate the true degree of narrowing. No aneurysm is identified. IMPRESSION: 1. No acute intracranial abnormality. 2. Mild chronic small vessel ischemic disease. 3. Patent circle of Willis without large vessel occlusion. Moderate left M1 stenosis.  Electronically Signed   By: Logan Bores M.D.   On: 09/25/2017 17:18   Mr Brain Wo Contrast  Result Date: 09/25/2017 CLINICAL DATA:  Left arm weakness and pain. EXAM: MRI HEAD WITHOUT CONTRAST MRA HEAD WITHOUT CONTRAST TECHNIQUE: Multiplanar, multiecho pulse sequences of the brain and surrounding structures were obtained without intravenous contrast. Angiographic images of the head were obtained using MRA technique without contrast. COMPARISON:  Head CT 09/25/2017 FINDINGS: MRI HEAD FINDINGS Brain: There is no evidence of acute infarct, intracranial hemorrhage, mass, midline shift, or extra-axial fluid collection. There is moderate generalized cerebral atrophy. T2 hyperintensities are most notable in the parietal white matter and are nonspecific but compatible with mild chronic small vessel ischemic disease. Vascular: Major intracranial vascular flow voids are preserved. Skull and upper cervical spine: Unremarkable bone marrow signal. Sinuses/Orbits: Unremarkable orbits. Minimal left maxillary sinus mucosal thickening. Clear mastoid air cells. Other: Incidental small lipoma along the posterior margin of the left parotid gland. MRA HEAD FINDINGS The visualized distal vertebral arteries are patent to the basilar and codominant. Patent right PICA, bilateral AICA, and bilateral SCA origins are identified. The basilar artery is widely patent. PCAs are patent without evidence of significant stenosis. The internal carotid arteries are widely patent from skull base to carotid termini with tortuosity of both distal cervical ICA is noted. ACAs and MCAs are patent without evidence of proximal branch occlusion. There is evidence of a moderate proximal left M1 stenosis, however motion artifact is also noted through this region which may artifactually exaggerate the true degree of narrowing. No aneurysm is identified. IMPRESSION: 1. No acute intracranial abnormality. 2. Mild chronic small vessel ischemic disease. 3. Patent  circle of Willis without large vessel occlusion. Moderate left M1 stenosis. Electronically Signed   By: Logan Bores M.D.   On: 09/25/2017 17:18   US Carotid Bilateral (at [facility] Only)  Result Date: 09/25/2017 CLINICAL DATA:  TIA.  Left arm pain and weakness. EXAM: BILATERAL CAROTID DUPLEX ULTRASOUND TECHNIQUE: Pearline Cables scale imaging, color Doppler and duplex ultrasound were performed of bilateral carotid and vertebral arteries in the neck. COMPARISON:  None. FINDINGS: Criteria: Quantification of carotid stenosis is based on velocity parameters that correlate the residual internal carotid diameter with NASCET-based stenosis levels, using the diameter of the distal internal carotid lumen as the denominator for stenosis measurement. The following velocity measurements were obtained: RIGHT ICA: 62/16 cm/sec CCA: 24/40 cm/sec SYSTOLIC ICA/CCA RATIO:  0.8 ECA: 171 cm/sec LEFT ICA: 48/15 cm/sec CCA: 10/27 cm/sec SYSTOLIC ICA/CCA RATIO:  0.6 ECA: 92 cm/sec RIGHT CAROTID ARTERY: Mild atherosclerotic change at the ball RIGHT VERTEBRAL ARTERY:  Antegrade flow LEFT CAROTID ARTERY:  Mild atherosclerotic change at the ball LEFT VERTEBRAL ARTERY:  Antegrade flow Blood pressure not reported. The distal ICAs could not be evaluated due to high bifurcations bilaterally. IMPRESSION: The distal internal carotid arteries cannot be evaluated on this study due to high bifurcations. Within visualized limits, there are mild atherosclerotic changes of the bulb. No rate limiting stenosis seen in either ICA. Electronically Signed   By: Dorise Bullion III M.D   On: 09/25/2017 18:00   Dg Shoulder Left  Result Date: 09/25/2017 CLINICAL DATA:  Left shoulder and arm pain. EXAM: LEFT SHOULDER - 2+ VIEW COMPARISON:  None. FINDINGS: Mild degenerative changes in the shoulder. No fracture or dislocation. IMPRESSION: Mild degenerative changes. Electronically Signed   By: Dorise Bullion III M.D   On: 09/25/2017 18:02     ASSESSMENT AND PLAN:    72 year old male with history of end-stage renal disease on peritoneal dialysis,essential hypertension and diabetes who presented to the emergency room due to left arm weakness and pain.  1.  Sepsis with left elbow pain: Patient presents with leukocytosis and tachypnea.  Sepsis may be due to acute bursitis.   MRI ordered Orthopedic surgery consultation requested PT and OT consultation requested Continue Rocephin for now CVA work-up was negative  2.  End-stage renal disease on peritoneal dialysis: Nephrology consultation requested  3.  Diabetes: Continue outpatient regimen with ADA diet  4.  Essential hypertension: Continue Norvasc, Coreg, HCTZ  5.  Depression: Continue Celexa  6. Tobacco dependence: Patient is encouraged to quit smoking. Counseling was provided for 4 minutes.   Management plans discussed with the patient and he is in agreement.  CODE STATUS: FULL  TOTAL TIME TAKING CARE OF THIS PATIENT: 30 minutes.     POSSIBLE D/C 2 days, DEPENDING ON CLINICAL CONDITION.   Caresse Sedivy M.D on 09/26/2017 at 12:35 PM  Between 7am to 6pm - Pager - [phone] After 6pm go to [email or web address] - password [email or web address] [facility]  Office  [phone]  CC: Primary care physician; Steele Sizer, MD  Note: This dictation was prepared with Dragon dictation along with smaller phrase technology. Any transcriptional errors that result from this process are unintentional.

## 2017-09-26 NOTE — Progress Notes (Addendum)
SLP Cancellation Note  Patient Details Name: Joseph Newman MRN: [id number] DOB: [birth year]   Cancelled treatment:       Reason Eval/Treat Not Completed: SLP screened, no needs identified, will sign off(chart reviewed; consulted NSG then met w/ pt in room). Pt denied any difficulty swallowing and is currently on a regular diet; tolerates swallowing pills w/ water per NSG. Pt conversed at conversational level w/out deficits noted; pt denied any speech-language deficits. NSG reported he is conversing and stating all wants/needs appropriately. He stated pain in his left arm - NSG aware. Pt also requested more grape juice w/ his breakfast foods he was eating.  No further skilled ST services indicated as pt appears at his baseline. Pt agreed. NSG to reconsult if any change in status while admitted.     Orinda Kenner, MS, CCC-SLP Yahya Boldman 09/26/2017, 1:48 PM

## 2017-09-26 NOTE — Clinical Social Work Note (Signed)
Clinical Social Work Assessment  Patient Details  Name: Joseph Newman MRN: [id number] Date of Birth: 1945/09/05  Date of referral:  09/26/17               Reason for consult:  Facility Placement                Permission sought to share information with:  Case Manager, Customer service manager, Family Supports Permission granted to share information::  Yes, Verbal Permission Granted  Name::      SNF  Agency::   [facility]   Relationship::     Contact Information:     Housing/Transportation Living arrangements for the past 2 months:  [place] of Information:  Patient Patient Interpreter Needed:  None Criminal Activity/Legal Involvement Pertinent to Current Situation/Hospitalization:  No - Comment as needed Significant Relationships:  Spouse Lives with:  Spouse Do you feel safe going back to the place where you live?  Yes Need for family participation in patient care:  Yes (Comment)  Care giving concerns:  Patient lives with his wife    Facilities manager / plan:  CSW consulted for SNF placement. CSW spoke with patient regarding Discharge plan. CSW introduced self and explained role. Patient states that he lives with his wife. CSW explained that PT has recommended patient go to SNF for rehab. Patient is in agreement with that. Patient states that he would like to discuss with his wife before making a final decision. Patient is in agreement with doing a bed search. CSW will begin bed search and give bed offers once received. CSW will continue to follow for discharge planning.   Employment status:  Retired Nurse, adult PT Recommendations:  [place] / Referral to community resources:  [place]  Patient/Family's Response to care:  Patient thanked CSW for assistance   Patient/Family's Understanding of and Emotional Response to Diagnosis, Current Treatment, and Prognosis:  Patient is in  agreement with DC plan   Emotional Assessment Appearance:  Appears stated age Attitude/Demeanor/Rapport:    Affect (typically observed):  Accepting, Calm, Pleasant Orientation:  Oriented to Self, Oriented to Place, Oriented to  Time Alcohol / Substance use:  Not Applicable Psych involvement (Current and /or in the community):  No (Comment)  Discharge Needs  Concerns to be addressed:  Discharge Planning Concerns Readmission within the last 30 days:  No Current discharge risk:  None Barriers to Discharge:  Continued Medical Work up   [employer], [place] 09/26/2017, 4:20 PM

## 2017-09-26 NOTE — Progress Notes (Signed)
OT Cancellation Note  Patient Details Name: Joseph Newman MRN: [id number] DOB: 09-Jan-1946   Cancelled Treatment:    Reason Eval/Treat Not Completed: Other (comment). Order received, chart reviewed. Pt working with PT. Will re-attempt at later date/time as pt is available and medically appropriate.   Jeni Salles, MPH, MS, OTR/L ascom [phone] 09/26/17, 10:23 AM

## 2017-09-26 NOTE — Progress Notes (Signed)
Family Meeting Note  Advance Directive:no  Today a meeting took place with the Patient.  The following clinical team members were present during this meeting:MD  The following were discussed:Patient's diagnosis: elbow pain  Patient's progosis: > 12 months and Goals for treatment: Full Code  Additional follow-up to be provided: Chaplain consultation to start advanced directives  Time spent during discussion: 16 minutes  Casyn Becvar, MD

## 2017-09-26 NOTE — Evaluation (Signed)
Physical Therapy Evaluation Patient Details Name: Joseph Newman MRN: [id number] DOB: [birth year] Today's Date: 09/26/2017   History of Present Illness  presented to ER secondary to weakness, inability to use L UE; initially worked up for TIA/CVA (all imaging negative).  Now pending further orthopedic evaluation of L UE.  Clinical Impression  Upon evaluation, patient alert and oriented to basic information; noted delays in processing and task initiation.  Very sensitive to any/all movement of L UE (elbow, wrist) and L LE (knee).  Demonstrates active movement only 1 to 2-/5 with non-functional ROM appreciated.  Prefers to maintain tucked to his side, though elevated on pillows end of treatment session for pain control, edema management. Unable to tolerate functional mobility attempts at this time; deferred pending additional work up and pain control to L UE. Would benefit from skilled PT to address above deficits and promote optimal return to PLOF; recommend transition to STR upon discharge from acute hospitalization pending additional mobility assessment.     Follow Up Recommendations SNF    Equipment Recommendations       Recommendations for Other Services       Precautions / Restrictions Precautions Precautions: Fall Restrictions Weight Bearing Restrictions: No      Mobility  Bed Mobility               General bed mobility comments: unable to tolerate any movement/change of position to L UE; OOB/mobility deferred until work up complete, pain better controlled  Transfers                 General transfer comment: unable to tolerate any movement/change of position to L UE; OOB/mobility deferred until work up complete, pain better controlled  Ambulation/Gait             General Gait Details: unable to tolerate any movement/change of position to L UE; OOB/mobility deferred until work up complete, pain better controlled  Tree surgeon [place] (Stroke Patients Only)       Balance                                             Pertinent Vitals/Pain Pain Assessment: Faces Faces Pain Scale: Hurts worst Pain Location: L elbow, wrist and L knee Pain Descriptors / Indicators: Aching;Grimacing;Guarding Pain Intervention(s): Limited activity within patient's tolerance;Monitored during session;Repositioned;Patient requesting pain meds-RN notified    Home Living Family/patient expects to be discharged to:: Private residence Living Arrangements: Spouse/significant other Available Help at Discharge: Family Type of Home: House Home Access: Stairs to enter Entrance Stairs-Rails: Right Entrance Stairs-Number of Steps: 2 Home Layout: One level        Prior Function Level of Independence: Independent with assistive device(s)         Comments: Mod indep without assist device (intermittent furniture walking) for household distances; [facility] for longer, community distances.  Wife assists with ADLs, household chores as appropriate.     Hand Dominance   Dominant Hand: Right    Extremity/Trunk Assessment   Upper Extremity Assessment Upper Extremity Assessment: (R UE grossly 3-/5; L shoulder grossly 2+/5, elbow and wrist with 1/5 (all limited by pain))    Lower Extremity Assessment Lower Extremity Assessment: (grossly 3-/5 throughout bilat LEs, L knee flexion limited by pain)       Communication  Communication: No difficulties  Cognition Arousal/Alertness: Awake/alert Behavior During Therapy: WFL for tasks assessed/performed Overall Cognitive Status: Within Functional Limits for tasks assessed                                 General Comments: very delayed processing and task initiation; intermittent redirection to task at hand      General Comments      Exercises Other Exercises Other Exercises: L UE elevated on pillows for comfort and edema management; encouraged to  maintain L palm flat (vs fisted) to maintain ROM   Assessment/Plan    PT Assessment Patient needs continued PT services  PT Problem List Decreased strength;Decreased range of motion;Decreased activity tolerance;Decreased balance;Decreased mobility;Decreased knowledge of precautions;Decreased coordination;Decreased cognition;Decreased knowledge of use of DME;Decreased safety awareness;Cardiopulmonary status limiting activity;Pain       PT Treatment Interventions DME instruction;Gait training;Stair training;Functional mobility training;Therapeutic activities;Therapeutic exercise;Balance training;Patient/family education    PT Goals (Current goals can be found in the Care Plan section)  Acute Rehab PT Goals Patient Stated Goal: to decrease L UE pain PT Goal Formulation: With patient Time For Goal Achievement: 10/10/17 Potential to Achieve Goals: Good    Frequency Min 2X/week   Barriers to discharge Decreased caregiver support      Co-evaluation               AM-PAC PT "6 Clicks" Daily Activity  Outcome Measure Difficulty turning over in bed (including adjusting bedclothes, sheets and blankets)?: Unable Difficulty moving from lying on back to sitting on the side of the bed? : Unable Difficulty sitting down on and standing up from a chair with arms (e.g., wheelchair, bedside commode, etc,.)?: Unable Help needed moving to and from a bed to chair (including a wheelchair)?: Total Help needed walking in hospital room?: Total Help needed climbing 3-5 steps with a railing? : Total 6 Click Score: 6    End of Session Equipment Utilized During Treatment: Gait belt Activity Tolerance: Patient limited by pain Patient left: with call bell/phone within reach;with bed alarm set;in bed;with family/visitor present Nurse Communication: Mobility status PT Visit Diagnosis: Muscle weakness (generalized) (M62.81);Difficulty in walking, not elsewhere classified (R26.2);Pain Pain - Right/Left:  Left Pain - part of body: Arm    Time: 1012-1040 PT Time Calculation (min) (ACUTE ONLY): 28 min   Charges:   PT Evaluation $PT Eval Moderate Complexity: 1 Mod PT Treatments $Therapeutic Activity: 8-22 mins       Lewin Pellow H. Owens Shark, PT, DPT, NCS 09/26/17, 2:48 PM [phone]

## 2017-09-26 NOTE — Progress Notes (Signed)
PD tx start, pre PD weight 116.1kg.    09/26/17 2133  Cycler Setup  Total Number of Exchanges 4  Fill Volume 3000  Dianeal Solution Dextrose 1.5% in 6000 mL  Last Fill Volume 2000  Fill Time - Minute(s) 10  Dwell Time - Hour(s) 10  Drain Time - Minute(s) 20 mins  Completion  Exit Site Care Performed Yes  Treatment Status Started  Education / Care Plan  Dialysis Education Provided Yes  Documented Education in Care Plan Yes  Hand-Off documentation  Report given to (Full Name) Linton Ham  Report received from (Full Name) Stark Bray

## 2017-09-26 NOTE — Progress Notes (Signed)
   09/26/17 1420  Clinical Encounter Type  Visited With Other (Comment)  Visit Type Initial;Spiritual support  Referral From Physician  Consult/Referral To Chaplain  Spiritual Encounters  Spiritual Needs Other (Comment)   An AD order was created for Joseph Newman. Upon reviewing the notes in Joseph Newman's chart it was revealed that the patient denied wanting an AD. I will check again with patient as needed.

## 2017-09-26 NOTE — NC FL2 (Signed)
[place] LEVEL OF CARE SCREENING TOOL     IDENTIFICATION  Patient Name: Joseph Newman Birthdate: 11-Mar-1945 Sex: male Admission Date (Current Location): 09/25/2017  [place] and [place] Number:  Engineering geologist and Address:  [facility], [address] [place], [place] [address]      Provider Number: [id number]  Attending Physician Name and Address:  Bettey Costa, MD  Relative Name and Phone Number:  Abdallah Hern- wife [phone]    Current Level of Care: Hospital Recommended Level of Care: [place] Prior Approval Number:    Date Approved/Denied:   PASRR Number: [id number]  Discharge Plan: SNF    Current Diagnoses: Patient Active Problem List   Diagnosis Date Noted  . Sepsis ([place]) 09/26/2017  . Left arm weakness 09/25/2017  . Dyslipidemia associated with type 2 diabetes mellitus ([place]) 03/13/2016  . Diabetic ulcer of toe of right foot associated with type 2 diabetes mellitus, limited to breakdown of skin ([place]) 03/07/2016  . Microscopic hematuria 05/30/2015  . 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 Dyslipidemia 08/31/2014  . Tobacco abuse 08/31/2014  . Diabetes mellitus with renal manifestation ([place]) 08/31/2014  . COPD (chronic obstructive pulmonary disease) ([place]) 08/31/2014  . Microalbuminuria 08/31/2014  . ED (erectile dysfunction) 08/31/2014  . Hypertension   . Dyspnea 10/09/2011    Orientation RESPIRATION BLADDER Height & Weight     Self, Time, Place  Normal Incontinent Weight: 232 lb 9.4 oz (105.5 kg) Height:  6\' 5"  (195.6 cm)  BEHAVIORAL SYMPTOMS/MOOD NEUROLOGICAL BOWEL NUTRITION STATUS  (None ) (None) Incontinent Diet(Heart Healthy, Carb modified )  AMBULATORY STATUS COMMUNICATION OF NEEDS Skin   Extensive Assist Verbally Normal                       Personal Care Assistance Level of Assistance  Bathing, Feeding, Dressing Bathing Assistance:  Limited assistance Feeding assistance: Independent Dressing Assistance: Limited assistance     Functional Limitations Info  Sight, Hearing, Speech Sight Info: Adequate Hearing Info: Adequate Speech Info: Adequate    SPECIAL CARE FACTORS FREQUENCY  PT (By licensed PT), OT (By licensed OT)([facility] )     PT Frequency: 5 OT Frequency: 5            Contractures Contractures Info: Not present    Additional Factors Info  Code Status, Allergies Code Status Info: Full Code  Allergies Info: NKA           Current Medications (09/26/2017):  This is the current hospital active medication list Current Facility-Administered Medications  Medication Dose Route Frequency Provider Last Rate Last Dose  . acetaminophen (TYLENOL) tablet 650 mg  650 mg Oral Q4H PRN Epifanio Lesches, MD   650 mg at 09/26/17 1009   Or  . acetaminophen (TYLENOL) solution 650 mg  650 mg Per Tube Q4H PRN Epifanio Lesches, MD       Or  . acetaminophen (TYLENOL) suppository 650 mg  650 mg Rectal Q4H PRN Epifanio Lesches, MD      . amLODipine (NORVASC) tablet 10 mg  10 mg Oral Daily Epifanio Lesches, MD   10 mg at 09/26/17 1009  . aspirin chewable tablet 81 mg  81 mg Oral Daily Epifanio Lesches, MD   81 mg at 09/26/17 1009  . atorvastatin (LIPITOR) tablet 40 mg  40 mg Oral Daily Epifanio Lesches, MD   40 mg at 09/26/17 1009  . calcitRIOL (ROCALTROL) capsule 0.25 mcg  0.25 mcg Oral Daily Epifanio Lesches, MD   0.25 mcg at 09/26/17 1009  . carvedilol (COREG) tablet 6.25 mg  6.25 mg Oral BID WC Epifanio Lesches, MD   6.25 mg at 09/26/17 1009  . cefTRIAXone (ROCEPHIN) 1 g in sodium chloride 0.9 % 100 mL IVPB  1 g Intravenous Q24H Epifanio Lesches, MD   Stopped at 09/25/17 2110  . cholecalciferol (VITAMIN D) tablet 1,000 Units  1,000 Units Oral Daily Epifanio Lesches, MD   1,000 Units at 09/26/17 1009  . citalopram (CELEXA) tablet 10 mg  10 mg Oral Daily Epifanio Lesches,  MD   10 mg at 09/26/17 1000  . dialysis solution 1.5% low-MG/low-CA dianeal solution   Intraperitoneal Q24H Singh, Harmeet, MD      . gentamicin cream (GARAMYCIN) 0.1 % 1 application  1 application Topical Daily Singh, Harmeet, MD      . heparin injection 5,000 Units  5,000 Units Subcutaneous Q8H Epifanio Lesches, MD   5,000 Units at 09/26/17 1231  . heparin injection 500 Units  500 Units Intraperitoneal PRN Murlean Iba, MD      . hydrochlorothiazide (HYDRODIURIL) tablet 25 mg  25 mg Oral Daily Epifanio Lesches, MD   25 mg at 09/26/17 1009  . insulin aspart (novoLOG) injection 0-5 Units  0-5 Units Subcutaneous QHS Epifanio Lesches, MD      . insulin aspart (novoLOG) injection 0-9 Units  0-9 Units Subcutaneous TID WC Epifanio Lesches, MD   3 Units at 09/26/17 1231  . irbesartan (AVAPRO) tablet 300 mg  300 mg Oral Daily Epifanio Lesches, MD   300 mg at 09/26/17 1009  . oxyCODONE-acetaminophen (PERCOCET/ROXICET) 5-325 MG per tablet 1 tablet  1 tablet Oral Q4H PRN Epifanio Lesches, MD   1 tablet at 09/26/17 1230  . sodium bicarbonate tablet 650 mg  650 mg Oral BID Epifanio Lesches, MD   650 mg at 09/26/17 1009  . vitamin B-12 (CYANOCOBALAMIN) tablet 1,000 mcg  1,000 mcg Oral Daily Epifanio Lesches, MD   1,000 mcg at 09/26/17 1009     Discharge Medications: Please see discharge summary for a list of discharge medications.  Relevant Imaging Results:  Relevant Lab Results:   Additional Information SSN: [id number]  Annamaria Boots, [place]

## 2017-09-27 LAB — C-REACTIVE PROTEIN: CRP: 24.3 mg/dL — ABNORMAL HIGH (ref <1.0)

## 2017-09-27 LAB — CBC
HCT: 30.8 % — ABNORMAL LOW (ref 40.0–52.0)
Hemoglobin: 10.6 g/dL — ABNORMAL LOW (ref 13.0–18.0)
MCH: 29.4 pg (ref 26.0–34.0)
MCHC: 34.6 g/dL (ref 32.0–36.0)
MCV: 85.1 fL (ref 80.0–100.0)
Platelets: 218 10*3/uL (ref 150–440)
RBC: 3.62 MIL/uL — AB (ref 4.40–5.90)
RDW: 15.1 % — ABNORMAL HIGH (ref 11.5–14.5)
WBC: 16.2 10*3/uL — ABNORMAL HIGH (ref 3.8–10.6)

## 2017-09-27 LAB — GLUCOSE, CAPILLARY
Glucose-Capillary: 250 mg/dL — ABNORMAL HIGH (ref 70–99)
Glucose-Capillary: 212 mg/dL — ABNORMAL HIGH (ref 70–99)
Glucose-Capillary: 176 mg/dL — ABNORMAL HIGH (ref 70–99)
Glucose-Capillary: 176 mg/dL — ABNORMAL HIGH (ref 70–99)

## 2017-09-27 LAB — SYNOVIAL CELL COUNT + DIFF, W/ CRYSTALS
Eosinophils-Synovial: 0 %
Lymphocytes-Synovial Fld: 2 %
Monocyte-Macrophage-Synovial Fluid: 9 %
Neutrophil, Synovial: 89 %
Other Cells-SYN: 0
WBC, SYNOVIAL: 15729 /mm3 — AB (ref 0–200)

## 2017-09-27 LAB — BASIC METABOLIC PANEL
Anion gap: 14 (ref 5–15)
BUN: 61 mg/dL — AB (ref 8–23)
CO2: 26 mmol/L (ref 22–32)
Calcium: 8.2 mg/dL — ABNORMAL LOW (ref 8.9–10.3)
Chloride: 93 mmol/L — ABNORMAL LOW (ref 98–111)
Creatinine, Ser: 6 mg/dL — ABNORMAL HIGH (ref 0.61–1.24)
GFR calc Af Amer: 10 mL/min — ABNORMAL LOW (ref >60)
GFR calc non Af Amer: 8 mL/min — ABNORMAL LOW (ref >60)
Glucose, Bld: 238 mg/dL — ABNORMAL HIGH (ref 70–99)
POTASSIUM: 3.6 mmol/L (ref 3.5–5.1)
SODIUM: 133 mmol/L — AB (ref 135–145)

## 2017-09-27 LAB — PATHOLOGIST SMEAR REVIEW

## 2017-09-27 MED ORDER — SODIUM CHLORIDE 0.9 % IV SOLN
2.0000 g | INTRAVENOUS | Status: DC
  Administered 2017-09-27: 19:00:00 2 g via INTRAVENOUS
  Filled 2017-09-27: qty 2
  Filled 2017-09-27: qty 20

## 2017-09-27 MED ORDER — TUBERCULIN PPD 5 UNIT/0.1ML ID SOLN
5.0000 [IU] | Freq: Once | INTRADERMAL | Status: AC
  Administered 2017-09-27: 5 [IU] via INTRADERMAL
  Filled 2017-09-27: qty 0.1

## 2017-09-27 MED ORDER — METHYLPREDNISOLONE SODIUM SUCC 125 MG IJ SOLR
80.0000 mg | Freq: Once | INTRAMUSCULAR | Status: AC
  Administered 2017-09-27: 18:00:00 80 mg via INTRAVENOUS
  Filled 2017-09-27: qty 2

## 2017-09-27 MED ORDER — INSULIN DETEMIR 100 UNIT/ML ~~LOC~~ SOLN
10.0000 [IU] | Freq: Every day | SUBCUTANEOUS | Status: DC
  Administered 2017-09-27 – 2017-09-28 (×2): 10 [IU] via SUBCUTANEOUS
  Filled 2017-09-27 (×4): qty 0.1

## 2017-09-27 MED ORDER — HYDROMORPHONE HCL 1 MG/ML IJ SOLN
1.0000 mg | INTRAMUSCULAR | Status: DC | PRN
  Administered 2017-09-27 – 2017-09-28 (×3): 1 mg via INTRAVENOUS
  Filled 2017-09-27 (×3): qty 1

## 2017-09-27 NOTE — Consult Note (Signed)
Reason for Consult: Joint pain  Referring Physician: Dr. Donnetta Simpers [facility]   HPI: Asked by Dr. Candiss Norse to see patient.  72 year old African-American male.  History of diabetes hypertension.  Just started peritoneal dialysis several days ago.  Had for dialysis treatments.  Started having pain in multiple joints particularly left wrist left elbow.  Did not want extended her to touch it.  Then had some pain in left knee and right hand.  Was admitted.  White count mildly elevated.  Sed rate and CRP significantly elevated.  Uric acid 7.0.  Was started on antibiotics.  No improvement. No prior history of podagra or gout.  No history of inflammatory arthritis.  PMH: Diabetes.  Hyperlipidemia.  Hypertension.  End-stage renal disease  SURGICAL HISTORY: No joint surgeries  Family History: Family history of podagra  Social History: No significant cigarettes or alcohol  Allergies: No Known Allergies  Medications:  Scheduled: . amLODipine  10 mg Oral Daily  . aspirin  81 mg Oral Daily  . atorvastatin  40 mg Oral Daily  . calcitRIOL  0.25 mcg Oral Daily  . carvedilol  6.25 mg Oral BID WC  . cholecalciferol  1,000 Units Oral Daily  . citalopram  10 mg Oral Daily  . gentamicin cream  1 application Topical Daily  . heparin  5,000 Units Subcutaneous Q8H  . hydrochlorothiazide  25 mg Oral Daily  . insulin aspart  0-5 Units Subcutaneous QHS  . insulin aspart  0-9 Units Subcutaneous TID WC  . insulin detemir  10 Units Subcutaneous Daily  . irbesartan  300 mg Oral Daily  . sodium bicarbonate  650 mg Oral BID  . tuberculin  5 Units Intradermal Once  . vitamin B-12  1,000 mcg Oral Daily        ROS: No fever.  No chest pain.  No GI upset.   PHYSICAL EXAM: Blood pressure (!) 120/40, pulse 81, temperature 98.7 F (37.1 C), temperature source Oral, resp. rate 20, height 6\' 5"  (1.956 m), weight 107.5 kg, SpO2 93 %. Somnolent male.  Arouses but does not answer questions.  Does grimace and holler  with pain with joint motion Chest clear.  Nontender abdomen.  Trace edema Musculoskeletal: Good range of motion cervical spine.  Both shoulders move reasonably without significant pain.  Right wrist with synovitis pain with flexion extension.  Left wrist with synovitis pain with flexion extension.  Left elbow contracture.  Cannot palpate definite effusion but will not extend the elbow. Right knee without effusion.  Left knee with large effusion.  MTPs nontender.  Both ankles move well  Procedure: Left knee prepped sterile manner.  Aspirated 20 cc inflammatory fluid.  Sent for microscopy  Assessment: Polyarticular joint pain with inflammation left elbow left wrist right wrist and left knee.  Suspect crystalline.  Gout versus pseudogout.  Doubt infection Diabetes End-stage renal disease on peritoneal dialysis decreased mental status.  May be from analgesia  Recommendations: Solu-Medrol 80 mg x 1 dose IV tonight Await crystal analysis.  Liliane Bade W 09/27/2017, 6:28 PM

## 2017-09-27 NOTE — Progress Notes (Signed)
PD tx start. Pt's pre weight is 121.8kg, family at bedside.    09/27/17 1953  Cycler Setup  Total Number of Exchanges 4  Fill Volume 3000  Dianeal Solution Dextrose 1.5% in 6000 mL  Last Fill Volume 2000  Fill Time - Minute(s) 10  Dwell Time - Hour(s) 10  Drain Time - Minute(s) 20 mins  Completion  Exit Site Care Performed Yes  Treatment Status Started  Fluid Balance - CCPD  Total Intake for Exchanges (mL) 14000 ml (14000)  Education / Care Plan  Dialysis Education Provided Yes  Documented Education in Care Plan Yes  Hand-Off documentation  Report given to (Full Name) Tamela Oddi  Report received from (Full Name) Stark Bray

## 2017-09-27 NOTE — Plan of Care (Signed)
  Problem: Education: Goal: Knowledge of General Education information will improve Description Including pain rating scale, medication(s)/side effects and non-pharmacologic comfort measures Outcome: Progressing   Problem: Health Behavior/Discharge Planning: Goal: Ability to manage health-related needs will improve Outcome: Progressing   Problem: Clinical Measurements: Goal: Ability to maintain clinical measurements within normal limits will improve Outcome: Progressing Goal: Will remain free from infection Outcome: Progressing Goal: Diagnostic test results will improve Outcome: Progressing Goal: Respiratory complications will improve Outcome: Progressing Goal: Cardiovascular complication will be avoided Outcome: Progressing   Problem: Activity: Goal: Risk for activity intolerance will decrease Outcome: Progressing   Problem: Nutrition: Goal: Adequate nutrition will be maintained Outcome: Progressing   Problem: Coping: Goal: Level of anxiety will decrease Outcome: Progressing   Problem: Elimination: Goal: Will not experience complications related to bowel motility Outcome: Progressing Goal: Will not experience complications related to urinary retention Outcome: Progressing   Problem: Pain Managment: Goal: General experience of comfort will improve Outcome: Progressing   Problem: Safety: Goal: Ability to remain free from injury will improve Outcome: Progressing   Problem: Skin Integrity: Goal: Risk for impaired skin integrity will decrease Outcome: Progressing   Problem: Education: Goal: Knowledge of secondary prevention will improve Outcome: Progressing   Problem: Ischemic Stroke/TIA Tissue Perfusion: Goal: Complications of ischemic stroke/TIA will be minimized Outcome: Progressing

## 2017-09-27 NOTE — Progress Notes (Signed)
Subjective: Still having significant pain.  Left elbow and wrist, right wrist. Dr Cristi Loron has seen him and also thinks this is gout.  Remains afebrile and normal white count.  Uric acid 5.7  ESR  And crp elevated. X-ray of elbow unremarkable.        Patient reports pain as severe.  Objective:   VITALS:   Vitals:   09/26/17 1450 09/27/17 1451  BP: (!) 97/57 (!) 120/40  Pulse: 86 81  Resp: 20 20  Temp: 98.2 F (36.8 C) 98.7 F (37.1 C)  SpO2: 90% 93%   No reddness or tissue swelling at elbow.  Wrist swollen Neurologically intact ABD soft Neurovascular intact Sensation intact distally Intact pulses distally Dorsiflexion/Plantar flexion intact  LABS Recent Labs    09/25/17 1337 09/26/17 0819 09/27/17 0432  HGB 10.6* 10.1* 10.6*  HCT 31.3* 28.9* 30.8*  WBC 15.8* 18.6* 16.2*  PLT 226 205 218    Recent Labs    09/25/17 1337 09/26/17 0819 09/27/17 0432  NA 136 136 133*  K 3.7 3.9 3.6  BUN 54* 63* 61*  CREATININE 5.78* 6.08* 6.00*  GLUCOSE 214* 184* 238*    Recent Labs    09/25/17 1337  INR 1.12     Assessment/Plan:  Probable gout,  Agree with IN corticoids No real indicator for infection

## 2017-09-27 NOTE — Progress Notes (Signed)
Inpatient Diabetes Program Recommendations  AACE/ADA: New Consensus Statement on Inpatient Glycemic Control (2015)  Target Ranges:  Prepandial:   less than 140 mg/dL      Peak postprandial:   less than 180 mg/dL (1-2 hours)      Critically ill patients:  140 - 180 mg/dL   Lab Results  Component Value Date   GLUCAP 250 (H) 09/27/2017   HGBA1C 7.2 (H) 09/26/2017    Review of Glycemic Control Results for DABNEY, SCHANZ (MRN [id number]) as of 09/27/2017 11:37  Ref. Range 09/26/2017 07:44 09/26/2017 11:40 09/26/2017 17:17 09/26/2017 22:09 09/27/2017 07:38  Glucose-Capillary Latest Ref Range: 70 - 99 mg/dL 172 (H) 215 (H) 172 (H) 203 (H) 250 (H)   Diabetes history: Type 2 DM  Outpatient Diabetes medications:  Trulicity 1.5 mg weekly, Metformin 1000 mg bid Current orders for Inpatient glycemic control:  Novolog sensitive tid with meals and HS Inpatient Diabetes Program Recommendations:   Note blood sugars > 200 mg/dL.  Consider adding Levemir 10 units daily.    Thanks,  Adah Perl, RN, BC-ADM Inpatient Diabetes Coordinator Pager [phone] (8a-5p)

## 2017-09-27 NOTE — Progress Notes (Signed)
Patient has no acute event overnight.Perinoteal dialysis was set up overnight by dialysis RN. Patient continue to receive PRN pain med for acute pain in the left elbow.

## 2017-09-27 NOTE — Evaluation (Signed)
Occupational Therapy Evaluation Patient Details Name: Joseph Newman MRN: [id number] DOB: 02-17-1945 Today's Date: 09/27/2017    History of Present Illness 72yo male pt presented to ER secondary to weakness, inability to use L UE; initially worked up for TIA/CVA (all imaging negative).  Now pending further orthopedic evaluation of L UE.   Clinical Impression   Pt seen for OT evaluation this date. Prior to hospital admission, pt was living at home with his spouse who assisted pt with ADL and chores as appropriate, as well as with medication mgt. Pt slightly confused during session, appropriately responding to questions ~75% of the time and requiring additional time to process information and initiate movement. Pt with significant pain in LUE (wrist/elbow) with any movement. Pt educated in positioning of LUE on pillows to maximize safety and functional positioning. Pt required max assist for rolling and scooting up in the bed, R lateral lean, requiring max assist to correct and pillows to minimize lean. Pt demonstrates impairments in pain, strength, ROM, cognition, and activity tolerance requiring max to total assist for ADL.  Pt would benefit from skilled OT to address noted impairments and functional limitations (see below for any additional details) in order to maximize safety and independence while minimizing falls risk and caregiver burden.  Upon hospital discharge, recommend pt discharge to [place].     Follow Up Recommendations  SNF    Equipment Recommendations  3 in 1 bedside commode    Recommendations for Other Services       Precautions / Restrictions Precautions Precautions: Fall Restrictions Weight Bearing Restrictions: No      Mobility Bed Mobility Overal bed mobility: Needs Assistance Bed Mobility: Rolling Rolling: +2 for physical assistance;Max assist         General bed mobility comments: slight rolling and assisting with scooting up in bed with pt unable to assist in any  capacity. Max assist x2   Transfers                      Balance Overall balance assessment: Needs assistance   Sitting balance-Leahy Scale: Poor Sitting balance - Comments: long sitting in bed, pt with R lateral lean, required cues to attempt to correct and ultimately requiring max assist and pillows to support more midline long sitting position for self feeding Postural control: Right lateral lean                                 ADL either performed or assessed with clinical judgement   ADL Overall ADL's : Needs assistance/impaired Eating/Feeding: Sitting;Set up Eating/Feeding Details (indicate cue type and reason): long sitting in bed with pillows to support upright positioning, set up of packets/containers. Able to self feed once set up with additional time to complete Grooming: Minimal assistance;Moderate assistance;Bed level   Upper Body Bathing: Bed level;Maximal assistance   Lower Body Bathing: Total assistance;Bed level   Upper Body Dressing : Bed level;Maximal assistance   Lower Body Dressing: Bed level;Total assistance     Toilet Transfer Details (indicate cue type and reason): unsafe to attempt                 Vision Patient Visual Report: No change from baseline       Perception     Praxis      Pertinent Vitals/Pain Pain Assessment: Faces Faces Pain Scale: Hurts whole lot Pain Location: L elbow, wrist with movement Pain Descriptors /  Indicators: Guarding;Grimacing;Moaning Pain Intervention(s): Limited activity within patient's tolerance;Monitored during session;Repositioned     Hand Dominance Right   Extremity/Trunk Assessment Upper Extremity Assessment Upper Extremity Assessment: Generalized weakness;LUE deficits/detail LUE Deficits / Details: L shoulder grossly 2+/5, elbow and wrist with 1/5 (all limited by pain) LUE: Unable to fully assess due to pain   Lower Extremity Assessment Lower Extremity Assessment: Defer to  PT evaluation;Generalized weakness       Communication Communication Communication: No difficulties   Cognition Arousal/Alertness: Awake/alert Behavior During Therapy: WFL for tasks assessed/performed Overall Cognitive Status: No family/caregiver present to determine baseline cognitive functioning                                 General Comments: Pt alert and oriented to self, generally to place. Pt responses appropriately to questions ~75% of the time, appears mildly confused, very slow processing and task initiation   General Comments  LUE noted to be down by pt's side, slightly tucked under him. LUE repositioned on pillows to improve positioning and increase elevation    Exercises     Shoulder Instructions      Home Living Family/patient expects to be discharged to:: Private residence Living Arrangements: Spouse/significant other Available Help at Discharge: Family Type of Home: House Home Access: Stairs to enter Technical brewer of Steps: 2 Entrance Stairs-Rails: Right Home Layout: One level     Bathroom Shower/Tub: Teacher, early years/pre: Standard                Prior Functioning/Environment Level of Independence: Independent with assistive device(s)        Comments: Mod indep without assist device (intermittent furniture walking) for household distances; [facility] for longer, community distances.  Wife assists with ADLs, household chores as appropriate. Pt reports spouse manages medications.        OT Problem List: Decreased strength;Decreased knowledge of use of DME or AE;Increased edema;Decreased range of motion;Impaired UE functional use;Pain;Impaired balance (sitting and/or standing)      OT Treatment/Interventions: Self-care/ADL training;Balance training;Therapeutic exercise;Therapeutic activities;Splinting;Modalities;DME and/or AE instruction;Patient/family education;Cognitive remediation/compensation;Manual therapy    OT  Goals(Current goals can be found in the care plan section) Acute Rehab OT Goals Patient Stated Goal: to decrease L UE pain OT Goal Formulation: With patient Time For Goal Achievement: 10/11/17 Potential to Achieve Goals: Good ADL Goals Pt Will Perform Upper Body Dressing: with min assist;sitting;with mod assist Pt Will Perform Lower Body Dressing: sitting/lateral leans;with mod assist;with max assist Pt Will Transfer to Toilet: with mod assist;bedside commode;stand pivot transfer(LRAD for amb)  OT Frequency: Min 1X/week   Barriers to D/C:            Co-evaluation              AM-PAC PT "6 Clicks" Daily Activity     Outcome Measure Help from another person eating meals?: A Little Help from another person taking care of personal grooming?: A Little Help from another person toileting, which includes using toliet, bedpan, or urinal?: Total Help from another person bathing (including washing, rinsing, drying)?: A Lot Help from another person to put on and taking off regular upper body clothing?: A Lot Help from another person to put on and taking off regular lower body clothing?: A Lot 6 Click Score: 13   End of Session    Activity Tolerance: Patient limited by pain Patient left: in bed;with call bell/phone within reach;with bed alarm  set  OT Visit Diagnosis: Other abnormalities of gait and mobility (R26.89);Pain Pain - Right/Left: Left Pain - part of body: Arm;Hand                Time: 7092-9574 OT Time Calculation (min): 26 min Charges:  OT General Charges $OT Visit: 1 Visit OT Evaluation $OT Eval Moderate Complexity: 1 Mod OT Treatments $Self Care/Home Management : 8-22 mins  Jeni Salles, MPH, MS, OTR/L ascom [phone] 09/27/17, 9:42 AM

## 2017-09-27 NOTE — Clinical Social Work Note (Signed)
CSW spoke with Magda Paganini at [facility] regarding patient. Magda Paganini states that they can accept patient when ready for discharge. CSW notified patient and wife at bedside of above. Wife states that they will accept the bed offer. CSW will continue to follow for discharge planning.   [place], [place]

## 2017-09-27 NOTE — Clinical Social Work Note (Signed)
CSW spoke with patient's wife [place]. CSW explained that therapy is recommending SNF for rehab. Wife is in agreement with this and states that her preference would be [facility]. CSW explained to wife that in order to go to SNF patient will need to change to hemo dialysis and will have to have another port placed. Wife states that she understands and is in agreement. CSW also explained that patient's dialysis time may change and wife is still in agreement. CSW will reach out to [facility] to see if they can accept patient. CSW will continue to follow for discharge planning.   [place], [place]

## 2017-09-27 NOTE — Progress Notes (Signed)
Post PD assessment. I drain 194, UF -1424, avg dwell 1 hr 42 minutes, lost dwell 31 minutes. Post weight 123kg.    09/27/17 0912  Completion  Weight after Drain 271 lb 2.7 oz (123 kg)  Effluent Appearance Clear;Light;Yellow  Treatment Status Complete  Procedure Comments  Tolerated treatment well? Yes  Peritoneal Dialysis Comments UF -1424  Education / Care Plan  Dialysis Education Provided Yes  Documented Education in Care Plan Yes  Hand-Off documentation  Report given to (Full Name) [facility]  Report received from (Full Name) Stark Bray

## 2017-09-27 NOTE — Progress Notes (Signed)
[facility], [place] 09/27/17  Subjective:   Patient known to our practice from outpatient dialysis He presents for severe elbow and left wrist pain Low-grade fever of 99 Denies any abdominal pain No problems with peritoneal dialysis last night PD fluid cell count neg for infection Continues to have left elbow and wrist pain    Objective:  Vital signs in last 24 hours:  Temp:  [98.2 F (36.8 C)-99 F (37.2 C)] 98.2 F (36.8 C) (09/04 1450) Pulse Rate:  [86-89] 86 (09/04 1450) Resp:  [20] 20 (09/04 1450) BP: (97-124)/(57-74) 97/57 (09/04 1450) SpO2:  [90 %-100 %] 90 % (09/04 1450) Weight:  [107.5 kg] 107.5 kg (09/05 0814)  Weight change:  Filed Weights   09/25/17 1331 09/27/17 0814  Weight: 105.5 kg 107.5 kg    Intake/Output:    Intake/Output Summary (Last 24 hours) at 09/27/2017 1012 Last data filed at 09/26/2017 2133 Gross per 24 hour  Intake 14340.27 ml  Output -  Net 14340.27 ml     Physical Exam: General:  No acute distress, laying in the bed  HEENT  anicteric, moist oral mucous membranes  Neck  supple  Pulm/lungs  lungs are clear to auscultation bilaterally  CVS/Heart  regular rhythm, no rub or gallop  Abdomen:   Soft, nontender  Extremities:  No peripheral edema, Left elbow swelling, left wrist swelling, limiting ROM  Neurologic:  Alert, oriented  Skin:  No acute rashes  Access:  PD catheter, exit site nontender       Basic Metabolic Panel:  Recent Labs  Lab 09/25/17 1337 09/26/17 0819 09/27/17 0432  NA 136 136 133*  K 3.7 3.9 3.6  CL 96* 95* 93*  CO2 27 28 26   GLUCOSE 214* 184* 238*  BUN 54* 63* 61*  CREATININE 5.78* 6.08* 6.00*  CALCIUM 8.5* 8.2* 8.2*     CBC: Recent Labs  Lab 09/25/17 1337 09/26/17 0819 09/27/17 0432  WBC 15.8* 18.6* 16.2*  NEUTROABS 12.8*  --   --   HGB 10.6* 10.1* 10.6*  HCT 31.3* 28.9* 30.8*  MCV 85.9 85.3 85.1  PLT 226 205 218     No results found for: HEPBSAG, HEPBSAB,  HEPBIGM    Microbiology:  Recent Results (from the past 240 hour(s))  Blood culture (routine x 2)     Status: None (Preliminary result)   Collection Time: 09/25/17  1:37 PM  Result Value Ref Range Status   Specimen Description BLOOD RIGHT FA  Final   Special Requests   Final    BOTTLES DRAWN AEROBIC AND ANAEROBIC Blood Culture results may not be optimal due to an excessive volume of blood received in culture bottles   Culture   Final    NO GROWTH 2 DAYS Performed at [facility], [address] [place], [place] [address]    Report Status PENDING  Incomplete  Blood culture (routine x 2)     Status: None (Preliminary result)   Collection Time: 09/25/17  2:51 PM  Result Value Ref Range Status   Specimen Description BLOOD RIGHT ANTECUBITAL  Final   Special Requests   Final    BOTTLES DRAWN AEROBIC AND ANAEROBIC Blood Culture results may not be optimal due to an excessive volume of blood received in culture bottles   Culture  Setup Time PENDING  Incomplete   Culture   Final    NO GROWTH 2 DAYS Performed at [facility], [address], [place], [place] [address]  Report Status PENDING  Incomplete    Coagulation Studies: Recent Labs    09/25/17 1337  LABPROT 14.3  INR 1.12    Urinalysis: No results for input(s): COLORURINE, LABSPEC, PHURINE, GLUCOSEU, HGBUR, BILIRUBINUR, KETONESUR, PROTEINUR, UROBILINOGEN, NITRITE, LEUKOCYTESUR in the last 72 hours.  Invalid input(s): APPERANCEUR    Imaging: Dg Elbow 2 Views Left  Result Date: 09/26/2017 CLINICAL DATA:  Painful left elbow EXAM: LEFT ELBOW - 2 VIEW COMPARISON:  None. FINDINGS: Limited by positioning. No obvious fracture. Possible elbow effusion. Spurring at the olecranon. IMPRESSION: Limited by positioning. No definite acute osseous abnormality. Probable elbow effusion Electronically Signed   By: Donavan Foil M.D.   On: 09/26/2017 20:25   Ct Head Wo Contrast  Result Date: 09/25/2017 CLINICAL DATA:   Altered level of consciousness, unexplained left arm weakness, no injury EXAM: CT HEAD WITHOUT CONTRAST TECHNIQUE: Contiguous axial images were obtained from the base of the skull through the vertex without intravenous contrast. COMPARISON:  None. FINDINGS: Brain: The ventricular system is slightly prominent as are the cortical sulci diffusely, consistent with atrophy. The septum is midline in position. There may be mild small vessel ischemic change in the periventricular white matter. However, no hemorrhage, mass lesion, or acute infarction is seen. Vascular: No vascular abnormality is noted on this unenhanced study. Skull: On bone window images, no calvarial abnormality is seen. Sinuses/Orbits: The paranasal sinuses appear pneumatized. Other: None. IMPRESSION: Diffuse atrophy.  No acute intracranial abnormality. Electronically Signed   By: Ivar Drape M.D.   On: 09/25/2017 13:58   Mr [name] Head Wo Contrast  Result Date: 09/25/2017 CLINICAL DATA:  Left arm weakness and pain. EXAM: MRI HEAD WITHOUT CONTRAST MRA HEAD WITHOUT CONTRAST TECHNIQUE: Multiplanar, multiecho pulse sequences of the brain and surrounding structures were obtained without intravenous contrast. Angiographic images of the head were obtained using MRA technique without contrast. COMPARISON:  Head CT 09/25/2017 FINDINGS: MRI HEAD FINDINGS Brain: There is no evidence of acute infarct, intracranial hemorrhage, mass, midline shift, or extra-axial fluid collection. There is moderate generalized cerebral atrophy. T2 hyperintensities are most notable in the parietal white matter and are nonspecific but compatible with mild chronic small vessel ischemic disease. Vascular: Major intracranial vascular flow voids are preserved. Skull and upper cervical spine: Unremarkable bone marrow signal. Sinuses/Orbits: Unremarkable orbits. Minimal left maxillary sinus mucosal thickening. Clear mastoid air cells. Other: Incidental small lipoma along the posterior margin  of the left parotid gland. MRA HEAD FINDINGS The visualized distal vertebral arteries are patent to the basilar and codominant. Patent right PICA, bilateral AICA, and bilateral SCA origins are identified. The basilar artery is widely patent. PCAs are patent without evidence of significant stenosis. The internal carotid arteries are widely patent from skull base to carotid termini with tortuosity of both distal cervical ICA is noted. ACAs and MCAs are patent without evidence of proximal branch occlusion. There is evidence of a moderate proximal left M1 stenosis, however motion artifact is also noted through this region which may artifactually exaggerate the true degree of narrowing. No aneurysm is identified. IMPRESSION: 1. No acute intracranial abnormality. 2. Mild chronic small vessel ischemic disease. 3. Patent circle of Willis without large vessel occlusion. Moderate left M1 stenosis. Electronically Signed   By: Logan Bores M.D.   On: 09/25/2017 17:18   Mr Brain Wo Contrast  Result Date: 09/25/2017 CLINICAL DATA:  Left arm weakness and pain. EXAM: MRI HEAD WITHOUT CONTRAST MRA HEAD WITHOUT CONTRAST TECHNIQUE: Multiplanar, multiecho pulse sequences of the brain and  surrounding structures were obtained without intravenous contrast. Angiographic images of the head were obtained using MRA technique without contrast. COMPARISON:  Head CT 09/25/2017 FINDINGS: MRI HEAD FINDINGS Brain: There is no evidence of acute infarct, intracranial hemorrhage, mass, midline shift, or extra-axial fluid collection. There is moderate generalized cerebral atrophy. T2 hyperintensities are most notable in the parietal white matter and are nonspecific but compatible with mild chronic small vessel ischemic disease. Vascular: Major intracranial vascular flow voids are preserved. Skull and upper cervical spine: Unremarkable bone marrow signal. Sinuses/Orbits: Unremarkable orbits. Minimal left maxillary sinus mucosal thickening. Clear  mastoid air cells. Other: Incidental small lipoma along the posterior margin of the left parotid gland. MRA HEAD FINDINGS The visualized distal vertebral arteries are patent to the basilar and codominant. Patent right PICA, bilateral AICA, and bilateral SCA origins are identified. The basilar artery is widely patent. PCAs are patent without evidence of significant stenosis. The internal carotid arteries are widely patent from skull base to carotid termini with tortuosity of both distal cervical ICA is noted. ACAs and MCAs are patent without evidence of proximal branch occlusion. There is evidence of a moderate proximal left M1 stenosis, however motion artifact is also noted through this region which may artifactually exaggerate the true degree of narrowing. No aneurysm is identified. IMPRESSION: 1. No acute intracranial abnormality. 2. Mild chronic small vessel ischemic disease. 3. Patent circle of Willis without large vessel occlusion. Moderate left M1 stenosis. Electronically Signed   By: Logan Bores M.D.   On: 09/25/2017 17:18   US Carotid Bilateral (at [facility] Only)  Result Date: 09/25/2017 CLINICAL DATA:  TIA.  Left arm pain and weakness. EXAM: BILATERAL CAROTID DUPLEX ULTRASOUND TECHNIQUE: Pearline Cables scale imaging, color Doppler and duplex ultrasound were performed of bilateral carotid and vertebral arteries in the neck. COMPARISON:  None. FINDINGS: Criteria: Quantification of carotid stenosis is based on velocity parameters that correlate the residual internal carotid diameter with NASCET-based stenosis levels, using the diameter of the distal internal carotid lumen as the denominator for stenosis measurement. The following velocity measurements were obtained: RIGHT ICA: 62/16 cm/sec CCA: 25/42 cm/sec SYSTOLIC ICA/CCA RATIO:  0.8 ECA: 171 cm/sec LEFT ICA: 48/15 cm/sec CCA: 70/62 cm/sec SYSTOLIC ICA/CCA RATIO:  0.6 ECA: 92 cm/sec RIGHT CAROTID ARTERY: Mild atherosclerotic change at the ball RIGHT VERTEBRAL  ARTERY:  Antegrade flow LEFT CAROTID ARTERY:  Mild atherosclerotic change at the ball LEFT VERTEBRAL ARTERY:  Antegrade flow Blood pressure not reported. The distal ICAs could not be evaluated due to high bifurcations bilaterally. IMPRESSION: The distal internal carotid arteries cannot be evaluated on this study due to high bifurcations. Within visualized limits, there are mild atherosclerotic changes of the bulb. No rate limiting stenosis seen in either ICA. Electronically Signed   By: Dorise Bullion III M.D   On: 09/25/2017 18:00   Dg Shoulder Left  Result Date: 09/25/2017 CLINICAL DATA:  Left shoulder and arm pain. EXAM: LEFT SHOULDER - 2+ VIEW COMPARISON:  None. FINDINGS: Mild degenerative changes in the shoulder. No fracture or dislocation. IMPRESSION: Mild degenerative changes. Electronically Signed   By: Dorise Bullion III M.D   On: 09/25/2017 18:02     Medications:   . sodium chloride Stopped (09/26/17 2109)  . cefTRIAXone (ROCEPHIN)  IV Stopped (09/26/17 2109)  . dialysis solution 1.5% low-MG/low-CA     . amLODipine  10 mg Oral Daily  . aspirin  81 mg Oral Daily  . atorvastatin  40 mg Oral Daily  . calcitRIOL  0.25 mcg Oral Daily  . carvedilol  6.25 mg Oral BID WC  . cholecalciferol  1,000 Units Oral Daily  . citalopram  10 mg Oral Daily  . gentamicin cream  1 application Topical Daily  . heparin  5,000 Units Subcutaneous Q8H  . hydrochlorothiazide  25 mg Oral Daily  . insulin aspart  0-5 Units Subcutaneous QHS  . insulin aspart  0-9 Units Subcutaneous TID WC  . irbesartan  300 mg Oral Daily  . sodium bicarbonate  650 mg Oral BID  . vitamin B-12  1,000 mcg Oral Daily   sodium chloride, acetaminophen **OR** acetaminophen (TYLENOL) oral liquid 160 mg/5 mL **OR** acetaminophen, heparin, oxyCODONE-acetaminophen  Assessment/ Plan:  72 y.o. African-American male with COPD, arthritis, end-stage renal disease, depression, sleep apnea, diabetes type 2 with neuropathy, hypertension,   Presents with left elbow and wrist swelling and low-grade fever  Peritoneal dialysis/[facility]/ 107 kg/ 4 x 3000 cc, last fill 2000 cc  1.  End-stage renal disease 2.  Left elbow and left wrist pain and swelling 3.  Anemia of chronic kidney disease 4.  Secondary hyperparathyroidism  We will continue peritoneal dialysis.  With elevated WBC count and low-grade fever, need to rule out infection.  Obtain urinalysis.  PD cell count and culture neg for infection.  MRI of the left elbow has been ordered by primary team.  Uric acid is in normal range and patient has no known history of gout.  Infectious arthritis is suspected.  Consider broadening the coverage to cover MRSA with vancomycin. Blood cultures are negative so far. We will follow closely   LOS: [place] 9/5/201910:12 AM  [place], [place]  Note: This note was prepared with Dragon dictation. Any transcription errors are unintentional

## 2017-09-27 NOTE — Progress Notes (Signed)
[facility] [place] NAME: Joseph Newman    MR#:  [id number]  DATE OF BIRTH:  [birth year]  SUBJECTIVE:   Still with elbow and wrist pain And was unable to get MRI because he was unable to straighten his arm due to pain REVIEW OF SYSTEMS:    Review of Systems  Constitutional: Negative for fever, chills weight loss HENT: Negative for ear pain, nosebleeds, congestion, facial swelling, rhinorrhea, neck pain, neck stiffness and ear discharge.   Respiratory: Negative for cough, shortness of breath, wheezing  Cardiovascular: Negative for chest pain, palpitations and leg swelling.  Gastrointestinal: Negative for heartburn, abdominal pain, vomiting, diarrhea or consitpation Genitourinary: Negative for dysuria, urgency, frequency, hematuria Musculoskeletal: Positive for left elbow/wrist pain   neurological: Negative for dizziness, seizures, syncope, focal weakness,  numbness and headaches.  Hematological: Does not bruise/bleed easily.  Psychiatric/Behavioral: Negative for hallucinations, confusion, dysphoric mood    Tolerating Diet: yes      DRUG ALLERGIES:  No Known Allergies  VITALS:  Blood pressure (!) 97/57, pulse 86, temperature 98.2 F (36.8 C), temperature source Oral, resp. rate 20, height 6\' 5"  (1.956 m), weight 107.5 kg, SpO2 90 %.  PHYSICAL EXAMINATION:  Constitutional: Appears well-developed and well-nourished. No distress. HENT: Normocephalic. Marland Kitchen Oropharynx is clear and moist.  Eyes: Conjunctivae and EOM are normal. PERRLA, no scleral icterus.  Neck: Normal ROM. Neck supple. No JVD. No tracheal deviation. CVS: RRR, S1/S2 +, no murmurs, no gallops, no carotid bruit.  Pulmonary: Effort and breath sounds normal, no stridor, rhonchi, wheezes, rales.  Abdominal: Soft. BS +,  no distension, tenderness, rebound or guarding.  Musculoskeletal: Patient with left elbow tenderness/left wrist and swelling cannot move left arm/elbow positive  joint line tenderness no erythema noted Neuro: Alert. CN 2-12 grossly intact. No focal deficits. Skin: Skin is warm and dry. No rash noted. Psychiatric: Normal mood and affect.      LABORATORY PANEL:   CBC Recent Labs  Lab 09/27/17 0432  WBC 16.2*  HGB 10.6*  HCT 30.8*  PLT 218   ------------------------------------------------------------------------------------------------------------------  Chemistries  Recent Labs  Lab 09/25/17 1337  09/27/17 0432  NA 136   < > 133*  K 3.7   < > 3.6  CL 96*   < > 93*  CO2 27   < > 26  GLUCOSE 214*   < > 238*  BUN 54*   < > 61*  CREATININE 5.78*   < > 6.00*  CALCIUM 8.5*   < > 8.2*  AST 16  --   --   ALT 9  --   --   ALKPHOS 74  --   --   BILITOT 0.6  --   --    < > = values in this interval not displayed.   ------------------------------------------------------------------------------------------------------------------  Cardiac Enzymes Recent Labs  Lab 09/25/17 1337  TROPONINI 0.03*   ------------------------------------------------------------------------------------------------------------------  RADIOLOGY:  Dg Elbow 2 Views Left  Result Date: 09/26/2017 CLINICAL DATA:  Painful left elbow EXAM: LEFT ELBOW - 2 VIEW COMPARISON:  None. FINDINGS: Limited by positioning. No obvious fracture. Possible elbow effusion. Spurring at the olecranon. IMPRESSION: Limited by positioning. No definite acute osseous abnormality. Probable elbow effusion Electronically Signed   By: Donavan Foil M.D.   On: 09/26/2017 20:25   Ct Head Wo Contrast  Result Date: 09/25/2017 CLINICAL DATA:  Altered level of consciousness, unexplained left arm weakness, no injury EXAM: CT HEAD WITHOUT CONTRAST TECHNIQUE: Contiguous axial images were  obtained from the base of the skull through the vertex without intravenous contrast. COMPARISON:  None. FINDINGS: Brain: The ventricular system is slightly prominent as are the cortical sulci diffusely, consistent with  atrophy. The septum is midline in position. There may be mild small vessel ischemic change in the periventricular white matter. However, no hemorrhage, mass lesion, or acute infarction is seen. Vascular: No vascular abnormality is noted on this unenhanced study. Skull: On bone window images, no calvarial abnormality is seen. Sinuses/Orbits: The paranasal sinuses appear pneumatized. Other: None. IMPRESSION: Diffuse atrophy.  No acute intracranial abnormality. Electronically Signed   By: Ivar Drape M.D.   On: 09/25/2017 13:58   Mr [name] Head Wo Contrast  Result Date: 09/25/2017 CLINICAL DATA:  Left arm weakness and pain. EXAM: MRI HEAD WITHOUT CONTRAST MRA HEAD WITHOUT CONTRAST TECHNIQUE: Multiplanar, multiecho pulse sequences of the brain and surrounding structures were obtained without intravenous contrast. Angiographic images of the head were obtained using MRA technique without contrast. COMPARISON:  Head CT 09/25/2017 FINDINGS: MRI HEAD FINDINGS Brain: There is no evidence of acute infarct, intracranial hemorrhage, mass, midline shift, or extra-axial fluid collection. There is moderate generalized cerebral atrophy. T2 hyperintensities are most notable in the parietal white matter and are nonspecific but compatible with mild chronic small vessel ischemic disease. Vascular: Major intracranial vascular flow voids are preserved. Skull and upper cervical spine: Unremarkable bone marrow signal. Sinuses/Orbits: Unremarkable orbits. Minimal left maxillary sinus mucosal thickening. Clear mastoid air cells. Other: Incidental small lipoma along the posterior margin of the left parotid gland. MRA HEAD FINDINGS The visualized distal vertebral arteries are patent to the basilar and codominant. Patent right PICA, bilateral AICA, and bilateral SCA origins are identified. The basilar artery is widely patent. PCAs are patent without evidence of significant stenosis. The internal carotid arteries are widely patent from skull base  to carotid termini with tortuosity of both distal cervical ICA is noted. ACAs and MCAs are patent without evidence of proximal branch occlusion. There is evidence of a moderate proximal left M1 stenosis, however motion artifact is also noted through this region which may artifactually exaggerate the true degree of narrowing. No aneurysm is identified. IMPRESSION: 1. No acute intracranial abnormality. 2. Mild chronic small vessel ischemic disease. 3. Patent circle of Willis without large vessel occlusion. Moderate left M1 stenosis. Electronically Signed   By: Logan Bores M.D.   On: 09/25/2017 17:18   Mr Brain Wo Contrast  Result Date: 09/25/2017 CLINICAL DATA:  Left arm weakness and pain. EXAM: MRI HEAD WITHOUT CONTRAST MRA HEAD WITHOUT CONTRAST TECHNIQUE: Multiplanar, multiecho pulse sequences of the brain and surrounding structures were obtained without intravenous contrast. Angiographic images of the head were obtained using MRA technique without contrast. COMPARISON:  Head CT 09/25/2017 FINDINGS: MRI HEAD FINDINGS Brain: There is no evidence of acute infarct, intracranial hemorrhage, mass, midline shift, or extra-axial fluid collection. There is moderate generalized cerebral atrophy. T2 hyperintensities are most notable in the parietal white matter and are nonspecific but compatible with mild chronic small vessel ischemic disease. Vascular: Major intracranial vascular flow voids are preserved. Skull and upper cervical spine: Unremarkable bone marrow signal. Sinuses/Orbits: Unremarkable orbits. Minimal left maxillary sinus mucosal thickening. Clear mastoid air cells. Other: Incidental small lipoma along the posterior margin of the left parotid gland. MRA HEAD FINDINGS The visualized distal vertebral arteries are patent to the basilar and codominant. Patent right PICA, bilateral AICA, and bilateral SCA origins are identified. The basilar artery is widely patent. PCAs are patent without  evidence of significant  stenosis. The internal carotid arteries are widely patent from skull base to carotid termini with tortuosity of both distal cervical ICA is noted. ACAs and MCAs are patent without evidence of proximal branch occlusion. There is evidence of a moderate proximal left M1 stenosis, however motion artifact is also noted through this region which may artifactually exaggerate the true degree of narrowing. No aneurysm is identified. IMPRESSION: 1. No acute intracranial abnormality. 2. Mild chronic small vessel ischemic disease. 3. Patent circle of Willis without large vessel occlusion. Moderate left M1 stenosis. Electronically Signed   By: Logan Bores M.D.   On: 09/25/2017 17:18   US Carotid Bilateral (at [facility] Only)  Result Date: 09/25/2017 CLINICAL DATA:  TIA.  Left arm pain and weakness. EXAM: BILATERAL CAROTID DUPLEX ULTRASOUND TECHNIQUE: Pearline Cables scale imaging, color Doppler and duplex ultrasound were performed of bilateral carotid and vertebral arteries in the neck. COMPARISON:  None. FINDINGS: Criteria: Quantification of carotid stenosis is based on velocity parameters that correlate the residual internal carotid diameter with NASCET-based stenosis levels, using the diameter of the distal internal carotid lumen as the denominator for stenosis measurement. The following velocity measurements were obtained: RIGHT ICA: 62/16 cm/sec CCA: 67/67 cm/sec SYSTOLIC ICA/CCA RATIO:  0.8 ECA: 171 cm/sec LEFT ICA: 48/15 cm/sec CCA: 20/94 cm/sec SYSTOLIC ICA/CCA RATIO:  0.6 ECA: 92 cm/sec RIGHT CAROTID ARTERY: Mild atherosclerotic change at the ball RIGHT VERTEBRAL ARTERY:  Antegrade flow LEFT CAROTID ARTERY:  Mild atherosclerotic change at the ball LEFT VERTEBRAL ARTERY:  Antegrade flow Blood pressure not reported. The distal ICAs could not be evaluated due to high bifurcations bilaterally. IMPRESSION: The distal internal carotid arteries cannot be evaluated on this study due to high bifurcations. Within visualized limits,  there are mild atherosclerotic changes of the bulb. No rate limiting stenosis seen in either ICA. Electronically Signed   By: Dorise Bullion III M.D   On: 09/25/2017 18:00   Dg Shoulder Left  Result Date: 09/25/2017 CLINICAL DATA:  Left shoulder and arm pain. EXAM: LEFT SHOULDER - 2+ VIEW COMPARISON:  None. FINDINGS: Mild degenerative changes in the shoulder. No fracture or dislocation. IMPRESSION: Mild degenerative changes. Electronically Signed   By: Dorise Bullion III M.D   On: 09/25/2017 18:02     ASSESSMENT AND PLAN:   72 year old male with history of end-stage renal disease on peritoneal dialysis,essential hypertension and diabetes who presented to the emergency room due to left arm weakness and pain.  1.  Sepsis with left elbow pain: Patient presents with leukocytosis and tachypnea.   Sepsis may be due to to bursitis/septic joint She will reattempt MRI today Dr. Sabra Heck to see patient today Continue Rocephin for now CVA work-up was negative  2.  End-stage renal disease on peritoneal dialysis: Nephrology consultation appreciated  3.  Diabetes: Continue outpatient regimen with ADA diet  4.  Essential hypertension: Continue Norvasc, Coreg, HCTZ  5.  Depression: Continue Celexa  6. Tobacco dependence: Patient is encouraged to quit smoking. Counseling was provided for 4 minutes.   Management plans discussed with the patient and he is in agreement.  CODE STATUS: FULL  TOTAL TIME TAKING CARE OF THIS PATIENT: 22 minutes.     POSSIBLE D/C 2-4 days, DEPENDING ON CLINICAL CONDITION.   Arely Tinner M.D on 09/27/2017 at 11:26 AM  Between 7am to 6pm - Pager - [phone] After 6pm go to [email or web address] - password [email or web address] [facility]  Office  [phone]  CC: Primary care  physician; Steele Sizer, MD  Note: This dictation was prepared with Dragon dictation along with smaller phrase technology. Any transcriptional errors that result from this process are  unintentional.

## 2017-09-28 ENCOUNTER — Encounter
Admission: EM | Disposition: A | Discharge status: Skilled Nursing Facility | Payer: Self-pay | Source: Home / Self Care | Attending: Internal Medicine

## 2017-09-28 ENCOUNTER — Inpatient Hospital Stay: Payer: Medicare Other

## 2017-09-28 DIAGNOSIS — N186 End stage renal disease: Secondary | ICD-10-CM

## 2017-09-28 HISTORY — PX: DIALYSIS/PERMA CATHETER INSERTION: CATH118288

## 2017-09-28 LAB — BASIC METABOLIC PANEL
Anion gap: 16 — ABNORMAL HIGH (ref 5–15)
BUN: 54 mg/dL — ABNORMAL HIGH (ref 8–23)
CALCIUM: 8.2 mg/dL — AB (ref 8.9–10.3)
CO2: 25 mmol/L (ref 22–32)
Chloride: 92 mmol/L — ABNORMAL LOW (ref 98–111)
Creatinine, Ser: 5.27 mg/dL — ABNORMAL HIGH (ref 0.61–1.24)
GFR calc Af Amer: 11 mL/min — ABNORMAL LOW (ref >60)
GFR calc non Af Amer: 10 mL/min — ABNORMAL LOW (ref >60)
Glucose, Bld: 336 mg/dL — ABNORMAL HIGH (ref 70–99)
POTASSIUM: 3.4 mmol/L — AB (ref 3.5–5.1)
SODIUM: 133 mmol/L — AB (ref 135–145)

## 2017-09-28 LAB — URINE DRUG SCREEN, QUALITATIVE (ARMC ONLY)
AMPHETAMINES, UR SCREEN: NOT DETECTED
Barbiturates, Ur Screen: NOT DETECTED
Benzodiazepine, Ur Scrn: NOT DETECTED
Cannabinoid 50 Ng, Ur ~~LOC~~: NOT DETECTED
Cocaine Metabolite,Ur ~~LOC~~: NOT DETECTED
MDMA (ECSTASY) UR SCREEN: NOT DETECTED
Methadone Scn, Ur: NOT DETECTED
OPIATE, UR SCREEN: NOT DETECTED
Phencyclidine (PCP) Ur S: NOT DETECTED
Tricyclic, Ur Screen: NOT DETECTED

## 2017-09-28 LAB — GLUCOSE, CAPILLARY
GLUCOSE-CAPILLARY: 226 mg/dL — AB (ref 70–99)
GLUCOSE-CAPILLARY: 260 mg/dL — AB (ref 70–99)
GLUCOSE-CAPILLARY: 298 mg/dL — AB (ref 70–99)
Glucose-Capillary: 326 mg/dL — ABNORMAL HIGH (ref 70–99)
Glucose-Capillary: 243 mg/dL — ABNORMAL HIGH (ref 70–99)

## 2017-09-28 LAB — URINALYSIS, ROUTINE W REFLEX MICROSCOPIC
Bilirubin Urine: NEGATIVE
KETONES UR: NEGATIVE mg/dL
Nitrite: NEGATIVE
PROTEIN: 100 mg/dL — AB
Specific Gravity, Urine: 1.018 (ref 1.005–1.030)
pH: 6 (ref 5.0–8.0)

## 2017-09-28 LAB — CBC
HCT: 30.5 % — ABNORMAL LOW (ref 40.0–52.0)
Hemoglobin: 10.4 g/dL — ABNORMAL LOW (ref 13.0–18.0)
MCH: 29.4 pg (ref 26.0–34.0)
MCHC: 34.1 g/dL (ref 32.0–36.0)
MCV: 86.1 fL (ref 80.0–100.0)
PLATELETS: 232 10*3/uL (ref 150–440)
RBC: 3.55 MIL/uL — AB (ref 4.40–5.90)
RDW: 15 % — ABNORMAL HIGH (ref 11.5–14.5)
WBC: 12.3 10*3/uL — AB (ref 3.8–10.6)

## 2017-09-28 LAB — HEPATITIS B SURFACE ANTIGEN: Hepatitis B Surface Ag: NEGATIVE

## 2017-09-28 SURGERY — DIALYSIS/PERMA CATHETER INSERTION
Anesthesia: Moderate Sedation

## 2017-09-28 MED ORDER — HEPARIN SODIUM (PORCINE) 1000 UNIT/ML DIALYSIS
20.0000 [IU]/kg | INTRAMUSCULAR | Status: DC | PRN
  Filled 2017-09-28: qty 3

## 2017-09-28 MED ORDER — PREDNISONE 50 MG PO TABS
50.0000 mg | ORAL_TABLET | Freq: Every day | ORAL | Status: AC
  Administered 2017-09-28: 12:00:00 50 mg via ORAL
  Filled 2017-09-28: qty 1

## 2017-09-28 MED ORDER — MIDAZOLAM HCL 2 MG/2ML IJ SOLN
INTRAMUSCULAR | Status: DC | PRN
  Administered 2017-09-28: 1 mg via INTRAVENOUS

## 2017-09-28 MED ORDER — ALLOPURINOL 100 MG PO TABS
50.0000 mg | ORAL_TABLET | ORAL | Status: DC
  Administered 2017-09-28: 50 mg via ORAL
  Filled 2017-09-28: qty 0.5

## 2017-09-28 MED ORDER — PREDNISONE 10 MG PO TABS: 10.0000 mg | ORAL_TABLET | Freq: Every day | ORAL | Status: DC

## 2017-09-28 MED ORDER — PREDNISONE 20 MG PO TABS: 20.0000 mg | ORAL_TABLET | Freq: Every day | ORAL | Status: DC

## 2017-09-28 MED ORDER — CHLORHEXIDINE GLUCONATE CLOTH 2 % EX PADS
6.0000 | MEDICATED_PAD | Freq: Every day | CUTANEOUS | Status: DC
  Administered 2017-09-29: 6 via TOPICAL

## 2017-09-28 MED ORDER — PREDNISONE 20 MG PO TABS: 40.0000 mg | ORAL_TABLET | Freq: Every day | ORAL | Status: DC

## 2017-09-28 MED ORDER — FENTANYL CITRATE (PF) 100 MCG/2ML IJ SOLN
INTRAMUSCULAR | Status: DC | PRN
  Administered 2017-09-28: 25 ug via INTRAVENOUS

## 2017-09-28 MED ORDER — HEPARIN SODIUM (PORCINE) 10000 UNIT/ML IJ SOLN
INTRAMUSCULAR | Status: AC
  Filled 2017-09-28: qty 1

## 2017-09-28 MED ORDER — MIDAZOLAM HCL 2 MG/2ML IJ SOLN
INTRAMUSCULAR | Status: AC
  Filled 2017-09-28: qty 2

## 2017-09-28 MED ORDER — PREDNISONE 20 MG PO TABS: 30.0000 mg | ORAL_TABLET | Freq: Every day | ORAL | Status: DC

## 2017-09-28 MED ORDER — PREDNISONE 20 MG PO TABS
40.0000 mg | ORAL_TABLET | Freq: Every day | ORAL | Status: AC
  Administered 2017-09-29: 08:00:00 40 mg via ORAL
  Filled 2017-09-28: qty 2

## 2017-09-28 MED ORDER — PREDNISONE 50 MG PO TABS: 50.0000 mg | ORAL_TABLET | Freq: Every day | ORAL | Status: DC

## 2017-09-28 MED ORDER — FENTANYL CITRATE (PF) 100 MCG/2ML IJ SOLN
INTRAMUSCULAR | Status: AC
  Filled 2017-09-28: qty 2

## 2017-09-28 MED ORDER — HEPARIN (PORCINE) IN NACL 1000-0.9 UT/500ML-% IV SOLN
INTRAVENOUS | Status: AC
  Filled 2017-09-28: qty 500

## 2017-09-28 MED ORDER — CEFAZOLIN SODIUM-DEXTROSE 1-4 GM/50ML-% IV SOLN
1.0000 g | INTRAVENOUS | Status: DC
  Filled 2017-09-28: qty 50

## 2017-09-28 SURGICAL SUPPLY — 7 items
CATH PALINDROME RT-P 15FX19CM (CATHETERS) ×3 IMPLANT
DERMABOND ADVANCED (GAUZE/BANDAGES/DRESSINGS) ×2
DERMABOND ADVANCED .7 DNX12 (GAUZE/BANDAGES/DRESSINGS) ×1 IMPLANT
PACK ANGIOGRAPHY (CUSTOM PROCEDURE TRAY) ×3 IMPLANT
SUT MNCRL AB 4-0 PS2 18 (SUTURE) ×3 IMPLANT
SUT SILK 0 FSL (SUTURE) ×3 IMPLANT
TOWEL OR 17X26 4PK STRL BLUE (TOWEL DISPOSABLE) ×3 IMPLANT

## 2017-09-28 NOTE — Progress Notes (Signed)
[facility], [place] 09/28/17  Subjective:   Patient known to our practice from outpatient dialysis He presents for severe elbow and left wrist pain He was evaluated by rheumatologist Dr. Jefm Bryant and left knee joint aspiration was performed Results show intracellular and extracellular monosodium urate crystals consistent with acute gout He states that his pain is a little better today Until yesterday, he was able to eat some, no nausea or vomiting Patient is n.p.o. today  Objective:  Vital signs in last 24 hours:  Temp:  [98.1 F (36.7 C)-98.7 F (37.1 C)] 98.3 F (36.8 C) (09/06 1249) Pulse Rate:  [75-90] 75 (09/06 1249) Resp:  [17-20] 20 (09/06 1249) BP: (120-142)/(40-85) 126/71 (09/06 1249) SpO2:  [86 %-98 %] 93 % (09/06 1249)  Weight change:  Filed Weights   09/25/17 1331 09/27/17 0814  Weight: 105.5 kg 107.5 kg    Intake/Output:    Intake/Output Summary (Last 24 hours) at 09/28/2017 1310 Last data filed at 09/28/2017 1031 Gross per 24 hour  Intake 14240 ml  Output 800 ml  Net 13440 ml     Physical Exam: General:  No acute distress, laying in the bed  HEENT  anicteric, moist oral mucous membranes  Neck  supple  Pulm/lungs  lungs are clear to auscultation bilaterally  CVS/Heart  regular rhythm, no rub or gallop  Abdomen:   Soft, nontender  Extremities:  Left elbow swelling, left wrist swelling, limiting ROM  Neurologic:  Alert, oriented  Skin:  No acute rashes  Access:  PD catheter, exit site nontender       Basic Metabolic Panel:  Recent Labs  Lab 09/25/17 1337 09/26/17 0819 09/27/17 0432 09/28/17 0415  NA 136 136 133* 133*  K 3.7 3.9 3.6 3.4*  CL 96* 95* 93* 92*  CO2 27 28 26 25   GLUCOSE 214* 184* 238* 336*  BUN 54* 63* 61* 54*  CREATININE 5.78* 6.08* 6.00* 5.27*  CALCIUM 8.5* 8.2* 8.2* 8.2*     CBC: Recent Labs  Lab 09/25/17 1337 09/26/17 0819 09/27/17 0432 09/28/17 0415  WBC 15.8* 18.6* 16.2* 12.3*   NEUTROABS 12.8*  --   --   --   HGB 10.6* 10.1* 10.6* 10.4*  HCT 31.3* 28.9* 30.8* 30.5*  MCV 85.9 85.3 85.1 86.1  PLT 226 205 218 232      Lab Results  Component Value Date   HEPBSAG Negative 09/27/2017      Microbiology:  Recent Results (from the past 240 hour(s))  Blood culture (routine x 2)     Status: None (Preliminary result)   Collection Time: 09/25/17  1:37 PM  Result Value Ref Range Status   Specimen Description BLOOD RIGHT FA  Final   Special Requests   Final    BOTTLES DRAWN AEROBIC AND ANAEROBIC Blood Culture results may not be optimal due to an excessive volume of blood received in culture bottles   Culture   Final    NO GROWTH 3 DAYS Performed at [facility], [address] [place], [place] [address]    Report Status PENDING  Incomplete  Blood culture (routine x 2)     Status: None (Preliminary result)   Collection Time: 09/25/17  2:51 PM  Result Value Ref Range Status   Specimen Description BLOOD RIGHT ANTECUBITAL  Final   Special Requests   Final    BOTTLES DRAWN AEROBIC AND ANAEROBIC Blood Culture results may not be optimal due to an excessive volume of blood received in culture  bottles   Culture  Setup Time PENDING  Incomplete   Culture   Final    NO GROWTH 3 DAYS Performed at [facility], [address] [place], [place] [address]    Report Status PENDING  Incomplete  Body fluid culture     Status: None (Preliminary result)   Collection Time: 09/26/17  4:54 PM  Result Value Ref Range Status   Specimen Description   Final    PERITONEAL Performed at [facility], [address], [place], [place] [address]    Special Requests   Final    NONE Performed at [facility], [address] [place], [place] [address]    Gram Stain   Final    WBC PRESENT, PREDOMINANTLY MONONUCLEAR NO ORGANISMS SEEN CYTOSPIN SMEAR    Culture   Final    NO GROWTH 2 DAYS Performed at [facility], [place] [address]  [place], [place] [address]    Report Status PENDING  Incomplete    Coagulation Studies: Recent Labs    09/25/17 1337  LABPROT 14.3  INR 1.12    Urinalysis: Recent Labs    09/27/17 2025  COLORURINE YELLOW*  LABSPEC 1.018  PHURINE 6.0  GLUCOSEU >=500*  HGBUR SMALL*  BILIRUBINUR NEGATIVE  KETONESUR NEGATIVE  PROTEINUR 100*  NITRITE NEGATIVE  LEUKOCYTESUR MODERATE*      Imaging: Dg Elbow 2 Views Left  Result Date: 09/26/2017 CLINICAL DATA:  Painful left elbow EXAM: LEFT ELBOW - 2 VIEW COMPARISON:  None. FINDINGS: Limited by positioning. No obvious fracture. Possible elbow effusion. Spurring at the olecranon. IMPRESSION: Limited by positioning. No definite acute osseous abnormality. Probable elbow effusion Electronically Signed   By: Donavan Foil M.D.   On: 09/26/2017 20:25     Medications:   . sodium chloride Stopped (09/26/17 2109)  . dialysis solution 1.5% low-MG/low-CA     . allopurinol  50 mg Oral QODAY  . amLODipine  10 mg Oral Daily  . aspirin  81 mg Oral Daily  . atorvastatin  40 mg Oral Daily  . calcitRIOL  0.25 mcg Oral Daily  . carvedilol  6.25 mg Oral BID WC  . cholecalciferol  1,000 Units Oral Daily  . citalopram  10 mg Oral Daily  . gentamicin cream  1 application Topical Daily  . heparin  5,000 Units Subcutaneous Q8H  . hydrochlorothiazide  25 mg Oral Daily  . insulin aspart  0-5 Units Subcutaneous QHS  . insulin aspart  0-9 Units Subcutaneous TID WC  . insulin detemir  10 Units Subcutaneous Daily  . irbesartan  300 mg Oral Daily  . [START ON 09/29/2017] predniSONE  40 mg Oral Q breakfast   Followed by  . [START ON 09/30/2017] predniSONE  30 mg Oral Q breakfast   Followed by  . [START ON 10/01/2017] predniSONE  20 mg Oral Q breakfast   Followed by  . [START ON 10/02/2017] predniSONE  10 mg Oral Q breakfast  . sodium bicarbonate  650 mg Oral BID  . tuberculin  5 Units Intradermal Once  . vitamin B-12  1,000 mcg Oral Daily   sodium chloride,  acetaminophen **OR** acetaminophen (TYLENOL) oral liquid 160 mg/5 mL **OR** acetaminophen, heparin, HYDROmorphone (DILAUDID) injection, oxyCODONE-acetaminophen  Assessment/ Plan:  72 y.o. African-American male with COPD, arthritis, end-stage renal disease, depression, sleep apnea, diabetes type 2 with neuropathy, hypertension,  Presents with left elbow and wrist swelling and low-grade fever and is found to have acute Gout  Peritoneal dialysis/[facility]  [place]/ 107 kg/ 4 x 3000 cc, last fill 2000 cc  1.  End-stage renal disease 2.  Left elbow and left wrist pain and swelling- Acute Gout 3.  Anemia of chronic kidney disease 4.  Secondary hyperparathyroidism  Patient is deconditioning therefore plans are for him to go to short-term rehab In order to do that, patient has to be converted into hemodialysis Hemodialysis orders were prepared and sent to the discharge planning team Patient will need to be able to sit in the chair for duration of dialysis.  We will plan to do that tomorrow No PD tonight PPD to be read tomorrow   LOS: [place] 9/6/20191:10 PM  [place], [place]  Note: This note was prepared with Dragon dictation. Any transcription errors are unintentional

## 2017-09-28 NOTE — Progress Notes (Signed)
Physical Therapy Treatment Patient Details Name: Joseph Newman MRN: [id number] DOB: 1945/06/21 Today's Date: 09/28/2017    History of Present Illness 72yo male pt presented to ER secondary to weakness, inability to use L UE; initially worked up for TIA/CVA (all imaging negative).  Now pending further orthopedic evaluation of L UE, diagnosed with gout flare to L UE (starting steroid taper per notes)    PT Comments    Patient continues to be very protective of L UE, but noted with marked improvement in pain control and functional use/positioning of extremity.  Able to complete and assess OOB mobility, requiring heavy mod assist +2 ( with elevated bed height) to complete sit/stand and lateral stepping with RW.  Poor standing balance; noted difficulty with motor planning and sequencing of extremity movement at times.  Will continue to progress gait/mobility as appropriate. Continue to recommend STR at discharge, as patient currently requiring constant +2 assist (unsafe/unable for wife to manage indep).    Follow Up Recommendations  SNF     Equipment Recommendations       Recommendations for Other Services       Precautions / Restrictions Precautions Precautions: Fall Precaution Comments: PD catheter Restrictions Weight Bearing Restrictions: No    Mobility  Bed Mobility Overal bed mobility: Needs Assistance Bed Mobility: Supine to Sit;Sit to Supine     Supine to sit: Mod assist Sit to supine: Max assist;+2 for physical assistance   General bed mobility comments: hand-over-hand to guide extremities/initiate movement  Transfers Overall transfer level: Needs assistance Equipment used: Rolling walker (2 wheeled) Transfers: Sit to/from Stand Sit to Stand: Mod assist;+2 physical assistance         General transfer comment: hand-over-hand assist for UE/LE placement, movement sequencing adn initiation.  Optimal performance with wife at side/in visual field (responds very well to  her)  Ambulation/Gait Ambulation/Gait assistance: Mod assist;+2 physical assistance Gait Distance (Feet): 3 Feet         General Gait Details: lateral stepping towards head of bed; hand-over-hand assist for walker position/management and standing balance.  Tends to list forward with fatigue, divided attention   Stairs             Wheelchair Mobility    Modified Rankin (Stroke Patients Only)       Balance Overall balance assessment: Needs assistance Sitting-balance support: No upper extremity supported;Feet supported Sitting balance-Leahy Scale: Fair     Standing balance support: Bilateral upper extremity supported Standing balance-Leahy Scale: Poor                              Cognition Arousal/Alertness: Awake/alert Behavior During Therapy: WFL for tasks assessed/performed Overall Cognitive Status: Impaired/Different from baseline                                 General Comments: delayed processing and task initiation; impaired motor planning, task sequencing and problem solving      Exercises Other Exercises Other Exercises: Sit/stand x3 with RW, mod assist +2 (with wife at side for visual/verbal cuing)-bed elevated due to patient height.  Significant difficulty problem-solving functional use/activation of bilat LEs at times    General Comments        Pertinent Vitals/Pain Pain Assessment: Faces Faces Pain Scale: Hurts little more Pain Location: L elbow, wrist with movement Pain Descriptors / Indicators: Aching;Grimacing;Guarding Pain Intervention(s): Limited activity within patient's tolerance;Monitored during  session;Repositioned    Home Living                      Prior Function            PT Goals (current goals can now be found in the care plan section) Acute Rehab PT Goals Patient Stated Goal: to decrease L UE pain PT Goal Formulation: With patient Time For Goal Achievement: 10/10/17 Potential to  Achieve Goals: Good Progress towards PT goals: Progressing toward goals    Frequency    Min 2X/week      PT Plan Current plan remains appropriate    Co-evaluation              AM-PAC PT "6 Clicks" Daily Activity  Outcome Measure  Difficulty turning over in bed (including adjusting bedclothes, sheets and blankets)?: Unable Difficulty moving from lying on back to sitting on the side of the bed? : Unable Difficulty sitting down on and standing up from a chair with arms (e.g., wheelchair, bedside commode, etc,.)?: Unable Help needed moving to and from a bed to chair (including a wheelchair)?: A Lot Help needed walking in hospital room?: A Lot Help needed climbing 3-5 steps with a railing? : Total 6 Click Score: 8    End of Session Equipment Utilized During Treatment: Gait belt Activity Tolerance: Patient tolerated treatment well Patient left: in bed;with call bell/phone within reach;with bed alarm set Nurse Communication: Mobility status PT Visit Diagnosis: Muscle weakness (generalized) (M62.81);Difficulty in walking, not elsewhere classified (R26.2);Pain Pain - Right/Left: Left Pain - part of body: Arm     Time: 6734-1937 PT Time Calculation (min) (ACUTE ONLY): 29 min  Charges:  $Therapeutic Activity: 23-37 mins                     Sheri Prows H. Owens Shark, PT, DPT, NCS 09/28/17, 3:22 PM [phone]

## 2017-09-28 NOTE — Progress Notes (Signed)
Dr Benjie Karvonen made aware that pt o2 sat dropped to 86% on room air, recheck on room air between 88-91%, pt with no distress noted,  Acknowledged, in room at bedside, no new orders

## 2017-09-28 NOTE — Progress Notes (Signed)
Pd started 

## 2017-09-28 NOTE — Progress Notes (Signed)
[facility] NAME: Teaghan Formica    MR#:  [id number]  DATE OF BIRTH:  29-Apr-1945  SUBJECTIVE:   Elbow wrist pain is much improved. Knee pain improved.  He is status post aspiration of left knee by Dr. Jefm Bryant   REVIEW OF SYSTEMS:    Review of Systems  Constitutional: Negative for fever, chills weight loss HENT: Negative for ear pain, nosebleeds, congestion, facial swelling, rhinorrhea, neck pain, neck stiffness and ear discharge.   Respiratory: Negative for cough, shortness of breath, wheezing  Cardiovascular: Negative for chest pain, palpitations and leg swelling.  Gastrointestinal: Negative for heartburn, abdominal pain, vomiting, diarrhea or consitpation Genitourinary: Negative for dysuria, urgency, frequency, hematuria Musculoskeletal: Positive for left elbow/wrist pain however improved Knee pain improved  neurological: Negative for dizziness, seizures, syncope, focal weakness,  numbness and headaches.  Hematological: Does not bruise/bleed easily.  Psychiatric/Behavioral: Negative for hallucinations, confusion, dysphoric mood    Tolerating Diet: yes      DRUG ALLERGIES:  No Known Allergies  VITALS:  Blood pressure (!) 142/70, pulse 76, temperature 98.1 F (36.7 C), temperature source Oral, resp. rate 20, height 6\' 5"  (1.956 m), weight 107.5 kg, SpO2 91 %.  PHYSICAL EXAMINATION:  Constitutional: Appears well-developed and well-nourished. No distress. HENT: Normocephalic. Marland Kitchen Oropharynx is clear and moist.  Eyes: Conjunctivae and EOM are normal. PERRLA, no scleral icterus.  Neck: Normal ROM. Neck supple. No JVD. No tracheal deviation. CVS: RRR, S1/S2 +, no murmurs, no gallops, no carotid bruit.  Pulmonary: Effort and breath sounds normal, no stridor, rhonchi, wheezes, rales.  Abdominal: Soft. BS +,  no distension, tenderness, rebound or guarding.  Musculoskeletal: Right wrist with synovitis in left wrist with synovitis  and pain on extension Left elbow with pain however much improved Left knee effusion has improved  Neuro: Alert. CN 2-12 grossly intact. No focal deficits. Skin: Skin is warm and dry. No rash noted. Psychiatric: Normal mood and affect.      LABORATORY PANEL:   CBC Recent Labs  Lab 09/28/17 0415  WBC 12.3*  HGB 10.4*  HCT 30.5*  PLT 232   ------------------------------------------------------------------------------------------------------------------  Chemistries  Recent Labs  Lab 09/25/17 1337  09/28/17 0415  NA 136   < > 133*  K 3.7   < > 3.4*  CL 96*   < > 92*  CO2 27   < > 25  GLUCOSE 214*   < > 336*  BUN 54*   < > 54*  CREATININE 5.78*   < > 5.27*  CALCIUM 8.5*   < > 8.2*  AST 16  --   --   ALT 9  --   --   ALKPHOS 74  --   --   BILITOT 0.6  --   --    < > = values in this interval not displayed.   ------------------------------------------------------------------------------------------------------------------  Cardiac Enzymes Recent Labs  Lab 09/25/17 1337  TROPONINI 0.03*   ------------------------------------------------------------------------------------------------------------------  RADIOLOGY:  Dg Elbow 2 Views Left  Result Date: 09/26/2017 CLINICAL DATA:  Painful left elbow EXAM: LEFT ELBOW - 2 VIEW COMPARISON:  None. FINDINGS: Limited by positioning. No obvious fracture. Possible elbow effusion. Spurring at the olecranon. IMPRESSION: Limited by positioning. No definite acute osseous abnormality. Probable elbow effusion Electronically Signed   By: Donavan Foil M.D.   On: 09/26/2017 20:25     ASSESSMENT AND PLAN:   72 year old male with history of end-stage renal disease on peritoneal dialysis,essential hypertension and  diabetes who presented to the emergency room due to left arm weakness and pain.  1.  Presumed Sepsis with polyarticular joint pain patient presented  with leukocytosis and tachypnea.   He has now ruled out for  sepsis. Etiology of leukocytosis and tachypnea were due to pain from gout flare Patient has polyarticular gout As per Dr. Jefm Bryant recommend prednisone taper over the next 6 days Begin allopurinol low-dose 50 mg every other day Patient will need outpatient follow-up with Dr. Jefm Bryant and will need to have follow-up uric acid Goal would be less than 6 for uric acid. Discontinue Rocephin.  2.  End-stage renal disease on peritoneal dialysis: Nephrology consultation appreciated Patient is planned for permacath today  3.  Diabetes: Continue sliding scale and Levemir with ADA diet  4.  Essential hypertension: Continue Norvasc, Coreg, HCTZ, Avapro  5.  Depression: Continue Celexa  6. Tobacco dependence: Patient is encouraged to quit smoking. Counseling was provided.   7.  Acute hypoxic respiratory failure: Check x-ray and start incentive spirometer. 8.  Hyperlipidemia: Continue statin  Management discussed with Dr. Candiss Norse and Dr. Jefm Bryant    Management plans discussed with the patient and he is in agreement.  CODE STATUS: FULL  TOTAL TIME TAKING CARE OF THIS PATIENT: 22 minutes.     POSSIBLE D/C tomorrow if PermCath placed to skilled nursing facility.,   Allia Wiltsey M.D on 09/28/2017 at 9:38 AM  Between 7am to 6pm - Pager - [phone] After 6pm go to [email or web address] - password [email or web address] [facility]  Office  [phone]  CC: Primary care physician; Steele Sizer, MD  Note: This dictation was prepared with Dragon dictation along with smaller phrase technology. Any transcriptional errors that result from this process are unintentional.

## 2017-09-28 NOTE — Progress Notes (Signed)
IS education complete. Pt and family understands technique and reason for use. Pt inspire 2226ml. Pt independent with use

## 2017-09-28 NOTE — Care Management Important Message (Signed)
Important Message  Patient Details  Name: Joseph Newman MRN: [id number] Date of Birth: [birth year]   Medicare Important Message Given:  Yes    Juliann Pulse A Aishwarya Shiplett 09/28/2017, 10:54 AM

## 2017-09-28 NOTE — Progress Notes (Signed)
PD completed

## 2017-09-28 NOTE — Consult Note (Signed)
Rheumatology follow-up Less pain.  Distal exam confirms monosodium urate crystals.  Uric acid 7.0  Exam: Afebrile.  Less swelling in her wrist and elbow and left knee.  Able to move elbow some.  More alert.  Impression: Polyarticular gout End-stage renal disease Diabetes  Recommend prednisone taper over the next 5 or 6 days.  Watch sugar Begin allopurinol low dose.  50 mg every other day Will need follow-up uric acid as an outpatient.  Goal would be to get level below 6. happy to see in follow-up as outpatient if needed

## 2017-09-28 NOTE — Op Note (Signed)
[facility]   OPERATIVE NOTE     PROCEDURE: 1. Insertion of a right IJ tunneled dialysis catheter. 2. Catheter placement and cannulation under ultrasound and fluoroscopic guidance  PRE-OPERATIVE DIAGNOSIS: end-stage renal requiring hemodialysis  POST-OPERATIVE DIAGNOSIS: same as above  SURGEON: Hortencia Pilar  ANESTHESIA: Conscious sedation was administered under my direct supervision by the interventional radiology RN. IV Versed plus fentanyl were utilized. Continuous ECG, pulse oximetry and blood pressure was monitored throughout the entire procedure.  Conscious sedation was for a total of 30 minutes.  ESTIMATED BLOOD LOSS: Minimal  FINDING(S): 1.  Tips of the catheter in the right atrium on fluoroscopy 2.  No obvious pneumothorax on fluoroscopy  SPECIMEN(S):  none  INDICATIONS:   Joseph Newman is a 72 y.o. male  presents with end stage renal disease.  Therefore, the patient requires a tunneled dialysis catheter placement.  The patient is informed of  the risks catheter placement include but are not limited to: bleeding, infection, central venous injury, pneumothorax, possible venous stenosis, possible malpositioning in the venous system, and possible infections related to long-term catheter presence.  The patient was aware of these risks and agreed to proceed.  DESCRIPTION: The patient was taken back to Special Procedure suite.  Prior to sedation, the patient was given IV antibiotics.  After obtaining adequate sedation, the patient was prepped and draped in the standard fashion for a chest or neck tunneled dialysis catheter placement.  Appropriate Time Out is called.     The the right neck and chest wall are then infiltrated with 1% Lidocaine with epinepherine.  A 19 cm tip to cuff palindrome catheter is then selected, opened on the back table and prepped. Ultrasound is placed in a sterile sleeve.  Under ultrasound guidance, the right internal jugular vein is  examined and is noted to be echolucent and easily compressible indicating patency.   An image is recorded for the permanent record.  The right internal jugular vein is cannulated with the microneedle under direct ultrasound vissualization.  A Microwire followed by a micro sheath is then inserted without difficulty.   A J-wire was then advanced under fluoroscopic guidance into the inferior vena cava and the wire was secured.  Small counter incision was then made at the wire insertion site. A small pocket was fashioned with blunt dissection to allow easier passage of the cuff.  The dilator and peel-away sheath are then advanced over the wire under fluoroscopic guidance. The catheters and advanced through the peel-away sheath. It is approximated to the chest wall after verifying the tips at the atrial caval junction and an exit site is selected.  Small incision is made at the selected exit site and the tunneling device was passed subcutaneously to the neck counter incision. Catheter is then pulled through the subcutaneous tunnel. The catheter is then verified for tip position under fluoroscopy, transected and the hub assembly connected.    Each port was tested by aspirating and flushing.  No resistance was noted.  Each port was then thoroughly flushed with heparinized saline.  The catheter was secured in placed with two interrupted stitches of 0 silk tied to the catheter.  The neck incision was closed with a U-stitch of 4-0 Monocryl.  The neck and chest incision were cleaned and sterile bandages applied including a Biopatch.  Each port was then packed with concentrated heparin (1000 Units/mL) at the manufacturer recommended volumes to each port.  Sterile caps were applied to each port.  On completion  fluoroscopy, the tips of the catheter were in the right atrium, and there was no evidence of pneumothorax.  COMPLICATIONS: None  CONDITION: Unchanged   Hortencia Pilar [facility] Office:  [phone]   09/28/2017, 5:24 PM

## 2017-09-29 LAB — HEMOGLOBIN A1C
Hgb A1c MFr Bld: 7.3 % — ABNORMAL HIGH (ref 4.8–5.6)
Mean Plasma Glucose: 162.81 mg/dL

## 2017-09-29 LAB — GLUCOSE, CAPILLARY: Glucose-Capillary: 394 mg/dL — ABNORMAL HIGH (ref 70–99)

## 2017-09-29 LAB — BASIC METABOLIC PANEL
Anion gap: 12 (ref 5–15)
BUN: 54 mg/dL — AB (ref 8–23)
CO2: 28 mmol/L (ref 22–32)
Calcium: 8.4 mg/dL — ABNORMAL LOW (ref 8.9–10.3)
Chloride: 92 mmol/L — ABNORMAL LOW (ref 98–111)
Creatinine, Ser: 4.71 mg/dL — ABNORMAL HIGH (ref 0.61–1.24)
GFR calc Af Amer: 13 mL/min — ABNORMAL LOW (ref >60)
GFR calc non Af Amer: 11 mL/min — ABNORMAL LOW (ref >60)
Glucose, Bld: 432 mg/dL — ABNORMAL HIGH (ref 70–99)
POTASSIUM: 3.4 mmol/L — AB (ref 3.5–5.1)
SODIUM: 132 mmol/L — AB (ref 135–145)

## 2017-09-29 LAB — BODY FLUID CULTURE: Culture: NO GROWTH

## 2017-09-29 LAB — PHOSPHORUS: PHOSPHORUS: 4.5 mg/dL (ref 2.5–4.6)

## 2017-09-29 MED ORDER — ALLOPURINOL 100 MG PO TABS: 50.0000 mg | ORAL_TABLET | ORAL | 2 refills | Status: DC

## 2017-09-29 MED ORDER — INSULIN DETEMIR 100 UNIT/ML ~~LOC~~ SOLN
20.0000 [IU] | Freq: Every day | SUBCUTANEOUS | Status: DC
  Administered 2017-09-29: 09:00:00 20 [IU] via SUBCUTANEOUS
  Filled 2017-09-29 (×2): qty 0.2

## 2017-09-29 MED ORDER — OXYCODONE-ACETAMINOPHEN 5-325 MG PO TABS: 1.0000 | ORAL_TABLET | Freq: Three times a day (TID) | ORAL | 0 refills | Status: DC | PRN

## 2017-09-29 MED ORDER — PREDNISONE 10 MG PO TABS: ORAL_TABLET | ORAL | 0 refills | Status: DC

## 2017-09-29 NOTE — Progress Notes (Signed)
Pt being discharged today to [facility] after HD, report called to [place], EMS to transport

## 2017-09-29 NOTE — Progress Notes (Signed)
This note also relates to the following rows which could not be included: Pulse Rate - Cannot attach notes to unvalidated device data Resp - Cannot attach notes to unvalidated device data BP - Cannot attach notes to unvalidated device data SpO2 - Cannot attach notes to unvalidated device data  Hd started  

## 2017-09-29 NOTE — Discharge Summary (Signed)
[facility] [place] NAME: Joseph Newman    MR#:  [id number]  DATE OF BIRTH:  05/06/1945  DATE OF ADMISSION:  09/25/2017   ADMITTING PHYSICIAN: Epifanio Lesches, MD  DATE OF DISCHARGE: 09/29/17  PRIMARY CARE PHYSICIAN: Steele Sizer, MD   ADMISSION DIAGNOSIS:   Pain [R52] Left arm weakness [R29.898] Leukocytosis, unspecified type [D72.829]  DISCHARGE DIAGNOSIS:   Active Problems:   Left arm weakness   Sepsis ([place])   SECONDARY DIAGNOSIS:   Past Medical History:  Diagnosis Date  . Arthritis   . Chronic airway obstruction, not elsewhere classified   . 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 Holley Raring, nephrologist  . Depressive disorder, not elsewhere classified   . Heart murmur   . Hyperlipidemia   . Hypertension   . Hypertrophy of prostate without urinary obstruction and other lower urinary tract symptoms (LUTS)   . Impotence of organic origin   . Microalbuminuria   . 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 Obesity, unspecified   . Plantar wart   . Sleep apnea   . Synovitis and tenosynovitis, unspecified   . Type II or unspecified type diabetes mellitus without mention of complication, uncontrolled   . Unspecified venous (peripheral) insufficiency     HOSPITAL COURSE:   72 year old African-American male with past medical history significant for diabetes, hypertension, end-stage renal disease on peritoneal dialysis, gout presents to hospital secondary to severe pain and weakness of his left elbow and left wrist.  1.  Acute gouty arthritis-polyarticular joint pain with inflammation.  Worse in the left elbow and right wrist and left wrist.  Slight effusion of left knee. -Appreciate rheumatology consult.  Status post left knee effusion tap, exam showing crystals. -Received 1 dose of IV steroids, started on allopurinol and prednisone taper. -Significant improvement in pain and swelling  noted of the joints. -Will be discharged on prednisone taper and allopurinol for now. -Uric acid is improving as well.  2.  End-stage renal disease-appreciate nephrology consult.  On peritoneal dialysis at home.  However due to need to go to rehab, changed to hemodialysis in the hospital.  Has a PD catheter in. -Right chest permacath has been placed.  2.  Diabetes mellitus with hyperglycemia-secondary to being on steroids.  Steroids will be tapered off.  Patient was on Lantus here, discharged on Trulicity weekly.  4.  Hypertension-on Norvasc, olmesartan, Coreg and hydrochlorothiazide  5.  Hyperlipidemia-on statin  Physical therapy recommended rehab.  Patient will be discharged to [facility]:   [facility]  CONSULTS OBTAINED:   Treatment Team:  Murlean Iba, MD Earnestine Leys, MD  DRUG ALLERGIES:   No Known Allergies DISCHARGE MEDICATIONS:   Allergies as of 09/29/2017   No Known Allergies     Medication List    STOP taking these medications   gentamicin cream 0.1 % Commonly known as:  GARAMYCIN   metFORMIN 1000 MG tablet Commonly known as:  GLUCOPHAGE     TAKE these medications   allopurinol 100 MG tablet Commonly known as:  ZYLOPRIM Take 0.5 tablets (50 mg total) by mouth every other day. Start taking on:  09/30/2017   aspirin EC 81 MG tablet Take 81 mg by mouth daily.   atorvastatin 40 MG tablet Commonly known as:  LIPITOR Take 1 tablet (40 mg total) by mouth daily.   calcitRIOL 0.25 MCG capsule Commonly known as:  ROCALTROL Take 0.25 mcg by  mouth daily.   carvedilol 6.25 MG tablet Commonly known as:  COREG Take 6.25 mg by mouth 2 (two) times daily with a meal.   citalopram 10 MG tablet Commonly known as:  CELEXA Take 10 mg by mouth daily.   Olmesartan-amLODIPine-HCTZ 40-10-25 MG Tabs Take 1 tablet by mouth daily.   ONE TOUCH ULTRA SYSTEM KIT w/Device Kit 1 kit by Does not apply route once.   oxyCODONE-acetaminophen  5-325 MG tablet Commonly known as:  PERCOCET/ROXICET Take 1 tablet by mouth every 8 (eight) hours as needed for moderate pain or severe pain.   predniSONE 10 MG tablet Commonly known as:  DELTASONE Take 3 tablets PO daily on 09/30/17 and then 2 tablets orally on 10/01/17 and 1 tablet on 10/02/17 orally and stop   sodium bicarbonate 650 MG tablet TAKE TWO (2) TABLETS BY MOUTH TWICE A DAY   tamsulosin 0.4 MG Caps capsule Commonly known as:  FLOMAX Take 0.4 mg by mouth daily after breakfast.   TRULICITY 1.5 HW/8.6HU Sopn Generic drug:  Dulaglutide INJECT 1.5 MG SUBCUTANEOUSLY EVERY 7 (SEVEN) DAYS.   Vitamin B-12 1000 MCG Subl Take 1 tablet by mouth daily.   Vitamin D 2000 units Caps Take 1 capsule by mouth daily.        DISCHARGE INSTRUCTIONS:   1.  PCP follow-up in 1 to 2 weeks 2.  Nephrology follow-up in 2 days for dialysis starting 10/01/2017 3.  Rheumatology follow-up as needed  DIET:   Renal diet  ACTIVITY:   Activity as tolerated  OXYGEN:   Home Oxygen: No.  Oxygen Delivery: room air  DISCHARGE LOCATION:   nursing home   If you experience worsening of your admission symptoms, develop shortness of breath, life threatening emergency, suicidal or homicidal thoughts you must seek medical attention immediately by calling 911 or calling your MD immediately  if symptoms less severe.  You Must read complete instructions/literature along with all the possible adverse reactions/side effects for all the Medicines you take and that have been prescribed to you. Take any new Medicines after you have completely understood and accpet all the possible adverse reactions/side effects.   Please note  You were cared for by a hospitalist during your hospital stay. If you have any questions about your discharge medications or the care you received while you were in the hospital after you are discharged, you can call the unit and asked to speak with the hospitalist on call if the  hospitalist that took care of you is not available. Once you are discharged, your primary care physician will handle any further medical issues. Please note that NO REFILLS for any discharge medications will be authorized once you are discharged, as it is imperative that you return to your primary care physician (or establish a relationship with a primary care physician if you do not have one) for your aftercare needs so that they can reassess your need for medications and monitor your lab values.    On the day of Discharge:  VITAL SIGNS:   Blood pressure 134/67, pulse 76, temperature 98.7 F (37.1 C), temperature source Oral, resp. rate 17, height _0  (1.956 m), weight 116.9 kg, SpO2 94 %.  PHYSICAL EXAMINATION:    GENERAL:  72 y.o.-year-old patient lying in the bed with no acute distress.  EYES: Pupils equal, round, reactive to light and accommodation. No scleral icterus. Extraocular muscles intact.  HEENT: Head atraumatic, normocephalic. Oropharynx and nasopharynx clear.  NECK:  Supple, no jugular venous distention. No  thyroid enlargement, no tenderness.  LUNGS: Normal breath sounds bilaterally, no wheezing, rales,rhonchi or crepitation. No use of accessory muscles of respiration. Decreased bibasilar breath sounds CARDIOVASCULAR: S1, S2 normal. No  rubs, or gallops. 2/6 systolic murmur present ABDOMEN: Soft, non-tender, non-distended. Bowel sounds present. No organomegaly or mass.  Peritoneal dialysis catheter present. EXTREMITIES: No pedal edema, cyanosis, or clubbing.  Right chest permacath is noted. NEUROLOGIC: Cranial nerves II through XII are intact. Muscle strength 5/5 in all extremities except limited left elbow movement due to pain. Sensation intact. Gait not checked.  PSYCHIATRIC: The patient is alert and oriented x 3.  Low to respond. SKIN: No obvious rash, lesion, or ulcer.   DATA REVIEW:   CBC Recent Labs  Lab 09/28/17 0415  WBC 12.3*  HGB 10.4*  HCT 30.5*  PLT 232      Chemistries  Recent Labs  Lab 09/25/17 1337  09/29/17 0459  NA 136   < > 132*  K 3.7   < > 3.4*  CL 96*   < > 92*  CO2 27   < > 28  GLUCOSE 214*   < > 432*  BUN 54*   < > 54*  CREATININE 5.78*   < > 4.71*  CALCIUM 8.5*   < > 8.4*  AST 16  --   --   ALT 9  --   --   ALKPHOS 74  --   --   BILITOT 0.6  --   --    < > = values in this interval not displayed.     Microbiology Results  Results for orders placed or performed during the hospital encounter of 09/25/17  Blood culture (routine x 2)     Status: None (Preliminary result)   Collection Time: 09/25/17  1:37 PM  Result Value Ref Range Status   Specimen Description BLOOD RIGHT FA  Final   Special Requests   Final    BOTTLES DRAWN AEROBIC AND ANAEROBIC Blood Culture results may not be optimal due to an excessive volume of blood received in culture bottles   Culture   Final    NO GROWTH 4 DAYS Performed at [facility], [address] [place], [place] [address]    Report Status PENDING  Incomplete  Blood culture (routine x 2)     Status: None (Preliminary result)   Collection Time: 09/25/17  2:51 PM  Result Value Ref Range Status   Specimen Description BLOOD RIGHT ANTECUBITAL  Final   Special Requests   Final    BOTTLES DRAWN AEROBIC AND ANAEROBIC Blood Culture results may not be optimal due to an excessive volume of blood received in culture bottles   Culture  Setup Time PENDING  Incomplete   Culture   Final    NO GROWTH 4 DAYS Performed at [facility], [address] [place], [place] [address]    Report Status PENDING  Incomplete  Body fluid culture     Status: None   Collection Time: 09/26/17  4:54 PM  Result Value Ref Range Status   Specimen Description   Final    PERITONEAL Performed at [facility], [address] [place], [place] [address]    Special Requests   Final    NONE Performed at [facility], [address] [place], [place] [address]    Gram Stain    Final    WBC PRESENT, PREDOMINANTLY MONONUCLEAR NO ORGANISMS SEEN CYTOSPIN SMEAR    Culture  Final    NO GROWTH 3 DAYS Performed at [facility], [place] [address], [place], [place] [address]    Report Status 09/29/2017 FINAL  Final    RADIOLOGY:  Dg Chest 1 View  Result Date: 09/28/2017 CLINICAL DATA:  Hypoxia. EXAM: CHEST  1 VIEW COMPARISON:  CT 03/05/2013.  Chest x-ray 07/31/2006.  CT 09/03/2007. FINDINGS: Cardiomegaly with pulmonary vascular prominence and bilateral interstitial prominence consistent with CHF. IMPRESSION: Cardiomegaly with diffuse bilateral from interstitial prominence suggesting CHF. Electronically Signed   By: Marcello Moores  Register   On: 09/28/2017 13:18     Management plans discussed with the patient, family and they are in agreement.  CODE STATUS:     Code Status Orders  (From admission, onward)         Start     Ordered   09/25/17 1612  Full code  Continuous     09/25/17 1615        Code Status History    This patient has a current code status but no historical code status.      TOTAL TIME TAKING CARE OF THIS PATIENT: 38 minutes.    Gladstone Lighter M.D on 09/29/2017 at 12:38 PM  Between 7am to 6pm - Pager - [phone]  After 6pm go to [email or web address] - Technical [employer]wer [facility]  Office  [phone]  CC: Primary care physician; Steele Sizer, MD   Note: This dictation was prepared with Dragon dictation along with smaller phrase technology. Any transcriptional errors that result from this process are unintentional.

## 2017-09-29 NOTE — Clinical Social Work Note (Signed)
The patient will discharge to [facility] today via EMS once he has completed HD. The RNCM is aware to contact [facility] for upcoming outpatient HD while the patient is in SNF. The PPD reading will be noted prior to discharge. The CSW has delivered the discharge packet to the chart and all necessary documentation to the facility. The CSW is signing off. Please consult should needs arise.  Santiago Bumpers, MSW, Latanya Presser [phone]

## 2017-09-29 NOTE — Progress Notes (Signed)
[facility], [place] 09/29/17  Subjective:   Patient known to our practice from outpatient dialysis He presents for severe elbow and left wrist pain He was evaluated by rheumatologist Dr. Jefm Bryant and left knee joint aspiration was performed Results show intracellular and extracellular monosodium urate crystals consistent with acute gout  Patient received his PermCath yesterday Currently doing well.  Ate 100% of his breakfast   Objective:  Vital signs in last 24 hours:  Temp:  [97.5 F (36.4 C)-98.3 F (36.8 C)] 98.2 F (36.8 C) (09/07 0420) Pulse Rate:  [75-85] 79 (09/07 0420) Resp:  [18-20] 20 (09/07 0420) BP: (126-150)/(61-80) 137/73 (09/07 0420) SpO2:  [87 %-97 %] 96 % (09/07 0420) FiO2 (%):  [91 %-95 %] 95 % (09/06 1541) Weight:  [116.9 kg] 116.9 kg (09/07 0500)  Weight change: 9.437 kg Filed Weights   09/25/17 1331 09/27/17 0814 09/29/17 0500  Weight: 105.5 kg 107.5 kg 116.9 kg    Intake/Output:    Intake/Output Summary (Last 24 hours) at 09/29/2017 1117 Last data filed at 09/29/2017 1000 Gross per 24 hour  Intake 14116.87 ml  Output 1693 ml  Net 12423.87 ml     Physical Exam: General:  No acute distress, laying in the bed  HEENT  anicteric, moist oral mucous membranes  Neck  supple  Pulm/lungs  lungs are clear to auscultation bilaterally  CVS/Heart  regular rhythm, no rub or gallop  Abdomen:   Soft, nontender  Extremities:  Left elbow swelling, left wrist swelling, limiting ROM  Neurologic:  Alert, oriented  Skin:  No acute rashes  Access:  PD catheter, exit site nontender   Right IJ PermCath    Basic Metabolic Panel:  Recent Labs  Lab 09/25/17 1337 09/26/17 0819 09/27/17 0432 09/28/17 0415 09/29/17 0459  NA 136 136 133* 133* 132*  K 3.7 3.9 3.6 3.4* 3.4*  CL 96* 95* 93* 92* 92*  CO2 27 28 26 25 28   GLUCOSE 214* 184* 238* 336* 432*  BUN 54* 63* 61* 54* 54*  CREATININE 5.78* 6.08* 6.00* 5.27* 4.71*  CALCIUM 8.5*  8.2* 8.2* 8.2* 8.4*  PHOS  --   --   --   --  4.5     CBC: Recent Labs  Lab 09/25/17 1337 09/26/17 0819 09/27/17 0432 09/28/17 0415  WBC 15.8* 18.6* 16.2* 12.3*  NEUTROABS 12.8*  --   --   --   HGB 10.6* 10.1* 10.6* 10.4*  HCT 31.3* 28.9* 30.8* 30.5*  MCV 85.9 85.3 85.1 86.1  PLT 226 205 218 232      Lab Results  Component Value Date   HEPBSAG Negative 09/27/2017      Microbiology:  Recent Results (from the past 240 hour(s))  Blood culture (routine x 2)     Status: None (Preliminary result)   Collection Time: 09/25/17  1:37 PM  Result Value Ref Range Status   Specimen Description BLOOD RIGHT FA  Final   Special Requests   Final    BOTTLES DRAWN AEROBIC AND ANAEROBIC Blood Culture results may not be optimal due to an excessive volume of blood received in culture bottles   Culture   Final    NO GROWTH 4 DAYS Performed at [facility], [address] [place], [place] [address]    Report Status PENDING  Incomplete  Blood culture (routine x 2)     Status: None (Preliminary result)   Collection Time: 09/25/17  2:51 PM  Result Value Ref Range Status  Specimen Description BLOOD RIGHT ANTECUBITAL  Final   Special Requests   Final    BOTTLES DRAWN AEROBIC AND ANAEROBIC Blood Culture results may not be optimal due to an excessive volume of blood received in culture bottles   Culture  Setup Time PENDING  Incomplete   Culture   Final    NO GROWTH 4 DAYS Performed at [facility], [address], [place], [place] [address]    Report Status PENDING  Incomplete  Body fluid culture     Status: None   Collection Time: 09/26/17  4:54 PM  Result Value Ref Range Status   Specimen Description   Final    PERITONEAL Performed at [facility], [address] [place], [place] [address]    Special Requests   Final    NONE Performed at [facility], [address] [place], [place] [address]    Gram Stain   Final    WBC PRESENT,  PREDOMINANTLY MONONUCLEAR NO ORGANISMS SEEN CYTOSPIN SMEAR    Culture   Final    NO GROWTH 3 DAYS Performed at [facility], [place] [address] [place], [place] [address]    Report Status 09/29/2017 FINAL  Final    Coagulation Studies: No results for input(s): LABPROT, INR in the last 72 hours.  Urinalysis: Recent Labs    09/27/17 2025  COLORURINE YELLOW*  LABSPEC 1.018  PHURINE 6.0  GLUCOSEU >=500*  HGBUR SMALL*  BILIRUBINUR NEGATIVE  KETONESUR NEGATIVE  PROTEINUR 100*  NITRITE NEGATIVE  LEUKOCYTESUR MODERATE*      Imaging: Dg Chest 1 View  Result Date: 09/28/2017 CLINICAL DATA:  Hypoxia. EXAM: CHEST  1 VIEW COMPARISON:  CT 03/05/2013.  Chest x-ray 07/31/2006.  CT 09/03/2007. FINDINGS: Cardiomegaly with pulmonary vascular prominence and bilateral interstitial prominence consistent with CHF. IMPRESSION: Cardiomegaly with diffuse bilateral from interstitial prominence suggesting CHF. Electronically Signed   By: Marcello Moores  Register   On: 09/28/2017 13:18     Medications:   . sodium chloride Stopped (09/26/17 2226)  .  ceFAZolin (ANCEF) IV    . dialysis solution 1.5% low-MG/low-CA     . allopurinol  50 mg Oral QODAY  . amLODipine  10 mg Oral Daily  . aspirin  81 mg Oral Daily  . atorvastatin  40 mg Oral Daily  . calcitRIOL  0.25 mcg Oral Daily  . carvedilol  6.25 mg Oral BID WC  . Chlorhexidine Gluconate Cloth  6 each Topical Q0600  . cholecalciferol  1,000 Units Oral Daily  . citalopram  10 mg Oral Daily  . gentamicin cream  1 application Topical Daily  . heparin  5,000 Units Subcutaneous Q8H  . insulin aspart  0-5 Units Subcutaneous QHS  . insulin aspart  0-9 Units Subcutaneous TID WC  . insulin detemir  20 Units Subcutaneous Daily  . irbesartan  300 mg Oral Daily  . [START ON 09/30/2017] predniSONE  30 mg Oral Q breakfast   Followed by  . [START ON 10/01/2017] predniSONE  20 mg Oral Q breakfast   Followed by  . [START ON 10/02/2017] predniSONE  10 mg Oral Q  breakfast  . sodium bicarbonate  650 mg Oral BID  . tuberculin  5 Units Intradermal Once  . vitamin B-12  1,000 mcg Oral Daily   sodium chloride, acetaminophen **OR** acetaminophen (TYLENOL) oral liquid 160 mg/5 mL **OR** acetaminophen, heparin, heparin, HYDROmorphone (DILAUDID) injection, oxyCODONE-acetaminophen  Assessment/ Plan:  72 y.o. African-American male with COPD, arthritis, end-stage renal disease, depression,  sleep apnea, diabetes type 2 with neuropathy, hypertension,  Presents with left elbow and wrist swelling and low-grade fever and is found to have acute Gout  Peritoneal dialysis/[facility]/ 107 kg/ 4 x 3000 cc, last fill 2000 cc  1.  End-stage renal disease 2.  Left elbow and left wrist pain and swelling- Acute Gout 3.  Anemia of chronic kidney disease 4.  Secondary hyperparathyroidism  Patient is deconditioning therefore plans are for him to go to short-term rehab In order to do that, patient has to be converted into hemodialysis Hemodialysis orders for outpatient unit sent to the discharge planning team Patient will need to be able to sit in the chair for duration of dialysis.   We will try that today     LOS: [place] 9/7/201911:17 AM  [place]ole, Ava  Note: This note was prepared with Dragon dictation. Any transcription errors are unintentional

## 2017-09-29 NOTE — Progress Notes (Signed)
Pd completed 

## 2017-09-29 NOTE — Progress Notes (Addendum)
EMS transporting pt, pt with no complaints, wife at bedside

## 2017-09-29 NOTE — Care Management (Signed)
PPD negative 9/7 1500. HD liaison Estill Bamberg notified on discharge.

## 2017-09-29 NOTE — Progress Notes (Signed)
PPD neg, 49mm induration

## 2017-09-29 NOTE — Progress Notes (Signed)
This note also relates to the following rows which could not be included: Pulse Rate - Cannot attach notes to unvalidated device data Resp - Cannot attach notes to unvalidated device data BP - Cannot attach notes to unvalidated device data  Hd completed  

## 2017-09-29 NOTE — Progress Notes (Signed)
OT Cancellation Note  Patient Details Name: Joseph Newman MRN: [id number] DOB: 1945-11-22   Cancelled Treatment:    Reason Eval/Treat Not Completed: Patient at procedure or test/ unavailable. Pt unavailable, receiving dialysis. Will re-attempt OT tx at later date/time as pt is available and medically appropriate.   Jeni Salles, MPH, MS, OTR/L ascom [phone] 09/29/17, 12:38 PM

## 2017-09-30 LAB — CULTURE, BLOOD (ROUTINE X 2): CULTURE: NO GROWTH

## 2017-10-01 ENCOUNTER — Encounter: Payer: Self-pay | Admitting: Vascular Surgery

## 2017-10-01 LAB — CULTURE, BLOOD (ROUTINE X 2): Culture: NO GROWTH

## 2017-10-02 ENCOUNTER — Encounter: Payer: Self-pay | Admitting: Podiatry

## 2017-10-02 ENCOUNTER — Ambulatory Visit: Payer: Medicare Other | Admitting: Podiatry

## 2017-10-02 DIAGNOSIS — L97512 Non-pressure chronic ulcer of other part of right foot with fat layer exposed: Secondary | ICD-10-CM

## 2017-10-02 DIAGNOSIS — E0842 Diabetes mellitus due to underlying condition with diabetic polyneuropathy: Secondary | ICD-10-CM | POA: Diagnosis not present

## 2017-10-02 DIAGNOSIS — L989 Disorder of the skin and subcutaneous tissue, unspecified: Secondary | ICD-10-CM | POA: Diagnosis not present

## 2017-10-02 DIAGNOSIS — I70235 Atherosclerosis of native arteries of right leg with ulceration of other part of foot: Secondary | ICD-10-CM

## 2017-10-04 NOTE — Progress Notes (Signed)
   Subjective:  72 year old male presenting today for follow up evaluation of an ulceration to the right foot and a pre-ulcerative callus lesion of the left foot. He states he is doing well overall. He states the wound is healing appropriately and has been using the gentamicin cream as directed. He denies any new complaints or modifying factors at this time. Patient is here for further evaluation and treatment.   Past Medical History:  Diagnosis Date  . Arthritis   . Chronic airway obstruction, not elsewhere classified   . 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 Holley Raring, nephrologist  . Depressive disorder, not elsewhere classified   . Heart murmur   . Hyperlipidemia   . Hypertension   . Hypertrophy of prostate without urinary obstruction and other lower urinary tract symptoms (LUTS)   . Impotence of organic origin   . Microalbuminuria   . 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 Obesity, unspecified   . Plantar wart   . Sleep apnea   . Synovitis and tenosynovitis, unspecified   . Type II or unspecified type diabetes mellitus without mention of complication, uncontrolled   . Unspecified venous (peripheral) insufficiency      Objective/Physical Exam General: The patient is alert and oriented x3 in no acute distress.  Dermatology:  Wound #1 noted to the right forefoot measuring 0.3 x 0.5 x 0.2 cm (LxWxD).   To the noted ulceration(s), there is no eschar. There is a moderate amount of slough, fibrin, and necrotic tissue noted. Granulation tissue and wound base is red. There is a minimal amount of serosanguineous drainage noted. There is no exposed bone muscle-tendon ligament or joint. There is no malodor. Periwound integrity is intact. Hyperkeratotic lesion present on the left forefoot. Pain on palpation with a central nucleated core noted.  Vascular: Palpable pedal pulses bilaterally. No edema or erythema noted. Capillary refill within normal  limits.  Neurological: Epicritic and protective threshold diminished bilaterally.   Musculoskeletal Exam: Range of motion within normal limits to all pedal and ankle joints bilateral. Muscle strength 5/5 in all groups bilateral.   Assessment: #1 ulceration of the right forefoot secondary to DM #2 Pre-ulcerative callus lesion noted to the left forefoot  Plan of Care:  #1 Patient was evaluated. #2 medically necessary excisional debridement including subcutaneous tissue was performed using a tissue nipper and a chisel blade. Excisional debridement of all the necrotic nonviable tissue down to healthy bleeding viable tissue was performed with post-debridement measurements same as pre-. #3 the wound was cleansed and dry sterile dressing applied. #4 Continue using gentamicin cream daily with a bandage.  #5 Excisional debridement of keratoic lesion using a chisel blade was performed without incident. Light dressing applied.  #6 Return to clinic in 4 weeks.     Edrick Kins, DPM [facility]  Dr. Edrick Kins, [place]                                        [place], [place] [address]                Office [phone]  Fax [phone]

## 2017-10-22 DIAGNOSIS — M1A00X0 Idiopathic chronic gout, unspecified site, without tophus (tophi): Secondary | ICD-10-CM | POA: Insufficient documentation

## 2017-10-22 DIAGNOSIS — M1A9XX0 Chronic gout, unspecified, without tophus (tophi): Secondary | ICD-10-CM | POA: Insufficient documentation

## 2017-11-01 ENCOUNTER — Emergency Department: Payer: Medicare Other

## 2017-11-01 ENCOUNTER — Inpatient Hospital Stay
Admission: EM | Admit: 2017-11-01 | Discharge: 2017-11-07 | DRG: 070 | Disposition: A | Discharge status: Skilled Nursing Facility | Payer: Medicare Other | Source: Other Acute Inpatient Hospital | Attending: Internal Medicine | Admitting: Internal Medicine

## 2017-11-01 ENCOUNTER — Other Ambulatory Visit: Payer: Self-pay

## 2017-11-01 DIAGNOSIS — D631 Anemia in chronic kidney disease: Secondary | ICD-10-CM | POA: Diagnosis present

## 2017-11-01 DIAGNOSIS — J811 Chronic pulmonary edema: Secondary | ICD-10-CM | POA: Diagnosis present

## 2017-11-01 DIAGNOSIS — J440 Chronic obstructive pulmonary disease with acute lower respiratory infection: Secondary | ICD-10-CM | POA: Diagnosis present

## 2017-11-01 DIAGNOSIS — D869 Sarcoidosis, unspecified: Secondary | ICD-10-CM | POA: Diagnosis present

## 2017-11-01 DIAGNOSIS — J9601 Acute respiratory failure with hypoxia: Secondary | ICD-10-CM | POA: Diagnosis present

## 2017-11-01 DIAGNOSIS — R1312 Dysphagia, oropharyngeal phase: Secondary | ICD-10-CM

## 2017-11-01 DIAGNOSIS — J209 Acute bronchitis, unspecified: Secondary | ICD-10-CM | POA: Diagnosis present

## 2017-11-01 DIAGNOSIS — Z23 Encounter for immunization: Secondary | ICD-10-CM

## 2017-11-01 DIAGNOSIS — I4581 Long QT syndrome: Secondary | ICD-10-CM | POA: Diagnosis present

## 2017-11-01 DIAGNOSIS — F329 Major depressive disorder, single episode, unspecified: Secondary | ICD-10-CM | POA: Diagnosis present

## 2017-11-01 DIAGNOSIS — E1122 Type 2 diabetes mellitus with diabetic chronic kidney disease: Secondary | ICD-10-CM | POA: Diagnosis present

## 2017-11-01 DIAGNOSIS — Z8249 Family history of ischemic heart disease and other diseases of the circulatory system: Secondary | ICD-10-CM

## 2017-11-01 DIAGNOSIS — G9341 Metabolic encephalopathy: Principal | ICD-10-CM | POA: Diagnosis present

## 2017-11-01 DIAGNOSIS — E785 Hyperlipidemia, unspecified: Secondary | ICD-10-CM | POA: Diagnosis present

## 2017-11-01 DIAGNOSIS — I872 Venous insufficiency (chronic) (peripheral): Secondary | ICD-10-CM | POA: Diagnosis present

## 2017-11-01 DIAGNOSIS — Z7982 Long term (current) use of aspirin: Secondary | ICD-10-CM

## 2017-11-01 DIAGNOSIS — J8410 Pulmonary fibrosis, unspecified: Secondary | ICD-10-CM | POA: Diagnosis present

## 2017-11-01 DIAGNOSIS — J189 Pneumonia, unspecified organism: Secondary | ICD-10-CM | POA: Diagnosis present

## 2017-11-01 DIAGNOSIS — R0902 Hypoxemia: Secondary | ICD-10-CM

## 2017-11-01 DIAGNOSIS — M109 Gout, unspecified: Secondary | ICD-10-CM | POA: Diagnosis present

## 2017-11-01 DIAGNOSIS — E1169 Type 2 diabetes mellitus with other specified complication: Secondary | ICD-10-CM | POA: Diagnosis present

## 2017-11-01 DIAGNOSIS — Z6824 Body mass index (BMI) 24.0-24.9, adult: Secondary | ICD-10-CM

## 2017-11-01 DIAGNOSIS — N2581 Secondary hyperparathyroidism of renal origin: Secondary | ICD-10-CM | POA: Diagnosis present

## 2017-11-01 DIAGNOSIS — E1140 Type 2 diabetes mellitus with diabetic neuropathy, unspecified: Secondary | ICD-10-CM | POA: Diagnosis present

## 2017-11-01 DIAGNOSIS — I120 Hypertensive chronic kidney disease with stage 5 chronic kidney disease or end stage renal disease: Secondary | ICD-10-CM | POA: Diagnosis present

## 2017-11-01 DIAGNOSIS — N186 End stage renal disease: Secondary | ICD-10-CM | POA: Diagnosis present

## 2017-11-01 DIAGNOSIS — R4182 Altered mental status, unspecified: Secondary | ICD-10-CM | POA: Diagnosis not present

## 2017-11-01 DIAGNOSIS — N400 Enlarged prostate without lower urinary tract symptoms: Secondary | ICD-10-CM | POA: Diagnosis present

## 2017-11-01 DIAGNOSIS — F17290 Nicotine dependence, other tobacco product, uncomplicated: Secondary | ICD-10-CM | POA: Diagnosis present

## 2017-11-01 DIAGNOSIS — Z79899 Other long term (current) drug therapy: Secondary | ICD-10-CM

## 2017-11-01 DIAGNOSIS — R531 Weakness: Secondary | ICD-10-CM

## 2017-11-01 DIAGNOSIS — R630 Anorexia: Secondary | ICD-10-CM | POA: Diagnosis present

## 2017-11-01 DIAGNOSIS — Z8619 Personal history of other infectious and parasitic diseases: Secondary | ICD-10-CM

## 2017-11-01 DIAGNOSIS — E876 Hypokalemia: Secondary | ICD-10-CM | POA: Diagnosis present

## 2017-11-01 DIAGNOSIS — F17210 Nicotine dependence, cigarettes, uncomplicated: Secondary | ICD-10-CM | POA: Diagnosis present

## 2017-11-01 DIAGNOSIS — E8770 Fluid overload, unspecified: Secondary | ICD-10-CM

## 2017-11-01 DIAGNOSIS — Z66 Do not resuscitate: Secondary | ICD-10-CM | POA: Diagnosis present

## 2017-11-01 DIAGNOSIS — R0602 Shortness of breath: Secondary | ICD-10-CM | POA: Diagnosis present

## 2017-11-01 DIAGNOSIS — Z515 Encounter for palliative care: Secondary | ICD-10-CM | POA: Diagnosis not present

## 2017-11-01 DIAGNOSIS — Z992 Dependence on renal dialysis: Secondary | ICD-10-CM

## 2017-11-01 LAB — CBC
HEMATOCRIT: 30.2 % — AB (ref 39.0–52.0)
Hemoglobin: 10.1 g/dL — ABNORMAL LOW (ref 13.0–17.0)
MCH: 27.7 pg (ref 26.0–34.0)
MCHC: 33.4 g/dL (ref 30.0–36.0)
MCV: 83 fL (ref 80.0–100.0)
NRBC: 0 % (ref 0.0–0.2)
PLATELETS: 223 10*3/uL (ref 150–400)
RBC: 3.64 MIL/uL — AB (ref 4.22–5.81)
RDW: 15.3 % (ref 11.5–15.5)
WBC: 10.1 10*3/uL (ref 4.0–10.5)

## 2017-11-01 LAB — COMPREHENSIVE METABOLIC PANEL
ALT: 10 U/L (ref 0–44)
AST: 14 U/L — AB (ref 15–41)
Albumin: 2.5 g/dL — ABNORMAL LOW (ref 3.5–5.0)
Alkaline Phosphatase: 71 U/L (ref 38–126)
Anion gap: 9 (ref 5–15)
BUN: 15 mg/dL (ref 8–23)
CHLORIDE: 100 mmol/L (ref 98–111)
CO2: 30 mmol/L (ref 22–32)
CREATININE: 1.85 mg/dL — AB (ref 0.61–1.24)
Calcium: 8.4 mg/dL — ABNORMAL LOW (ref 8.9–10.3)
GFR calc Af Amer: 40 mL/min — ABNORMAL LOW (ref >60)
GFR, EST NON AFRICAN AMERICAN: 34 mL/min — AB (ref >60)
Glucose, Bld: 149 mg/dL — ABNORMAL HIGH (ref 70–99)
Potassium: 3 mmol/L — ABNORMAL LOW (ref 3.5–5.1)
Sodium: 139 mmol/L (ref 135–145)
Total Bilirubin: 0.7 mg/dL (ref 0.3–1.2)
Total Protein: 6.8 g/dL (ref 6.5–8.1)

## 2017-11-01 LAB — TROPONIN I: TROPONIN I: 0.06 ng/mL — AB (ref <0.03)

## 2017-11-01 LAB — GLUCOSE, CAPILLARY
GLUCOSE-CAPILLARY: 181 mg/dL — AB (ref 70–99)
Glucose-Capillary: 116 mg/dL — ABNORMAL HIGH (ref 70–99)

## 2017-11-01 MED ORDER — AMLODIPINE BESYLATE 10 MG PO TABS
10.0000 mg | ORAL_TABLET | Freq: Every day | ORAL | Status: DC
  Administered 2017-11-01 – 2017-11-06 (×6): 10 mg via ORAL
  Filled 2017-11-01 (×7): qty 1

## 2017-11-01 MED ORDER — VITAMIN B-12 1000 MCG PO TABS
1000.0000 ug | ORAL_TABLET | Freq: Every day | ORAL | Status: DC
  Administered 2017-11-03 – 2017-11-07 (×5): 1000 ug via ORAL
  Filled 2017-11-01 (×5): qty 1

## 2017-11-01 MED ORDER — GUAIFENESIN-DM 100-10 MG/5ML PO SYRP
5.0000 mL | ORAL_SOLUTION | ORAL | Status: DC | PRN
  Administered 2017-11-02 – 2017-11-05 (×2): 5 mL via ORAL
  Filled 2017-11-01 (×4): qty 5

## 2017-11-01 MED ORDER — HYDROCHLOROTHIAZIDE 25 MG PO TABS
25.0000 mg | ORAL_TABLET | Freq: Every day | ORAL | Status: DC
  Administered 2017-11-01: 22:00:00 25 mg via ORAL

## 2017-11-01 MED ORDER — ALBUTEROL SULFATE (2.5 MG/3ML) 0.083% IN NEBU: 2.5000 mg | INHALATION_SOLUTION | RESPIRATORY_TRACT | Status: DC | PRN

## 2017-11-01 MED ORDER — ACETAMINOPHEN 650 MG RE SUPP: 650.0000 mg | Freq: Four times a day (QID) | RECTAL | Status: DC | PRN

## 2017-11-01 MED ORDER — INFLUENZA VAC SPLIT HIGH-DOSE 0.5 ML IM SUSY
0.5000 mL | PREFILLED_SYRINGE | INTRAMUSCULAR | Status: AC
  Administered 2017-11-03: 0.5 mL via INTRAMUSCULAR
  Filled 2017-11-01: qty 0.5

## 2017-11-01 MED ORDER — HEPARIN SODIUM (PORCINE) 5000 UNIT/ML IJ SOLN
5000.0000 [IU] | Freq: Three times a day (TID) | INTRAMUSCULAR | Status: DC
  Administered 2017-11-01 – 2017-11-07 (×13): 5000 [IU] via SUBCUTANEOUS
  Filled 2017-11-01 (×15): qty 1

## 2017-11-01 MED ORDER — HYDRALAZINE HCL 20 MG/ML IJ SOLN: 10.0000 mg | INTRAMUSCULAR | Status: DC | PRN

## 2017-11-01 MED ORDER — SENNOSIDES-DOCUSATE SODIUM 8.6-50 MG PO TABS: 1.0000 | ORAL_TABLET | Freq: Every evening | ORAL | Status: DC | PRN

## 2017-11-01 MED ORDER — AZITHROMYCIN 500 MG PO TABS
250.0000 mg | ORAL_TABLET | Freq: Every day | ORAL | Status: DC
  Administered 2017-11-01 – 2017-11-03 (×3): 250 mg via ORAL
  Filled 2017-11-01 (×3): qty 1

## 2017-11-01 MED ORDER — HYDROCODONE-ACETAMINOPHEN 5-325 MG PO TABS
1.0000 | ORAL_TABLET | ORAL | Status: DC | PRN
  Administered 2017-11-05: 2 via ORAL
  Filled 2017-11-01: qty 2

## 2017-11-01 MED ORDER — CARVEDILOL 3.125 MG PO TABS
6.2500 mg | ORAL_TABLET | Freq: Two times a day (BID) | ORAL | Status: DC
  Administered 2017-11-01 – 2017-11-07 (×10): 6.25 mg via ORAL
  Filled 2017-11-01 (×12): qty 2

## 2017-11-01 MED ORDER — VITAMIN D 1000 UNITS PO TABS
2000.0000 [IU] | ORAL_TABLET | Freq: Every day | ORAL | Status: DC
  Administered 2017-11-01 – 2017-11-07 (×6): 2000 [IU] via ORAL
  Filled 2017-11-01 (×6): qty 2

## 2017-11-01 MED ORDER — SODIUM BICARBONATE 650 MG PO TABS
1300.0000 mg | ORAL_TABLET | Freq: Two times a day (BID) | ORAL | Status: DC
  Administered 2017-11-01 – 2017-11-02 (×2): 1300 mg via ORAL
  Filled 2017-11-01 (×5): qty 2

## 2017-11-01 MED ORDER — SODIUM CHLORIDE 0.9% FLUSH
3.0000 mL | Freq: Two times a day (BID) | INTRAVENOUS | Status: DC
  Administered 2017-11-01 – 2017-11-07 (×12): 3 mL via INTRAVENOUS

## 2017-11-01 MED ORDER — ONDANSETRON HCL 4 MG/2ML IJ SOLN: 4.0000 mg | Freq: Four times a day (QID) | INTRAMUSCULAR | Status: DC | PRN

## 2017-11-01 MED ORDER — ASPIRIN EC 81 MG PO TBEC
81.0000 mg | DELAYED_RELEASE_TABLET | Freq: Every day | ORAL | Status: DC
  Administered 2017-11-01 – 2017-11-07 (×7): 81 mg via ORAL
  Filled 2017-11-01 (×7): qty 1

## 2017-11-01 MED ORDER — IRBESARTAN 150 MG PO TABS
300.0000 mg | ORAL_TABLET | Freq: Every day | ORAL | Status: DC
  Administered 2017-11-01 – 2017-11-06 (×6): 300 mg via ORAL
  Filled 2017-11-01 (×7): qty 2

## 2017-11-01 MED ORDER — ACETAMINOPHEN 325 MG PO TABS: 650.0000 mg | ORAL_TABLET | Freq: Four times a day (QID) | ORAL | Status: DC | PRN

## 2017-11-01 MED ORDER — DOCUSATE SODIUM 100 MG PO CAPS
100.0000 mg | ORAL_CAPSULE | Freq: Two times a day (BID) | ORAL | Status: DC
  Administered 2017-11-01 – 2017-11-03 (×4): 100 mg via ORAL
  Filled 2017-11-01 (×4): qty 1

## 2017-11-01 MED ORDER — ALLOPURINOL 100 MG PO TABS
50.0000 mg | ORAL_TABLET | ORAL | Status: DC
  Administered 2017-11-01 – 2017-11-07 (×4): 50 mg via ORAL
  Filled 2017-11-01 (×4): qty 0.5

## 2017-11-01 MED ORDER — SODIUM CHLORIDE 0.9% FLUSH
3.0000 mL | INTRAVENOUS | Status: DC | PRN
  Administered 2017-11-05: 3 mL via INTRAVENOUS
  Filled 2017-11-01: qty 3

## 2017-11-01 MED ORDER — CITALOPRAM HYDROBROMIDE 20 MG PO TABS
10.0000 mg | ORAL_TABLET | Freq: Every day | ORAL | Status: DC
  Administered 2017-11-01 – 2017-11-07 (×7): 10 mg via ORAL
  Filled 2017-11-01 (×7): qty 1

## 2017-11-01 MED ORDER — ATORVASTATIN CALCIUM 20 MG PO TABS
40.0000 mg | ORAL_TABLET | Freq: Every day | ORAL | Status: DC
  Administered 2017-11-01 – 2017-11-04 (×4): 40 mg via ORAL
  Filled 2017-11-01 (×4): qty 2

## 2017-11-01 MED ORDER — CALCITRIOL 0.25 MCG PO CAPS
0.2500 ug | ORAL_CAPSULE | Freq: Every day | ORAL | Status: DC
  Administered 2017-11-01 – 2017-11-07 (×7): 0.25 ug via ORAL
  Filled 2017-11-01 (×7): qty 1

## 2017-11-01 MED ORDER — SODIUM CHLORIDE 0.9 % IV SOLN
250.0000 mL | INTRAVENOUS | Status: DC | PRN
  Administered 2017-11-06: 18:00:00 10 mL via INTRAVENOUS

## 2017-11-01 MED ORDER — INSULIN ASPART 100 UNIT/ML ~~LOC~~ SOLN
0.0000 [IU] | Freq: Three times a day (TID) | SUBCUTANEOUS | Status: DC
  Administered 2017-11-02 – 2017-11-03 (×3): 2 [IU] via SUBCUTANEOUS
  Administered 2017-11-04: 5 [IU] via SUBCUTANEOUS
  Administered 2017-11-04 – 2017-11-05 (×3): 2 [IU] via SUBCUTANEOUS
  Administered 2017-11-05: 1 [IU] via SUBCUTANEOUS
  Administered 2017-11-06: 2 [IU] via SUBCUTANEOUS
  Administered 2017-11-06: 1 [IU] via SUBCUTANEOUS
  Administered 2017-11-06 – 2017-11-07 (×2): 3 [IU] via SUBCUTANEOUS
  Administered 2017-11-07: 5 [IU] via SUBCUTANEOUS
  Administered 2017-11-07: 7 [IU] via SUBCUTANEOUS
  Filled 2017-11-01 (×14): qty 1

## 2017-11-01 MED ORDER — ONDANSETRON HCL 4 MG PO TABS: 4.0000 mg | ORAL_TABLET | Freq: Four times a day (QID) | ORAL | Status: DC | PRN

## 2017-11-01 MED ORDER — OLMESARTAN-AMLODIPINE-HCTZ 40-10-25 MG PO TABS: 1.0000 | ORAL_TABLET | Freq: Every day | ORAL | Status: DC

## 2017-11-01 MED ORDER — CARVEDILOL 3.125 MG PO TABS: 6.2500 mg | ORAL_TABLET | Freq: Two times a day (BID) | ORAL | Status: DC

## 2017-11-01 MED ORDER — TAMSULOSIN HCL 0.4 MG PO CAPS
0.4000 mg | ORAL_CAPSULE | Freq: Every day | ORAL | Status: DC
  Administered 2017-11-02 – 2017-11-07 (×6): 0.4 mg via ORAL
  Filled 2017-11-01 (×6): qty 1

## 2017-11-01 MED ORDER — INSULIN ASPART 100 UNIT/ML ~~LOC~~ SOLN
0.0000 [IU] | Freq: Every day | SUBCUTANEOUS | Status: DC
  Administered 2017-11-03 – 2017-11-06 (×2): 2 [IU] via SUBCUTANEOUS
  Filled 2017-11-01 (×2): qty 1

## 2017-11-01 MED ORDER — BISACODYL 5 MG PO TBEC: 5.0000 mg | DELAYED_RELEASE_TABLET | Freq: Every day | ORAL | Status: DC | PRN

## 2017-11-01 NOTE — ED Provider Notes (Signed)
[facility] Emergency Department Provider Note  Time seen: 3:27 PM  I have reviewed the triage vital signs and the nursing notes.   HISTORY  Chief Complaint Altered Mental Status    HPI Joseph Newman is a 72 y.o. male with a past medical history of ESRD on hemodialysis Tuesday/Thursday/Saturday, hypertension, hyperlipidemia, diabetes, presents to the emergency department for altered mental status.  According to EMS report they report a progressive decline in mental status over the past several days.  Patient seems more fatigued, weak and is disoriented.  Currently he believes he is at [place] when asked what year it is he states "the president" when asked review of systems questioning he says "I do not know."  Patient denies any pain.  Patient received dialysis today and was sent from the dialysis center to the ER for evaluation.   Past Medical History:  Diagnosis Date  . Arthritis   . Chronic airway obstruction, not elsewhere classified   . 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 Holley Raring, nephrologist  . Depressive disorder, not elsewhere classified   . Heart murmur   . Hyperlipidemia   . Hypertension   . Hypertrophy of prostate without urinary obstruction and other lower urinary tract symptoms (LUTS)   . Impotence of organic origin   . Microalbuminuria   . 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 Obesity, unspecified   . Plantar wart   . Sleep apnea   . Synovitis and tenosynovitis, unspecified   . Type II or unspecified type diabetes mellitus without mention of complication, uncontrolled   . Unspecified venous (peripheral) insufficiency     Patient Active Problem List   Diagnosis Date Noted  . Sepsis ([place]) 09/26/2017  . Left arm weakness 09/25/2017  . Dyslipidemia associated with type 2 diabetes mellitus ([place]) 03/13/2016  . Diabetic ulcer of toe of right foot associated with type 2 diabetes mellitus, limited  to breakdown of skin ([place]) 03/07/2016  . Microscopic hematuria 05/30/2015  . 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 ([place]) 12/31/2014  . Dyslipidemia 08/31/2014  . Tobacco abuse 08/31/2014  . Diabetes mellitus with renal manifestation ([place]) 08/31/2014  . COPD (chronic obstructive pulmonary disease) ([place]) 08/31/2014  . Microalbuminuria 08/31/2014  . ED (erectile dysfunction) 08/31/2014  . Hypertension   . Dyspnea 10/09/2011    Past Surgical History:  Procedure Laterality Date  . DIALYSIS/PERMA CATHETER INSERTION N/A 09/28/2017   Procedure: DIALYSIS/PERMA CATHETER INSERTION;  Surgeon: Katha Cabal, MD;  Location: [facility];  Service: Cardiovascular;  Laterality: N/A;  . foot surgery      Prior to Admission medications   Medication Sig Start Date End Date Taking? Authorizing Provider  allopurinol (ZYLOPRIM) 100 MG tablet Take 0.5 tablets (50 mg total) by mouth every other day. 09/30/17   Gladstone Lighter, MD  aspirin EC 81 MG tablet Take 81 mg by mouth daily.    [provider]  atorvastatin (LIPITOR) 40 MG tablet Take 1 tablet (40 mg total) by mouth daily. 06/15/17   Steele Sizer, MD  Blood Glucose Monitoring Suppl (ONE TOUCH ULTRA SYSTEM KIT) W/DEVICE KIT 1 kit by Does not apply route once.    [provider]  calcitRIOL (ROCALTROL) 0.25 MCG capsule Take 0.25 mcg by mouth daily.  04/02/17   [provider]  carvedilol (COREG) 6.25 MG tablet Take 6.25 mg by mouth 2 (two) times daily with a meal.  04/05/15   Anthonette Legato, MD  Cholecalciferol (VITAMIN D) 2000 UNITS CAPS Take 1 capsule by mouth daily.     [provider]  citalopram (CELEXA) 10 MG tablet Take 10 mg by mouth daily. 09/06/17   [provider]  Cyanocobalamin (VITAMIN B-12) 1000 MCG SUBL Take 1 tablet by mouth daily.    [provider]  Dulaglutide (TRULICITY) 1.5 TD/1.7OH SOPN INJECT 1.5 MG SUBCUTANEOUSLY EVERY 7 (SEVEN) DAYS.  10/02/16   [provider]  Olmesartan-amLODIPine-HCTZ 40-10-25 MG TABS Take 1 tablet by mouth daily. 06/15/17   Steele Sizer, MD  oxyCODONE-acetaminophen (PERCOCET/ROXICET) 5-325 MG tablet Take 1 tablet by mouth every 8 (eight) hours as needed for moderate pain or severe pain. 09/29/17   Gladstone Lighter, MD  predniSONE (DELTASONE) 10 MG tablet Take 3 tablets PO daily on 09/30/17 and then 2 tablets orally on 10/01/17 and 1 tablet on 10/02/17 orally and stop 09/29/17   Gladstone Lighter, MD  sodium bicarbonate 650 MG tablet TAKE TWO (2) TABLETS BY MOUTH TWICE A DAY 03/27/16   [provider]  tamsulosin (FLOMAX) 0.4 MG CAPS capsule Take 0.4 mg by mouth daily after breakfast.     [provider]    No Known Allergies  Family History  Problem Relation Age of Onset  . Hypertension Mother   . Kidney disease Neg Hx   . Prostate cancer Neg Hx     Social History Social History   Tobacco Use  . Smoking status: Current Every Day Smoker    Packs/day: 0.25    Years: 30.00    Pack years: 7.50    Types: Cigarettes, Cigars    Last attempt to quit: 02/25/2003    Years since quitting: 14.6  . Smokeless tobacco: Never Used  . Tobacco comment: he is cutting down, used to smoke one pack day   Substance Use Topics  . Alcohol use: Yes    Alcohol/week: 0.0 standard drinks    Comment: occasional  . Drug use: No    Review of Systems Unable to obtain an adequate/accurate review of systems secondary to altered mental status.  ____________________________________________   PHYSICAL EXAM:  VITAL SIGNS: ED Triage Vitals  Enc Vitals Group     BP 11/01/17 1506 (!) 172/87     Pulse Rate 11/01/17 1506 85     Resp 11/01/17 1506 18     Temp 11/01/17 1506 98.2 F (36.8 C)     Temp Source 11/01/17 1506 Oral     SpO2 11/01/17 1506 93 %     Weight --      Height 11/01/17 1507 _0  (1.956 m)     Head Circumference --      Peak Flow --      Pain Score --      Pain Loc --       Pain Edu? --      Excl. in [place]? --    Constitutional: Awake and alert, disoriented to place and time.  Slow responses. Eyes: Normal exam ENT   Head: Normocephalic and atraumatic.   Mouth/Throat: Mucous membranes are moist. Cardiovascular: Normal rate, regular rhythm.  Respiratory: Normal respiratory effort without tachypnea nor retractions. Breath sounds are clear.  Right subclavian dialysis catheter in place. Gastrointestinal: Soft and nontender. No distention.  Peritoneal dialysis catheter is present Musculoskeletal: Lower extremity edema, states this is chronic. Neurologic: Normal speech but slow responses.  Patient does appear to have some left-sided weakness but in reviewing the patient's records it appears that this is chronic. Skin:  Skin is warm, dry and intact.  Psychiatric: Mood and affect are normal. Speech and behavior are normal.   ____________________________________________    EKG  EKG reviewed and interpreted by myself shows sinus tachycardia 109 bpm with a narrow QRS, normal axis, normal intervals besides slight QTC prolongation, nonspecific ST changes.  No ST elevation.  ____________________________________________    RADIOLOGY  CT head shows no acute abnormality. Chest x-ray shows pulmonary fibrosis possible interstitial edema.  ____________________________________________   INITIAL IMPRESSION / ASSESSMENT AND PLAN / ED COURSE  Pertinent labs & imaging results that were available during my care of the patient were reviewed by me and considered in my medical decision making (see chart for details).  Patient presents to the emergency department for generalized weakness/fatigue as well as altered mental status.  Differential this time is quite broad but would include infectious etiologies, electrolyte or metabolic abnormality, dehydration or overdiuresis, CVA.  We will check labs, chest x-ray, CT scan of the head and continue to closely monitor.  Chest  x-ray shows pulmonary fibrosis with interstitial edema, CT scan of the head is negative.  Labs showed elevated troponin 0.06 otherwise are largely nonrevealing.  Patient did desaturate down into the 80s, this could possibly be due to the interstitial edema and may require further dialysis.  Currently the patient appears well continues to be weak in appearance, but answers questions follows most commands.  Patient will be admitted to the hospital service for continued work-up for altered mental status and fluid overload  ____________________________________________   FINAL CLINICAL IMPRESSION(S) / ED DIAGNOSES  Altered mental status Fluid overload   Harvest Dark, MD 11/01/17 1707

## 2017-11-01 NOTE — H&P (Addendum)
[facility] NAME: Joseph Newman    MR#:  [id number]  DATE OF BIRTH:  1945/07/18  DATE OF ADMISSION:  11/01/2017  PRIMARY CARE PHYSICIAN: Steele Sizer, MD   REQUESTING/REFERRING PHYSICIAN: Dr. Kerman Passey  CHIEF COMPLAINT:   Chief Complaint  Patient presents with  . Altered Mental Status   Altered mental status today. HISTORY OF PRESENT ILLNESS:  Joseph Newman  is a 72 y.o. male with a known history of multiple medical problems including ESRD on hemodialysis, hypertension, diabetes, hyperlipidemia, BPH, sleep apnea, gout, arthritis, etc. the patient is sent to ED from dialysis center due to altered mental status.  The patient does not know why he is sent here.  He denies any symptoms except the cough.  According to his wife, the patient is living in [place] home.  He has had cough for 4 to 5 days.  He is on hemodialysis today but was found confusion by the nurse and sent to ED.  CAT scan of the head is unremarkable.  Chest x-ray show pulmonary fibrosis or interstitial edema.  ED physician request admission.  He was admitted for CVA work-up last month but no CVA. PAST MEDICAL HISTORY:   Past Medical History:  Diagnosis Date  . Arthritis   . Chronic airway obstruction, not elsewhere classified   . 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 Holley Raring, nephrologist  . Depressive disorder, not elsewhere classified   . Heart murmur   . Hyperlipidemia   . Hypertension   . Hypertrophy of prostate without urinary obstruction and other lower urinary tract symptoms (LUTS)   . Impotence of organic origin   . Microalbuminuria   . 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 Obesity, unspecified   . Plantar wart   . Sleep apnea   . Synovitis and tenosynovitis, unspecified   . Type II or unspecified type diabetes mellitus without mention of complication, uncontrolled   . Unspecified venous  (peripheral) insufficiency     PAST SURGICAL HISTORY:   Past Surgical History:  Procedure Laterality Date  . DIALYSIS/PERMA CATHETER INSERTION N/A 09/28/2017   Procedure: DIALYSIS/PERMA CATHETER INSERTION;  Surgeon: Katha Cabal, MD;  Location: [facility];  Service: Cardiovascular;  Laterality: N/A;  . foot surgery      SOCIAL HISTORY:   Social History   Tobacco Use  . Smoking status: Current Every Day Smoker    Packs/day: 0.25    Years: 30.00    Pack years: 7.50    Types: Cigarettes, Cigars    Last attempt to quit: 02/25/2003    Years since quitting: 14.6  . Smokeless tobacco: Never Used  . Tobacco comment: he is cutting down, used to smoke one pack day   Substance Use Topics  . Alcohol use: Yes    Alcohol/week: 0.0 standard drinks    Comment: occasional    FAMILY HISTORY:   Family History  Problem Relation Age of Onset  . Hypertension Mother   . Kidney disease Neg Hx   . Prostate cancer Neg Hx     DRUG ALLERGIES:  No Known Allergies  REVIEW OF SYSTEMS:   Review of Systems  Constitutional: Negative for chills, fever and malaise/fatigue.  HENT: Negative for sore throat.   Eyes: Negative for blurred vision and double vision.  Respiratory: Positive for cough. Negative for hemoptysis, shortness of breath, wheezing and stridor.   Cardiovascular: Negative for chest  pain, palpitations, orthopnea and leg swelling.  Gastrointestinal: Negative for abdominal pain, blood in stool, diarrhea, melena, nausea and vomiting.  Genitourinary: Negative for dysuria, flank pain and hematuria.  Musculoskeletal: Negative for back pain and joint pain.  Skin: Negative for rash.  Neurological: Negative for dizziness, sensory change, focal weakness, seizures, loss of consciousness, weakness and headaches.  Endo/Heme/Allergies: Negative for polydipsia.  Psychiatric/Behavioral: Negative for depression. The patient is not nervous/anxious.     MEDICATIONS AT HOME:   Prior  to Admission medications   Medication Sig Start Date End Date Taking? Authorizing Provider  allopurinol (ZYLOPRIM) 100 MG tablet Take 0.5 tablets (50 mg total) by mouth every other day. 09/30/17  Yes Gladstone Lighter, MD  aspirin EC 81 MG tablet Take 81 mg by mouth daily.   Yes [provider]  carvedilol (COREG) 6.25 MG tablet Take 6.25 mg by mouth 2 (two) times daily with a meal.  04/05/15  Yes Lateef, Munsoor, MD  citalopram (CELEXA) 10 MG tablet Take 10 mg by mouth daily. 09/06/17  Yes [provider]  Dulaglutide (TRULICITY) 1.5 QG/9.2EF SOPN INJECT 1.5 MG SUBCUTANEOUSLY EVERY 7 (SEVEN) DAYS. 10/02/16  Yes [provider]  Olmesartan-amLODIPine-HCTZ 40-10-25 MG TABS Take 1 tablet by mouth daily. 06/15/17  Yes Sowles, Drue Stager, MD  oxyCODONE-acetaminophen (PERCOCET/ROXICET) 5-325 MG tablet Take 1 tablet by mouth every 8 (eight) hours as needed for moderate pain or severe pain. 09/29/17  Yes Gladstone Lighter, MD  tamsulosin (FLOMAX) 0.4 MG CAPS capsule Take 0.4 mg by mouth daily after breakfast.    Yes [provider]  atorvastatin (LIPITOR) 40 MG tablet Take 1 tablet (40 mg total) by mouth daily. 06/15/17   Steele Sizer, MD  Blood Glucose Monitoring Suppl (ONE TOUCH ULTRA SYSTEM KIT) W/DEVICE KIT 1 kit by Does not apply route once.    [provider]  calcitRIOL (ROCALTROL) 0.25 MCG capsule Take 0.25 mcg by mouth daily.  04/02/17   [provider]  Cholecalciferol (VITAMIN D) 2000 UNITS CAPS Take 1 capsule by mouth daily.     [provider]  Cyanocobalamin (VITAMIN B-12) 1000 MCG SUBL Take 1 tablet by mouth daily.    [provider]  predniSONE (DELTASONE) 10 MG tablet Take 3 tablets PO daily on 09/30/17 and then 2 tablets orally on 10/01/17 and 1 tablet on 10/02/17 orally and stop 09/29/17   Gladstone Lighter, MD  sodium bicarbonate 650 MG tablet TAKE TWO (2) TABLETS BY MOUTH TWICE A DAY 03/27/16   [provider]       VITAL SIGNS:  Blood pressure (!) 172/87, pulse 85, temperature 98.2 F (36.8 C), temperature source Oral, resp. rate 18, height 6' 5"  (1.956 m), SpO2 93 %.  PHYSICAL EXAMINATION:  Physical Exam  GENERAL:  72 y.o.-year-old patient lying in the bed with no acute distress.  EYES: Pupils equal, round, reactive to light and accommodation. No scleral icterus. Extraocular muscles intact.  HEENT: Head atraumatic, normocephalic. Oropharynx and nasopharynx clear.  NECK:  Supple, no jugular venous distention. No thyroid enlargement, no tenderness.  LUNGS: Normal breath sounds bilaterally, no wheezing, bilateral rales, no rhonchi or crepitation. No use of accessory muscles of respiration.  CARDIOVASCULAR: S1, S2 normal. No murmurs, rubs, or gallops.  ABDOMEN: Soft, nontender, nondistended. Bowel sounds present. No organomegaly or mass.  EXTREMITIES: No pedal edema, cyanosis, or clubbing.  NEUROLOGIC: Cranial nerves II through XII are intact. Muscle strength 5/5 in all extremities. Sensation intact. Gait not checked.  PSYCHIATRIC: The patient is alert  and oriented x 3.  SKIN: No obvious rash, lesion, or ulcer.   LABORATORY PANEL:   CBC Recent Labs  Lab 11/01/17 1542  WBC 10.1  HGB 10.1*  HCT 30.2*  PLT 223   ------------------------------------------------------------------------------------------------------------------  Chemistries  Recent Labs  Lab 11/01/17 1542  NA 139  K 3.0*  CL 100  CO2 30  GLUCOSE 149*  BUN 15  CREATININE 1.85*  CALCIUM 8.4*  AST 14*  ALT 10  ALKPHOS 71  BILITOT 0.7   ------------------------------------------------------------------------------------------------------------------  Cardiac Enzymes Recent Labs  Lab 11/01/17 1542  TROPONINI 0.06*   ------------------------------------------------------------------------------------------------------------------  RADIOLOGY:  Dg Chest 2 View  Result Date: 11/01/2017 CLINICAL DATA:  Altered  mental status.  Renal failure EXAM: CHEST - 2 VIEW COMPARISON:  September 28, 2017 FINDINGS: There is diffuse interstitial fibrosis; there may be a degree of superimposed chronic interstitial edema. There is no consolidation. Heart is mildly enlarged with pulmonary vascularity normal. No adenopathy. There is aortic atherosclerosis. Central catheter tip is in the superior vena cava near the cavoatrial junction. No appreciable bone lesions. IMPRESSION: Diffuse pulmonary fibrosis. There may be a degree of superimposed chronic interstitial edema. There is no consolidation. There is cardiomegaly with pulmonary vascularity normal. There is aortic atherosclerosis. Aortic Atherosclerosis (ICD10-I70.0). Electronically Signed   By: Lowella Grip III M.D.   On: 11/01/2017 16:23   Ct Head Wo Contrast  Result Date: 11/01/2017 CLINICAL DATA:  72 year old male with altered mental status. Patient on dialysis. EXAM: CT HEAD WITHOUT CONTRAST TECHNIQUE: Contiguous axial images were obtained from the base of the skull through the vertex without intravenous contrast. COMPARISON:  09/25/2017 CT and MR FINDINGS: Brain: No evidence of acute infarction, hemorrhage, hydrocephalus, extra-axial collection or mass lesion/mass effect. Atrophy and chronic small-vessel white matter ischemic changes noted. Vascular: Vertebral and carotid atherosclerotic calcifications again noted. Skull: Normal. Negative for fracture or focal lesion. Sinuses/Orbits: Near complete opacification of LEFT frontal, anterior ethmoid and LEFT maxillary sinuses noted. Fluid in the LEFT frontal sinus noted. Other: None IMPRESSION: 1. No evidence of acute intracranial abnormality 2. Atrophy and chronic small-vessel white matter ischemic changes 3. LEFT paranasal sinusitis, with acute component/fluid in the LEFT frontal sinus. Electronically Signed   By: Margarette Canada M.D.   On: 11/01/2017 16:03      IMPRESSION AND PLAN:   Acute metabolic encephalopathy, unclear  etiology. The patient will be placed for observation. Aspiration the fall precaution.  Mild fluid overload with interstitial lung edema, acute bronchitis. Continue hemodialysis.  Robitussin as needed, Zithromax. Dialysis tonight per Dr. Juleen China.  Hypokalemia.  Adjust during hemodialysis.  Follow-up BMP.  Accelerated hypertension.  Continue home hypertension medication. ESRD.  Continue hemodialysis as scheduled.  Nephrology consult.  Diabetes.  Start sliding scale.  Discussed with Dr. Juleen China. All the records are reviewed and case discussed with ED provider. Management plans discussed with the patient, his wife and they are in agreement.  CODE STATUS: DNR.  TOTAL TIME TAKING CARE OF THIS PATIENT: 38 minutes.    Demetrios Loll M.D on 11/01/2017 at 5:37 PM  Between 7am to 6pm - Pager - [phone]  After 6pm go to [email or web address] - Technical brewer [facility]  Office  [phone]  CC: Primary care physician; Steele Sizer, MD   Note: This dictation was prepared with Dragon dictation along with smaller phrase technology. Any transcriptional errors that result from this process are unin

## 2017-11-01 NOTE — ED Triage Notes (Signed)
Pt arrived via EMS from dialysis for decreased LOC. Staff stated that his mental status has been declining over a period of time of receiving dialysis at the facility and he seems more confused today. Per EMS dialysis pulled off 300 today and administered a 800 bolus PTA due to possible dehydration.

## 2017-11-01 NOTE — ED Notes (Signed)
Date and time results received: 11/01/17 1617 (use smartphrase ".now" to insert current time)  Test: Troponin Critical Value: 0.06  Name of Provider Notified: MD Paduchowski   Orders Received? Or Actions Taken?: No orders received

## 2017-11-01 NOTE — Progress Notes (Signed)
[facility]  ROUNDING NOTE   Subjective:   [name] admitted to [facility] on 11/01/2017 for Weakness [R53.1] Hypoxia [R09.02] Altered mental status, unspecified altered mental status type [R41.82] Hypervolemia, unspecified hypervolemia type [E87.70]  Wife at bedside.   Patient did not complete his hemodialysis treatment today as outpatient. Started to become confused and uncooperative.    Objective:  Vital signs in last 24 hours:  Temp:  [98 F (36.7 C)-98.2 F (36.8 C)] 98 F (36.7 C) (10/10 1833) Pulse Rate:  [83-85] 83 (10/10 1833) Resp:  [18] 18 (10/10 1833) BP: (172-186)/(79-87) 186/79 (10/10 1833) SpO2:  [93 %-97 %] 97 % (10/10 1833)  Weight change:  There were no vitals filed for this visit.  Intake/Output: No intake/output data recorded.   Intake/Output this shift:  No intake/output data recorded.  Physical Exam: General: NAD, laying in bed  Head: Normocephalic, atraumatic. Moist oral mucosal membranes  Eyes: Anicteric, PERRL  Neck: Supple, trachea midline  Lungs:  Clear to auscultation  Heart: Regular rate and rhythm  Abdomen:  Soft, nontender,   Extremities: no peripheral edema.  Neurologic: Alert to self only  Skin: No lesions  Access: RIJ permcath, PD catheter    Basic Metabolic Panel: Recent Labs  Lab 11/01/17 1542  NA 139  K 3.0*  CL 100  CO2 30  GLUCOSE 149*  BUN 15  CREATININE 1.85*  CALCIUM 8.4*    Liver Function Tests: Recent Labs  Lab 11/01/17 1542  AST 14*  ALT 10  ALKPHOS 71  BILITOT 0.7  PROT 6.8  ALBUMIN 2.5*   No results for input(s): LIPASE, AMYLASE in the last 168 hours. No results for input(s): AMMONIA in the last 168 hours.  CBC: Recent Labs  Lab 11/01/17 1542  WBC 10.1  HGB 10.1*  HCT 30.2*  MCV 83.0  PLT 223    Cardiac Enzymes: Recent Labs  Lab 11/01/17 1542  TROPONINI 0.06*    BNP: Invalid input(s): POCBNP  CBG: Recent Labs  Lab 11/01/17 1505  GLUCAP 116*     Microbiology: Results for orders placed or performed during the hospital encounter of 09/25/17  Blood culture (routine x 2)     Status: None   Collection Time: 09/25/17  1:37 PM  Result Value Ref Range Status   Specimen Description BLOOD RIGHT FA  Final   Special Requests   Final    BOTTLES DRAWN AEROBIC AND ANAEROBIC Blood Culture results may not be optimal due to an excessive volume of blood received in culture bottles   Culture   Final    NO GROWTH 5 DAYS Performed at [facility], [address] [place], [place] [address]    Report Status 09/30/2017 FINAL  Final  Blood culture (routine x 2)     Status: None   Collection Time: 09/25/17  2:51 PM  Result Value Ref Range Status   Specimen Description BLOOD RIGHT ANTECUBITAL  Final   Special Requests   Final    BOTTLES DRAWN AEROBIC AND ANAEROBIC Blood Culture results may not be optimal due to an excessive volume of blood received in culture bottles   Culture   Final    NO GROWTH 5 DAYS Performed at [facility], [address] [place], [place] [address]    Report Status 10/01/2017 FINAL  Final  Body fluid culture     Status: None   Collection Time: 09/26/17  4:54 PM  Result Value Ref Range Status   Specimen Description  Final    PERITONEAL Performed at [facility], [address] [place], [place] [address]    Special Requests   Final    NONE Performed at [facility], [address] [place], [place] [address]    Gram Stain   Final    WBC PRESENT, PREDOMINANTLY MONONUCLEAR NO ORGANISMS SEEN CYTOSPIN SMEAR    Culture   Final    NO GROWTH 3 DAYS Performed at [facility], [place] [address], [place], [place] [address]    Report Status 09/29/2017 FINAL  Final    Coagulation Studies: No results for input(s): LABPROT, INR in the last 72 hours.  Urinalysis: No results for input(s): COLORURINE, LABSPEC, PHURINE, GLUCOSEU, HGBUR, BILIRUBINUR, KETONESUR, PROTEINUR,  UROBILINOGEN, NITRITE, LEUKOCYTESUR in the last 72 hours.  Invalid input(s): APPERANCEUR    Imaging: Dg Chest 2 View  Result Date: 11/01/2017 CLINICAL DATA:  Altered mental status.  Renal failure EXAM: CHEST - 2 VIEW COMPARISON:  September 28, 2017 FINDINGS: There is diffuse interstitial fibrosis; there may be a degree of superimposed chronic interstitial edema. There is no consolidation. Heart is mildly enlarged with pulmonary vascularity normal. No adenopathy. There is aortic atherosclerosis. Central catheter tip is in the superior vena cava near the cavoatrial junction. No appreciable bone lesions. IMPRESSION: Diffuse pulmonary fibrosis. There may be a degree of superimposed chronic interstitial edema. There is no consolidation. There is cardiomegaly with pulmonary vascularity normal. There is aortic atherosclerosis. Aortic Atherosclerosis (ICD10-I70.0). Electronically Signed   By: Lowella Grip III M.D.   On: 11/01/2017 16:23   Ct Head Wo Contrast  Result Date: 11/01/2017 CLINICAL DATA:  72 year old male with altered mental status. Patient on dialysis. EXAM: CT HEAD WITHOUT CONTRAST TECHNIQUE: Contiguous axial images were obtained from the base of the skull through the vertex without intravenous contrast. COMPARISON:  09/25/2017 CT and MR FINDINGS: Brain: No evidence of acute infarction, hemorrhage, hydrocephalus, extra-axial collection or mass lesion/mass effect. Atrophy and chronic small-vessel white matter ischemic changes noted. Vascular: Vertebral and carotid atherosclerotic calcifications again noted. Skull: Normal. Negative for fracture or focal lesion. Sinuses/Orbits: Near complete opacification of LEFT frontal, anterior ethmoid and LEFT maxillary sinuses noted. Fluid in the LEFT frontal sinus noted. Other: None IMPRESSION: 1. No evidence of acute intracranial abnormality 2. Atrophy and chronic small-vessel white matter ischemic changes 3. LEFT paranasal sinusitis, with acute  component/fluid in the LEFT frontal sinus. Electronically Signed   By: Margarette Canada M.D.   On: 11/01/2017 16:03     Medications:   . sodium chloride     . allopurinol  50 mg Oral QODAY  . irbesartan  300 mg Oral Daily   And  . amLODipine  10 mg Oral Daily   And  . hydrochlorothiazide  25 mg Oral Daily  . aspirin EC  81 mg Oral Daily  . atorvastatin  40 mg Oral Daily  . azithromycin  250 mg Oral Daily  . calcitRIOL  0.25 mcg Oral Daily  . [START ON 11/02/2017] carvedilol  6.25 mg Oral BID WC  . cholecalciferol  2,000 Units Oral Daily  . citalopram  10 mg Oral Daily  . docusate sodium  100 mg Oral BID  . heparin  5,000 Units Subcutaneous Q8H  . insulin aspart  0-5 Units Subcutaneous QHS  . insulin aspart  0-9 Units Subcutaneous TID WC  . sodium bicarbonate  1,300 mg Oral BID  . sodium chloride flush  3 mL Intravenous Q12H  . [START ON 11/02/2017] tamsulosin  0.4 mg Oral QPC breakfast  . vitamin B-12  1,000 mcg Oral Daily   sodium chloride, acetaminophen **OR** acetaminophen, albuterol, bisacodyl, guaiFENesin-dextromethorphan, HYDROcodone-acetaminophen, ondansetron **OR** ondansetron (ZOFRAN) IV, senna-docusate, sodium chloride flush  Assessment/ Plan:  [name] is a 72 y.o. black male with end stage renal disease on dialysis, COPD, arthritis, depression, sleep apnea, diabetes mellitus type 2 with neuropathy, hypertension,  and gout  Currently on back up hemodialysis [facility] permcath  Peritoneal dialysis prescription 107 kg/ 4 x 3000 mL, last fill 2000 mL  1.  End-stage renal disease: did not complete full hemodialysis treatment today. However with no acute indication to complete treatment.  Monitor hemodialysis treatment.   2. Hypertension: elevated. 186/79 - resume home carvedilol, tamsulosin, irbesartan - Discontinue hydrochlorothiazide due to renal insufficiency and gout (hyperuricemia) - PRN IV hydralazine ordered.   3. Altered mental  status: CT negative. Unclear of cause. Could be medication induced.   4. Anemia of chronic kidney disease: hemoglobin 10.1 - EPO with HD treatment.   5. Secondary Hyperparathyroidism: outpatient PTH 156, phosphorus and calcium at goal - not currently on binders.   6. Gout: not in acute flare.  - allopurinol    LOS: 0 Antwain Caliendo 10/10/20196:55 PM

## 2017-11-01 NOTE — Progress Notes (Signed)
Advanced Care Plan.  Purpose of Encounter: CODE STATUS. Parties in Attendance: The patient, his wife in the me. Patient's Decisional Capacity: Not sure. Medical Story: Joseph Newman  is a 72 y.o. male with a known history of multiple medical problems including ESRD on hemodialysis, hypertension, diabetes, hyperlipidemia, BPH, sleep apnea, gout, arthritis, etc. the patient is sent to ED from dialysis center due to altered mental status.  He is being admitted for acute metabolic encephalopathy, mild fluid overload and hypokalemia.  I discussed with the patient and his wife about his current condition, prognosis and CODE STATUS.  The patient said he does not want to be resuscitated and intubated.  But since the patient was sent for altered mental status, I discussed with his wife about DNR.  His wife said the patient is in DNR status. Plan:  Code Status: DNR. Time spent discussing advance care planning: 18 minutes.

## 2017-11-02 LAB — BASIC METABOLIC PANEL
Anion gap: 9 (ref 5–15)
BUN: 21 mg/dL (ref 8–23)
CHLORIDE: 101 mmol/L (ref 98–111)
CO2: 28 mmol/L (ref 22–32)
CREATININE: 2.58 mg/dL — AB (ref 0.61–1.24)
Calcium: 8.1 mg/dL — ABNORMAL LOW (ref 8.9–10.3)
GFR calc Af Amer: 27 mL/min — ABNORMAL LOW (ref >60)
GFR calc non Af Amer: 23 mL/min — ABNORMAL LOW (ref >60)
Glucose, Bld: 191 mg/dL — ABNORMAL HIGH (ref 70–99)
Potassium: 2.9 mmol/L — ABNORMAL LOW (ref 3.5–5.1)
SODIUM: 138 mmol/L (ref 135–145)

## 2017-11-02 LAB — CBC
HEMATOCRIT: 28.4 % — AB (ref 39.0–52.0)
HEMOGLOBIN: 9.4 g/dL — AB (ref 13.0–17.0)
MCH: 27.3 pg (ref 26.0–34.0)
MCHC: 33.1 g/dL (ref 30.0–36.0)
MCV: 82.6 fL (ref 80.0–100.0)
NRBC: 0 % (ref 0.0–0.2)
Platelets: 205 10*3/uL (ref 150–400)
RBC: 3.44 MIL/uL — ABNORMAL LOW (ref 4.22–5.81)
RDW: 15.2 % (ref 11.5–15.5)
WBC: 8.5 10*3/uL (ref 4.0–10.5)

## 2017-11-02 LAB — GLUCOSE, CAPILLARY
GLUCOSE-CAPILLARY: 192 mg/dL — AB (ref 70–99)
Glucose-Capillary: 171 mg/dL — ABNORMAL HIGH (ref 70–99)
Glucose-Capillary: 169 mg/dL — ABNORMAL HIGH (ref 70–99)
Glucose-Capillary: 199 mg/dL — ABNORMAL HIGH (ref 70–99)

## 2017-11-02 LAB — MRSA PCR SCREENING: MRSA by PCR: POSITIVE — AB

## 2017-11-02 MED ORDER — NEPRO/CARBSTEADY PO LIQD
237.0000 mL | Freq: Two times a day (BID) | ORAL | Status: DC
  Administered 2017-11-02 – 2017-11-07 (×9): 237 mL via ORAL

## 2017-11-02 MED ORDER — CHLORHEXIDINE GLUCONATE CLOTH 2 % EX PADS
6.0000 | MEDICATED_PAD | Freq: Every day | CUTANEOUS | Status: AC
  Administered 2017-11-02 – 2017-11-06 (×5): 6 via TOPICAL

## 2017-11-02 MED ORDER — RENA-VITE PO TABS
1.0000 | ORAL_TABLET | Freq: Every day | ORAL | Status: DC
  Administered 2017-11-02 – 2017-11-05 (×4): 1 via ORAL
  Filled 2017-11-02 (×6): qty 1

## 2017-11-02 MED ORDER — MUPIROCIN 2 % EX OINT
1.0000 "application " | TOPICAL_OINTMENT | Freq: Two times a day (BID) | CUTANEOUS | Status: AC
  Administered 2017-11-02 – 2017-11-06 (×10): 1 via NASAL
  Filled 2017-11-02: qty 22

## 2017-11-02 NOTE — Clinical Social Work Note (Signed)
Clinical Social Work Assessment  Patient Details  Name: Joseph Newman MRN: [id number] Date of Birth: 05/19/1945  Date of referral:  11/02/17               Reason for consult:  Facility Placement                Permission sought to share information with:  Case Manager, Customer service manager, Family Supports Permission granted to share information::  Yes, Verbal Permission Granted  Name::        Agency::     Relationship::     Contact Information:     Housing/Transportation Living arrangements for the past 2 months:  [place] of Information:  Spouse Patient Interpreter Needed:  None Criminal Activity/Legal Involvement Pertinent to Current Situation/Hospitalization:  No - Comment as needed Significant Relationships:  Spouse Lives with:  Spouse Do you feel safe going back to the place where you live?  Yes Need for family participation in patient care:  Yes (Comment)  Care giving concerns: Patient admitted from [facility].    Social Worker assessment / plan:  CSW noted in chart review that patient is from [facility] for rehab. CSW met with patient and wife Jahlil Ziller at bedside. Wife states that before last hospitalization patient was living at home with her. Per wife, patient was at [facility] for short term rehab and was scheduled to discharge from there tomorrow. Wife states that she would like patient to return home when medically stable. CSW explained that he could return to [facility] if he needed more rehab but wife states that she would like to take him home. CSW made RN CM aware of above. CSW signing off. Please reconsult if further needs arise.   Employment status:  Retired Nurse, adult PT Recommendations:  Not assessed at this time Information / Referral to community resources:     Patient/Family's Response to care:  Wife thanked CSW for assistance   Patient/Family's Understanding of and  Emotional Response to Diagnosis, Current Treatment, and Prognosis:  Wife states understanding of current condition   Emotional Assessment Appearance:  Appears stated age Attitude/Demeanor/Rapport:    Affect (typically observed):  Quiet, Flat Orientation:  Oriented to Self Alcohol / Substance use:  Not Applicable Psych involvement (Current and /or in the community):  No (Comment)  Discharge Needs  Concerns to be addressed:  Discharge Planning Concerns Readmission within the last 30 days:  Yes Current discharge risk:  Dependent with Mobility, Cognitively Impaired Barriers to Discharge:  Continued Medical Work up   [employer], [place] 11/02/2017, 10:35 AM

## 2017-11-02 NOTE — Progress Notes (Signed)
Initial Nutrition Assessment  DOCUMENTATION CODES:   Obesity unspecified  INTERVENTION:   -Nepro Shake po BID, each supplement provides 425 kcal and 19 grams protein -Renal MVI daily  NUTRITION DIAGNOSIS:   Inadequate oral intake related to decreased appetite as evidenced by meal completion < 50%, per patient/family report.  GOAL:   Patient will meet greater than or equal to 90% of their needs  MONITOR:   PO intake, Supplement acceptance, Labs, Weight trends, Skin, I & O's  REASON FOR ASSESSMENT:   Malnutrition Screening Tool    ASSESSMENT:   Joseph Newman  is a 72 y.o. male with a known history of multiple medical problems including ESRD on hemodialysis, hypertension, diabetes, hyperlipidemia, BPH, sleep apnea, gout, arthritis, etc. the patient is sent to ED from dialysis center due to altered mental status.  The patient does not know why he is sent here.  He denies any symptoms except the cough.  According to his wife, the patient is living in [place] home.  He has had cough for 4 to 5 days.  He is on hemodialysis today but was found confusion by the nurse and sent to ED.  CAT scan of the head is unremarkable.  Chest x-ray show pulmonary fibrosis or interstitial edema.  ED physician request admission.  He was admitted for CVA work-up last month but no CVA.  Pt admitted with AMS.   Pt is a resident of [facility].  Noted SLP consulted ordered, however, consulted not performed yet.   Pt sitting up in bed, preparing to eat breakfast time of visit. Pt responded to simple questions and would respond with one word answers (most "no" and "okay"). Hx obtained from wife at bedside, who describes that pt has experienced a general decline in health over the past 3-5 months. Pt has experienced a lot of changes during that time periods, including starting HD and transition to SNF for short term rehab, where he has been for the past month. Pt wife reports that pt has  had a lot of barriers to progression, include a recent gout flare-up which slowed his participation with therapies. Pt wife reports that his diet has been extremely limited due to his gout and HD; she shares that pt has been served noodles every day over the past month. She is unsure if pt typically eats breakfast, however, will consume 1/3 of meals and lunch and dinner when she is present. Pt wife reports that intake is usually less during HD days as he is "wiped out" during this time. Pt's main intake is fruit; wife is unsure if pt receives supplements, however, will occasionally provide him with strawberry Premier Protein shakes.   Pt wife unsure of wt loss, however, wt has been stable over the past year. Noted "goal weight" on HD records is 100 kg. Pt wife unsure of dry weight.   Discussed importance of good meal and supplement intake to promote healing. Pt refused Prostat, but willing to try nepro.   Last Hgb A1c: 7.3 (09/29/17). PTA DM medications are 1.5 mg dulaglutide every 7 days.   Labs reviewed: K: 2.9, CBGS: 171-181 (inpatient orders for glycemic control are 0-5 units insulin aspart q HS, 0-9 units insulin aspart TID with meals, ).   NUTRITION - FOCUSED PHYSICAL EXAM:    Most Recent Value  Orbital Region  No depletion  Upper Arm Region  No depletion  Thoracic and Lumbar Region  No depletion  Buccal Region  No depletion  Temple  Region  No depletion  Clavicle Bone Region  No depletion  Clavicle and Acromion Bone Region  No depletion  Scapular Bone Region  No depletion  Dorsal Hand  Mild depletion  Patellar Region  Mild depletion  Anterior Thigh Region  Mild depletion  Posterior Calf Region  Mild depletion  Edema (RD Assessment)  Mild  Hair  Reviewed  Eyes  Reviewed  Mouth  Reviewed  Skin  Reviewed  Nails  Reviewed       Diet Order:   Diet Order            Diet renal/carb modified with fluid restriction Diet-HS Snack? Nothing; Fluid restriction: 1200 mL Fluid; Room  service appropriate? Yes; Fluid consistency: Thin  Diet effective now              EDUCATION NEEDS:   Education needs have been addressed  Skin:  Skin Assessment: Reviewed RN Assessment  Last BM:  10/31/17  Height:   Ht Readings from Last 1 Encounters:  11/01/17 6\' 5"  (1.956 m)    Weight:   Wt Readings from Last 1 Encounters:  09/29/17 116.9 kg    Ideal Body Weight:  94.5 kg  BMI:  Body mass index is 30.57 kg/m.  Estimated Nutritional Needs:   Kcal:  2100-2300  Protein:  105-120 grams  Fluid:  1000 ml + UOP    Jahanna Raether A. Jimmye Norman, RD, LDN, CDE Pager: [phone] After hours Pager: [phone]

## 2017-11-02 NOTE — Evaluation (Signed)
Clinical/Bedside Swallow Evaluation Patient Details  Name: Joseph Newman MRN: [id number] Date of Birth: 14-May-1945  Today's Date: 11/02/2017 Time: SLP Start Time (ACUTE ONLY): 0830 SLP Stop Time (ACUTE ONLY): 0913 SLP Time Calculation (min) (ACUTE ONLY): 43 min  Past Medical History:  Past Medical History:  Diagnosis Date  . Arthritis   . Chronic airway obstruction, not elsewhere classified   . 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 Holley Raring, nephrologist  . Depressive disorder, not elsewhere classified   . Heart murmur   . Hyperlipidemia   . Hypertension   . Hypertrophy of prostate without urinary obstruction and other lower urinary tract symptoms (LUTS)   . Impotence of organic origin   . Microalbuminuria   . 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 Obesity, unspecified   . Plantar wart   . Sleep apnea   . Synovitis and tenosynovitis, unspecified   . Type II or unspecified type diabetes mellitus without mention of complication, uncontrolled   . Unspecified venous (peripheral) insufficiency    Past Surgical History:  Past Surgical History:  Procedure Laterality Date  . DIALYSIS/PERMA CATHETER INSERTION N/A 09/28/2017   Procedure: DIALYSIS/PERMA CATHETER INSERTION;  Surgeon: Katha Cabal, MD;  Location: [facility];  Service: Cardiovascular;  Laterality: N/A;  . foot surgery     HPI:  Joseph Newman  is a 72 y.o. male with a known history of multiple medical problems including ESRD on hemodialysis, hypertension, diabetes, hyperlipidemia, BPH, sleep apnea, gout, arthritis, etc. the patient is sent to ED from dialysis center due to altered mental status.  The patient does not know why he is sent here.  He denies any symptoms except the cough.  According to his wife, the patient is living in [place] home.  He has had cough for 4 to 5 days.  He is on hemodialysis today but was found confusion by the nurse and sent to ED.   CAT scan of the head is unremarkable.  Chest x-ray show pulmonary fibrosis or interstitial edema.  ED physician request admission.  He was admitted for CVA work-up last month but no CVA.     Assessment / Plan / Recommendation Clinical Impression  Per report, the patient was observed to oral hold liquids and cough after drinking.  The patient is reluctant to participate in the swallowing evaluation.  He has baseline cough with clear vocal quality.  The patient was able to swallow applesauce and pull thin liquid via straw and swallow with no overt difficulties or clinical indicators of aspiration observed.  This is a limited assessment as he would only take bites and sips and he is poorly cooperative for assessment.  Recommend continuing current diet, monitor for difficulties swallowing, and encourage oral intake.  SLP will follow as needed. SLP Visit Diagnosis: Other (comment)(Functional oropharyngeal swallowing)    Aspiration Risk  No limitations    Diet Recommendation Regular;Other (Comment)(continue current diet)   Liquid Administration via: Straw Medication Administration: Whole meds with liquid Supervision: Staff to assist with self feeding;Comment(Encourage oral intake)    Other  Recommendations     Follow up Recommendations        Frequency and Duration            Prognosis        Swallow Study   General Date of Onset: 11/01/17 HPI: Joseph Newman  is a 72 y.o. male with a known history of multiple medical problems including ESRD  on hemodialysis, hypertension, diabetes, hyperlipidemia, BPH, sleep apnea, gout, arthritis, etc. the patient is sent to ED from dialysis center due to altered mental status.  The patient does not know why he is sent here.  He denies any symptoms except the cough.  According to his wife, the patient is living in [place] home.  He has had cough for 4 to 5 days.  He is on hemodialysis today but was found confusion by the nurse and sent to ED.   CAT scan of the head is unremarkable.  Chest x-ray show pulmonary fibrosis or interstitial edema.  ED physician request admission.  He was admitted for CVA work-up last month but no CVA.   Type of Study: Bedside Swallow Evaluation Diet Prior to this Study: Regular Baseline Vocal Quality: Normal    Oral/Motor/Sensory Function Overall Oral Motor/Sensory Function: Within functional limits   Ice Chips     Thin Liquid Thin Liquid: Within functional limits Presentation: Straw(Limited number of trials 2/2 poor cooperation)    Nectar Thick     Honey Thick     Puree Puree: Within functional limits Presentation: Spoon(Limited number of trials 2/2 poor cooperation)   Solid           Oris Sea, MS/CCC- SLP  Valetta Fuller, Susie 11/02/2017,9:44 AM

## 2017-11-02 NOTE — Care Management Obs Status (Signed)
[facility] NOTIFICATION   Patient Details  Name: Joseph Newman MRN: [id number] Date of Birth: 06-Nov-1945   Medicare Observation Status Notification Given:  Yes: Explained to patient    Shelbie Ammons, RN 11/02/2017, 9:16 AM

## 2017-11-02 NOTE — Care Management Note (Addendum)
Case Management Note  Patient Details  Name: Joseph Newman MRN: [id number] Date of Birth: 10-May-1945  Subjective/Objective:  Admitted to [facility] under observation status with the diagnosis of acute metabolic encephalopathy.       Lives with wife, [name] [phone]). Sees Dr. Ancil Boozer as primary care physician. Prescriptions [facility] [place].  States he has no home health in the home. [facility] 09/2017. Currently still a resident of [facility].  No home oxygen. Cane, wheelchair, and rolling walker in the home. Self feed, states he doesn't bath, and wife helps with dressing. Last fall was 3 months ago. Decreased appetite. "I don't eat much." Peritoneal dialysis to hemodialysis 09/2017. Goes to [facility] in [place].              Action/Plan: Will continue to follow for discharge plans   Expected Discharge Date:                  Expected Discharge Plan:     In-House Referral:   yes  Discharge planning Services   yes  Post Acute Care Choice:    Choice offered to:     DME Arranged:    DME Agency:     HH Arranged:    HH Agency:     Status of Service:     If discussed at [place] of Stay Meetings, dates discussed:    Additional Comments:  Shelbie Ammons, RN MSN CCM Care Management [phone] 11/02/2017, 9:25 AM

## 2017-11-02 NOTE — Progress Notes (Signed)
Pt noted to have difficulty swallowing.  Pt holds fluids, solids in his mouth and has difficulty swallowing bolus.  Once pt swallows, he has coughing. SLP ordered. Dorna Bloom RN

## 2017-11-02 NOTE — Progress Notes (Signed)
[place] [facility] [place] NAME: Joseph Newman    MR#:  [id number]  DATE OF BIRTH:  1945/03/12  SUBJECTIVE:   Patient presented to the hospital due to altered mental status at hemodialysis.  Mental status close to baseline but remains intermittently confused here.  No other acute events overnight, no infectious source noted.  REVIEW OF SYSTEMS:    Review of Systems  Constitutional: Negative for chills and fever.  HENT: Negative for congestion and tinnitus.   Eyes: Negative for blurred vision and double vision.  Respiratory: Negative for cough, shortness of breath and wheezing.   Cardiovascular: Negative for chest pain, orthopnea and PND.  Gastrointestinal: Negative for abdominal pain, diarrhea, nausea and vomiting.  Genitourinary: Negative for dysuria and hematuria.  Neurological: Positive for weakness (generalized). Negative for dizziness, sensory change and focal weakness.  All other systems reviewed and are negative.   Nutrition: Renal/Carb modified.  Tolerating Diet: Yes Tolerating PT: Await Eval.   DRUG ALLERGIES:  No Known Allergies  VITALS:  Blood pressure (!) 144/71, pulse 87, temperature 98.2 F (36.8 C), temperature source Axillary, resp. rate 18, height 6\' 5"  (1.956 m), SpO2 90 %.  PHYSICAL EXAMINATION:   Physical Exam  GENERAL:  72 y.o.-year-old patient lying in bed lethargic/confused but follows simple commands.  EYES: Pupils equal, round, reactive to light and accommodation. No scleral icterus. Extraocular muscles intact.  HEENT: Head atraumatic, normocephalic. Oropharynx and nasopharynx clear.  NECK:  Supple, no jugular venous distention. No thyroid enlargement, no tenderness.  LUNGS: Normal breath sounds bilaterally, no wheezing, rales, rhonchi. No use of accessory muscles of respiration.  CARDIOVASCULAR: S1, S2 normal. No murmurs, rubs, or gallops.  ABDOMEN: Soft, nontender, nondistended. Bowel sounds present. No  organomegaly or mass. + PD cath in place.  EXTREMITIES: No cyanosis, clubbing or edema b/l.    NEUROLOGIC: Cranial nerves II through XII are intact. No focal Motor or sensory deficits b/l.  Globally weak.  PSYCHIATRIC: The patient is alert and oriented x 1.  SKIN: No obvious rash, lesion, or ulcer.   Right chest wall PermCath in place  LABORATORY PANEL:   CBC Recent Labs  Lab 11/02/17 0546  WBC 8.5  HGB 9.4*  HCT 28.4*  PLT 205   ------------------------------------------------------------------------------------------------------------------  Chemistries  Recent Labs  Lab 11/01/17 1542 11/02/17 0546  NA 139 138  K 3.0* 2.9*  CL 100 101  CO2 30 28  GLUCOSE 149* 191*  BUN 15 21  CREATININE 1.85* 2.58*  CALCIUM 8.4* 8.1*  AST 14*  --   ALT 10  --   ALKPHOS 71  --   BILITOT 0.7  --    ------------------------------------------------------------------------------------------------------------------  Cardiac Enzymes Recent Labs  Lab 11/01/17 1542  TROPONINI 0.06*   ------------------------------------------------------------------------------------------------------------------  RADIOLOGY:  Dg Chest 2 View  Result Date: 11/01/2017 CLINICAL DATA:  Altered mental status.  Renal failure EXAM: CHEST - 2 VIEW COMPARISON:  September 28, 2017 FINDINGS: There is diffuse interstitial fibrosis; there may be a degree of superimposed chronic interstitial edema. There is no consolidation. Heart is mildly enlarged with pulmonary vascularity normal. No adenopathy. There is aortic atherosclerosis. Central catheter tip is in the superior vena cava near the cavoatrial junction. No appreciable bone lesions. IMPRESSION: Diffuse pulmonary fibrosis. There may be a degree of superimposed chronic interstitial edema. There is no consolidation. There is cardiomegaly with pulmonary vascularity normal. There is aortic atherosclerosis. Aortic Atherosclerosis (ICD10-I70.0). Electronically Signed    By: Gwyndolyn Saxon  Jasmine December III M.D.   On: 11/01/2017 16:23   Ct Head Wo Contrast  Result Date: 11/01/2017 CLINICAL DATA:  72 year old male with altered mental status. Patient on dialysis. EXAM: CT HEAD WITHOUT CONTRAST TECHNIQUE: Contiguous axial images were obtained from the base of the skull through the vertex without intravenous contrast. COMPARISON:  09/25/2017 CT and MR FINDINGS: Brain: No evidence of acute infarction, hemorrhage, hydrocephalus, extra-axial collection or mass lesion/mass effect. Atrophy and chronic small-vessel white matter ischemic changes noted. Vascular: Vertebral and carotid atherosclerotic calcifications again noted. Skull: Normal. Negative for fracture or focal lesion. Sinuses/Orbits: Near complete opacification of LEFT frontal, anterior ethmoid and LEFT maxillary sinuses noted. Fluid in the LEFT frontal sinus noted. Other: None IMPRESSION: 1. No evidence of acute intracranial abnormality 2. Atrophy and chronic small-vessel white matter ischemic changes 3. LEFT paranasal sinusitis, with acute component/fluid in the LEFT frontal sinus. Electronically Signed   By: Margarette Canada M.D.   On: 11/01/2017 16:03     ASSESSMENT AND PLAN:   72 year old male with past medical history of end-stage renal disease on hemodialysis, diabetes, history of gout, essential hypertension, hyperlipidemia, obstructive sleep apnea who presents to the hospital due to altered mental status from dialysis.  1.  Altered mental status- etiology unclear.  Patient apparently was somewhat lethargic and confused at dialysis and therefore sent to the hospital.  CT head has been negative, no acute infectious source.  Mental status has improved. - I suspect this is probably due to mild underlying dementia as the wife says this happens intermittently at home.  2.  End-stage renal disease on hemodialysis- patient used to be on peritoneal dialysis but has been converted to hemodialysis because he was discharged to a  skilled nursing facility and he could not do peritoneal dialysis.  Patient's wife wants to switch him back to peritoneal dialysis but this is not possible as patient is quite deconditioned and weak. -I will let nephrology further address this with the patient's wife, continue dialysis on a Tuesday Thursday Saturday schedule for now.  3.  Hypokalemia-we will not supplement potassium but should correct with CKD and patient being on dialysis.  4.  Essential hypertension- continue Norvasc, Avapro, Coreg.   5.  Hyperlipidemia-continue atorvastatin.  6.  Depression-continue Celexa.  7.  History of gout-no acute attack.  Continue allopurinol.  8.  Secondary hyperparathyroidism -  Cont. Rocaltrol     All the records are reviewed and case discussed with Care Management/Social Worker. Management plans discussed with the patient, family and they are in agreement.  CODE STATUS: DNR  DVT Prophylaxis: Hep SQ  TOTAL TIME TAKING CARE OF THIS PATIENT: 30 minutes.   POSSIBLE D/C IN 1-2 DAYS, DEPENDING ON CLINICAL CONDITION.   Henreitta Leber M.D on 11/02/2017 at 1:51 PM  Between 7am to 6pm - Pager - [phone]  After 6pm go to [email or web address] - Technical brewer [facility]  Office  [phone]  CC: Primary care physician; Steele Sizer, MD

## 2017-11-02 NOTE — Progress Notes (Signed)
[facility]  ROUNDING NOTE   Subjective:   Patient more awake.  Wife at bedside.    Objective:  Vital signs in last 24 hours:  Temp:  [98 F (36.7 C)-98.2 F (36.8 C)] 98.2 F (36.8 C) (10/11 1140) Pulse Rate:  [83-87] 87 (10/11 1140) Resp:  [18] 18 (10/11 0407) BP: (137-186)/(69-79) 144/71 (10/11 1140) SpO2:  [90 %-98 %] 98 % (10/11 1140)  Weight change:  There were no vitals filed for this visit.  Intake/Output: I/O last 3 completed shifts: In: 230 [P.O.:230] Out: -    Intake/Output this shift:  No intake/output data recorded.  Physical Exam: General: NAD, laying in bed  Head: Normocephalic, atraumatic. Moist oral mucosal membranes  Eyes: Anicteric, PERRL  Neck: Supple, trachea midline  Lungs:  Clear to auscultation  Heart: Regular rate and rhythm  Abdomen:  Soft, nontender,   Extremities: no peripheral edema.  Neurologic: Alert to self only  Skin: No lesions  Access: RIJ permcath, PD catheter    Basic Metabolic Panel: Recent Labs  Lab 11/01/17 1542 11/02/17 0546  NA 139 138  K 3.0* 2.9*  CL 100 101  CO2 30 28  GLUCOSE 149* 191*  BUN 15 21  CREATININE 1.85* 2.58*  CALCIUM 8.4* 8.1*    Liver Function Tests: Recent Labs  Lab 11/01/17 1542  AST 14*  ALT 10  ALKPHOS 71  BILITOT 0.7  PROT 6.8  ALBUMIN 2.5*   No results for input(s): LIPASE, AMYLASE in the last 168 hours. No results for input(s): AMMONIA in the last 168 hours.  CBC: Recent Labs  Lab 11/01/17 1542 11/02/17 0546  WBC 10.1 8.5  HGB 10.1* 9.4*  HCT 30.2* 28.4*  MCV 83.0 82.6  PLT 223 205    Cardiac Enzymes: Recent Labs  Lab 11/01/17 1542  TROPONINI 0.06*    BNP: Invalid input(s): POCBNP  CBG: Recent Labs  Lab 11/01/17 1505 11/01/17 2041 11/02/17 0804 11/02/17 1152  GLUCAP 116* 181* 171* 192*    Microbiology: Results for orders placed or performed during the hospital encounter of 11/01/17  MRSA PCR Screening     Status: Abnormal   Collection Time: 11/02/17  3:06 AM  Result Value Ref Range Status   MRSA by PCR POSITIVE (A) NEGATIVE Final    Comment:        The GeneXpert MRSA Assay (FDA approved for NASAL specimens only), is one component of a comprehensive MRSA colonization surveillance program. It is not intended to diagnose MRSA infection nor to guide or monitor treatment for MRSA infections. RESULT CALLED TO, READ BACK BY AND VERIFIED WITH: C/SARA [facility] @0510  11/02/17 [facility] Performed at [facility], [address] [place], [place] [address]     Coagulation Studies: No results for input(s): LABPROT, INR in the last 72 hours.  Urinalysis: No results for input(s): COLORURINE, LABSPEC, PHURINE, GLUCOSEU, HGBUR, BILIRUBINUR, KETONESUR, PROTEINUR, UROBILINOGEN, NITRITE, LEUKOCYTESUR in the last 72 hours.  Invalid input(s): APPERANCEUR    Imaging: Dg Chest 2 View  Result Date: 11/01/2017 CLINICAL DATA:  Altered mental status.  Renal failure EXAM: CHEST - 2 VIEW COMPARISON:  September 28, 2017 FINDINGS: There is diffuse interstitial fibrosis; there may be a degree of superimposed chronic interstitial edema. There is no consolidation. Heart is mildly enlarged with pulmonary vascularity normal. No adenopathy. There is aortic atherosclerosis. Central catheter tip is in the superior vena cava near the cavoatrial junction. No appreciable bone lesions. IMPRESSION: Diffuse pulmonary fibrosis. There may be a degree of superimposed  chronic interstitial edema. There is no consolidation. There is cardiomegaly with pulmonary vascularity normal. There is aortic atherosclerosis. Aortic Atherosclerosis (ICD10-I70.0). Electronically Signed   By: Lowella Grip III M.D.   On: 11/01/2017 16:23   Ct Head Wo Contrast  Result Date: 11/01/2017 CLINICAL DATA:  72 year old male with altered mental status. Patient on dialysis. EXAM: CT HEAD WITHOUT CONTRAST TECHNIQUE: Contiguous axial images were obtained from the base of  the skull through the vertex without intravenous contrast. COMPARISON:  09/25/2017 CT and MR FINDINGS: Brain: No evidence of acute infarction, hemorrhage, hydrocephalus, extra-axial collection or mass lesion/mass effect. Atrophy and chronic small-vessel white matter ischemic changes noted. Vascular: Vertebral and carotid atherosclerotic calcifications again noted. Skull: Normal. Negative for fracture or focal lesion. Sinuses/Orbits: Near complete opacification of LEFT frontal, anterior ethmoid and LEFT maxillary sinuses noted. Fluid in the LEFT frontal sinus noted. Other: None IMPRESSION: 1. No evidence of acute intracranial abnormality 2. Atrophy and chronic small-vessel white matter ischemic changes 3. LEFT paranasal sinusitis, with acute component/fluid in the LEFT frontal sinus. Electronically Signed   By: Margarette Canada M.D.   On: 11/01/2017 16:03     Medications:   . sodium chloride     . allopurinol  50 mg Oral QODAY  . irbesartan  300 mg Oral Daily   And  . amLODipine  10 mg Oral Daily  . aspirin EC  81 mg Oral Daily  . atorvastatin  40 mg Oral Daily  . azithromycin  250 mg Oral Daily  . calcitRIOL  0.25 mcg Oral Daily  . carvedilol  6.25 mg Oral BID WC  . Chlorhexidine Gluconate Cloth  6 each Topical Q0600  . cholecalciferol  2,000 Units Oral Daily  . citalopram  10 mg Oral Daily  . docusate sodium  100 mg Oral BID  . feeding supplement (NEPRO CARB STEADY)  237 mL Oral BID BM  . heparin  5,000 Units Subcutaneous Q8H  . Influenza vac split quadrivalent PF  0.5 mL Intramuscular Tomorrow-1000  . insulin aspart  0-5 Units Subcutaneous QHS  . insulin aspart  0-9 Units Subcutaneous TID WC  . multivitamin  1 tablet Oral QHS  . mupirocin ointment  1 application Nasal BID  . sodium bicarbonate  1,300 mg Oral BID  . sodium chloride flush  3 mL Intravenous Q12H  . tamsulosin  0.4 mg Oral QPC breakfast  . vitamin B-12  1,000 mcg Oral Daily   sodium chloride, acetaminophen **OR**  acetaminophen, albuterol, bisacodyl, guaiFENesin-dextromethorphan, hydrALAZINE, HYDROcodone-acetaminophen, ondansetron **OR** ondansetron (ZOFRAN) IV, senna-docusate, sodium chloride flush  Assessment/ Plan:  [name] is a 72 y.o. black male with end stage renal disease on dialysis, COPD, arthritis, depression, sleep apnea, diabetes mellitus type 2 with neuropathy, hypertension,  and gout  Currently on back up hemodialysis [facility] permcath  Peritoneal dialysis prescription 107 kg/ 4 x 3000 mL, last fill 2000 mL  1.  End-stage renal disease: did not complete full hemodialysis treatment yesterday. However with no acute indication to complete treatment.  Monitor hemodialysis treatment.  - Next treatment for tomorrow, Orders prepared. 3K bath  2. Hypertension: better control.  - resume home carvedilol, tamsulosin, irbesartan - Discontinued hydrochlorothiazide due to renal insufficiency and gout (hyperuricemia) - PRN IV hydralazine ordered.   3. Altered mental status: CT negative. Unclear of cause. Could be medication induced.   4. Anemia of chronic kidney disease:   - EPO with HD treatment.   5. Secondary Hyperparathyroidism: outpatient PTH 156,  phosphorus and calcium at goal - not currently on binders.   6. Gout: not in acute flare.  - allopurinol    LOS: 0 Bianca Vester 10/11/20194:47 PM

## 2017-11-03 LAB — BASIC METABOLIC PANEL
Anion gap: 10 (ref 5–15)
BUN: 28 mg/dL — AB (ref 8–23)
CALCIUM: 8.4 mg/dL — AB (ref 8.9–10.3)
CO2: 27 mmol/L (ref 22–32)
CREATININE: 3.48 mg/dL — AB (ref 0.61–1.24)
Chloride: 100 mmol/L (ref 98–111)
GFR calc Af Amer: 18 mL/min — ABNORMAL LOW (ref >60)
GFR calc non Af Amer: 16 mL/min — ABNORMAL LOW (ref >60)
GLUCOSE: 190 mg/dL — AB (ref 70–99)
Potassium: 2.9 mmol/L — ABNORMAL LOW (ref 3.5–5.1)
Sodium: 137 mmol/L (ref 135–145)

## 2017-11-03 LAB — GLUCOSE, CAPILLARY
Glucose-Capillary: 178 mg/dL — ABNORMAL HIGH (ref 70–99)
Glucose-Capillary: 169 mg/dL — ABNORMAL HIGH (ref 70–99)
Glucose-Capillary: 215 mg/dL — ABNORMAL HIGH (ref 70–99)

## 2017-11-03 MED ORDER — FLUTICASONE PROPIONATE 50 MCG/ACT NA SUSP
2.0000 | Freq: Every day | NASAL | Status: DC
  Administered 2017-11-03 – 2017-11-07 (×5): 2 via NASAL
  Filled 2017-11-03: qty 16

## 2017-11-03 MED ORDER — AMOXICILLIN-POT CLAVULANATE 500-125 MG PO TABS
1.0000 | ORAL_TABLET | Freq: Every day | ORAL | Status: DC
  Administered 2017-11-03 – 2017-11-04 (×2): 500 mg via ORAL
  Filled 2017-11-03 (×2): qty 1

## 2017-11-03 NOTE — Progress Notes (Signed)
   11/03/17 1915  What Happened  Was fall witnessed? No  Was patient injured? No  Patient found on floor;other (Comment) (both feet on bed; pt lying on back slightly on left side )  Found by Staff-comment (making rounds)  Stated prior activity other (comment) (bed bound)  Follow Up  MD notified Gabriel Rainwater  Time MD notified [place] notified Yes-comment (wife)  Time family notified 1930  Additional tests No  Simple treatment Other (comment) (none indicated at this time. )  Adult Fall Risk Assessment  Risk Factor Category (scoring not indicated) Fall has occurred during this admission (document High fall risk)  Age 72  Fall History: Fall within 6 months prior to admission 5  Elimination; Bowel and/or Urine Incontinence 2  Elimination; Bowel and/or Urine Urgency/Frequency 2  Medications: includes PCA/Opiates, Anti-convulsants, Anti-hypertensives, Diuretics, Hypnotics, Laxatives, Sedatives, and Psychotropics 3  Patient Care Equipment 1  Mobility-Assistance 2  Mobility-Gait 2  Mobility-Sensory Deficit 0  Altered awareness of immediate physical environment 1  Impulsiveness 2  Lack of understanding of one's physical/cognitive limitations 4  Total Score 26  Patient Fall Risk Level High fall risk  Adult Fall Risk Interventions  Required Bundle Interventions *See Row Information* High fall risk - low, moderate, and high requirements implemented  Screening for Fall Injury Risk (To be completed on HIGH fall risk patients) - Assessing Need for Low Bed  Risk For Fall Injury- Low Bed Criteria Previous fall this admission  Will Implement Low Bed and Floor Mats Yes (requested low bed for pt. )  Neurological  Neuro (WDL) X  Level of Consciousness Alert  Orientation Level Oriented to person;Disoriented to place;Disoriented to time;Disoriented to situation  Architect awareness;Memory impairment  Air cabin crew Coma Scale  Eye Opening 4  Best Verbal Response (NON-intubated) 4  Best Motor Response 6  Glasgow Coma Scale Score 14  Musculoskeletal  Musculoskeletal (WDL) X  Generalized Weakness Yes  Integumentary  Integumentary (WDL) X  RN Assisting with Skin Assessment on Admission Yalanda, RN  Skin Color Appropriate for ethnicity  Skin Condition Dry  Skin Integrity Ecchymosis  Ecchymosis Location Elbow  Ecchymosis Location Orientation Left

## 2017-11-03 NOTE — Progress Notes (Addendum)
Called to room with Joseph Newman NT finding pt lying on floor when started night shift rounds. Pt was lying on his back slightly to left side on right side of bed with both his feet still on mattress. Pt states, " I just rolled over"; " I just moved over in the bed"; "I must have just slid". VSS. Pt denies injury- primary and secondary assessment negative for injury or skin changes. Pt was lifted with blanket x 3 persons back to bed. Pt assessed again with no co's/injuries noted. MD notified with no new orders; notify MD if any changes or concerns identified. TC with wife and informed and.stated repeatedly that " 3 lights were solid" at the foot of the bed when she left. Wife states she will come in to see pt.  Adriana, NT states she had just performed pt care and pt was resting quietly in the be. Pt had made to attempt this shift to get OOB; he did however resist turning to his right side and would turn back to his preferred position on left side. Pt resting quietly in bed. Report completed with [facility] RN for fall FU.

## 2017-11-03 NOTE — Progress Notes (Signed)
This note also relates to the following rows which could not be included: Pulse Rate - Cannot attach notes to unvalidated device data Resp - Cannot attach notes to unvalidated device data  Hd started  

## 2017-11-03 NOTE — Progress Notes (Signed)
Pre dialysis assessment 

## 2017-11-03 NOTE — Progress Notes (Signed)
Patient ID: Joseph Newman, male   DOB: February 03, 1945, 72 y.o.   MRN: [id number]  [facility] PROGRESS NOTE  Ibrahim Mcpheeters XBL:[id number] DOB: [birth year] DOA: 11/01/2017 PCP: Steele Sizer, MD  HPI/Subjective: Patient feels okay.  Coughed a few times while is in the room.  Offers no complaints.  Wife states he has a poor appetite.  Admits to coughing when I saw him coughing and asked him about it  Objective: Vitals:   11/03/17 1415 11/03/17 1426  BP: (!) 152/78 (!) 151/75  Pulse: 83 84  Resp: (!) 27 (!) 24  Temp:  98 F (36.7 C)  SpO2:  94%    There were no vitals filed for this visit.  ROS: Review of Systems  Constitutional: Negative for chills and fever.  Eyes: Negative for blurred vision.  Respiratory: Positive for cough. Negative for shortness of breath.   Cardiovascular: Negative for chest pain.  Gastrointestinal: Negative for abdominal pain, constipation, diarrhea, nausea and vomiting.  Genitourinary: Negative for dysuria.  Musculoskeletal: Negative for joint pain.  Neurological: Negative for dizziness and headaches.   Exam: Physical Exam  HENT:  Nose: No mucosal edema.  Mouth/Throat: No oropharyngeal exudate or posterior oropharyngeal edema.  Eyes: Pupils are equal, round, and reactive to light. Conjunctivae, EOM and lids are normal.  Neck: No JVD present. Carotid bruit is not present. No edema present. No thyroid mass and no thyromegaly present.  Cardiovascular: S1 normal and S2 normal. Exam reveals no gallop.  No murmur heard. Pulses:      Dorsalis pedis pulses are 2+ on the right side, and 2+ on the left side.  Respiratory: No respiratory distress. He has decreased breath sounds in the right lower field and the left lower field. He has no wheezes. He has no rhonchi. He has no rales.  GI: Soft. Bowel sounds are normal. There is no tenderness.  Musculoskeletal:       Right ankle: He exhibits no swelling.       Left ankle: He exhibits no swelling.  Lymphadenopathy:    He has no cervical adenopathy.  Neurological: He is alert. No cranial nerve deficit.  Skin: Skin is warm. No rash noted. Nails show no clubbing.  Psychiatric: He has a normal mood and affect.      Data Reviewed: Basic Metabolic Panel: Recent Labs  Lab 11/01/17 1542 11/02/17 0546 11/03/17 0525  NA 139 138 137  K 3.0* 2.9* 2.9*  CL 100 101 100  CO2 30 28 27   GLUCOSE 149* 191* 190*  BUN 15 21 28*  CREATININE 1.85* 2.58* 3.48*  CALCIUM 8.4* 8.1* 8.4*   Liver Function Tests: Recent Labs  Lab 11/01/17 1542  AST 14*  ALT 10  ALKPHOS 71  BILITOT 0.7  PROT 6.8  ALBUMIN 2.5*   CBC: Recent Labs  Lab 11/01/17 1542 11/02/17 0546  WBC 10.1 8.5  HGB 10.1* 9.4*  HCT 30.2* 28.4*  MCV 83.0 82.6  PLT 223 205   Cardiac Enzymes: Recent Labs  Lab 11/01/17 1542  TROPONINI 0.06*    CBG: Recent Labs  Lab 11/02/17 0804 11/02/17 1152 11/02/17 1826 11/02/17 2019 11/03/17 0813  GLUCAP 171* 192* 169* 199* 178*    Recent Results (from the past 240 hour(s))  MRSA PCR Screening     Status: Abnormal   Collection Time: 11/02/17  3:06 AM  Result Value Ref Range Status   MRSA by PCR POSITIVE (A) NEGATIVE Final    Comment:        The GeneXpert  MRSA Assay (FDA approved for NASAL specimens only), is one component of a comprehensive MRSA colonization surveillance program. It is not intended to diagnose MRSA infection nor to guide or monitor treatment for MRSA infections. RESULT CALLED TO, READ BACK BY AND VERIFIED WITH: C/SARA [facility] @0510  11/02/17 [facility] Performed at [facility], [address] [place], [place] [address]      Studies: Dg Chest 2 View  Result Date: 11/01/2017 CLINICAL DATA:  Altered mental status.  Renal failure EXAM: CHEST - 2 VIEW COMPARISON:  September 28, 2017 FINDINGS: There is diffuse interstitial fibrosis; there may be a degree of superimposed chronic interstitial edema. There is no consolidation. Heart is mildly enlarged with  pulmonary vascularity normal. No adenopathy. There is aortic atherosclerosis. Central catheter tip is in the superior vena cava near the cavoatrial junction. No appreciable bone lesions. IMPRESSION: Diffuse pulmonary fibrosis. There may be a degree of superimposed chronic interstitial edema. There is no consolidation. There is cardiomegaly with pulmonary vascularity normal. There is aortic atherosclerosis. Aortic Atherosclerosis (ICD10-I70.0). Electronically Signed   By: Lowella Grip III M.D.   On: 11/01/2017 16:23   Ct Head Wo Contrast  Result Date: 11/01/2017 CLINICAL DATA:  72 year old male with altered mental status. Patient on dialysis. EXAM: CT HEAD WITHOUT CONTRAST TECHNIQUE: Contiguous axial images were obtained from the base of the skull through the vertex without intravenous contrast. COMPARISON:  09/25/2017 CT and MR FINDINGS: Brain: No evidence of acute infarction, hemorrhage, hydrocephalus, extra-axial collection or mass lesion/mass effect. Atrophy and chronic small-vessel white matter ischemic changes noted. Vascular: Vertebral and carotid atherosclerotic calcifications again noted. Skull: Normal. Negative for fracture or focal lesion. Sinuses/Orbits: Near complete opacification of LEFT frontal, anterior ethmoid and LEFT maxillary sinuses noted. Fluid in the LEFT frontal sinus noted. Other: None IMPRESSION: 1. No evidence of acute intracranial abnormality 2. Atrophy and chronic small-vessel white matter ischemic changes 3. LEFT paranasal sinusitis, with acute component/fluid in the LEFT frontal sinus. Electronically Signed   By: Margarette Canada M.D.   On: 11/01/2017 16:03    Scheduled Meds: . allopurinol  50 mg Oral QODAY  . irbesartan  300 mg Oral Daily   And  . amLODipine  10 mg Oral Daily  . aspirin EC  81 mg Oral Daily  . atorvastatin  40 mg Oral Daily  . azithromycin  250 mg Oral Daily  . calcitRIOL  0.25 mcg Oral Daily  . carvedilol  6.25 mg Oral BID WC  . Chlorhexidine  Gluconate Cloth  6 each Topical Q0600  . cholecalciferol  2,000 Units Oral Daily  . citalopram  10 mg Oral Daily  . docusate sodium  100 mg Oral BID  . feeding supplement (NEPRO CARB STEADY)  237 mL Oral BID BM  . fluticasone  2 spray Each Nare Daily  . heparin  5,000 Units Subcutaneous Q8H  . insulin aspart  0-5 Units Subcutaneous QHS  . insulin aspart  0-9 Units Subcutaneous TID WC  . multivitamin  1 tablet Oral QHS  . mupirocin ointment  1 application Nasal BID  . sodium chloride flush  3 mL Intravenous Q12H  . tamsulosin  0.4 mg Oral QPC breakfast  . vitamin B-12  1,000 mcg Oral Daily   Continuous Infusions: . sodium chloride      Assessment/Plan:  1. Acute encephalopathy.  Seems better. 2. End-stage renal disease.  Patient's wife wants to go back on peritoneal dialysis and take some home.  Currently on hemodialysis at this point.  Nephrology  will have to make this decision. 3. Weakness.  Physical therapy evaluation. 4. Hypokalemia.  Will not replace as have dialysis replaced. 5. Essential hypertension on Norvasc Avapro and Coreg 6. Hyperlipidemia unspecified on atorvastatin 7. Depression on Celexa 8. History of gout on allopurinol 9. Sinusitis.  Put on Augmentin 10. Cough.  Start Flonase if this is postnasal drip.  Chest x-ray showing diffuse pulmonary fibrosis and chronic interstitial edema  Code Status:     Code Status Orders  (From admission, onward)         Start     Ordered   11/01/17 1836  Do not attempt resuscitation (DNR)  Continuous    Question Answer Comment  In the event of cardiac or respiratory ARREST Do not call a "code blue"   In the event of cardiac or respiratory ARREST Do not perform Intubation, CPR, defibrillation or ACLS   In the event of cardiac or respiratory ARREST Use medication by any route, position, wound care, and other measures to relive pain and suffering. May use oxygen, suction and manual treatment of airway obstruction as needed for  comfort.      11/01/17 1835        Code Status History    Date Active Date Inactive Code Status Order ID Comments User Context   09/25/2017 1616 09/29/2017 2043 Full Code [id number]  Epifanio Lesches, MD ED    Advance Directive Documentation     Most Recent Value  Type of Advance Directive  Healthcare Power of Attorney, Living will  Pre-existing out of facility DNR order (yellow form or pink MOST form)  -  "MOST" Form in Place?  -     Family Communication: Wife at the bedside Disposition Plan: To be determined  Consultants:  Nephrology  Antibiotics:  Augmentin  Time spent: 28 minutes  [place]

## 2017-11-03 NOTE — Progress Notes (Signed)
This note also relates to the following rows which could not be included: Pulse Rate - Cannot attach notes to unvalidated device data Resp - Cannot attach notes to unvalidated device data  Hd completed  

## 2017-11-03 NOTE — Progress Notes (Signed)
Pt has more frequent loose, congested, weak cough which is nonproductive. Azithromycin po continued. Afebrile. Tolerated HD with meds updated on return to floor. Wife at bedside with update and education done. Pt had large loose stool.

## 2017-11-03 NOTE — Progress Notes (Signed)
Post dialysis assessment 

## 2017-11-03 NOTE — Progress Notes (Signed)
Pd Catheter dressing cleaned post Hemodialysis treatment.

## 2017-11-03 NOTE — Progress Notes (Signed)
[facility]  ROUNDING NOTE   Subjective:   Patient seen during dialysis Tolerating well   HEMODIALYSIS FLOWSHEET:  Blood Flow Rate (mL/min): 350 mL/min Arterial Pressure (mmHg): -240 mmHg Venous Pressure (mmHg): 130 mmHg Transmembrane Pressure (mmHg): 60 mmHg Ultrafiltration Rate (mL/min): 400 mL/min Dialysate Flow Rate (mL/min): 600 ml/min Conductivity: Machine : 14.2 Conductivity: Machine : 14.2 Dialysis Fluid Bolus: Normal Saline Bolus Amount (mL): 250 mL Dialysate Change: 4K(change dialysate to 4K . VO  Dr Candiss Norse)     Objective:  Vital signs in last 24 hours:  Temp:  [98.1 F (36.7 C)-98.2 F (36.8 C)] 98.2 F (36.8 C) (10/12 1055) Pulse Rate:  [86-121] 87 (10/12 1130) Resp:  [16-33] 22 (10/12 1130) BP: (122-133)/(62-78) 127/68 (10/12 1130) SpO2:  [88 %-96 %] 96 % (10/12 0401)  Weight change:  There were no vitals filed for this visit.  Intake/Output: I/O last 3 completed shifts: In: 230 [P.O.:230] Out: -    Intake/Output this shift:  No intake/output data recorded.  Physical Exam: General: NAD, laying in bed  Head: Normocephalic, atraumatic. Moist oral mucosal membranes  Eyes: Anicteric,   Neck: Supple,   Lungs:  Clear to auscultation  Heart: Regular rate and rhythm  Abdomen:  Soft, nontender,   Extremities: no peripheral edema.  Neurologic: Alert; able to answer questions. Not oriented to time or place  Skin: No lesions  Access: RIJ permcath, PD catheter    Basic Metabolic Panel: Recent Labs  Lab 11/01/17 1542 11/02/17 0546 11/03/17 0525  NA 139 138 137  K 3.0* 2.9* 2.9*  CL 100 101 100  CO2 30 28 27   GLUCOSE 149* 191* 190*  BUN 15 21 28*  CREATININE 1.85* 2.58* 3.48*  CALCIUM 8.4* 8.1* 8.4*    Liver Function Tests: Recent Labs  Lab 11/01/17 1542  AST 14*  ALT 10  ALKPHOS 71  BILITOT 0.7  PROT 6.8  ALBUMIN 2.5*   No results for input(s): LIPASE, AMYLASE in the last 168 hours. No results for input(s): AMMONIA  in the last 168 hours.  CBC: Recent Labs  Lab 11/01/17 1542 11/02/17 0546  WBC 10.1 8.5  HGB 10.1* 9.4*  HCT 30.2* 28.4*  MCV 83.0 82.6  PLT 223 205    Cardiac Enzymes: Recent Labs  Lab 11/01/17 1542  TROPONINI 0.06*    BNP: Invalid input(s): POCBNP  CBG: Recent Labs  Lab 11/02/17 0804 11/02/17 1152 11/02/17 1826 11/02/17 2019 11/03/17 0813  GLUCAP 171* 192* 169* 199* 57*    Microbiology: Results for orders placed or performed during the hospital encounter of 11/01/17  MRSA PCR Screening     Status: Abnormal   Collection Time: 11/02/17  3:06 AM  Result Value Ref Range Status   MRSA by PCR POSITIVE (A) NEGATIVE Final    Comment:        The GeneXpert MRSA Assay (FDA approved for NASAL specimens only), is one component of a comprehensive MRSA colonization surveillance program. It is not intended to diagnose MRSA infection nor to guide or monitor treatment for MRSA infections. RESULT CALLED TO, READ BACK BY AND VERIFIED WITH: C/SARA [facility] @0510  11/02/17 [facility] Performed at [facility], [address] [place], [place] [address]     Coagulation Studies: No results for input(s): LABPROT, INR in the last 72 hours.  Urinalysis: No results for input(s): COLORURINE, LABSPEC, PHURINE, GLUCOSEU, HGBUR, BILIRUBINUR, KETONESUR, PROTEINUR, UROBILINOGEN, NITRITE, LEUKOCYTESUR in the last 72 hours.  Invalid input(s): APPERANCEUR    Imaging: Dg Chest 2  View  Result Date: 11/01/2017 CLINICAL DATA:  Altered mental status.  Renal failure EXAM: CHEST - 2 VIEW COMPARISON:  September 28, 2017 FINDINGS: There is diffuse interstitial fibrosis; there may be a degree of superimposed chronic interstitial edema. There is no consolidation. Heart is mildly enlarged with pulmonary vascularity normal. No adenopathy. There is aortic atherosclerosis. Central catheter tip is in the superior vena cava near the cavoatrial junction. No appreciable bone lesions. IMPRESSION:  Diffuse pulmonary fibrosis. There may be a degree of superimposed chronic interstitial edema. There is no consolidation. There is cardiomegaly with pulmonary vascularity normal. There is aortic atherosclerosis. Aortic Atherosclerosis (ICD10-I70.0). Electronically Signed   By: Lowella Grip III M.D.   On: 11/01/2017 16:23   Ct Head Wo Contrast  Result Date: 11/01/2017 CLINICAL DATA:  72 year old male with altered mental status. Patient on dialysis. EXAM: CT HEAD WITHOUT CONTRAST TECHNIQUE: Contiguous axial images were obtained from the base of the skull through the vertex without intravenous contrast. COMPARISON:  09/25/2017 CT and MR FINDINGS: Brain: No evidence of acute infarction, hemorrhage, hydrocephalus, extra-axial collection or mass lesion/mass effect. Atrophy and chronic small-vessel white matter ischemic changes noted. Vascular: Vertebral and carotid atherosclerotic calcifications again noted. Skull: Normal. Negative for fracture or focal lesion. Sinuses/Orbits: Near complete opacification of LEFT frontal, anterior ethmoid and LEFT maxillary sinuses noted. Fluid in the LEFT frontal sinus noted. Other: None IMPRESSION: 1. No evidence of acute intracranial abnormality 2. Atrophy and chronic small-vessel white matter ischemic changes 3. LEFT paranasal sinusitis, with acute component/fluid in the LEFT frontal sinus. Electronically Signed   By: Margarette Canada M.D.   On: 11/01/2017 16:03     Medications:   . sodium chloride     . allopurinol  50 mg Oral QODAY  . irbesartan  300 mg Oral Daily   And  . amLODipine  10 mg Oral Daily  . aspirin EC  81 mg Oral Daily  . atorvastatin  40 mg Oral Daily  . azithromycin  250 mg Oral Daily  . calcitRIOL  0.25 mcg Oral Daily  . carvedilol  6.25 mg Oral BID WC  . Chlorhexidine Gluconate Cloth  6 each Topical Q0600  . cholecalciferol  2,000 Units Oral Daily  . citalopram  10 mg Oral Daily  . docusate sodium  100 mg Oral BID  . feeding supplement  (NEPRO CARB STEADY)  237 mL Oral BID BM  . heparin  5,000 Units Subcutaneous Q8H  . insulin aspart  0-5 Units Subcutaneous QHS  . insulin aspart  0-9 Units Subcutaneous TID WC  . multivitamin  1 tablet Oral QHS  . mupirocin ointment  1 application Nasal BID  . sodium bicarbonate  1,300 mg Oral BID  . sodium chloride flush  3 mL Intravenous Q12H  . tamsulosin  0.4 mg Oral QPC breakfast  . vitamin B-12  1,000 mcg Oral Daily   sodium chloride, acetaminophen **OR** acetaminophen, albuterol, bisacodyl, guaiFENesin-dextromethorphan, hydrALAZINE, HYDROcodone-acetaminophen, ondansetron **OR** ondansetron (ZOFRAN) IV, senna-docusate, sodium chloride flush  Assessment/ Plan:  [name] is a 72 y.o. black male with end stage renal disease on dialysis, COPD, arthritis, depression, sleep apnea, diabetes mellitus type 2 with neuropathy, hypertension,  and gout  Currently on back up hemodialysis [facility] permcath  Peritoneal dialysis prescription 107 kg/ 4 x 3000 mL, last fill 2000 mL  1.  End-stage renal disease:   - Patient seen during dialysis Tolerating well  - 4 K dialysate - may stop sodium bicarbonate  2.  Altered mental status: CT negative. Unclear of cause. Could be medication induced.   3. Anemia of chronic kidney disease:   - EPO with HD treatment.   4. Secondary Hyperparathyroidism: outpatient PTH 156, phosphorus and calcium at goal - not currently on binders.   6. Gout: not in acute flare.  - allopurinol   7. MRSA screen + 11/02/17   LOS: 0 Masiah Woody 10/12/201911:40 AM

## 2017-11-04 ENCOUNTER — Inpatient Hospital Stay: Payer: Medicare Other

## 2017-11-04 ENCOUNTER — Encounter: Payer: Self-pay | Admitting: Radiology

## 2017-11-04 DIAGNOSIS — Z515 Encounter for palliative care: Secondary | ICD-10-CM | POA: Diagnosis not present

## 2017-11-04 DIAGNOSIS — Z7189 Other specified counseling: Secondary | ICD-10-CM | POA: Diagnosis not present

## 2017-11-04 DIAGNOSIS — J440 Chronic obstructive pulmonary disease with acute lower respiratory infection: Secondary | ICD-10-CM | POA: Diagnosis present

## 2017-11-04 DIAGNOSIS — N186 End stage renal disease: Secondary | ICD-10-CM | POA: Diagnosis present

## 2017-11-04 DIAGNOSIS — Z66 Do not resuscitate: Secondary | ICD-10-CM | POA: Diagnosis present

## 2017-11-04 DIAGNOSIS — Z23 Encounter for immunization: Secondary | ICD-10-CM | POA: Diagnosis present

## 2017-11-04 DIAGNOSIS — E876 Hypokalemia: Secondary | ICD-10-CM | POA: Diagnosis present

## 2017-11-04 DIAGNOSIS — I872 Venous insufficiency (chronic) (peripheral): Secondary | ICD-10-CM | POA: Diagnosis present

## 2017-11-04 DIAGNOSIS — D631 Anemia in chronic kidney disease: Secondary | ICD-10-CM | POA: Diagnosis present

## 2017-11-04 DIAGNOSIS — R4182 Altered mental status, unspecified: Secondary | ICD-10-CM | POA: Diagnosis not present

## 2017-11-04 DIAGNOSIS — J811 Chronic pulmonary edema: Secondary | ICD-10-CM | POA: Diagnosis present

## 2017-11-04 DIAGNOSIS — E1122 Type 2 diabetes mellitus with diabetic chronic kidney disease: Secondary | ICD-10-CM | POA: Diagnosis present

## 2017-11-04 DIAGNOSIS — E1140 Type 2 diabetes mellitus with diabetic neuropathy, unspecified: Secondary | ICD-10-CM | POA: Diagnosis present

## 2017-11-04 DIAGNOSIS — R0602 Shortness of breath: Secondary | ICD-10-CM | POA: Diagnosis present

## 2017-11-04 DIAGNOSIS — J9601 Acute respiratory failure with hypoxia: Secondary | ICD-10-CM | POA: Diagnosis present

## 2017-11-04 DIAGNOSIS — J8410 Pulmonary fibrosis, unspecified: Secondary | ICD-10-CM | POA: Diagnosis present

## 2017-11-04 DIAGNOSIS — N400 Enlarged prostate without lower urinary tract symptoms: Secondary | ICD-10-CM | POA: Diagnosis present

## 2017-11-04 DIAGNOSIS — G9341 Metabolic encephalopathy: Secondary | ICD-10-CM | POA: Diagnosis present

## 2017-11-04 DIAGNOSIS — E785 Hyperlipidemia, unspecified: Secondary | ICD-10-CM | POA: Diagnosis present

## 2017-11-04 DIAGNOSIS — D869 Sarcoidosis, unspecified: Secondary | ICD-10-CM | POA: Diagnosis present

## 2017-11-04 DIAGNOSIS — J189 Pneumonia, unspecified organism: Secondary | ICD-10-CM | POA: Diagnosis present

## 2017-11-04 DIAGNOSIS — N2581 Secondary hyperparathyroidism of renal origin: Secondary | ICD-10-CM | POA: Diagnosis present

## 2017-11-04 DIAGNOSIS — M109 Gout, unspecified: Secondary | ICD-10-CM | POA: Diagnosis present

## 2017-11-04 DIAGNOSIS — J209 Acute bronchitis, unspecified: Secondary | ICD-10-CM | POA: Diagnosis present

## 2017-11-04 DIAGNOSIS — I4581 Long QT syndrome: Secondary | ICD-10-CM | POA: Diagnosis present

## 2017-11-04 DIAGNOSIS — E1169 Type 2 diabetes mellitus with other specified complication: Secondary | ICD-10-CM | POA: Diagnosis present

## 2017-11-04 DIAGNOSIS — I120 Hypertensive chronic kidney disease with stage 5 chronic kidney disease or end stage renal disease: Secondary | ICD-10-CM | POA: Diagnosis present

## 2017-11-04 LAB — GLUCOSE, CAPILLARY
GLUCOSE-CAPILLARY: 165 mg/dL — AB (ref 70–99)
GLUCOSE-CAPILLARY: 160 mg/dL — AB (ref 70–99)
Glucose-Capillary: 198 mg/dL — ABNORMAL HIGH (ref 70–99)
Glucose-Capillary: 259 mg/dL — ABNORMAL HIGH (ref 70–99)

## 2017-11-04 MED ORDER — ORAL CARE MOUTH RINSE
15.0000 mL | Freq: Two times a day (BID) | OROMUCOSAL | Status: DC
  Administered 2017-11-04 – 2017-11-07 (×3): 15 mL via OROMUCOSAL

## 2017-11-04 MED ORDER — IOHEXOL 300 MG/ML  SOLN
75.0000 mL | Freq: Once | INTRAMUSCULAR | Status: AC | PRN
  Administered 2017-11-04: 75 mL via INTRAVENOUS

## 2017-11-04 MED ORDER — IOHEXOL 350 MG/ML SOLN: 75.0000 mL | Freq: Once | INTRAVENOUS | Status: DC | PRN

## 2017-11-04 NOTE — Progress Notes (Addendum)
TC staffing with Dr. Leslye Peer regarding cough, as reported earlier increase of 02 to 3l/[place], less alert; arouses but returns to sleep, not interested in eating; drank supplements and water; current VS trend and blood sugar trend. Discussed CT Scan results with MD aware. Wife informed and updated. MD may consider pulmonary consult tomorrow and head scan. Wife informed, updated and agrees with current plan of care.

## 2017-11-04 NOTE — Progress Notes (Signed)
PT Cancellation Note  Patient Details Name: Joseph Newman MRN: [id number] DOB: [birth year]   Cancelled Treatment:    Reason Eval/Treat Not Completed: Patient at procedure or test/unavailable.  As PT was setting up for visit the tech arrived to transport to CT.  Will try again at another time.   Ramond Dial 11/04/2017, 12:19 PM   Mee Hives, PT MS Acute Rehab Dept. Number: [facility]

## 2017-11-04 NOTE — Progress Notes (Signed)
[facility]  ROUNDING NOTE   Subjective:   Patient slipped off his bed last night.  No injuries reported Still has cough Getting CT of the chest later today   Objective:  Vital signs in last 24 hours:  Temp:  [97.7 F (36.5 C)-98.3 F (36.8 C)] 98.3 F (36.8 C) (10/13 0800) Pulse Rate:  [77-92] 77 (10/13 0929) Resp:  [16-35] 20 (10/13 0929) BP: (121-158)/(64-83) 158/78 (10/13 0800) SpO2:  [90 %-96 %] 94 % (10/13 0929) Weight:  [94.1 kg] 94.1 kg (10/12 1516)  Weight change:  Filed Weights   11/03/17 1516  Weight: 94.1 kg    Intake/Output: I/O last 3 completed shifts: In: 3 [I.V.:3] Out: 1001 [Other:1000; Stool:1]   Intake/Output this shift:  No intake/output data recorded.  Physical Exam: General: NAD, laying in bed  Head:  Moist oral mucosal membranes  Eyes: Anicteric,   Neck: Supple,   Lungs:  Clear to auscultation, not taking deep breaths, limited exam  Heart: Regular rate and rhythm  Abdomen:  Soft, nontender,   Extremities: no peripheral edema.  Neurologic:  Sleepy, answers few questions  Skin: No lesions  Access: RIJ permcath, PD catheter    Basic Metabolic Panel: Recent Labs  Lab 11/01/17 1542 11/02/17 0546 11/03/17 0525  NA 139 138 137  K 3.0* 2.9* 2.9*  CL 100 101 100  CO2 30 28 27   GLUCOSE 149* 191* 190*  BUN 15 21 28*  CREATININE 1.85* 2.58* 3.48*  CALCIUM 8.4* 8.1* 8.4*    Liver Function Tests: Recent Labs  Lab 11/01/17 1542  AST 14*  ALT 10  ALKPHOS 71  BILITOT 0.7  PROT 6.8  ALBUMIN 2.5*   No results for input(s): LIPASE, AMYLASE in the last 168 hours. No results for input(s): AMMONIA in the last 168 hours.  CBC: Recent Labs  Lab 11/01/17 1542 11/02/17 0546  WBC 10.1 8.5  HGB 10.1* 9.4*  HCT 30.2* 28.4*  MCV 83.0 82.6  PLT 223 205    Cardiac Enzymes: Recent Labs  Lab 11/01/17 1542  TROPONINI 0.06*    BNP: Invalid input(s): POCBNP  CBG: Recent Labs  Lab 11/02/17 2019 11/03/17 0813  11/03/17 1517 11/03/17 2047 11/04/17 0751  GLUCAP 199* 178* 169* 215* 165*    Microbiology: Results for orders placed or performed during the hospital encounter of 11/01/17  MRSA PCR Screening     Status: Abnormal   Collection Time: 11/02/17  3:06 AM  Result Value Ref Range Status   MRSA by PCR POSITIVE (A) NEGATIVE Final    Comment:        The GeneXpert MRSA Assay (FDA approved for NASAL specimens only), is one component of a comprehensive MRSA colonization surveillance program. It is not intended to diagnose MRSA infection nor to guide or monitor treatment for MRSA infections. RESULT CALLED TO, READ BACK BY AND VERIFIED WITH: C/SARA [facility] @0510  11/02/17 [facility] Performed at [facility], [address] [place], [place] [address]     Coagulation Studies: No results for input(s): LABPROT, INR in the last 72 hours.  Urinalysis: No results for input(s): COLORURINE, LABSPEC, PHURINE, GLUCOSEU, HGBUR, BILIRUBINUR, KETONESUR, PROTEINUR, UROBILINOGEN, NITRITE, LEUKOCYTESUR in the last 72 hours.  Invalid input(s): APPERANCEUR    Imaging: No results found.   Medications:   . sodium chloride     . allopurinol  50 mg Oral QODAY  . irbesartan  300 mg Oral Daily   And  . amLODipine  10 mg Oral Daily  . amoxicillin-clavulanate  1 tablet Oral QHS  . aspirin EC  81 mg Oral Daily  . atorvastatin  40 mg Oral Daily  . calcitRIOL  0.25 mcg Oral Daily  . carvedilol  6.25 mg Oral BID WC  . Chlorhexidine Gluconate Cloth  6 each Topical Q0600  . cholecalciferol  2,000 Units Oral Daily  . citalopram  10 mg Oral Daily  . feeding supplement (NEPRO CARB STEADY)  237 mL Oral BID BM  . fluticasone  2 spray Each Nare Daily  . heparin  5,000 Units Subcutaneous Q8H  . insulin aspart  0-5 Units Subcutaneous QHS  . insulin aspart  0-9 Units Subcutaneous TID WC  . multivitamin  1 tablet Oral QHS  . mupirocin ointment  1 application Nasal BID  . sodium chloride flush  3 mL  Intravenous Q12H  . tamsulosin  0.4 mg Oral QPC breakfast  . vitamin B-12  1,000 mcg Oral Daily   sodium chloride, acetaminophen **OR** acetaminophen, albuterol, guaiFENesin-dextromethorphan, hydrALAZINE, HYDROcodone-acetaminophen, ondansetron **OR** ondansetron (ZOFRAN) IV, senna-docusate, sodium chloride flush  Assessment/ Plan:  Mr. Marek Nghiem is a 72 y.o. black male with end stage renal disease on dialysis, COPD, arthritis, depression, sleep apnea, diabetes mellitus type 2 with neuropathy, hypertension,  and gout  Currently on back up hemodialysis [facility] permcath  Peritoneal dialysis prescription 107 kg/ 4 x 3000 mL, last fill 2000 mL  1.  End-stage renal disease:   - next HD planned for Monday after iv contrast exposure  2.  Altered mental status: - improving slowly - not very interactive today  3. Anemia of chronic kidney disease:   - EPO with HD treatment.   4. Secondary Hyperparathyroidism: outpatient PTH 156, phosphorus and calcium at goal - not currently on binders.   6. Gout: not in acute flare.  - allopurinol   7. MRSA screen + 11/02/17   LOS: 0 Trisha Ken 10/13/201911:09 AM

## 2017-11-04 NOTE — Progress Notes (Signed)
Patient ID: Joseph Newman, male   DOB: 24-Sep-1945, 72 y.o.   MRN: [id number]  [employer] PROGRESS NOTE  Joseph Newman LFY:[id number] DOB: 1945-12-15 DOA: 11/01/2017 PCP: Steele Sizer, MD  HPI/Subjective: Nurse stated that the patient at change of shift last night was found on the ground with his feet up in the bed.  The patient is unable to tell me how that happened.  The patient's wife was not here at that time.  Objective: Vitals:   11/04/17 0917 11/04/17 0929  BP:    Pulse: 78 77  Resp:  20  Temp:    SpO2: 92% 94%    Filed Weights   11/03/17 1516  Weight: 94.1 kg    ROS: Review of Systems  Respiratory: Positive for cough and shortness of breath.   Cardiovascular: Negative for chest pain.  Gastrointestinal: Negative for abdominal pain, nausea and vomiting.   Exam: Physical Exam  HENT:  Nose: No mucosal edema.  Mouth/Throat: No oropharyngeal exudate or posterior oropharyngeal edema.  Eyes: Pupils are equal, round, and reactive to light. Conjunctivae, EOM and lids are normal.  Neck: No JVD present. Carotid bruit is not present. No edema present. No thyroid mass and no thyromegaly present.  Cardiovascular: S1 normal and S2 normal. Exam reveals no gallop.  No murmur heard. Pulses:      Dorsalis pedis pulses are 2+ on the right side, and 2+ on the left side.  Respiratory: No respiratory distress. He has decreased breath sounds in the right lower field and the left lower field. He has no wheezes. He has rhonchi in the right lower field and the left lower field. He has no rales.  GI: Soft. Bowel sounds are normal. There is no tenderness.  Musculoskeletal:       Right ankle: He exhibits no swelling.       Left ankle: He exhibits no swelling.  Lymphadenopathy:    He has no cervical adenopathy.  Neurological: He is alert. No cranial nerve deficit.  Skin: Skin is warm. No rash noted. Nails show no clubbing.  Psychiatric: He has a normal mood and affect.      Data  Reviewed: Basic Metabolic Panel: Recent Labs  Lab 11/01/17 1542 11/02/17 0546 11/03/17 0525  NA 139 138 137  K 3.0* 2.9* 2.9*  CL 100 101 100  CO2 30 28 27   GLUCOSE 149* 191* 190*  BUN 15 21 28*  CREATININE 1.85* 2.58* 3.48*  CALCIUM 8.4* 8.1* 8.4*   Liver Function Tests: Recent Labs  Lab 11/01/17 1542  AST 14*  ALT 10  ALKPHOS 71  BILITOT 0.7  PROT 6.8  ALBUMIN 2.5*   CBC: Recent Labs  Lab 11/01/17 1542 11/02/17 0546  WBC 10.1 8.5  HGB 10.1* 9.4*  HCT 30.2* 28.4*  MCV 83.0 82.6  PLT 223 205   Cardiac Enzymes: Recent Labs  Lab 11/01/17 1542  TROPONINI 0.06*    CBG: Recent Labs  Lab 11/02/17 2019 11/03/17 0813 11/03/17 1517 11/03/17 2047 11/04/17 0751  GLUCAP 199* 178* 169* 215* 165*    Recent Results (from the past 240 hour(s))  MRSA PCR Screening     Status: Abnormal   Collection Time: 11/02/17  3:06 AM  Result Value Ref Range Status   MRSA by PCR POSITIVE (A) NEGATIVE Final    Comment:        The GeneXpert MRSA Assay (FDA approved for NASAL specimens only), is one component of a comprehensive MRSA colonization surveillance program. It is not intended  to diagnose MRSA infection nor to guide or monitor treatment for MRSA infections. RESULT CALLED TO, READ BACK BY AND VERIFIED WITH: C/SARA [facility] @0510  11/02/17 [facility] Performed at [facility], [address] [place], [place] [address]      Studies: No results found.  Scheduled Meds: . allopurinol  50 mg Oral QODAY  . irbesartan  300 mg Oral Daily   And  . amLODipine  10 mg Oral Daily  . amoxicillin-clavulanate  1 tablet Oral QHS  . aspirin EC  81 mg Oral Daily  . atorvastatin  40 mg Oral Daily  . calcitRIOL  0.25 mcg Oral Daily  . carvedilol  6.25 mg Oral BID WC  . Chlorhexidine Gluconate Cloth  6 each Topical Q0600  . cholecalciferol  2,000 Units Oral Daily  . citalopram  10 mg Oral Daily  . docusate sodium  100 mg Oral BID  . feeding supplement (NEPRO CARB  STEADY)  237 mL Oral BID BM  . fluticasone  2 spray Each Nare Daily  . heparin  5,000 Units Subcutaneous Q8H  . insulin aspart  0-5 Units Subcutaneous QHS  . insulin aspart  0-9 Units Subcutaneous TID WC  . multivitamin  1 tablet Oral QHS  . mupirocin ointment  1 application Nasal BID  . sodium chloride flush  3 mL Intravenous Q12H  . tamsulosin  0.4 mg Oral QPC breakfast  . vitamin B-12  1,000 mcg Oral Daily   Continuous Infusions: . sodium chloride      Assessment/Plan:  1. Shortness of breath and cough.  Chest x-ray concerning for interstitial lung disease we will get a CT scan of the chest.  Also concerning for fluid which dialysis can remove.   2. Acute encephalopathy.  Wondering if this patient has underlying dementia 3. End-stage renal disease.  Patient's wife wants to go back on peritoneal dialysis and take him home.  Currently on hemodialysis at this point.  Nephrology will have to make this decision.  With a fall out of the bed I think he is good to be too much work for the wife to take care of this patient at home. 4. Weakness.  Physical therapy evaluation. 5. Hypokalemia.  Will not replace as have dialysis replaced. 6. Essential hypertension on Norvasc Avapro and Coreg 7. Hyperlipidemia unspecified on atorvastatin 8. Depression on Celexa 9. History of gout on allopurinol 10. Sinusitis.  Put on Augmentin   Code Status:     Code Status Orders  (From admission, onward)         Start     Ordered   11/01/17 1836  Do not attempt resuscitation (DNR)  Continuous    Question Answer Comment  In the event of cardiac or respiratory ARREST Do not call a "code blue"   In the event of cardiac or respiratory ARREST Do not perform Intubation, CPR, defibrillation or ACLS   In the event of cardiac or respiratory ARREST Use medication by any route, position, wound care, and other measures to relive pain and suffering. May use oxygen, suction and manual treatment of airway  obstruction as needed for comfort.      11/01/17 1835        Code Status History    Date Active Date Inactive Code Status Order ID Comments User Context   09/25/2017 1616 09/29/2017 2043 Full Code [id number]  Epifanio Lesches, MD ED    Advance Directive Documentation     Most Recent Value  Type of Advance Directive  Healthcare Power of Attorney, Living will  Pre-existing out of facility DNR order (yellow form or pink MOST form)  -  "MOST" Form in Place?  -     Family Communication: Wife at the bedside Disposition Plan: To be determined  Consultants:  Nephrology  Antibiotics:  Augmentin  Time spent: 27 minutes  [place]

## 2017-11-05 LAB — GLUCOSE, CAPILLARY
GLUCOSE-CAPILLARY: 184 mg/dL — AB (ref 70–99)
GLUCOSE-CAPILLARY: 171 mg/dL — AB (ref 70–99)
Glucose-Capillary: 134 mg/dL — ABNORMAL HIGH (ref 70–99)

## 2017-11-05 LAB — RENAL FUNCTION PANEL
ANION GAP: 11 (ref 5–15)
Albumin: 2.2 g/dL — ABNORMAL LOW (ref 3.5–5.0)
BUN: 30 mg/dL — ABNORMAL HIGH (ref 8–23)
CHLORIDE: 99 mmol/L (ref 98–111)
CO2: 32 mmol/L (ref 22–32)
CREATININE: 4.6 mg/dL — AB (ref 0.61–1.24)
Calcium: 8.8 mg/dL — ABNORMAL LOW (ref 8.9–10.3)
GFR calc non Af Amer: 11 mL/min — ABNORMAL LOW (ref >60)
GFR, EST AFRICAN AMERICAN: 13 mL/min — AB (ref >60)
Glucose, Bld: 176 mg/dL — ABNORMAL HIGH (ref 70–99)
Phosphorus: 2.7 mg/dL (ref 2.5–4.6)
Potassium: 3.4 mmol/L — ABNORMAL LOW (ref 3.5–5.1)
Sodium: 142 mmol/L (ref 135–145)

## 2017-11-05 MED ORDER — VANCOMYCIN HCL 10 G IV SOLR
2000.0000 mg | Freq: Once | INTRAVENOUS | Status: AC
  Administered 2017-11-05: 2000 mg via INTRAVENOUS
  Filled 2017-11-05: qty 2000

## 2017-11-05 MED ORDER — HEPARIN SODIUM (PORCINE) 1000 UNIT/ML DIALYSIS
20.0000 [IU]/kg | INTRAMUSCULAR | Status: DC | PRN
  Filled 2017-11-05: qty 2

## 2017-11-05 MED ORDER — SODIUM CHLORIDE 0.9 % IV SOLN
1.0000 g | Freq: Once | INTRAVENOUS | Status: DC
  Filled 2017-11-05: qty 1

## 2017-11-05 MED ORDER — SODIUM CHLORIDE 0.9 % IV SOLN
1.0000 g | INTRAVENOUS | Status: DC
  Administered 2017-11-05 – 2017-11-06 (×2): 1 g via INTRAVENOUS
  Filled 2017-11-05 (×3): qty 1

## 2017-11-05 MED ORDER — SODIUM CHLORIDE 0.9 % IV SOLN: 1.0000 g | INTRAVENOUS | Status: DC

## 2017-11-05 MED ORDER — VANCOMYCIN HCL IN DEXTROSE 1-5 GM/200ML-% IV SOLN
1000.0000 mg | INTRAVENOUS | Status: DC | PRN
  Filled 2017-11-05: qty 200

## 2017-11-05 NOTE — Progress Notes (Signed)
HD tx end    11/05/17 1345  Vital Signs  Pulse Rate 78  Pulse Rate Source Monitor  Resp (!) 29  BP 136/76  BP Location Right Arm  BP Method Automatic  Patient Position (if appropriate) Sitting  Oxygen Therapy  SpO2 98 %  O2 Device Nasal Cannula  O2 Flow Rate (L/min) 3 L/min  During Hemodialysis Assessment  Dialysis Fluid Bolus Normal Saline  Bolus Amount (mL) 250 mL  Intra-Hemodialysis Comments Tx completed

## 2017-11-05 NOTE — Progress Notes (Signed)
PT Cancellation Note  Patient Details Name: Joseph Newman MRN: [id number] DOB: [birth year]   Cancelled Treatment:    Reason Eval/Treat Not Completed: Medical issues which prohibited therapy.  Per RN, pt with Hgb of 5.9 and preparing for chest CT.  Will hold PT until pt more medically appropriate.   Collie Siad PT, DPT 11/05/2017, 9:23 AM

## 2017-11-05 NOTE — Progress Notes (Signed)
Pre HD assessment   11/05/17 1008  Vital Signs  Temp 98.9 F (37.2 C)  Temp Source Oral  Pulse Rate 84  Pulse Rate Source Monitor  Resp (!) 33  BP 127/71  BP Location Right Arm  BP Method Automatic  Patient Position (if appropriate) Sitting  Oxygen Therapy  SpO2 96 %  O2 Device Nasal Cannula  O2 Flow Rate (L/min) 3 L/min  Pain Assessment  Pain Scale 0-10  Pain Score 0  Dialysis Weight  Weight 94 kg (last known weight, pt unable to stand, MD aware, in chair )  Type of Weight Pre-Dialysis  Time-Out for Hemodialysis  What Procedure? HD  Pt Identifiers(min of two) First/Last Name;MRN/Account#  Correct Site? Yes  Correct Side? Yes  Correct Procedure? Yes  Consents Verified? Yes  Rad Studies Available? N/A  Safety Precautions Reviewed? Yes  Engineer, civil (consulting) Number  ([id number])  Station Number 4  UF/Alarm Test Passed  Conductivity: Meter 13.6  Conductivity: Machine  13.8  pH 7.6  Reverse Osmosis main  Normal Saline Lot Number [id number]  Dialyzer Lot Number [id number]  Disposable Set Lot Number [id number]  Machine Temperature 98.6 F (37 C)  Musician and Audible Yes  Blood Lines Intact and Secured Yes  Pre Treatment Patient Checks  Vascular access used during treatment Catheter  Patient is receiving dialysis in a chair Yes  Hepatitis B Surface Antigen Results Negative  Date Hepatitis B Surface Antigen Drawn 10/30/17  Isolation Initiated Yes (+ MRSA)  Hepatitis B Surface Antibody  (<10)  Date Hepatitis B Surface Antibody Drawn 10/01/17  Hemodialysis Consent Verified Yes  Hemodialysis Standing Orders Initiated Yes  ECG (Telemetry) Monitor On Yes  Prime Ordered Normal Saline  Length of  DialysisTreatment -hour(s) 3.5 Hour(s)  Dialyzer Elisio 17H NR  Dialysate 3K, 2.5 Ca  Dialysis Anticoagulant None  Dialysate Flow Ordered 800  Blood Flow Rate Ordered 400 mL/min  Ultrafiltration Goal 1 Liters  Pre Treatment Labs Renal panel  Dialysis Blood Pressure Support  Ordered Normal Saline  Education / Care Plan  Dialysis Education Provided Yes  Documented Education in Care Plan Yes  Hemodialysis Catheter Right Subclavian Double-lumen;Permanent  No Placement Date or Time found.   Placed prior to admission: Yes  Orientation: Right  Access Location: Subclavian  Hemodialysis Catheter Type: Double-lumen;Permanent  Site Condition No complications  Blue Lumen Status Heparin locked  Red Lumen Status Heparin locked  Purple Lumen Status N/A  Dressing Type Biopatch  Dressing Status Clean;Dry;Intact  Drainage Description None

## 2017-11-05 NOTE — Plan of Care (Signed)
Pt had to urine production as of time of note. HD on Saturday .

## 2017-11-05 NOTE — Consult Note (Addendum)
Pharmacy Antibiotic Note  Joseph Newman is a 72 y.o. male admitted on 11/01/2017 with AMS/Acute Metabolic encephalopathy. Recent CT of chest concerning for worsening inflammation/infection. Pharmacy has been consulted for cefepime and vancomycin dosing.  Patient had PMH of ESRD and receives HD T, Th, Sat.  Plan: 1. Give Vancomycin 2g IV LD x 1, following by vancomycin 1g IV with HD on HD days. Will need to monitor for changed in HD schedule and make sure dose is given. Plan for vanc level prior to 3rd HD session.  Target pre-HD level 15-25 mcg/mL.   2. Start Cefepime 1g IV every 24 hours - Dose should be given after HD on HD days.   Height: 6\' 5"  (195.6 cm) Weight: 207 lb 3.7 oz (94 kg)(last known weight, pt unable to stand, MD aware, in chair ) IBW/kg (Calculated) : 89.1  Temp (24hrs), Avg:98.5 F (36.9 C), Min:97.8 F (36.6 C), Max:98.9 F (37.2 C)  Recent Labs  Lab 11/01/17 1542 11/02/17 0546 11/03/17 0525  WBC 10.1 8.5  --   CREATININE 1.85* 2.58* 3.48*    Estimated Creatinine Clearance: 23.5 mL/min (A) (by C-G formula based on SCr of 3.48 mg/dL (H)).    No Known Allergies  Antimicrobials this admission: 10/10 azithromycin>> 10/12 10/12 Augmentin>> 10/14 10/14 cefepime>> 10/14 vancomycin>>   Dose adjustments this admission: NA  Microbiology results: 10/11 MRSA PCR: positive   Thank you for allowing pharmacy to be a part of this patient's care.  Pernell Dupre, PharmD, BCPS Clinical Pharmacist 11/05/2017 10:47 AM

## 2017-11-05 NOTE — Progress Notes (Signed)
HD tx start    11/05/17 1015  Vital Signs  Pulse Rate 85  Pulse Rate Source Monitor  Resp (!) 31  BP 139/74  BP Location Right Arm  BP Method Automatic  Patient Position (if appropriate) Lying  Oxygen Therapy  SpO2 98 %  O2 Device Nasal Cannula  O2 Flow Rate (L/min) 3 L/min  During Hemodialysis Assessment  Blood Flow Rate (mL/min) 400 mL/min  Arterial Pressure (mmHg) -160 mmHg  Venous Pressure (mmHg) 110 mmHg  Transmembrane Pressure (mmHg) 50 mmHg  Ultrafiltration Rate (mL/min) 570 mL/min  Dialysate Flow Rate (mL/min) 800 ml/min  Conductivity: Machine  13.6  HD Safety Checks Performed Yes  Dialysis Fluid Bolus Normal Saline  Bolus Amount (mL) 250 mL  Intra-Hemodialysis Comments Tx initiated  Hemodialysis Catheter Right Subclavian Double-lumen;Permanent  No Placement Date or Time found.   Placed prior to admission: Yes  Orientation: Right  Access Location: Subclavian  Hemodialysis Catheter Type: Double-lumen;Permanent  Blue Lumen Status Infusing  Red Lumen Status Infusing

## 2017-11-05 NOTE — Progress Notes (Signed)
Pre HD assessment    11/05/17 1009  Neurological  Level of Consciousness Alert  Orientation Level Oriented to person;Disoriented to place;Disoriented to time;Disoriented to situation  Respiratory  Respiratory Pattern Tachypnea  Chest Assessment Chest expansion symmetrical  Cough Non-productive  Cardiac  ECG Monitor Yes  Cardiac Rhythm NSR  Vascular  R Radial Pulse +2  L Radial Pulse +2  Edema Generalized  Integumentary  Integumentary (WDL) X  Skin Color Appropriate for ethnicity  Musculoskeletal  Musculoskeletal (WDL) X  Generalized Weakness Yes  Assistive Device None  GU Assessment  Genitourinary (WDL) X  Genitourinary Symptoms  (HD)  Psychosocial  Psychosocial (WDL) WDL  Needs Expressed Physical  Emotional support given Given to patient

## 2017-11-05 NOTE — Clinical Social Work Note (Signed)
CSW spoke with patient's wife Joseph Newman at bedside. [name] states that she would like patient to go to SNF but would like to look at options other than [facility]. CSW initiated SNF bed search. CSW will give offers once received. CSW will follow for discharge planning.   [place], [place]

## 2017-11-05 NOTE — Evaluation (Signed)
Physical Therapy Evaluation Patient Details Name: Joseph Newman MRN: [id number] DOB: [birth year] Today's Date: 11/05/2017   History of Present Illness  Pt is a 72 y/o M who was sent to the ED from dialysis center due to [place].  Pt has had a cough for 4-5 days.  CAT scan of head unremarkable.  Chest x-ray show pulmonary fibrosis or interstitial edema. Pt with abnormal CT scan of the chest with increased  hilar adenopathy cannot rule out infectious versus inflammatory versus malignant etiologies.  Pt's PMH includes CKD, venous insufficiency.    Clinical Impression  Pt admitted with above diagnosis. Pt currently with functional limitations due to the deficits listed below (see PT Problem List). Joseph Newman presents with BUE and BLE weakness and becomes increasingly lethargic and fatigues quickly as evaluation progresses.  Not safe at this time to attempt bed mobility as pt fatigues and keeps eyes mainly closed toward end of evaluation and following supine therapeutic exercise.  Unsure of pt's PLOF or home layout (pt from [facility]) due to pt's lethargy and inability to provide consistent information.  No family in room.  Given pt's current mobility status, recommending SNF at d/c.  Pt will benefit from skilled PT to increase their independence and safety with mobility to allow discharge to the venue listed below.      Follow Up Recommendations SNF    Equipment Recommendations  Other (comment)(TBD at next venue of care)    Recommendations for Other Services       Precautions / Restrictions Precautions Precautions: Fall;Other (comment) Precaution Comments: monitor O2 Restrictions Weight Bearing Restrictions: No      Mobility  Bed Mobility               General bed mobility comments: Pt refuses to attempt bed mobility.  Additionally, pt too lethargic with therapeutic exercises to participate safely in bed mobility activity.   Transfers                    Ambulation/Gait                 Stairs            Wheelchair Mobility    Modified Rankin (Stroke Patients Only)       Balance                                             Pertinent Vitals/Pain Pain Assessment: Faces Faces Pain Scale: Hurts even more Pain Location: Pt grimacing with LUE movement, reports pain is at his elbow Pain Descriptors / Indicators: Grimacing Pain Intervention(s): Limited activity within patient's tolerance;Monitored during session    Home Living Family/patient expects to be discharged to:: Skilled nursing facility                 Additional Comments: Pt too lethargic to provide home layout information    Prior Function Level of Independence: Needs assistance   Gait / Transfers Assistance Needed: Pt reports he was ambulating with rollator but did not provide information on ambulatory distance ability  ADL's / Homemaking Assistance Needed: Pt requires assist from wife for sponge bathing.  Pt reports he dresses independently.    Comments: Question reliability of information provided by the pt due to confusion     Hand Dominance   Dominant Hand: Right    Extremity/Trunk Assessment   Upper  Extremity Assessment Upper Extremity Assessment: LUE deficits/detail;RUE deficits/detail RUE Deficits / Details: Strength grossly 3-/5 LUE Deficits / Details: Limited by reported pain at elbow, unable to test LUE: Unable to fully assess due to pain    Lower Extremity Assessment Lower Extremity Assessment: (BLE strength grossly 3-/5)       Communication   Communication: No difficulties  Cognition Arousal/Alertness: Lethargic Behavior During Therapy: Flat affect Overall Cognitive Status: No family/caregiver present to determine baseline cognitive functioning                                 General Comments: Pt slow to respond to questions and provides contradictory information at times.        General Comments General  comments (skin integrity, edema, etc.): SpO2 remains at or above 97% on 3L O2 throughout session    Exercises General Exercises - Upper Extremity Shoulder Flexion: AROM;Right;10 reps;Supine General Exercises - Lower Extremity Ankle Circles/Pumps: AROM;Supine;Both;5 reps Hip ABduction/ADduction: AAROM;Both;10 reps;Supine;PROM Straight Leg Raises: AAROM;Both;Supine;Other (comment);Other reps (comment)(2 reps, limited by fatigue)   Assessment/Plan    PT Assessment Patient needs continued PT services  PT Problem List Decreased strength;Decreased range of motion;Decreased activity tolerance;Decreased balance;Decreased mobility;Decreased cognition;Decreased knowledge of use of DME;Decreased safety awareness;Pain;Cardiopulmonary status limiting activity       PT Treatment Interventions DME instruction;Gait training;Stair training;Functional mobility training;Therapeutic activities;Therapeutic exercise;Balance training;Neuromuscular re-education;Cognitive remediation;Patient/family education;Wheelchair mobility training    PT Goals (Current goals can be found in the Care Plan section)  Acute Rehab PT Goals Patient Stated Goal: none stated due to lethargy PT Goal Formulation: Patient unable to participate in goal setting Time For Goal Achievement: 11/19/17 Potential to Achieve Goals: Fair    Frequency Min 2X/week   Barriers to discharge Other (comment) Unsure of amount of assist available at home    Co-evaluation               AM-PAC PT "6 Clicks" Daily Activity  Outcome Measure Difficulty turning over in bed (including adjusting bedclothes, sheets and blankets)?: Unable Difficulty moving from lying on back to sitting on the side of the bed? : Unable Difficulty sitting down on and standing up from a chair with arms (e.g., wheelchair, bedside commode, etc,.)?: Unable Help needed moving to and from a bed to chair (including a wheelchair)?: Total Help needed walking in hospital  room?: Total Help needed climbing 3-5 steps with a railing? : Total 6 Click Score: 6    End of Session Equipment Utilized During Treatment: Oxygen Activity Tolerance: Patient limited by lethargy;Patient limited by fatigue Patient left: in bed;with call bell/phone within reach;with bed alarm set Nurse Communication: Mobility status PT Visit Diagnosis: Muscle weakness (generalized) (M62.81);Difficulty in walking, not elsewhere classified (R26.2)    Time: 9794-8016 PT Time Calculation (min) (ACUTE ONLY): 23 min   Charges:   PT Evaluation $PT Eval Moderate Complexity: 1 Mod PT Treatments $Therapeutic Exercise: 8-22 mins        Collie Siad PT, DPT 11/05/2017, 4:25 PM

## 2017-11-05 NOTE — Progress Notes (Signed)
Post HD assessment    11/05/17 1355  Neurological  Level of Consciousness Alert  Orientation Level Oriented to person  Respiratory  Respiratory Pattern Tachypnea  Chest Assessment Chest expansion symmetrical  Cough Non-productive  Cardiac  ECG Monitor Yes  Cardiac Rhythm NSR  Vascular  R Radial Pulse +2  L Radial Pulse +2  Edema Generalized  Integumentary  Integumentary (WDL) X  Skin Color Appropriate for ethnicity  Musculoskeletal  Musculoskeletal (WDL) X  Generalized Weakness Yes  Assistive Device None  GU Assessment  Genitourinary (WDL) X  Genitourinary Symptoms  (HD)  Psychosocial  Psychosocial (WDL) WDL  Emotional support given Given to patient

## 2017-11-05 NOTE — Progress Notes (Signed)
Patient ID: Arseniy Toomey, male   DOB: [birth year], 72 y.o.   MRN: [id number]  [facility] PROGRESS NOTE  Jeshawn Melucci OHY:[id number] DOB: 04-Mar-1945 DOA: 11/01/2017 PCP: Steele Sizer, MD  HPI/Subjective: Patient answers a few yes or no questions.  Cannot elaborate very much.  Some cough.  Objective: Vitals:   11/05/17 1300 11/05/17 1315  BP: (!) 146/76 126/77  Pulse: 79 81  Resp: (!) 34 (!) 32  Temp:    SpO2: 96% 99%    Filed Weights   11/03/17 1516 11/05/17 1008  Weight: 94.1 kg 94 kg    ROS: Review of Systems  Respiratory: Positive for cough and shortness of breath.   Cardiovascular: Negative for chest pain.  Gastrointestinal: Negative for abdominal pain, nausea and vomiting.   Exam: Physical Exam  HENT:  Nose: No mucosal edema.  Mouth/Throat: No oropharyngeal exudate or posterior oropharyngeal edema.  Eyes: Pupils are equal, round, and reactive to light. Conjunctivae, EOM and lids are normal.  Neck: No JVD present. Carotid bruit is not present. No edema present. No thyroid mass and no thyromegaly present.  Cardiovascular: S1 normal and S2 normal. Exam reveals no gallop.  No murmur heard. Pulses:      Dorsalis pedis pulses are 2+ on the right side, and 2+ on the left side.  Respiratory: No respiratory distress. He has decreased breath sounds in the right lower field and the left lower field. He has no wheezes. He has rhonchi in the right lower field and the left lower field. He has no rales.  GI: Soft. Bowel sounds are normal. There is no tenderness.  Musculoskeletal:       Right ankle: He exhibits no swelling.       Left ankle: He exhibits no swelling.  Lymphadenopathy:    He has no cervical adenopathy.  Neurological: He is alert. No cranial nerve deficit.  Skin: Skin is warm. No rash noted. Nails show no clubbing.  Psychiatric: He has a normal mood and affect.      Data Reviewed: Basic Metabolic Panel: Recent Labs  Lab 11/01/17 1542 11/02/17 0546  11/03/17 0525 11/05/17 1056  NA 139 138 137 142  K 3.0* 2.9* 2.9* 3.4*  CL 100 101 100 99  CO2 30 28 27  32  GLUCOSE 149* 191* 190* 176*  BUN 15 21 28* 30*  CREATININE 1.85* 2.58* 3.48* 4.60*  CALCIUM 8.4* 8.1* 8.4* 8.8*  PHOS  --   --   --  2.7   Liver Function Tests: Recent Labs  Lab 11/01/17 1542 11/05/17 1056  AST 14*  --   ALT 10  --   ALKPHOS 71  --   BILITOT 0.7  --   PROT 6.8  --   ALBUMIN 2.5* 2.2*   CBC: Recent Labs  Lab 11/01/17 1542 11/02/17 0546  WBC 10.1 8.5  HGB 10.1* 9.4*  HCT 30.2* 28.4*  MCV 83.0 82.6  PLT 223 205   Cardiac Enzymes: Recent Labs  Lab 11/01/17 1542  TROPONINI 0.06*    CBG: Recent Labs  Lab 11/04/17 0751 11/04/17 1153 11/04/17 1647 11/04/17 2059 11/05/17 0759  GLUCAP 165* 198* 259* 160* 184*    Recent Results (from the past 240 hour(s))  MRSA PCR Screening     Status: Abnormal   Collection Time: 11/02/17  3:06 AM  Result Value Ref Range Status   MRSA by PCR POSITIVE (A) NEGATIVE Final    Comment:        The GeneXpert MRSA Assay (FDA  approved for NASAL specimens only), is one component of a comprehensive MRSA colonization surveillance program. It is not intended to diagnose MRSA infection nor to guide or monitor treatment for MRSA infections. RESULT CALLED TO, READ BACK BY AND VERIFIED WITH: C/SARA [facility] @0510  11/02/17 [facility] Performed at [facility], [address] [place], [place] [address]      Studies: Ct Chest W Contrast  Result Date: 11/04/2017 CLINICAL DATA:  Persistent cough and shortness of breath. EXAM: CT CHEST WITH CONTRAST TECHNIQUE: Multidetector CT imaging of the chest was performed during intravenous contrast administration. CONTRAST:  15mL OMNIPAQUE IOHEXOL 300 MG/ML  SOLN COMPARISON:  CT chest 8/11/9 FINDINGS: Cardiovascular: Mild cardiac enlargement. No pericardial effusion identified. Aortic atherosclerosis. Calcification within the RCA, LAD coronary arteries noted. The main  pulmonary artery measures 4.1 cm in diameter. Mediastinum/Nodes: Multinodular thyroid gland identified. The trachea appears patent and is midline. Progressive mediastinal and hilar adenopathy is identified. Index left pre-vascular lymph node measures 1.7 cm, image 49/2. Previously 1.1 cm. Adjacent pre-vascular node measures 1.5 cm, image 51/2. Previously 0.6 cm. Right hilar lymph node measures 2.1 cm, image 68/2. Previously 1.0 cm. Left hilar lymph node measures 1.7 cm, image 78/2. Previously 1.0 cm. Lungs/Pleura: Extensive respiratory motion artifact diminishes exam detail. No pleural effusion. Moderate progressive changes of emphysema. Pleural thickening along the lateral and posterior left mid and left lower lung identified. No airspace consolidation, atelectasis or pneumothorax identified. Upper Abdomen: No acute abnormality. Musculoskeletal: No chest wall abnormality. No acute or significant osseous findings. IMPRESSION: 1. Exam detail is diminished secondary to respiratory motion artifact. 2. Progressive mediastinal and bilateral hilar adenopathy is identified. Differential considerations include granulomatous inflammation/infection (including sarcoid), lymphoproliferative disorder or metastatic adenopathy. Careful clinical correlation is advised. Follow-up imaging in 3 months with contrast enhanced CT of the chest is recommended. This recommendation follows ACR consensus guidelines: Managing Incidental Findings on Thoracic CT: Mediastinal and Cardiovascular Findings. A White Paper of the ACR Incidental Findings Committee. J Am Coll Radiol. 2018; 15: 9924-2683. 3. 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 4. Aortic Atherosclerosis (ICD10-I70.0) and Emphysema (ICD10-J43.9). 5. Multi vessel coronary artery atherosclerotic calcifications Electronically Signed   By: Kerby Moors M.D.   On: 11/04/2017 12:19    Scheduled Meds: . allopurinol  50 mg Oral QODAY  . irbesartan   300 mg Oral Daily   And  . amLODipine  10 mg Oral Daily  . aspirin EC  81 mg Oral Daily  . calcitRIOL  0.25 mcg Oral Daily  . carvedilol  6.25 mg Oral BID WC  . Chlorhexidine Gluconate Cloth  6 each Topical Q0600  . cholecalciferol  2,000 Units Oral Daily  . citalopram  10 mg Oral Daily  . feeding supplement (NEPRO CARB STEADY)  237 mL Oral BID BM  . fluticasone  2 spray Each Nare Daily  . heparin  5,000 Units Subcutaneous Q8H  . insulin aspart  0-5 Units Subcutaneous QHS  . insulin aspart  0-9 Units Subcutaneous TID WC  . mouth rinse  15 mL Mouth Rinse BID  . multivitamin  1 tablet Oral QHS  . mupirocin ointment  1 application Nasal BID  . sodium chloride flush  3 mL Intravenous Q12H  . tamsulosin  0.4 mg Oral QPC breakfast  . vitamin B-12  1,000 mcg Oral Daily   Continuous Infusions: . sodium chloride    . ceFEPime (MAXIPIME) IV    . vancomycin 2,000 mg (11/05/17 1239)  . [START ON  11/06/2017] vancomycin      Assessment/Plan:  1. Acute hypoxic respiratory failure.  Abnormal CT scan of the chest with increased hilar adenopathy cannot rule out infectious versus inflammatory versus malignant etiologies.  We will get more aggressive with antibiotics with vancomycin and cefepime.  Will get pulmonary consultation.  Oxygen supplementation. 2. Acute encephalopathy.  Patient likely has underlying dementia. 3. End-stage renal disease.  Hemodialysis today.  Patient likely would not be a candidate for PD dialysis at home.  Patient will likely need facility and long-term care. 4. Weakness.  Physical therapy Reevaluation. 5. Hypokalemia.  Dialysis to manage 6. Essential hypertension on Norvasc Avapro and Coreg 7. Hyperlipidemia.  Hold his atorvastatin since the patient has acute encephalopathy. 8. Depression on Celexa 9. History of gout on allopurinol   Code Status:     Code Status Orders  (From admission, onward)         Start     Ordered   11/01/17 1836  Do not attempt  resuscitation (DNR)  Continuous    Question Answer Comment  In the event of cardiac or respiratory ARREST Do not call a "code blue"   In the event of cardiac or respiratory ARREST Do not perform Intubation, CPR, defibrillation or ACLS   In the event of cardiac or respiratory ARREST Use medication by any route, position, wound care, and other measures to relive pain and suffering. May use oxygen, suction and manual treatment of airway obstruction as needed for comfort.      11/01/17 1835        Code Status History    Date Active Date Inactive Code Status Order ID Comments User Context   09/25/2017 1616 09/29/2017 2043 Full Code [id number]  Epifanio Lesches, MD ED    Advance Directive Documentation     Most Recent Value  Type of Advance Directive  Healthcare Power of Attorney, Living will  Pre-existing out of facility DNR order (yellow form or pink MOST form)  -  "MOST" Form in Place?  -     Family Communication: Wife on the phone Disposition Plan: Will need placement  Consultants:  Nephrology  Consult pulmonary  Antibiotics: Vancomycin Cefepime  Time spent: 28 minutes  [place]

## 2017-11-05 NOTE — NC FL2 (Signed)
[place] LEVEL OF CARE SCREENING TOOL     IDENTIFICATION  Patient Name: Joseph Newman Birthdate: [birth year] Sex: male Admission Date (Current Location): 11/01/2017  [place] and [place] Number:  Engineering geologist and Address:  [facility], [address] [place], [place] [address]      Provider Number: [id number]  Attending Physician Name and Address:  Loletha Grayer, MD  Relative Name and Phone Number:  Matis Monnier - wife [phone]    Current Level of Care: Hospital Recommended Level of Care: [place] Prior Approval Number:    Date Approved/Denied:   PASRR Number: [id number]  Discharge Plan: SNF    Current Diagnoses: Patient Active Problem List   Diagnosis Date Noted  . Shortness of breath 11/04/2017  . Acute metabolic encephalopathy 02/72/5366  . Sepsis ([place]) 09/26/2017  . Left arm weakness 09/25/2017  . Dyslipidemia associated with type 2 diabetes mellitus ([place]) 03/13/2016  . Diabetic ulcer of toe of right foot associated with type 2 diabetes mellitus, limited to breakdown of skin ([place]) 03/07/2016  . Microscopic hematuria 05/30/2015  . 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 Dyslipidemia 08/31/2014  . Tobacco abuse 08/31/2014  . Diabetes mellitus with renal manifestation ([place]) 08/31/2014  . COPD (chronic obstructive pulmonary disease) ([place]) 08/31/2014  . Microalbuminuria 08/31/2014  . ED (erectile dysfunction) 08/31/2014  . Hypertension   . Dyspnea 10/09/2011    Orientation RESPIRATION BLADDER Height & Weight     Self  Normal Continent Weight: 207 lb 8 oz (94.1 kg) Height:  6\' 5"  (195.6 cm)  BEHAVIORAL SYMPTOMS/MOOD NEUROLOGICAL BOWEL NUTRITION STATUS  (none) (none) Continent Diet(Renal diet with 1257ml fluid restriction)  AMBULATORY STATUS COMMUNICATION OF NEEDS Skin   Extensive Assist Verbally Normal                       Personal Care  Assistance Level of Assistance  Bathing, Feeding, Dressing Bathing Assistance: Limited assistance Feeding assistance: Limited assistance Dressing Assistance: Maximum assistance     Functional Limitations Info  Sight, Hearing, Speech Sight Info: Adequate Hearing Info: Adequate Speech Info: Adequate    SPECIAL CARE FACTORS FREQUENCY  PT (By licensed PT), OT (By licensed OT)(Hemodialysis at [facility])     PT Frequency: 5 OT Frequency: 5            Contractures Contractures Info: Not present    Additional Factors Info  Code Status, Allergies Code Status Info: DNR Allergies Info: NKA           Current Medications (11/05/2017):  This is the current hospital active medication list Current Facility-Administered Medications  Medication Dose Route Frequency Provider Last Rate Last Dose  . 0.9 %  sodium chloride infusion  250 mL Intravenous PRN Demetrios Loll, MD      . acetaminophen (TYLENOL) tablet 650 mg  650 mg Oral Q6H PRN Demetrios Loll, MD       Or  . acetaminophen (TYLENOL) suppository 650 mg  650 mg Rectal Q6H PRN Demetrios Loll, MD      . albuterol (PROVENTIL) (2.5 MG/3ML) 0.083% nebulizer solution 2.5 mg  2.5 mg Nebulization Q2H PRN Demetrios Loll, MD      . allopurinol (ZYLOPRIM) tablet 50 mg  50 mg Oral Meliton Rattan, MD   50 mg at 11/03/17 1518  . irbesartan (AVAPRO) tablet 300 mg  300 mg Oral Daily Demetrios Loll, MD   300 mg at 11/04/17 [phone]  And  . amLODipine (NORVASC) tablet 10 mg  10 mg Oral Daily Demetrios Loll, MD   10 mg at 11/04/17 0934  . aspirin EC tablet 81 mg  81 mg Oral Daily Demetrios Loll, MD   81 mg at 11/04/17 0935  . calcitRIOL (ROCALTROL) capsule 0.25 mcg  0.25 mcg Oral Daily Demetrios Loll, MD   0.25 mcg at 11/04/17 0936  . carvedilol (COREG) tablet 6.25 mg  6.25 mg Oral BID WC Kolluru, Sarath, MD   6.25 mg at 11/04/17 1708  . ceFEPIme (MAXIPIME) 1 g in sodium chloride 0.9 % 100 mL IVPB  1 g Intravenous Once Hallaji, Sheema M, RPH      . Chlorhexidine Gluconate  Cloth 2 % PADS 6 each  6 each Topical Q0600 Henreitta Leber, MD   6 each at 11/05/17 [phone]  . cholecalciferol (VITAMIN D) tablet 2,000 Units  2,000 Units Oral Daily Demetrios Loll, MD   2,000 Units at 11/04/17 0935  . citalopram (CELEXA) tablet 10 mg  10 mg Oral Daily Demetrios Loll, MD   10 mg at 11/04/17 0934  . feeding supplement (NEPRO CARB STEADY) liquid 237 mL  237 mL Oral BID BM Henreitta Leber, MD 0 mL/hr at 11/02/17 1311 237 mL at 11/04/17 1433  . fluticasone (FLONASE) 50 MCG/ACT nasal spray 2 spray  2 spray Each Nare Daily Loletha Grayer, MD   2 spray at 11/04/17 0936  . guaiFENesin-dextromethorphan (ROBITUSSIN DM) 100-10 MG/5ML syrup 5 mL  5 mL Oral Q4H PRN Demetrios Loll, MD   5 mL at 11/02/17 0530  . heparin injection 1,900 Units  20 Units/kg Dialysis PRN Murlean Iba, MD      . heparin injection 5,000 Units  5,000 Units Subcutaneous Q8H Demetrios Loll, MD   5,000 Units at 11/05/17 0606  . hydrALAZINE (APRESOLINE) injection 10 mg  10 mg Intravenous Q4H PRN Kolluru, Sarath, MD      . HYDROcodone-acetaminophen (NORCO/VICODIN) 5-325 MG per tablet 1-2 tablet  1-2 tablet Oral Q4H PRN Demetrios Loll, MD      . insulin aspart (novoLOG) injection 0-5 Units  0-5 Units Subcutaneous QHS Demetrios Loll, MD   2 Units at 11/03/17 2154  . insulin aspart (novoLOG) injection 0-9 Units  0-9 Units Subcutaneous TID WC Demetrios Loll, MD   2 Units at 11/05/17 0908  . MEDLINE mouth rinse  15 mL Mouth Rinse BID Loletha Grayer, MD   15 mL at 11/04/17 2351  . multivitamin (RENA-VIT) tablet 1 tablet  1 tablet Oral QHS Henreitta Leber, MD   1 tablet at 11/04/17 2350  . mupirocin ointment (BACTROBAN) 2 % 1 application  1 application Nasal BID Henreitta Leber, MD   1 application at 62/37/62 2349  . ondansetron (ZOFRAN) tablet 4 mg  4 mg Oral Q6H PRN Demetrios Loll, MD       Or  . ondansetron [facility]) injection 4 mg  4 mg Intravenous Q6H PRN Demetrios Loll, MD      . senna-docusate (Senokot-S) tablet 1 tablet  1 tablet Oral QHS PRN Demetrios Loll, MD      . sodium chloride flush (NS) 0.9 % injection 3 mL  3 mL Intravenous Q12H Demetrios Loll, MD   3 mL at 11/04/17 2352  . sodium chloride flush (NS) 0.9 % injection 3 mL  3 mL Intravenous PRN Demetrios Loll, MD      . tamsulosin [facility]) capsule 0.4 mg  0.4 mg Oral QPC breakfast Demetrios Loll, MD  0.4 mg at 11/04/17 0935  . vancomycin (VANCOCIN) 2,000 mg in sodium chloride 0.9 % 500 mL IVPB  2,000 mg Intravenous Once Hallaji, Dani Gobble, RPH      . vitamin B-12 (CYANOCOBALAMIN) tablet 1,000 mcg  1,000 mcg Oral Daily Demetrios Loll, MD   1,000 mcg at 11/04/17 0935     Discharge Medications: Please see discharge summary for a list of discharge medications.  Relevant Imaging Results:  Relevant Lab Results:   Additional Information SSN: [id number]  Annamaria Boots, [place]

## 2017-11-05 NOTE — Progress Notes (Signed)
[facility]  ROUNDING NOTE   Subjective:   Appetite remains poor Chest CT shows lymphadenopathy Currently seen during dialysis seated in chair No acute complaints   HEMODIALYSIS FLOWSHEET:  Blood Flow Rate (mL/min): 400 mL/min Arterial Pressure (mmHg): -170 mmHg Venous Pressure (mmHg): 130 mmHg Transmembrane Pressure (mmHg): 50 mmHg Ultrafiltration Rate (mL/min): 570 mL/min Dialysate Flow Rate (mL/min): 800 ml/min Conductivity: Machine : 15.4 Conductivity: Machine : 15.4 Dialysis Fluid Bolus: Normal Saline Bolus Amount (mL): 250 mL Dialysate Change: 4K(change dialysate to 4K . VO  Dr Candiss Norse)    Objective:  Vital signs in last 24 hours:  Temp:  [97.8 F (36.6 C)-98.9 F (37.2 C)] 98.9 F (37.2 C) (10/14 1008) Pulse Rate:  [78-90] 79 (10/14 1100) Resp:  [18-33] 31 (10/14 1100) BP: (121-149)/(59-77) 145/77 (10/14 1100) SpO2:  [77 %-98 %] 92 % (10/14 1100) Weight:  [94 kg] 94 kg (10/14 1008)  Weight change:  Filed Weights   11/03/17 1516 11/05/17 1008  Weight: 94.1 kg 94 kg    Intake/Output: I/O last 3 completed shifts: In: 641 [P.O.:594; I.V.:3] Out: 1 [Stool:1]   Intake/Output this shift:  No intake/output data recorded.     Physical Exam: General: NAD, seated in chair  Head:  Moist oral mucosal membranes  Eyes: Anicteric,   Neck: Supple,   Lungs:  B/l crackles  Heart: Regular rate and rhythm  Abdomen:  Soft, nontender,   Extremities: no peripheral edema.  Neurologic: answers few questions  Skin: No lesions  Access: RIJ permcath, PD catheter    Basic Metabolic Panel: Recent Labs  Lab 11/01/17 1542 11/02/17 0546 11/03/17 0525  NA 139 138 137  K 3.0* 2.9* 2.9*  CL 100 101 100  CO2 30 28 27   GLUCOSE 149* 191* 190*  BUN 15 21 28*  CREATININE 1.85* 2.58* 3.48*  CALCIUM 8.4* 8.1* 8.4*    Liver Function Tests: Recent Labs  Lab 11/01/17 1542  AST 14*  ALT 10  ALKPHOS 71  BILITOT 0.7  PROT 6.8  ALBUMIN 2.5*   No results  for input(s): LIPASE, AMYLASE in the last 168 hours. No results for input(s): AMMONIA in the last 168 hours.  CBC: Recent Labs  Lab 11/01/17 1542 11/02/17 0546  WBC 10.1 8.5  HGB 10.1* 9.4*  HCT 30.2* 28.4*  MCV 83.0 82.6  PLT 223 205    Cardiac Enzymes: Recent Labs  Lab 11/01/17 1542  TROPONINI 0.06*    BNP: Invalid input(s): POCBNP  CBG: Recent Labs  Lab 11/04/17 0751 11/04/17 1153 11/04/17 1647 11/04/17 2059 11/05/17 0759  GLUCAP 165* 198* 259* 160* 184*    Microbiology: Results for orders placed or performed during the hospital encounter of 11/01/17  MRSA PCR Screening     Status: Abnormal   Collection Time: 11/02/17  3:06 AM  Result Value Ref Range Status   MRSA by PCR POSITIVE (A) NEGATIVE Final    Comment:        The GeneXpert MRSA Assay (FDA approved for NASAL specimens only), is one component of a comprehensive MRSA colonization surveillance program. It is not intended to diagnose MRSA infection nor to guide or monitor treatment for MRSA infections. RESULT CALLED TO, READ BACK BY AND VERIFIED WITH: C/SARA [facility] @0510  11/02/17 [facility] Performed at [facility], [address] [place], [place] [address]     Coagulation Studies: No results for input(s): LABPROT, INR in the last 72 hours.  Urinalysis: No results for input(s): COLORURINE, LABSPEC, [place], [employer], [place], BILIRUBINUR, KETONESUR,  PROTEINUR, UROBILINOGEN, NITRITE, LEUKOCYTESUR in the last 72 hours.  Invalid input(s): APPERANCEUR    Imaging: Ct Chest W Contrast  Result Date: 11/04/2017 CLINICAL DATA:  Persistent cough and shortness of breath. EXAM: CT CHEST WITH CONTRAST TECHNIQUE: Multidetector CT imaging of the chest was performed during intravenous contrast administration. CONTRAST:  15mL OMNIPAQUE IOHEXOL 300 MG/ML  SOLN COMPARISON:  CT chest 8/11/9 FINDINGS: Cardiovascular: Mild cardiac enlargement. No pericardial effusion identified. Aortic atherosclerosis.  Calcification within the RCA, LAD coronary arteries noted. The main pulmonary artery measures 4.1 cm in diameter. Mediastinum/Nodes: Multinodular thyroid gland identified. The trachea appears patent and is midline. Progressive mediastinal and hilar adenopathy is identified. Index left pre-vascular lymph node measures 1.7 cm, image 49/2. Previously 1.1 cm. Adjacent pre-vascular node measures 1.5 cm, image 51/2. Previously 0.6 cm. Right hilar lymph node measures 2.1 cm, image 68/2. Previously 1.0 cm. Left hilar lymph node measures 1.7 cm, image 78/2. Previously 1.0 cm. Lungs/Pleura: Extensive respiratory motion artifact diminishes exam detail. No pleural effusion. Moderate progressive changes of emphysema. Pleural thickening along the lateral and posterior left mid and left lower lung identified. No airspace consolidation, atelectasis or pneumothorax identified. Upper Abdomen: No acute abnormality. Musculoskeletal: No chest wall abnormality. No acute or significant osseous findings. IMPRESSION: 1. Exam detail is diminished secondary to respiratory motion artifact. 2. Progressive mediastinal and bilateral hilar adenopathy is identified. Differential considerations include granulomatous inflammation/infection (including sarcoid), lymphoproliferative disorder or metastatic adenopathy. Careful clinical correlation is advised. Follow-up imaging in 3 months with contrast enhanced CT of the chest is recommended. This recommendation follows ACR consensus guidelines: Managing Incidental Findings on Thoracic CT: Mediastinal and Cardiovascular Findings. A White Paper of the ACR Incidental Findings Committee. J Am Coll Radiol. 2018; 15: 5329-9242. 3. 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 4. Aortic Atherosclerosis (ICD10-I70.0) and Emphysema (ICD10-J43.9). 5. Multi vessel coronary artery atherosclerotic calcifications Electronically Signed   By: Kerby Moors M.D.   On: 11/04/2017 12:19      Medications:   . sodium chloride    . ceFEPime (MAXIPIME) IV    . vancomycin     . allopurinol  50 mg Oral QODAY  . irbesartan  300 mg Oral Daily   And  . amLODipine  10 mg Oral Daily  . aspirin EC  81 mg Oral Daily  . calcitRIOL  0.25 mcg Oral Daily  . carvedilol  6.25 mg Oral BID WC  . Chlorhexidine Gluconate Cloth  6 each Topical Q0600  . cholecalciferol  2,000 Units Oral Daily  . citalopram  10 mg Oral Daily  . feeding supplement (NEPRO CARB STEADY)  237 mL Oral BID BM  . fluticasone  2 spray Each Nare Daily  . heparin  5,000 Units Subcutaneous Q8H  . insulin aspart  0-5 Units Subcutaneous QHS  . insulin aspart  0-9 Units Subcutaneous TID WC  . mouth rinse  15 mL Mouth Rinse BID  . multivitamin  1 tablet Oral QHS  . mupirocin ointment  1 application Nasal BID  . sodium chloride flush  3 mL Intravenous Q12H  . tamsulosin  0.4 mg Oral QPC breakfast  . vitamin B-12  1,000 mcg Oral Daily   sodium chloride, acetaminophen **OR** acetaminophen, albuterol, guaiFENesin-dextromethorphan, heparin, hydrALAZINE, HYDROcodone-acetaminophen, ondansetron **OR** ondansetron (ZOFRAN) IV, senna-docusate, sodium chloride flush  Assessment/ Plan:  Joseph Newman is a 72 y.o. black male with end stage renal disease on dialysis, COPD, arthritis, depression, sleep apnea, diabetes mellitus type 2 with neuropathy, hypertension,  and gout  Currently on back up hemodialysis [facility] permcath  Peritoneal dialysis prescription 107 kg/ 4 x 3000 mL, last fill 2000 mL  1.  End-stage renal disease:  TTS -  HD today in chair - UF goal 2.5 L as appears to have some pulmonary congestion  2.  Altered mental status: - improving slowly  3. Anemia of chronic kidney disease:   - EPO with HD treatment.   4. Secondary Hyperparathyroidism: outpatient PTH 156, phosphorus and calcium at goal - not currently on binders.   6. Gout: not in acute flare.  - allopurinol   7. MRSA  screen + 11/02/17   LOS: 1 Celine Dishman 10/14/201911:18 AM

## 2017-11-05 NOTE — Progress Notes (Addendum)
SLP Cancellation Note  Patient Details Name: Jr Milliron MRN: [id number] DOB: [birth year]   Cancelled treatment:      Reviewed chart and spoke with nursing re: toleration of current diet. Nursing reports patient is not eating very much overall and reported intermittent coughing when giving meds whole with thin liquid.  Patient has been off floor most of the day  for hemodialysis and has not consumed any meals yet today. Patient noted with baseline cough. Recommend give meds crushed in puree, and monitor for s/s of aspiration and swallowing difficulty with meals. Nursing agreed. SLP to f/u with toleration of diet.      Barbaraann Avans, MA, CCC-SLP 11/05/2017, 4:06 PM

## 2017-11-05 NOTE — Progress Notes (Signed)
1st attempt to get report prior to HD tx, RN unavailable, RN will call me back to give report

## 2017-11-05 NOTE — Progress Notes (Signed)
Post HD assessment. Pt tolerated tx well without c/o or complication. Pt in chair for tx. MD increased UF goal during HD tx to 2.5L. Net UF 2504, goal met. RN unavailable to give report. RN will call back for post HD report.    11/05/17 1358  Hand-Off documentation  Report given to (Full Name) Jerral Bonito  Report received from (Full Name) Stark Bray  Vital Signs  Temp 97.8 F (36.6 C)  Temp Source Oral  Pulse Rate 78  Pulse Rate Source Monitor  Resp (!) 29  BP 130/63  BP Location Right Arm  BP Method Automatic  Patient Position (if appropriate) Sitting  Oxygen Therapy  SpO2 97 %  O2 Device Nasal Cannula  O2 Flow Rate (L/min) 3 L/min  Dialysis Weight  Weight 92 kg  Type of Weight Post-Dialysis (based on last weight and UF removed, )  Post-Hemodialysis Assessment  Rinseback Volume (mL) 250 mL  KECN 80.3 V  Dialyzer Clearance Lightly streaked  Duration of HD Treatment -hour(s) 3.5 hour(s)  Hemodialysis Intake (mL) 1000 mL  UF Total -Machine (mL) 3504 mL  Net UF (mL) 2504 mL  Tolerated HD Treatment Yes  Education / Care Plan  Dialysis Education Provided Yes  Documented Education in Care Plan Yes  Hemodialysis Catheter Right Subclavian Double-lumen;Permanent  No Placement Date or Time found.   Placed prior to admission: Yes  Orientation: Right  Access Location: Subclavian  Hemodialysis Catheter Type: Double-lumen;Permanent  Site Condition No complications  Blue Lumen Status Heparin locked  Red Lumen Status Heparin locked  Purple Lumen Status N/A  Catheter fill solution Heparin 1000 units/ml  Catheter fill volume (Arterial) 1.5 cc  Catheter fill volume (Venous) 1.5  Dressing Type Biopatch  Dressing Status Clean;Dry;Intact  Drainage Description None  Post treatment catheter status Capped and Clamped

## 2017-11-06 LAB — GLUCOSE, CAPILLARY
GLUCOSE-CAPILLARY: 150 mg/dL — AB (ref 70–99)
GLUCOSE-CAPILLARY: 222 mg/dL — AB (ref 70–99)
Glucose-Capillary: 237 mg/dL — ABNORMAL HIGH (ref 70–99)
Glucose-Capillary: 194 mg/dL — ABNORMAL HIGH (ref 70–99)

## 2017-11-06 MED ORDER — FUROSEMIDE 10 MG/ML IJ SOLN
80.0000 mg | Freq: Once | INTRAMUSCULAR | Status: AC
  Administered 2017-11-06: 80 mg via INTRAVENOUS
  Filled 2017-11-06: qty 8

## 2017-11-06 MED ORDER — METHYLPREDNISOLONE SODIUM SUCC 40 MG IJ SOLR
40.0000 mg | Freq: Every day | INTRAMUSCULAR | Status: DC
  Administered 2017-11-06 – 2017-11-07 (×2): 40 mg via INTRAVENOUS
  Filled 2017-11-06 (×2): qty 1

## 2017-11-06 NOTE — Progress Notes (Addendum)
Speech Language Pathology Treatment: Dysphagia  Patient Details Name: Joseph Newman MRN: [id number] DOB: [birth year] Today's Date: 11/06/2017 Time: 5462-7035 SLP Time Calculation (min) (ACUTE ONLY): 46 min  Assessment / Plan / Recommendation Clinical Impression  Pt seen for ongoing assessment of swallowing and toleration of diet - NSG reported pt has been orally holding foods and pills crushed in puree w/ moderate verbal/tactile cues given to encourage swallowing. Suspect pt has some decline in Cognitive status (MD agreed) which could be impacting his oral phase of swallowing. Pt has not been taking much PO d/t his poor Cognitive engagement. Pt is currently awake sitting in chair post PT session w/ mumbled speech and following simple commands. Noted he often leaned forward in chair but unsure why. Pt was fed trials of thin liquids via cup/straw, purees and softened solids. No overt s/s of aspiration (coughing, throat clearing) were noted w/ the small bolus amounts/trials fed to him - pt did NOT attempt to feed self but held the cup briefly when strongly encouraged for safer swallowing. Pt exhibited increased oral phase time and lengthy (munching) mastication efforts w/ the trials of softened solids; time and alternating food w/ liquids aided oral clearing. Pt exhibited much more timely bolus management, A-P transfer, and oral clearing w/ trials of puree. Again, suspect Cognitive impact.  Recommend modify diet to a Dysphagia level 2(MINCED foods) diet d/t the Oral phase deficits; thin liquids; aspiration precautions. Pills CRUSHED or Whole in ice cream as able; feeding assistance at all meals.  ST services will f/u next 2-3 days for toleration of diet. Recommend this diet consistency for meals to meet nutrition/hydration needs orally. NSG updated; recommend Palliative Care consult; Dietician f/u for nutritional supplements d/t decreased oral intake per NSG report.     HPI HPI: Pt is a 72 y.o. male with a  known history of multiple medical problems including ESRD on hemodialysis, hypertension, diabetes, hyperlipidemia, BPH, sleep apnea, gout, arthritis, etc. the patient is sent to ED from dialysis center due to altered mental status.  The patient does not know why he is sent here.  He denies any symptoms except the cough.  According to his wife, the patient is living in [facility] home.  He has had cough for 4 to 5 days.  He is on hemodialysis today but was found confusion by the nurse and sent to ED.  CAT scan of the head is unremarkable.  Chest x-ray show pulmonary fibrosis or interstitial edema.  ED physician request admission.  He was admitted for CVA work-up last month but no CVA.        SLP Plan  Continue with current plan of care       Recommendations  Diet recommendations: Dysphagia 2 (fine chop);Thin liquid Liquids provided via: Cup;Straw Medication Administration: Whole meds with puree(or Crushed in Puree - maybe ice cream best) Supervision: Staff to assist with self feeding;Full supervision/cueing for compensatory strategies Compensations: Minimize environmental distractions;Slow rate;Small sips/bites;Lingual sweep for clearance of pocketing;Multiple dry swallows after each bite/sip;Follow solids with liquid Postural Changes and/or Swallow Maneuvers: Seated upright 90 degrees;Upright 30-60 min after meal                General recommendations: (dietician f/u) Oral Care Recommendations: Oral care BID;Staff/trained caregiver to provide oral care Follow up Recommendations: Skilled Nursing facility SLP Visit Diagnosis: Dysphagia, oral phase (R13.11);Dysphagia, oropharyngeal phase (R13.12) Plan: Continue with current plan of care       GO  Orinda Kenner, MS, CCC-SLP Amanie Mcculley 11/06/2017, 12:11 PM

## 2017-11-06 NOTE — Progress Notes (Signed)
Patient ID: Joseph Newman, male   DOB: 1945-07-21, 72 y.o.   MRN: [id number]  [facility] PROGRESS NOTE  Joseph Newman OBS:[id number] DOB: 04/02/1945 DOA: 11/01/2017 PCP: Steele Sizer, MD  HPI/Subjective: Patient more talkative today.  Feels okay.  Still has some cough.  Does not complain of shortness of breath.  Objective: Vitals:   11/06/17 0835 11/06/17 1414  BP: (!) 147/72 (!) 120/54  Pulse: 79 78  Resp:  20  Temp:  (!) 97.4 F (36.3 C)  SpO2:  97%    Filed Weights   11/03/17 1516 11/05/17 1008 11/05/17 1358  Weight: 94.1 kg 94 kg 92 kg    ROS: Review of Systems  Respiratory: Positive for cough. Negative for shortness of breath.   Cardiovascular: Negative for chest pain.  Gastrointestinal: Negative for abdominal pain, nausea and vomiting.   Exam: Physical Exam  HENT:  Nose: No mucosal edema.  Mouth/Throat: No oropharyngeal exudate or posterior oropharyngeal edema.  Eyes: Pupils are equal, round, and reactive to light. Conjunctivae, EOM and lids are normal.  Neck: No JVD present. Carotid bruit is not present. No edema present. No thyroid mass and no thyromegaly present.  Cardiovascular: S1 normal and S2 normal. Exam reveals no gallop.  No murmur heard. Pulses:      Dorsalis pedis pulses are 2+ on the right side, and 2+ on the left side.  Respiratory: No respiratory distress. He has decreased breath sounds in the right lower field and the left lower field. He has no wheezes. He has rhonchi in the right lower field and the left lower field. He has no rales.  GI: Soft. Bowel sounds are normal. There is no tenderness.  Musculoskeletal:       Right ankle: He exhibits no swelling.       Left ankle: He exhibits no swelling.  Lymphadenopathy:    He has no cervical adenopathy.  Neurological: He is alert. No cranial nerve deficit.  Skin: Skin is warm. No rash noted. Nails show no clubbing.  Psychiatric: He has a normal mood and affect.      Data Reviewed: Basic  Metabolic Panel: Recent Labs  Lab 11/01/17 1542 11/02/17 0546 11/03/17 0525 11/05/17 1056  NA 139 138 137 142  K 3.0* 2.9* 2.9* 3.4*  CL 100 101 100 99  CO2 30 28 27  32  GLUCOSE 149* 191* 190* 176*  BUN 15 21 28* 30*  CREATININE 1.85* 2.58* 3.48* 4.60*  CALCIUM 8.4* 8.1* 8.4* 8.8*  PHOS  --   --   --  2.7   Liver Function Tests: Recent Labs  Lab 11/01/17 1542 11/05/17 1056  AST 14*  --   ALT 10  --   ALKPHOS 71  --   BILITOT 0.7  --   PROT 6.8  --   ALBUMIN 2.5* 2.2*   CBC: Recent Labs  Lab 11/01/17 1542 11/02/17 0546  WBC 10.1 8.5  HGB 10.1* 9.4*  HCT 30.2* 28.4*  MCV 83.0 82.6  PLT 223 205   Cardiac Enzymes: Recent Labs  Lab 11/01/17 1542  TROPONINI 0.06*    CBG: Recent Labs  Lab 11/05/17 0759 11/05/17 1706 11/05/17 2103 11/06/17 0800 11/06/17 1207  GLUCAP 184* 134* 171* 150* 237*    Recent Results (from the past 240 hour(s))  MRSA PCR Screening     Status: Abnormal   Collection Time: 11/02/17  3:06 AM  Result Value Ref Range Status   MRSA by PCR POSITIVE (A) NEGATIVE Final    Comment:  The GeneXpert MRSA Assay (FDA approved for NASAL specimens only), is one component of a comprehensive MRSA colonization surveillance program. It is not intended to diagnose MRSA infection nor to guide or monitor treatment for MRSA infections. RESULT CALLED TO, READ BACK BY AND VERIFIED WITH: C/SARA [facility] @0510  11/02/17 [facility] Performed at [facility], [address] [place], [place] [address]      Scheduled Meds: . allopurinol  50 mg Oral QODAY  . irbesartan  300 mg Oral Daily   And  . amLODipine  10 mg Oral Daily  . aspirin EC  81 mg Oral Daily  . calcitRIOL  0.25 mcg Oral Daily  . carvedilol  6.25 mg Oral BID WC  . cholecalciferol  2,000 Units Oral Daily  . citalopram  10 mg Oral Daily  . feeding supplement (NEPRO CARB STEADY)  237 mL Oral BID BM  . fluticasone  2 spray Each Nare Daily  . heparin  5,000 Units Subcutaneous  Q8H  . insulin aspart  0-5 Units Subcutaneous QHS  . insulin aspart  0-9 Units Subcutaneous TID WC  . mouth rinse  15 mL Mouth Rinse BID  . multivitamin  1 tablet Oral QHS  . mupirocin ointment  1 application Nasal BID  . sodium chloride flush  3 mL Intravenous Q12H  . tamsulosin  0.4 mg Oral QPC breakfast  . vitamin B-12  1,000 mcg Oral Daily   Continuous Infusions: . sodium chloride    . ceFEPime (MAXIPIME) IV 1 g (11/05/17 1530)  . vancomycin      Assessment/Plan:  1. Acute hypoxic respiratory failure.  Abnormal CT scan of the chest with increased hilar adenopathy cannot rule out infectious versus inflammatory versus malignant etiologies.  We will get more aggressive with antibiotics with vancomycin and cefepime. Oxygen supplementation. 2. Acute encephalopathy.  Patient likely has underlying dementia.  Mental status seems improved today after aggressive antibiotics. 3. End-stage renal disease.  Hemodialysis yesterday.  Patient likely would not be a candidate for PD dialysis at home.  Patient will be on Tuesday Thursday Saturday schedule. 4. Weakness.  Physical therapy still recommends rehab 5. Hypokalemia.  Dialysis to manage 6. Essential hypertension on Norvasc Avapro and Coreg 7. Hyperlipidemia.  Hold his atorvastatin since the patient has acute encephalopathy. 8. Depression on Celexa 9. History of gout on allopurinol   Code Status:     Code Status Orders  (From admission, onward)         Start     Ordered   11/01/17 1836  Do not attempt resuscitation (DNR)  Continuous    Question Answer Comment  In the event of cardiac or respiratory ARREST Do not call a "code blue"   In the event of cardiac or respiratory ARREST Do not perform Intubation, CPR, defibrillation or ACLS   In the event of cardiac or respiratory ARREST Use medication by any route, position, wound care, and other measures to relive pain and suffering. May use oxygen, suction and manual treatment of airway  obstruction as needed for comfort.      11/01/17 1835        Code Status History    Date Active Date Inactive Code Status Order ID Comments User Context   09/25/2017 1616 09/29/2017 2043 Full Code [id number]  Epifanio Lesches, MD ED    Advance Directive Documentation     Most Recent Value  Type of Advance Directive  Healthcare Power of Attorney, Living will  Pre-existing out of facility DNR order (yellow  form or pink MOST form)  -  "MOST" Form in Place?  -     Family Communication: Left message for wife on the phone Disposition Plan: Hopefully will be able to discharge in the next day or so  Consultants:  Nephrology  Consult pulmonary  Antibiotics: Vancomycin Cefepime  Time spent: 26 minutes  [place]

## 2017-11-06 NOTE — Progress Notes (Signed)
Physical Therapy Treatment Patient Details Name: Joseph Newman MRN: [id number] DOB: 13-Mar-1945 Today's Date: 11/06/2017    History of Present Illness Pt is a 72 y/o M who was sent to the ED from dialysis center due to [place].  Pt has had a cough for 4-5 days.  CAT scan of head unremarkable.  Chest x-ray show pulmonary fibrosis or interstitial edema. Pt with abnormal CT scan of the chest with increased  hilar adenopathy cannot rule out infectious versus inflammatory versus malignant etiologies.  Pt's PMH includes CKD, venous insufficiency.    PT Comments    Mr. Lievanos was agreeable to therapy but was minimally communicative.  Pt oriented to self only.  Pt not responding to cues consistently and requires multimodal cues for all aspects of mobility.  Pt requires mod +2 assist for supine>sit, sit>stand, and stand pivot transfers.  Unable to safely attempt gait training this session due to pt's instability and fatigue with stand pivot transfer.  Follow up recommendation for SNF remains appropriate.     Follow Up Recommendations  SNF     Equipment Recommendations  Other (comment)(TBD at next venue of care)    Recommendations for Other Services       Precautions / Restrictions Precautions Precautions: Fall;Other (comment) Precaution Comments: monitor O2 Restrictions Weight Bearing Restrictions: No    Mobility  Bed Mobility Overal bed mobility: Needs Assistance Bed Mobility: Supine to Sit     Supine to sit: Mod assist;+2 for physical assistance;HOB elevated     General bed mobility comments: Cues for sequencing and for use of bed rail.  Assist to advance LEs to EOB and to elevate trunk.  Use of bed pad to scoot pt to EOB prior to sit>stand as pt not following instructions for scooting.   Transfers Overall transfer level: Needs assistance Equipment used: Rolling walker (2 wheeled) Transfers: Stand Pivot Transfers;Sit to/from Stand Sit to Stand: Mod assist;+2 physical assistance;From  elevated surface Stand pivot transfers: Mod assist;+2 physical assistance       General transfer comment: Bed slightly elevated as pt very tall.  Cues for proper hand placement and for anterior lean which pt does not follow.  Assist to boost to standing.  Once standing, cues to move L hand to RW handle as pt grasps to front bar of RW.  To pivot pt requires step by step cues for sequencing, assist with RW management, and assist to remain steady.  Pt attempts to sit prematurely before fully turning. Assist for controlled descent to sit.   Ambulation/Gait             General Gait Details: Not safe to attempt at this time   Stairs             Wheelchair Mobility    Modified Rankin (Stroke Patients Only)       Balance Overall balance assessment: Needs assistance Sitting-balance support: Single extremity supported;Feet supported Sitting balance-Leahy Scale: Poor Sitting balance - Comments: Relies on at least 1UE support sitting EOB   Standing balance support: Bilateral upper extremity supported;During functional activity Standing balance-Leahy Scale: Poor Standing balance comment: Relies on RW and outisde physical assist to remain steady with static and dynamic activities                            Cognition Arousal/Alertness: Awake/alert Behavior During Therapy: Flat affect Overall Cognitive Status: No family/caregiver present to determine baseline cognitive functioning  General Comments: Pt slow to respond to questions and does not always respond to questions.  Pt oriented to self only.       Exercises      General Comments General comments (skin integrity, edema, etc.): No exercises completed this session as pt requests to be left alone after stand pivot transfer      Pertinent Vitals/Pain Pain Assessment: No/denies pain Pain Location: No signs of pain and pt denies pain this session (able to lift LUE  without pain).     Home Living                      Prior Function            PT Goals (current goals can now be found in the care plan section) Acute Rehab PT Goals Patient Stated Goal: none stated PT Goal Formulation: Patient unable to participate in goal setting Time For Goal Achievement: 11/19/17 Potential to Achieve Goals: Fair Progress towards PT goals: Progressing toward goals    Frequency    Min 2X/week      PT Plan Current plan remains appropriate    Co-evaluation              AM-PAC PT "6 Clicks" Daily Activity  Outcome Measure  Difficulty turning over in bed (including adjusting bedclothes, sheets and blankets)?: Unable Difficulty moving from lying on back to sitting on the side of the bed? : Unable Difficulty sitting down on and standing up from a chair with arms (e.g., wheelchair, bedside commode, etc,.)?: Unable Help needed moving to and from a bed to chair (including a wheelchair)?: A Lot Help needed walking in hospital room?: A Lot Help needed climbing 3-5 steps with a railing? : Total 6 Click Score: 8    End of Session Equipment Utilized During Treatment: Oxygen;Gait belt Activity Tolerance: Patient limited by fatigue Patient left: with call bell/phone within reach;in chair;with chair alarm set Nurse Communication: Mobility status PT Visit Diagnosis: Muscle weakness (generalized) (M62.81);Difficulty in walking, not elsewhere classified (R26.2)     Time: 7902-4097 PT Time Calculation (min) (ACUTE ONLY): 21 min  Charges:  $Therapeutic Activity: 8-22 mins                     Collie Siad PT, DPT 11/06/2017, 9:40 AM

## 2017-11-06 NOTE — Progress Notes (Signed)
[facility]  ROUNDING NOTE   Subjective:   Appetite remains poor.  Appears to be more alert and sitting in chair. Chest CT shows lymphadenopathy Underwent dialysis yesterday seated in chair.  Tolerated well.  2500 cc of fluid was removed.  Speech therapy assessment this morning.    Objective:  Vital signs in last 24 hours:  Temp:  [97.6 F (36.4 C)-98.4 F (36.9 C)] 97.6 F (36.4 C) (10/15 0531) Pulse Rate:  [74-86] 79 (10/15 0835) Resp:  [19-36] 19 (10/15 0531) BP: (102-158)/(51-84) 147/72 (10/15 0835) SpO2:  [91 %-99 %] 93 % (10/15 0531) Weight:  [92 kg] 92 kg (10/14 1358)  Weight change:  Filed Weights   11/03/17 1516 11/05/17 1008 11/05/17 1358  Weight: 94.1 kg 94 kg 92 kg    Intake/Output: I/O last 3 completed shifts: In: 0  Out: 2504 [[id number]:[id number]]   Intake/Output this shift:  No intake/output data recorded.     Physical Exam: General: NAD, seated in chair  Head:  Moist oral mucosal membranes  Eyes: Anicteric,   Neck: Supple,   Lungs:  B/l coarse breath sounds, no crackles  Heart: Regular rate and rhythm  Abdomen:  Soft, nontender,   Extremities: no peripheral edema.  Neurologic: answers few questions  Skin: No lesions  Access: RIJ permcath, PD catheter    Basic Metabolic Panel: Recent Labs  Lab 11/01/17 1542 11/02/17 0546 11/03/17 0525 11/05/17 1056  NA 139 138 137 142  K 3.0* 2.9* 2.9* 3.4*  CL 100 101 100 99  CO2 30 28 27  32  GLUCOSE 149* 191* 190* 176*  BUN 15 21 28* 30*  CREATININE 1.85* 2.58* 3.48* 4.60*  CALCIUM 8.4* 8.1* 8.4* 8.8*  PHOS  --   --   --  2.7    Liver Function Tests: Recent Labs  Lab 11/01/17 1542 11/05/17 1056  AST 14*  --   ALT 10  --   ALKPHOS 71  --   BILITOT 0.7  --   PROT 6.8  --   ALBUMIN 2.5* 2.2*   No results for input(s): LIPASE, AMYLASE in the last 168 hours. No results for input(s): AMMONIA in the last 168 hours.  CBC: Recent Labs  Lab 11/01/17 1542 11/02/17 0546  WBC 10.1 8.5   HGB 10.1* 9.4*  HCT 30.2* 28.4*  MCV 83.0 82.6  PLT 223 205    Cardiac Enzymes: Recent Labs  Lab 11/01/17 1542  TROPONINI 0.06*    BNP: Invalid input(s): POCBNP  CBG: Recent Labs  Lab 11/04/17 2059 11/05/17 0759 11/05/17 1706 11/05/17 2103 11/06/17 0800  GLUCAP 160* 184* 134* 171* 150*    Microbiology: Results for orders placed or performed during the hospital encounter of 11/01/17  MRSA PCR Screening     Status: Abnormal   Collection Time: 11/02/17  3:06 AM  Result Value Ref Range Status   MRSA by PCR POSITIVE (A) NEGATIVE Final    Comment:        The GeneXpert MRSA Assay (FDA approved for NASAL specimens only), is one component of a comprehensive MRSA colonization surveillance program. It is not intended to diagnose MRSA infection nor to guide or monitor treatment for MRSA infections. RESULT CALLED TO, READ BACK BY AND VERIFIED WITH: C/SARA [facility] @0510  11/02/17 [facility] Performed at [facility], [address] [place], [place] [address]     Coagulation Studies: No results for input(s): LABPROT, INR in the last 72 hours.  Urinalysis: No results for input(s): COLORURINE, LABSPEC, [place], [place],  HGBUR, BILIRUBINUR, KETONESUR, PROTEINUR, UROBILINOGEN, NITRITE, LEUKOCYTESUR in the last 72 hours.  Invalid input(s): APPERANCEUR    Imaging: Ct Chest W Contrast  Result Date: 11/04/2017 CLINICAL DATA:  Persistent cough and shortness of breath. EXAM: CT CHEST WITH CONTRAST TECHNIQUE: Multidetector CT imaging of the chest was performed during intravenous contrast administration. CONTRAST:  48mL OMNIPAQUE IOHEXOL 300 MG/ML  SOLN COMPARISON:  CT chest 8/11/9 FINDINGS: Cardiovascular: Mild cardiac enlargement. No pericardial effusion identified. Aortic atherosclerosis. Calcification within the RCA, LAD coronary arteries noted. The main pulmonary artery measures 4.1 cm in diameter. Mediastinum/Nodes: Multinodular thyroid gland identified. The trachea  appears patent and is midline. Progressive mediastinal and hilar adenopathy is identified. Index left pre-vascular lymph node measures 1.7 cm, image 49/2. Previously 1.1 cm. Adjacent pre-vascular node measures 1.5 cm, image 51/2. Previously 0.6 cm. Right hilar lymph node measures 2.1 cm, image 68/2. Previously 1.0 cm. Left hilar lymph node measures 1.7 cm, image 78/2. Previously 1.0 cm. Lungs/Pleura: Extensive respiratory motion artifact diminishes exam detail. No pleural effusion. Moderate progressive changes of emphysema. Pleural thickening along the lateral and posterior left mid and left lower lung identified. No airspace consolidation, atelectasis or pneumothorax identified. Upper Abdomen: No acute abnormality. Musculoskeletal: No chest wall abnormality. No acute or significant osseous findings. IMPRESSION: 1. Exam detail is diminished secondary to respiratory motion artifact. 2. Progressive mediastinal and bilateral hilar adenopathy is identified. Differential considerations include granulomatous inflammation/infection (including sarcoid), lymphoproliferative disorder or metastatic adenopathy. Careful clinical correlation is advised. Follow-up imaging in 3 months with contrast enhanced CT of the chest is recommended. This recommendation follows ACR consensus guidelines: Managing Incidental Findings on Thoracic CT: Mediastinal and Cardiovascular Findings. A White Paper of the ACR Incidental Findings Committee. J Am Coll Radiol. 2018; 15: 2505-3976. 3. 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 4. Aortic Atherosclerosis (ICD10-I70.0) and Emphysema (ICD10-J43.9). 5. Multi vessel coronary artery atherosclerotic calcifications Electronically Signed   By: Kerby Moors M.D.   On: 11/04/2017 12:19     Medications:   . sodium chloride    . ceFEPime (MAXIPIME) IV 1 g (11/05/17 1530)  . vancomycin     . allopurinol  50 mg Oral QODAY  . irbesartan  300 mg Oral Daily   And   . amLODipine  10 mg Oral Daily  . aspirin EC  81 mg Oral Daily  . calcitRIOL  0.25 mcg Oral Daily  . carvedilol  6.25 mg Oral BID WC  . cholecalciferol  2,000 Units Oral Daily  . citalopram  10 mg Oral Daily  . feeding supplement (NEPRO CARB STEADY)  237 mL Oral BID BM  . fluticasone  2 spray Each Nare Daily  . heparin  5,000 Units Subcutaneous Q8H  . insulin aspart  0-5 Units Subcutaneous QHS  . insulin aspart  0-9 Units Subcutaneous TID WC  . mouth rinse  15 mL Mouth Rinse BID  . multivitamin  1 tablet Oral QHS  . mupirocin ointment  1 application Nasal BID  . sodium chloride flush  3 mL Intravenous Q12H  . tamsulosin  0.4 mg Oral QPC breakfast  . vitamin B-12  1,000 mcg Oral Daily   sodium chloride, acetaminophen **OR** acetaminophen, albuterol, guaiFENesin-dextromethorphan, hydrALAZINE, HYDROcodone-acetaminophen, ondansetron **OR** ondansetron (ZOFRAN) IV, senna-docusate, sodium chloride flush, vancomycin  Assessment/ Plan:  [name] is a 72 y.o. black male with end stage renal disease on dialysis, COPD, arthritis, depression, sleep apnea, diabetes mellitus type 2 with neuropathy, hypertension,  and gout  Currently  on back up hemodialysis [facility] permcath  Peritoneal dialysis prescription 4 x 3000 mL, last fill 2000 mL  1.  End-stage renal disease:  TTS -Tolerated hemodialysis in chair on Monday.  2500 cc removed -Next hemodialysis planned for Thursday  2.  Altered mental status: - improving slowly -Appears to be more alert today and able to answer questions  3. Anemia of chronic kidney disease:   - EPO with HD treatment.   4. Secondary Hyperparathyroidism: outpatient PTH 156, phosphorus and calcium at goal - not currently on binders.   6. Gout: not in acute flare.  - allopurinol   7. MRSA screen + 11/02/17   LOS: 2 Spurgeon Gancarz Candiss Norse 10/15/201911:07 AM

## 2017-11-06 NOTE — Clinical Social Work Note (Signed)
CSW spoke with wife at beside and gave bed offers. Patient's wife chose [facility]. CSW notified [name] at [facility] of bed acceptance. [name] will begin [facility] authorization. CSW will continue to follow for discharge planning.   [place], [place]

## 2017-11-07 DIAGNOSIS — Z515 Encounter for palliative care: Secondary | ICD-10-CM

## 2017-11-07 DIAGNOSIS — Z7189 Other specified counseling: Secondary | ICD-10-CM

## 2017-11-07 DIAGNOSIS — R4182 Altered mental status, unspecified: Secondary | ICD-10-CM

## 2017-11-07 LAB — CBC
HCT: 31.2 % — ABNORMAL LOW (ref 39.0–52.0)
Hemoglobin: 10 g/dL — ABNORMAL LOW (ref 13.0–17.0)
MCH: 26.9 pg (ref 26.0–34.0)
MCHC: 32.1 g/dL (ref 30.0–36.0)
MCV: 83.9 fL (ref 80.0–100.0)
Platelets: 289 10*3/uL (ref 150–400)
RBC: 3.72 MIL/uL — AB (ref 4.22–5.81)
RDW: 15.3 % (ref 11.5–15.5)
WBC: 7.1 10*3/uL (ref 4.0–10.5)
nRBC: 0 % (ref 0.0–0.2)

## 2017-11-07 LAB — GLUCOSE, CAPILLARY
GLUCOSE-CAPILLARY: 224 mg/dL — AB (ref 70–99)
GLUCOSE-CAPILLARY: 291 mg/dL — AB (ref 70–99)
GLUCOSE-CAPILLARY: 320 mg/dL — AB (ref 70–99)

## 2017-11-07 MED ORDER — FLUTICASONE PROPIONATE 50 MCG/ACT NA SUSP: 2.0000 | Freq: Every day | NASAL | 0 refills | Status: DC

## 2017-11-07 MED ORDER — IRBESARTAN 150 MG PO TABS
300.0000 mg | ORAL_TABLET | Freq: Every day | ORAL | Status: DC
  Administered 2017-11-07: 300 mg via ORAL

## 2017-11-07 MED ORDER — ACETAMINOPHEN 325 MG PO TABS: 650.0000 mg | ORAL_TABLET | Freq: Four times a day (QID) | ORAL | Status: DC | PRN

## 2017-11-07 MED ORDER — AMLODIPINE BESYLATE 5 MG PO TABS
5.0000 mg | ORAL_TABLET | Freq: Every day | ORAL | Status: DC
  Administered 2017-11-07: 5 mg via ORAL

## 2017-11-07 MED ORDER — INSULIN ASPART 100 UNIT/ML ~~LOC~~ SOLN: SUBCUTANEOUS | 11 refills | Status: DC

## 2017-11-07 MED ORDER — RENA-VITE PO TABS: 1.0000 | ORAL_TABLET | Freq: Every day | ORAL | 0 refills | Status: DC

## 2017-11-07 MED ORDER — VANCOMYCIN HCL IN DEXTROSE 1-5 GM/200ML-% IV SOLN: 1000.0000 mg | INTRAVENOUS | Status: DC

## 2017-11-07 MED ORDER — INSULIN DETEMIR 100 UNIT/ML ~~LOC~~ SOLN: 5.0000 [IU] | Freq: Every day | SUBCUTANEOUS | 0 refills | Status: DC

## 2017-11-07 MED ORDER — GUAIFENESIN-DM 100-10 MG/5ML PO SYRP: 5.0000 mL | ORAL_SOLUTION | ORAL | 0 refills | Status: DC | PRN

## 2017-11-07 MED ORDER — INSULIN DETEMIR 100 UNIT/ML ~~LOC~~ SOLN
5.0000 [IU] | Freq: Every day | SUBCUTANEOUS | Status: DC
  Filled 2017-11-07: qty 0.05

## 2017-11-07 MED ORDER — IRBESARTAN 300 MG PO TABS: 300.0000 mg | ORAL_TABLET | Freq: Every day | ORAL | 0 refills | Status: DC

## 2017-11-07 MED ORDER — LEVOFLOXACIN 250 MG PO TABS
250.0000 mg | ORAL_TABLET | Freq: Once | ORAL | Status: AC
  Administered 2017-11-07: 250 mg via ORAL
  Filled 2017-11-07: qty 1

## 2017-11-07 MED ORDER — NEPRO/CARBSTEADY PO LIQD: 237.0000 mL | Freq: Three times a day (TID) | ORAL | Status: DC

## 2017-11-07 MED ORDER — PREDNISONE 10 MG PO TABS: ORAL_TABLET | ORAL | 0 refills | Status: DC

## 2017-11-07 MED ORDER — NEPRO/CARBSTEADY PO LIQD: 237.0000 mL | Freq: Three times a day (TID) | ORAL | 0 refills | Status: DC

## 2017-11-07 MED ORDER — AMLODIPINE BESYLATE 5 MG PO TABS: 5.0000 mg | ORAL_TABLET | Freq: Every day | ORAL | 0 refills | Status: DC

## 2017-11-07 MED ORDER — ALBUTEROL SULFATE (2.5 MG/3ML) 0.083% IN NEBU: 2.5000 mg | INHALATION_SOLUTION | Freq: Four times a day (QID) | RESPIRATORY_TRACT | 0 refills | Status: DC

## 2017-11-07 MED ORDER — LEVOFLOXACIN 500 MG PO TABS: ORAL_TABLET | ORAL | 0 refills | Status: DC

## 2017-11-07 MED ORDER — OXYCODONE-ACETAMINOPHEN 5-325 MG PO TABS: 1.0000 | ORAL_TABLET | Freq: Three times a day (TID) | ORAL | 0 refills | Status: DC | PRN

## 2017-11-07 NOTE — Consult Note (Signed)
Consultation Note Date: 11/07/2017   Patient Name: Joseph Newman  DOB: Jun 23, 1945  MRN: [id number]  Age / Sex: 72 y.o., male  PCP: Steele Sizer, MD Referring Physician: Loletha Grayer, MD  Reason for Consultation: Establishing goals of care  HPI/Patient Profile:  Per EMR, Joseph Newman  is a 72 y.o. male with a known history of multiple medical problems including ESRD on hemodialysis, hypertension, diabetes, hyperlipidemia, BPH, sleep apnea, gout, arthritis, etc. the patient is sent to ED from dialysis center due to altered mental status.  The patient does not know why he is sent here.  He denies any symptoms except the cough.  According to his wife, the patient is living in [place] home.  He has had cough for 4 to 5 days.  He is on hemodialysis today but was found confusion by the nurse and sent to ED.   Clinical Assessment and Goals of Care: Patient is resting in bed. Wife and son are at bedside. Joseph Newman is confused. He is retired from [employer].   Wife [name] states he has been "winding down" for the past 1-2 years. Earlier this year he would go to MD appts and walk back and forth through the house. He has sharply declined since previous admission for gout with subsequent transfer to rehab. His appetite has been poor. He has lost around 30 pounds in the last few months. He began PD in July.   We discussed his diagnosis, prognosis, GOC, EOL wishes disposition and options.  A detailed discussion was had today regarding advanced directives.  Concepts specific to code status, artifical feeding and hydration, IV antibiotics and rehospitalization were discussed.  The difference between an aggressive medical intervention path and a comfort care path was discussed.  Values and goals of care important to patient and family were attempted to be elicited.  [name] states he has advanced directives. She  states he has told her he is tired. His quality of life has suffered. She would like for him to be able to weigh in on his plans moving forward, though he is now confused. She would like to treat the treatable. She recognizes he may continue to decline in which case they would want to focus on comfort.    I completed a MOST form today. The family outlined their wishes for the following treatment decisions:  Cardiopulmonary Resuscitation: Do Not Attempt Resuscitation (DNR/No CPR)  Medical Interventions: Limited Additional Interventions: Use medical treatment, IV fluids and cardiac monitoring as indicated, DO NOT USE intubation or mechanical ventilation. May consider use of less invasive airway support such as BiPAP or CPAP. Also provide comfort measures. Transfer to the hospital if indicated. Avoid intensive care.   Antibiotics: Antibiotics if indicated  IV Fluids: IV fluids if indicated  Feeding Tube: No feeding tube          SUMMARY OF RECOMMENDATIONS    Recommend D/C with palliative with transition to hospice when ready.    Code Status/Advance Care Planning:  DNR   Prognosis:  Poor overall. Poor appetite. Pulmonary fibrosis. ESRD.   Discharge Planning: [facility] for rehab with Palliative care service follow-up      Primary Diagnoses: Present on Admission: . Acute metabolic encephalopathy . Shortness of breath   I have reviewed the medical record, interviewed the patient and family, and examined the patient. The following aspects are pertinent.  Past Medical History:  Diagnosis Date  . Arthritis   . Chronic airway obstruction, not elsewhere classified   . 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 Holley Raring, nephrologist  . Depressive disorder, not elsewhere classified   . Heart murmur   . Hyperlipidemia   . Hypertension   . Hypertrophy of prostate without urinary obstruction and other lower urinary tract symptoms (LUTS)   .  Impotence of organic origin   . Microalbuminuria   . 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 Obesity, unspecified   . Plantar wart   . Sleep apnea   . Synovitis and tenosynovitis, unspecified   . Type II or unspecified type diabetes mellitus without mention of complication, uncontrolled   . Unspecified venous (peripheral) insufficiency    Social History   Socioeconomic History  . Marital status: Married    Spouse name: Not on file  . Number of children: 2  . Years of education: Not on file  . Highest education level: Not on file  Occupational History  . Not on file  Social Needs  . Financial resource strain: Patient refused  . Food insecurity:    Worry: Patient refused    Inability: Patient refused  . Transportation needs:    Medical: Patient refused    Non-medical: Patient refused  Tobacco Use  . Smoking status: Current Every Day Smoker    Packs/day: 0.25    Years: 30.00    Pack years: 7.50    Types: Cigarettes, Cigars    Last attempt to quit: 02/25/2003    Years since quitting: 14.7  . Smokeless tobacco: Never Used  . Tobacco comment: he is cutting down, used to smoke one pack day   Substance and Sexual Activity  . Alcohol use: Yes    Alcohol/week: 0.0 standard drinks    Comment: occasional  . Drug use: No  . Sexual activity: Not Currently  Lifestyle  . Physical activity:    Days per week: Patient refused    Minutes per session: Patient refused  . Stress: Patient refused  Relationships  . Social connections:    Talks on phone: Patient refused    Gets together: Patient refused    Attends religious service: Patient refused    Active member of club or organization: Patient refused    Attends meetings of clubs or organizations: Patient refused    Relationship status: Patient refused  Other Topics Concern  . Not on file  Social History Narrative   Lives with wife   Family History  Problem Relation Age of Onset  . Hypertension Mother   . Kidney  disease Neg Hx   . Prostate cancer Neg Hx    Scheduled Meds: . allopurinol  50 mg Oral QODAY  . amLODipine  5 mg Oral Daily   And  . irbesartan  300 mg Oral Daily  . aspirin EC  81 mg Oral Daily  . calcitRIOL  0.25 mcg Oral Daily  . carvedilol  6.25 mg Oral BID WC  . cholecalciferol  2,000 Units Oral Daily  . citalopram  10 mg Oral Daily  .  feeding supplement (NEPRO CARB STEADY)  237 mL Oral TID BM  . fluticasone  2 spray Each Nare Daily  . heparin  5,000 Units Subcutaneous Q8H  . insulin aspart  0-5 Units Subcutaneous QHS  . insulin aspart  0-9 Units Subcutaneous TID WC  . insulin detemir  5 Units Subcutaneous QHS  . levofloxacin  250 mg Oral Once  . mouth rinse  15 mL Mouth Rinse BID  . methylPREDNISolone (SOLU-MEDROL) injection  40 mg Intravenous Daily  . multivitamin  1 tablet Oral QHS  . sodium chloride flush  3 mL Intravenous Q12H  . tamsulosin  0.4 mg Oral QPC breakfast  . vitamin B-12  1,000 mcg Oral Daily   Continuous Infusions: . sodium chloride Stopped (11/06/17 1750)   PRN Meds:.sodium chloride, acetaminophen **OR** acetaminophen, albuterol, guaiFENesin-dextromethorphan, hydrALAZINE, HYDROcodone-acetaminophen, ondansetron **OR** ondansetron (ZOFRAN) IV, senna-docusate, sodium chloride flush Medications Prior to Admission:  Prior to Admission medications   Medication Sig Start Date End Date Taking? Authorizing Provider  allopurinol (ZYLOPRIM) 100 MG tablet Take 0.5 tablets (50 mg total) by mouth every other day. 09/30/17  Yes Gladstone Lighter, MD  aspirin EC 81 MG tablet Take 81 mg by mouth daily.   Yes [provider]  carvedilol (COREG) 6.25 MG tablet Take 6.25 mg by mouth 2 (two) times daily with a meal.  04/05/15  Yes Lateef, Munsoor, MD  citalopram (CELEXA) 10 MG tablet Take 10 mg by mouth daily. 09/06/17  Yes [provider]  Dulaglutide (TRULICITY) 1.5 JO/8.7OM SOPN INJECT 1.5 MG SUBCUTANEOUSLY EVERY 7 (SEVEN) DAYS. 10/02/16  Yes [provider]  Olmesartan-amLODIPine-HCTZ 40-10-25 MG TABS Take 1 tablet by mouth daily. 06/15/17  Yes Sowles, Drue Stager, MD  tamsulosin (FLOMAX) 0.4 MG CAPS capsule Take 0.4 mg by mouth daily after breakfast.    Yes [provider]  acetaminophen (TYLENOL) 325 MG tablet Take 2 tablets (650 mg total) by mouth every 6 (six) hours as needed for mild pain (or Fever >/= 101). 11/07/17   Loletha Grayer, MD  albuterol (PROVENTIL) (2.5 MG/3ML) 0.083% nebulizer solution Take 3 mLs (2.5 mg total) by nebulization every 6 (six) hours. 11/07/17   Loletha Grayer, MD  amLODipine (NORVASC) 5 MG tablet Take 1 tablet (5 mg total) by mouth daily. 11/08/17   Loletha Grayer, MD  atorvastatin (LIPITOR) 40 MG tablet Take 1 tablet (40 mg total) by mouth daily. 06/15/17   Steele Sizer, MD  Blood Glucose Monitoring Suppl (ONE TOUCH ULTRA SYSTEM KIT) W/DEVICE KIT 1 kit by Does not apply route once.    [provider]  calcitRIOL (ROCALTROL) 0.25 MCG capsule Take 0.25 mcg by mouth daily.  04/02/17   [provider]  Cholecalciferol (VITAMIN D) 2000 UNITS CAPS Take 1 capsule by mouth daily.     [provider]  Cyanocobalamin (VITAMIN B-12) 1000 MCG SUBL Take 1 tablet by mouth daily.    [provider]  fluticasone (FLONASE) 50 MCG/ACT nasal spray Place 2 sprays into both nostrils daily. 11/08/17   Loletha Grayer, MD  guaiFENesin-dextromethorphan (ROBITUSSIN DM) 100-10 MG/5ML syrup Take 5 mLs by mouth every 4 (four) hours as needed for cough. 11/07/17   Loletha Grayer, MD  insulin aspart (NOVOLOG) 100 UNIT/ML injection 3 units with meals if eating 11/07/17   Loletha Grayer, MD  insulin detemir (LEVEMIR) 100 UNIT/ML injection Inject 0.05 mLs (5 Units total) into the skin at bedtime. 11/07/17   Loletha Grayer, MD  irbesartan (AVAPRO) 300 MG tablet Take 1 tablet (300 mg  total) by mouth daily. 11/08/17   Loletha Grayer, MD  levofloxacin (LEVAQUIN) 500 MG tablet every 48  hours after dailysis 11/08/17   Loletha Grayer, MD  multivitamin (RENA-VIT) TABS tablet Take 1 tablet by mouth at bedtime. 11/07/17   Loletha Grayer, MD  Nutritional Supplements (FEEDING SUPPLEMENT, NEPRO CARB STEADY,) LIQD Take 237 mLs by mouth 3 (three) times daily between meals. 11/07/17   Loletha Grayer, MD  oxyCODONE-acetaminophen (PERCOCET/ROXICET) 5-325 MG tablet Take 1 tablet by mouth every 8 (eight) hours as needed for moderate pain or severe pain. 11/07/17   Loletha Grayer, MD  predniSONE (DELTASONE) 10 MG tablet 4 tabs p.o. daily for 3 days, 3-1/2 tab daily for 3 days, 3 tablets daily for 3 days, 2-1/2 tablets for 3 days, 2 tablets daily for 3 days and continued until seeing pulmonology. 11/07/17   Loletha Grayer, MD  sodium bicarbonate 650 MG tablet TAKE TWO (2) TABLETS BY MOUTH TWICE A DAY 03/27/16   [provider]   No Known Allergies Review of Systems  Unable to perform ROS   Physical Exam  Constitutional: No distress.  Pulmonary/Chest: Effort normal.  Neurological: He is alert.  Does not speak to this Probation officer.   Skin: Skin is warm and dry.    Vital Signs: BP (!) 117/55 (BP Location: Left Arm)   Pulse 76   Temp 98.2 F (36.8 C) (Oral)   Resp 17   Ht _0  (1.956 m)   Wt 92 kg   SpO2 95%   BMI 24.05 kg/m  Pain Scale: 0-10 POSS *See Group Information*: S-Acceptable,Sleep, easy to arouse Pain Score: 0-No pain   SpO2: SpO2: 95 % O2 Device:SpO2: 95 % O2 Flow Rate: .O2 Flow Rate (L/min): 2 L/min  IO: Intake/output summary:   Intake/Output Summary (Last 24 hours) at 11/07/2017 1540 Last data filed at 11/07/2017 0300 Gross per 24 hour  Intake 100.4 ml  Output -  Net 100.4 ml    LBM: Last BM Date: 11/06/17 Baseline Weight: Weight: 94.1 kg Most recent weight: Weight: 92 kg     Palliative Assessment/Data: 30%     Time In: 2:10 Time Out: 3:00 Time Total: 50 min Greater than 50%  of this time was spent counseling and coordinating care  related to the above assessment and plan.  Signed by: Asencion Gowda, NP   Please contact Palliative Medicine Team phone at [phone] for questions and concerns.  For individual provider: See Shea Evans

## 2017-11-07 NOTE — Progress Notes (Signed)
New referral for outpatient Palliative to follow at [facility] received from [facility] CSW. Plan is for discharge today. Patient information faxed to referral. Flo Shanks RN, BSN, [place] [place], [facility] [phone]

## 2017-11-07 NOTE — Progress Notes (Addendum)
Nutrition Follow-up  DOCUMENTATION CODES:   Obesity unspecified  INTERVENTION:  Renal and carbohydrate restrictions were liberalized from diet today to try to increase PO intake. Patient now on just dysphagia 2 diet with thin liquids per SLP recommendations.  Increased Nepro to TID between meals. Each supplement provides 425 kcal and 19 grams protein.  Provide Magic cup TID with meals, each supplement provides 290 kcal and 9 grams of protein.  Continue Rena-vite QHS.  NUTRITION DIAGNOSIS:   Inadequate oral intake related to decreased appetite as evidenced by meal completion < 50%, per patient/family report.  Ongoing inadequate intake.  GOAL:   Patient will meet greater than or equal to 90% of their needs  Not progressing.  MONITOR:   PO intake, Supplement acceptance, Labs, Weight trends, Skin, I & O's  REASON FOR ASSESSMENT:   Malnutrition Screening Tool    ASSESSMENT:   Joseph Newman  is a 72 y.o. male with a known history of multiple medical problems including ESRD on hemodialysis, hypertension, diabetes, hyperlipidemia, BPH, sleep apnea, gout, arthritis, etc. the patient is sent to ED from dialysis center due to altered mental status.  The patient does not know why he is sent here.  He denies any symptoms except the cough.  According to his wife, the patient is living in [place] home.  He has had cough for 4 to 5 days.  He is on hemodialysis today but was found confusion by the nurse and sent to ED.  CAT scan of the head is unremarkable.  Chest x-ray show pulmonary fibrosis or interstitial edema.  ED physician request admission.  He was admitted for CVA work-up last month but no CVA.   -Patient with abnormal CT chest with increased hilar adenopathy cannot rule out infectious vs inflammatory vs malignant etiologies. -Patient will likely not be a candidate for PD. -Following SLP evaluation on 10/15 patient was recommended for dysphagia 2 diet with thin  liquids.  Unable to meet with patient as patient and family were meeting with PMT NP. Per RN patient is having a very poor PO intake. He had bites of ice cream and sips of Nepro this AM with medications.  Medications reviewed and include: allopurinol, vitamin D 2000 units daily, Novolog 0-9 units TID, Novolog 0-5 units QHS, Solu-Medrol 40 mg daily IV, Rena-vite QHS, vitamin B12 1000 micrograms daily, cefepime, vancomycin.  Labs reviewed: CBG 150-291. On 10/14 Potassium 3.4 and Phosphorus 2.7.  Discussed with Nephrology and okay to liberalize diet.  Diet Order:   Diet Order            DIET DYS 2 Room service appropriate? Yes; Fluid consistency: Thin; Fluid restriction: 1200 mL Fluid  Diet effective now              EDUCATION NEEDS:   Education needs have been addressed  Skin:  Skin Assessment: Reviewed RN Assessment  Last BM:  11/06/2017 - small type 6  Height:   Ht Readings from Last 1 Encounters:  11/01/17 6\' 5"  (1.956 m)    Weight:   Wt Readings from Last 1 Encounters:  11/05/17 92 kg    Ideal Body Weight:  94.5 kg  BMI:  Body mass index is 24.05 kg/m.  Estimated Nutritional Needs:   Kcal:  2100-2300  Protein:  105-120 grams  Fluid:  1000 ml + UOP  Willey Blade, MS, RD, LDN Office: [phone] Pager: [phone] After Hours/Weekend Pager: [phone]

## 2017-11-07 NOTE — Progress Notes (Signed)
Inpatient Diabetes Program Recommendations  AACE/ADA: New Consensus Statement on Inpatient Glycemic Control (2019)  Target Ranges:  Prepandial:   less than 140 mg/dL      Peak postprandial:   less than 180 mg/dL (1-2 hours)      Critically ill patients:  140 - 180 mg/dL   Results for Joseph Newman, Joseph Newman (MRN [id number]) as of 11/07/2017 14:37  Ref. Range 11/06/2017 16:30 11/06/2017 20:53 11/07/2017 08:01 11/07/2017 12:03  Glucose-Capillary Latest Ref Range: 70 - 99 mg/dL 194 (H) 222 (H) 224 (H) 291 (H)   Review of Glycemic Control  Diabetes history: DM2 Outpatient Diabetes medications: Trulicity 1.5 mg Qweek Current orders for Inpatient glycemic control: Novolog 0-9 units TID with meals, Novolog 0-5 units QHS; Solumedrol 40 mg daily  Inpatient Diabetes Program Recommendations: Insulin - Basal: If steroids are continued as ordered, please consider ordering low dose basal insulin. Recommend starting with Levemir 5 units Q24H. Please note that if Levemir is ordered as recommended, it will likely need to be adjusted as steroids are tapered.   Thanks, Barnie Alderman, RN, MSN, CDE Diabetes Coordinator Inpatient Diabetes Program [phone] (Team Pager from 8am to 5pm)

## 2017-11-07 NOTE — Progress Notes (Signed)
[facility]  ROUNDING NOTE   Subjective:   Appetite remains poor.    Chest CT shows lymphadenopathy. Concern about sarcoidosis No Acute c/o   Objective:  Vital signs in last 24 hours:  Temp:  [97.4 F (36.3 C)-98.2 F (36.8 C)] 98.2 F (36.8 C) (10/16 0434) Pulse Rate:  [76-83] 76 (10/16 0850) Resp:  [17-20] 17 (10/16 0434) BP: (117-144)/(54-85) 117/55 (10/16 0850) SpO2:  [95 %-98 %] 95 % (10/16 0434)  Weight change:  Filed Weights   11/03/17 1516 11/05/17 1008 11/05/17 1358  Weight: 94.1 kg 94 kg 92 kg    Intake/Output: I/O last 3 completed shifts: In: 100.4 [I.V.:0.5; IV Piggyback:99.9] Out: -    Intake/Output this shift:  No intake/output data recorded.     Physical Exam: General: NAD, laying in bed  Head:  Moist oral mucosal membranes  Eyes: Anicteric,   Neck: Supple,   Lungs:  B/l coarse breath sounds, no crackles  Heart: Regular rate and rhythm  Abdomen:  Soft, nontender,   Extremities: no peripheral edema.  Neurologic: answers few questions  Skin: No lesions  Access: RIJ permcath, PD catheter    Basic Metabolic Panel: Recent Labs  Lab 11/01/17 1542 11/02/17 0546 11/03/17 0525 11/05/17 1056  NA 139 138 137 142  K 3.0* 2.9* 2.9* 3.4*  CL 100 101 100 99  CO2 30 28 27  32  GLUCOSE 149* 191* 190* 176*  BUN 15 21 28* 30*  CREATININE 1.85* 2.58* 3.48* 4.60*  CALCIUM 8.4* 8.1* 8.4* 8.8*  PHOS  --   --   --  2.7    Liver Function Tests: Recent Labs  Lab 11/01/17 1542 11/05/17 1056  AST 14*  --   ALT 10  --   ALKPHOS 71  --   BILITOT 0.7  --   PROT 6.8  --   ALBUMIN 2.5* 2.2*   No results for input(s): LIPASE, AMYLASE in the last 168 hours. No results for input(s): AMMONIA in the last 168 hours.  CBC: Recent Labs  Lab 11/01/17 1542 11/02/17 0546 11/07/17 0524  WBC 10.1 8.5 7.1  HGB 10.1* 9.4* 10.0*  HCT 30.2* 28.4* 31.2*  MCV 83.0 82.6 83.9  PLT 223 205 289    Cardiac Enzymes: Recent Labs  Lab 11/01/17 1542   TROPONINI 0.06*    BNP: Invalid input(s): POCBNP  CBG: Recent Labs  Lab 11/06/17 1207 11/06/17 1630 11/06/17 2053 11/07/17 0801 11/07/17 1203  GLUCAP 237* 194* 35* 47* 291*    Microbiology: Results for orders placed or performed during the hospital encounter of 11/01/17  MRSA PCR Screening     Status: Abnormal   Collection Time: 11/02/17  3:06 AM  Result Value Ref Range Status   MRSA by PCR POSITIVE (A) NEGATIVE Final    Comment:        The GeneXpert MRSA Assay (FDA approved for NASAL specimens only), is one component of a comprehensive MRSA colonization surveillance program. It is not intended to diagnose MRSA infection nor to guide or monitor treatment for MRSA infections. RESULT CALLED TO, READ BACK BY AND VERIFIED WITH: C/SARA [facility] @0510  11/02/17 [facility] Performed at [facility], [address] [place], [place] [address]     Coagulation Studies: No results for input(s): LABPROT, INR in the last 72 hours.  Urinalysis: No results for input(s): COLORURINE, LABSPEC, PHURINE, GLUCOSEU, HGBUR, BILIRUBINUR, KETONESUR, PROTEINUR, UROBILINOGEN, NITRITE, LEUKOCYTESUR in the last 72 hours.  Invalid input(s): APPERANCEUR    Imaging: No results found.  Medications:   . sodium chloride Stopped (11/06/17 1750)  . ceFEPime (MAXIPIME) IV Stopped (11/06/17 1820)  . [START ON 11/08/2017] vancomycin     . allopurinol  50 mg Oral QODAY  . amLODipine  5 mg Oral Daily   And  . irbesartan  300 mg Oral Daily  . aspirin EC  81 mg Oral Daily  . calcitRIOL  0.25 mcg Oral Daily  . carvedilol  6.25 mg Oral BID WC  . cholecalciferol  2,000 Units Oral Daily  . citalopram  10 mg Oral Daily  . feeding supplement (NEPRO CARB STEADY)  237 mL Oral BID BM  . fluticasone  2 spray Each Nare Daily  . heparin  5,000 Units Subcutaneous Q8H  . insulin aspart  0-5 Units Subcutaneous QHS  . insulin aspart  0-9 Units Subcutaneous TID WC  . mouth rinse  15 mL Mouth Rinse  BID  . methylPREDNISolone (SOLU-MEDROL) injection  40 mg Intravenous Daily  . multivitamin  1 tablet Oral QHS  . sodium chloride flush  3 mL Intravenous Q12H  . tamsulosin  0.4 mg Oral QPC breakfast  . vitamin B-12  1,000 mcg Oral Daily   sodium chloride, acetaminophen **OR** acetaminophen, albuterol, guaiFENesin-dextromethorphan, hydrALAZINE, HYDROcodone-acetaminophen, ondansetron **OR** ondansetron (ZOFRAN) IV, senna-docusate, sodium chloride flush  Assessment/ Plan:  [name] is a 72 y.o. black male with end stage renal disease on dialysis, COPD, arthritis, depression, sleep apnea, diabetes mellitus type 2 with neuropathy, hypertension,  and gout  Currently on back up hemodialysis [facility] permcath  Peritoneal dialysis prescription 4 x 3000 mL, last fill 2000 mL  1.  End-stage renal disease:  TTS -Tolerated hemodialysis in chair on Monday.  2500 cc removed -Next hemodialysis planned for Thursday - Leaving the PD cathter in for now until further discussion with Family  2.  Altered mental status: - improving slowly - may have underlying dementia   3. Anemia of chronic kidney disease:   - EPO with HD treatment.   4. Secondary Hyperparathyroidism: outpatient PTH 156, phosphorus and calcium at goal - not currently on binders.   6. Gout: not in acute flare.  - allopurinol   7. MRSA screen + 11/02/17  8. Sarcoidosis Started on Prednisone. Will follow up with pulmonology Dr. Bea Laura    LOS: [place] 10/16/201912:18 PM

## 2017-11-07 NOTE — Discharge Summary (Signed)
[facility] NAME: Taiden Raybourn    MR#:  [id number]  DATE OF BIRTH:  [birth year]  DATE OF ADMISSION:  11/01/2017 ADMITTING PHYSICIAN: Demetrios Loll, MD  DATE OF DISCHARGE: 11/07/2017  PRIMARY CARE PHYSICIAN: Steele Sizer, MD    ADMISSION DIAGNOSIS:  Weakness [R53.1] Hypoxia [R09.02] Altered mental status, unspecified altered mental status type [R41.82] Hypervolemia, unspecified hypervolemia type [E87.70]  DISCHARGE DIAGNOSIS:  Active Problems:   Acute metabolic encephalopathy   Shortness of breath   SECONDARY DIAGNOSIS:   Past Medical History:  Diagnosis Date  . Arthritis   . Chronic airway obstruction, not elsewhere classified   . 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 Holley Raring, nephrologist  . Depressive disorder, not elsewhere classified   . Heart murmur   . Hyperlipidemia   . Hypertension   . Hypertrophy of prostate without urinary obstruction and other lower urinary tract symptoms (LUTS)   . Impotence of organic origin   . Microalbuminuria   . 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 Obesity, unspecified   . Plantar wart   . Sleep apnea   . Synovitis and tenosynovitis, unspecified   . Type II or unspecified type diabetes mellitus without mention of complication, uncontrolled   . Unspecified venous (peripheral) insufficiency     HOSPITAL COURSE:   1.  Acute hypoxic respiratory failure.  With abnormal CT scan of the chest with hilar adenopathy and could not rule out infectious versus inflammatory versus malignant etiologies.  I had pulmonary see the patient and they think it sarcoidosis I will give steroids.  With a slow taper.  If mental status worsens can stop the steroids.  Follow-up with Dr. Nanine Means pulmonary as outpatient.  I got more aggressive with antibiotics with vancomycin and cefepime and I will give the Levaquin upon getting out of the hospital.  Continue oxygen 2  L nasal cannula continuous. 2.  Acute encephalopathy.  I think the patient likely has underlying dementia.  Mental status improved after aggressive antibiotics were started.  Continue treatment for pneumonia as outpatient. 3.  End-stage renal disease on hemodialysis Tuesday Thursday and Saturday. 4.  Sarcoidosis.  Start steroids and continue as long as mental status is okay.  Follow-up with pulmonary as outpatient. 5.  Weakness.  Physical therapy recommends rehab. 6.  Hypokalemia dialysis to manage with high K bath. 7.  Hypertension on Norvasc Avapro and Coreg 8.  Hyperlipidemia hold atorvastatin since this could cause acute encephalopathy and weakness. 9.  Depression on Celexa 10.  History of gout on allopurinol 11.  Palliative care consultation appreciated.  Follow-up with palliative care at facility.  I do think the patient will decline over time.  DNR.  If patient declines can consider hospice. 12.  Poor appetite with dysphagia 2 diet with thin liquids.  I would not be overly concerned about the fluid restriction because I do not think he is going to eat too much. 13.  Impaired fasting glucose.  Levemir and short acting insulin prescribed since the patient is on steroids.  Can discontinue the Levemir if steroids are discontinued. 14. please do physical therapy prior to dialysis if possible.  Patient may not be up to doing physical therapy after dialysis.  DISCHARGE CONDITIONS:   Fair  CONSULTS OBTAINED:  Treatment Team:  Lavonia Dana, MD Allyne Gee, MD  DRUG ALLERGIES:  No Known Allergies  DISCHARGE MEDICATIONS:   Allergies as of 11/07/2017  No Known Allergies     Medication List    STOP taking these medications   atorvastatin 40 MG tablet Commonly known as:  LIPITOR   Olmesartan-amLODIPine-HCTZ 40-10-25 MG Tabs   sodium bicarbonate 263 MG tablet   TRULICITY 1.5 ZC/5.8IF Sopn Generic drug:  Dulaglutide     TAKE these medications   acetaminophen 325 MG  tablet Commonly known as:  TYLENOL Take 2 tablets (650 mg total) by mouth every 6 (six) hours as needed for mild pain (or Fever >/= 101).   albuterol (2.5 MG/3ML) 0.083% nebulizer solution Commonly known as:  PROVENTIL Take 3 mLs (2.5 mg total) by nebulization every 6 (six) hours.   allopurinol 100 MG tablet Commonly known as:  ZYLOPRIM Take 0.5 tablets (50 mg total) by mouth every other day.   amLODipine 5 MG tablet Commonly known as:  NORVASC Take 1 tablet (5 mg total) by mouth daily. Start taking on:  11/08/2017   aspirin EC 81 MG tablet Take 81 mg by mouth daily.   calcitRIOL 0.25 MCG capsule Commonly known as:  ROCALTROL Take 0.25 mcg by mouth daily.   carvedilol 6.25 MG tablet Commonly known as:  COREG Take 6.25 mg by mouth 2 (two) times daily with a meal.   citalopram 10 MG tablet Commonly known as:  CELEXA Take 10 mg by mouth daily.   feeding supplement (NEPRO CARB STEADY) Liqd Take 237 mLs by mouth 3 (three) times daily between meals.   fluticasone 50 MCG/ACT nasal spray Commonly known as:  FLONASE Place 2 sprays into both nostrils daily. Start taking on:  11/08/2017   guaiFENesin-dextromethorphan 100-10 MG/5ML syrup Commonly known as:  ROBITUSSIN DM Take 5 mLs by mouth every 4 (four) hours as needed for cough.   insulin aspart 100 UNIT/ML injection Commonly known as:  novoLOG 3 units with meals if eating   irbesartan 300 MG tablet Commonly known as:  AVAPRO Take 1 tablet (300 mg total) by mouth daily. Start taking on:  11/08/2017   levofloxacin 500 MG tablet Commonly known as:  LEVAQUIN every 48 hours after dailysis Start taking on:  11/08/2017   multivitamin Tabs tablet Take 1 tablet by mouth at bedtime.   ONE TOUCH ULTRA SYSTEM KIT w/Device Kit 1 kit by Does not apply route once.   oxyCODONE-acetaminophen 5-325 MG tablet Commonly known as:  PERCOCET/ROXICET Take 1 tablet by mouth every 8 (eight) hours as needed for moderate pain or severe  pain.   predniSONE 10 MG tablet Commonly known as:  DELTASONE 4 tabs p.o. daily for 3 days, 3-1/2 tab daily for 3 days, 3 tablets daily for 3 days, 2-1/2 tablets for 3 days, 2 tablets daily for 3 days and continued until seeing pulmonology. What changed:  additional instructions   tamsulosin 0.4 MG Caps capsule Commonly known as:  FLOMAX Take 0.4 mg by mouth daily after breakfast.   Vitamin B-12 1000 MCG Subl Take 1 tablet by mouth daily.   Vitamin D 2000 units Caps Take 1 capsule by mouth daily.        DISCHARGE INSTRUCTIONS:   Follow-up with Dr. at rehab 1 day Dialysis Tuesday Thursday Saturday Pulmonology 1 week  If you experience worsening of your admission symptoms, develop shortness of breath, life threatening emergency, suicidal or homicidal thoughts you must seek medical attention immediately by calling 911 or calling your MD immediately  if symptoms less severe.  You Must read complete instructions/literature along with all the possible adverse reactions/side effects for all the  Medicines you take and that have been prescribed to you. Take any new Medicines after you have completely understood and accept all the possible adverse reactions/side effects.   Please note  You were cared for by a hospitalist during your hospital stay. If you have any questions about your discharge medications or the care you received while you were in the hospital after you are discharged, you can call the unit and asked to speak with the hospitalist on call if the hospitalist that took care of you is not available. Once you are discharged, your primary care physician will handle any further medical issues. Please note that NO REFILLS for any discharge medications will be authorized once you are discharged, as it is imperative that you return to your primary care physician (or establish a relationship with a primary care physician if you do not have one) for your aftercare needs so that they can  reassess your need for medications and monitor your lab values.    Today   CHIEF COMPLAINT:   Chief Complaint  Patient presents with  . Altered Mental Status    HISTORY OF PRESENT ILLNESS:  Tayton Decaire  is a 72 y.o. male sent in for altered mental status   VITAL SIGNS:  Blood pressure (!) 117/55, pulse 76, temperature 98.2 F (36.8 C), temperature source Oral, resp. rate 17, height _0  (1.956 m), weight 92 kg, SpO2 95 %.  I/O:    Intake/Output Summary (Last 24 hours) at 11/07/2017 1453 Last data filed at 11/07/2017 0300 Gross per 24 hour  Intake 100.4 ml  Output -  Net 100.4 ml    PHYSICAL EXAMINATION:  GENERAL:  72 y.o.-year-old patient lying in the bed with no acute distress.  EYES: Pupils equal, round, reactive to light and accommodation. No scleral icterus.   HEENT: Head atraumatic, normocephalic. Oropharynx and nasopharynx clear.  NECK:  Supple, no jugular venous distention. No thyroid enlargement, no tenderness.  LUNGS: Decreased breath sounds bilaterally, no wheezing, rales,rhonchi or crepitation. No use of accessory muscles of respiration.  CARDIOVASCULAR: S1, S2 normal. No murmurs, rubs, or gallops.  ABDOMEN: Soft, non-tender, non-distended. Bowel sounds present. No organomegaly or mass.  EXTREMITIES: Trace pedal edema, no cyanosis, or clubbing.  NEUROLOGIC: Cranial nerves II through XII are intact. Muscle strength 5/5 in all extremities. Sensation intact. Gait not checked.  PSYCHIATRIC: The patient is alert and answers questions.  SKIN: No obvious rash, lesion, or ulcer.   DATA REVIEW:   CBC Recent Labs  Lab 11/07/17 0524  WBC 7.1  HGB 10.0*  HCT 31.2*  PLT 289    Chemistries  Recent Labs  Lab 11/01/17 1542  11/05/17 1056  NA 139   < > 142  K 3.0*   < > 3.4*  CL 100   < > 99  CO2 30   < > 32  GLUCOSE 149*   < > 176*  BUN 15   < > 30*  CREATININE 1.85*   < > 4.60*  CALCIUM 8.4*   < > 8.8*  AST 14*  --   --   ALT 10  --   --   ALKPHOS  71  --   --   BILITOT 0.7  --   --    < > = values in this interval not displayed.    Cardiac Enzymes Recent Labs  Lab 11/01/17 1542  TROPONINI 0.06*    Microbiology Results  Results for orders placed or performed during the hospital encounter  of 11/01/17  MRSA PCR Screening     Status: Abnormal   Collection Time: 11/02/17  3:06 AM  Result Value Ref Range Status   MRSA by PCR POSITIVE (A) NEGATIVE Final    Comment:        The GeneXpert MRSA Assay (FDA approved for NASAL specimens only), is one component of a comprehensive MRSA colonization surveillance program. It is not intended to diagnose MRSA infection nor to guide or monitor treatment for MRSA infections. RESULT CALLED TO, READ BACK BY AND VERIFIED WITH: C/SARA [facility] _0  11/02/17 [facility] Performed at [facility], [address] [place], [place] [address]      Management plans discussed with the patient, family and they are in agreement.  CODE STATUS:     Code Status Orders  (From admission, onward)         Start     Ordered   11/01/17 1836  Do not attempt resuscitation (DNR)  Continuous    Question Answer Comment  In the event of cardiac or respiratory ARREST Do not call a "code blue"   In the event of cardiac or respiratory ARREST Do not perform Intubation, CPR, defibrillation or ACLS   In the event of cardiac or respiratory ARREST Use medication by any route, position, wound care, and other measures to relive pain and suffering. May use oxygen, suction and manual treatment of airway obstruction as needed for comfort.      11/01/17 1835        Code Status History    Date Active Date Inactive Code Status Order ID Comments User Context   09/25/2017 1616 09/29/2017 2043 Full Code [id number]  Epifanio Lesches, MD ED    Advance Directive Documentation     Most Recent Value  Type of Advance Directive  Healthcare Power of Attorney, Living will  Pre-existing out of facility DNR order (yellow  form or pink MOST form)  -  "MOST" Form in Place?  -      TOTAL TIME TAKING CARE OF THIS PATIENT: 45 minutes.    Loletha Grayer M.D on 11/07/2017 at 2:53 PM  Between 7am to 6pm - Pager - [phone]  After 6pm go to [email or web address] - password [email or web address]  [facility] Office  [phone]  CC: Primary care physician; Steele Sizer, MD

## 2017-11-07 NOTE — Care Management Important Message (Signed)
Important Message  Patient Details  Name: Joseph Newman MRN: [id number] Date of Birth: September 09, 1945   Medicare Important Message Given:  Yes. Signed by wife.    Shelbie Ammons, RN 11/07/2017, 11:31 AM

## 2017-11-07 NOTE — Clinical Social Work Note (Signed)
Patient is medically ready for discharge today. CSW notified patient's wife Joseph Newman [phone]. [name] asked that patient be transported to [facility]. CSW explained that insurance has already authorized [facility] (per wife's choice on 11/05/17) Wife asked for disposition to change to [facility]. CSW attempted to change disposition but [name] is unable to accommodate dialysis schedule and location. CSW notified patient's wife of above and wife agreed to let patient go to [facility]. CSW notified [name], admissions coordinator of discharge today. Patient will be transported by EMS. RN to call report and call for transport.   [name], [name]

## 2017-11-19 ENCOUNTER — Ambulatory Visit: Payer: Medicare Other | Admitting: Internal Medicine

## 2017-11-19 ENCOUNTER — Encounter: Payer: Self-pay | Admitting: Internal Medicine

## 2017-11-19 VITALS — BP 114/76 | HR 77 | Resp 16 | Ht 77.0 in | Wt 196.5 lb

## 2017-11-19 DIAGNOSIS — D869 Sarcoidosis, unspecified: Secondary | ICD-10-CM

## 2017-11-19 DIAGNOSIS — J449 Chronic obstructive pulmonary disease, unspecified: Secondary | ICD-10-CM | POA: Diagnosis not present

## 2017-11-19 DIAGNOSIS — R0602 Shortness of breath: Secondary | ICD-10-CM

## 2017-11-19 DIAGNOSIS — N186 End stage renal disease: Secondary | ICD-10-CM | POA: Diagnosis not present

## 2017-11-19 DIAGNOSIS — Z992 Dependence on renal dialysis: Secondary | ICD-10-CM

## 2017-11-19 DIAGNOSIS — R590 Localized enlarged lymph nodes: Secondary | ICD-10-CM | POA: Diagnosis not present

## 2017-11-19 NOTE — Progress Notes (Signed)
[facility] [place], [place] [address]  Pulmonary Sleep Medicine   Office Visit Note  Patient Name: Joseph Newman DOB: February 08, 1945 MRN [id number]  Date of Service: 11/19/2017  Complaints/HPI: Patient is here after admission to the hospital.  He had been admitted with extensive lymphadenopathy noted on his CT scan.  Patient apparently had a similar appearance of lymphadenopathy and a previous scan that was done.  I think that this would be consistent with something like that.  We offered the possibility of doing a biopsy with bronchoscopy and at this time patient wants to hold off.  I would recommend that he continue with steroids which he was started on as an inpatient and we can then start to wean those steroids slowly.  In addition he is 29 evaluation with pulmonary function studies.  ROS  General: (-) fever, (-) chills, (-) night sweats, (-) weakness Skin: (-) rashes, (-) itching,. Eyes: (-) visual changes, (-) redness, (-) itching. Nose and Sinuses: (-) nasal stuffiness or itchiness, (-) postnasal drip, (-) nosebleeds, (-) sinus trouble. Mouth and Throat: (-) sore throat, (-) hoarseness. Neck: (-) swollen glands, (-) enlarged thyroid, (-) neck pain. Respiratory: + cough, (-) bloody sputum, + shortness of breath, - wheezing. Cardiovascular: - ankle swelling, (-) chest pain. Lymphatic: (-) lymph node enlargement. Neurologic: (-) numbness, (-) tingling. Psychiatric: (-) anxiety, (-) depression   Current Medication: Outpatient Encounter Medications as of 11/19/2017  Medication Sig  . acetaminophen (TYLENOL) 325 MG tablet Take 2 tablets (650 mg total) by mouth every 6 (six) hours as needed for mild pain (or Fever >/= 101).  Marland Kitchen albuterol (PROVENTIL) (2.5 MG/3ML) 0.083% nebulizer solution Take 3 mLs (2.5 mg total) by nebulization every 6 (six) hours.  Marland Kitchen allopurinol (ZYLOPRIM) 100 MG tablet Take 0.5 tablets (50 mg total) by mouth every other day.  Marland Kitchen amLODipine  (NORVASC) 5 MG tablet Take 1 tablet (5 mg total) by mouth daily.  Marland Kitchen aspirin EC 81 MG tablet Take 81 mg by mouth daily.  . Blood Glucose Monitoring Suppl (ONE TOUCH ULTRA SYSTEM KIT) W/DEVICE KIT 1 kit by Does not apply route once.  . calcitRIOL (ROCALTROL) 0.25 MCG capsule Take 0.25 mcg by mouth daily.   . carvedilol (COREG) 6.25 MG tablet Take 6.25 mg by mouth 2 (two) times daily with a meal.   . Cholecalciferol (VITAMIN D) 2000 UNITS CAPS Take 1 capsule by mouth daily.   . citalopram (CELEXA) 10 MG tablet Take 10 mg by mouth daily.  . Cyanocobalamin (VITAMIN B-12) 1000 MCG SUBL Take 1 tablet by mouth daily.  . fluticasone (FLONASE) 50 MCG/ACT nasal spray Place 2 sprays into both nostrils daily.  . insulin aspart (NOVOLOG) 100 UNIT/ML injection 3 units with meals if eating  . insulin detemir (LEVEMIR) 100 UNIT/ML injection Inject 0.05 mLs (5 Units total) into the skin at bedtime.  . irbesartan (AVAPRO) 300 MG tablet Take 1 tablet (300 mg total) by mouth daily.  . multivitamin (RENA-VIT) TABS tablet Take 1 tablet by mouth at bedtime.  . Nutritional Supplements (FEEDING SUPPLEMENT, NEPRO CARB STEADY,) LIQD Take 237 mLs by mouth 3 (three) times daily between meals.  . OXYGEN Inhale into the lungs. 2. liters  . predniSONE (DELTASONE) 10 MG tablet 4 tabs p.o. daily for 3 days, 3-1/2 tab daily for 3 days, 3 tablets daily for 3 days, 2-1/2 tablets for 3 days, 2 tablets daily for 3 days and continued until seeing pulmonology.  . tamsulosin (FLOMAX) 0.4 MG CAPS capsule  Take 0.4 mg by mouth daily after breakfast.   . oxyCODONE-acetaminophen (PERCOCET/ROXICET) 5-325 MG tablet Take 1 tablet by mouth every 8 (eight) hours as needed for moderate pain or severe pain. (Patient not taking: Reported on 11/19/2017)  . [DISCONTINUED] guaiFENesin-dextromethorphan (ROBITUSSIN DM) 100-10 MG/5ML syrup Take 5 mLs by mouth every 4 (four) hours as needed for cough. (Patient not taking: Reported on 11/19/2017)  .  [DISCONTINUED] levofloxacin (LEVAQUIN) 500 MG tablet every 48 hours after dailysis (Patient not taking: Reported on 11/19/2017)   No facility-administered encounter medications on file as of 11/19/2017.     Surgical History: Past Surgical History:  Procedure Laterality Date  . DIALYSIS/PERMA CATHETER INSERTION N/A 09/28/2017   Procedure: DIALYSIS/PERMA CATHETER INSERTION;  Surgeon: Katha Cabal, MD;  Location: [facility];  Service: Cardiovascular;  Laterality: N/A;  . foot surgery      Medical History: Past Medical History:  Diagnosis Date  . Arthritis   . Chronic airway obstruction, not elsewhere classified   . 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 Holley Raring, nephrologist  . Depressive disorder, not elsewhere classified   . Heart murmur   . Hyperlipidemia   . Hypertension   . Hypertrophy of prostate without urinary obstruction and other lower urinary tract symptoms (LUTS)   . Impotence of organic origin   . Microalbuminuria   . 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 Obesity, unspecified   . Plantar wart   . Sleep apnea   . Synovitis and tenosynovitis, unspecified   . Type II or unspecified type diabetes mellitus without mention of complication, uncontrolled   . Unspecified venous (peripheral) insufficiency     Family History: Family History  Problem Relation Age of Onset  . Hypertension Mother   . Kidney disease Neg Hx   . Prostate cancer Neg Hx     Social History: Social History   Socioeconomic History  . Marital status: Married    Spouse name: Not on file  . Number of children: 2  . Years of education: Not on file  . Highest education level: Not on file  Occupational History  . Not on file  Social Needs  . Financial resource strain: Patient refused  . Food insecurity:    Worry: Patient refused    Inability: Patient refused  . Transportation needs:    Medical: Patient refused    Non-medical: Patient  refused  Tobacco Use  . Smoking status: Former Smoker    Packs/day: 0.25    Years: 30.00    Pack years: 7.50    Types: Cigarettes, Cigars    Last attempt to quit: 07/23/2017    Years since quitting: 0.3  . Smokeless tobacco: Never Used  . Tobacco comment: he is cutting down, used to smoke one pack day   Substance and Sexual Activity  . Alcohol use: Yes    Alcohol/week: 0.0 standard drinks    Comment: occasional  . Drug use: No  . Sexual activity: Not Currently  Lifestyle  . Physical activity:    Days per week: Patient refused    Minutes per session: Patient refused  . Stress: Patient refused  Relationships  . Social connections:    Talks on phone: Patient refused    Gets together: Patient refused    Attends religious service: Patient refused    Active member of club or organization: Patient refused    Attends meetings of clubs or organizations: Patient refused    Relationship  status: Patient refused  . Intimate partner violence:    Fear of current or ex partner: Patient refused    Emotionally abused: Patient refused    Physically abused: Patient refused    Forced sexual activity: Patient refused  Other Topics Concern  . Not on file  Social History Narrative   Lives with wife    Vital Signs: Blood pressure 114/76, pulse 77, resp. rate 16, height 6' 5"  (1.956 m), weight 196 lb 8 oz (89.1 kg), SpO2 96 %.  Examination: General Appearance: The patient is well-developed, well-nourished, and in no distress. Skin: Gross inspection of skin unremarkable. Head: normocephalic, no gross deformities. Eyes: no gross deformities noted. ENT: ears appear grossly normal no exudates. Neck: Supple. No thyromegaly. No LAD. Respiratory: few rhonchi. Cardiovascular: Normal S1 and S2 without murmur or rub. Extremities: No cyanosis. pulses are equal. Neurologic: Alert and oriented. No involuntary movements.  LABS: Recent Results (from the past 2160 hour(s))  Glucose, capillary      Status: Abnormal   Collection Time: 09/25/17  1:30 PM  Result Value Ref Range   Glucose-Capillary 179 (H) 70 - 99 mg/dL  Ethanol     Status: None   Collection Time: 09/25/17  1:37 PM  Result Value Ref Range   Alcohol, Ethyl (B) <10 <10 mg/dL    Comment: (NOTE) Lowest detectable limit for serum alcohol is 10 mg/dL. For medical purposes only. Performed at [facility], [address] [place], [place] [address]   Protime-INR     Status: None   Collection Time: 09/25/17  1:37 PM  Result Value Ref Range   Prothrombin Time 14.3 11.4 - 15.2 seconds   INR 1.12     Comment: Performed at [facility], [address] [place], [place] [address]  APTT     Status: None   Collection Time: 09/25/17  1:37 PM  Result Value Ref Range   aPTT 36 24 - 36 seconds    Comment: Performed at [facility], [address] [place], [place] [address]  CBC     Status: Abnormal   Collection Time: 09/25/17  1:37 PM  Result Value Ref Range   WBC 15.8 (H) 3.8 - 10.6 K/uL   RBC 3.64 (L) 4.40 - 5.90 MIL/uL   Hemoglobin 10.6 (L) 13.0 - 18.0 g/dL   HCT 31.3 (L) 40.0 - 52.0 %   MCV 85.9 80.0 - 100.0 fL   MCH 29.0 26.0 - 34.0 pg   MCHC 33.8 32.0 - 36.0 g/dL   RDW 15.2 (H) 11.5 - 14.5 %   Platelets 226 150 - 440 K/uL    Comment: Performed at [facility], [address] [place], [place] [address]  Differential     Status: Abnormal   Collection Time: 09/25/17  1:37 PM  Result Value Ref Range   Neutrophils Relative % 81 %   Neutro Abs 12.8 (H) 1.4 - 6.5 K/uL   Lymphocytes Relative 9 %   Lymphs Abs 1.4 1.0 - 3.6 K/uL   Monocytes Relative 10 %   Monocytes Absolute 1.5 (H) 0.2 - 1.0 K/uL   Eosinophils Relative 0 %   Eosinophils Absolute 0.0 0 - 0.7 K/uL   Basophils Relative 0 %   Basophils Absolute 0.1 0 - 0.1 K/uL    Comment: Performed at [facility], [address], [place], [place] [address]  Comprehensive metabolic panel     Status: Abnormal    Collection Time: 09/25/17  1:37 PM  Result Value Ref Range   Sodium 136 135 - 145 mmol/L   Potassium 3.7 3.5 - 5.1 mmol/L   Chloride 96 (L) 98 - 111 mmol/L   CO2 27 22 - 32 mmol/L   Glucose, Bld 214 (H) 70 - 99 mg/dL   BUN 54 (H) 8 - 23 mg/dL   Creatinine, Ser 5.78 (H) 0.61 - 1.24 mg/dL   Calcium 8.5 (L) 8.9 - 10.3 mg/dL   Total Protein 7.0 6.5 - 8.1 g/dL   Albumin 2.9 (L) 3.5 - 5.0 g/dL   AST 16 15 - 41 U/L   ALT 9 0 - 44 U/L   Alkaline Phosphatase 74 38 - 126 U/L   Total Bilirubin 0.6 0.3 - 1.2 mg/dL   GFR calc non Af Amer 9 (L) >60 mL/min   GFR calc Af Amer 10 (L) >60 mL/min    Comment: (NOTE) The eGFR has been calculated using the CKD EPI equation. This calculation has not been validated in all clinical situations. eGFR's persistently <60 mL/min signify possible Chronic Kidney Disease.    Anion gap 13 5 - 15    Comment: Performed at [facility], [address] [place], [place] [address]  Troponin I     Status: Abnormal   Collection Time: 09/25/17  1:37 PM  Result Value Ref Range   Troponin I 0.03 (HH) <0.03 ng/mL    Comment: CRITICAL RESULT CALLED TO, READ BACK BY AND VERIFIED WITH CHRISTINA JAMES @1431  09/25/17 AKT Performed at [facility], [address] [place], [place] [address]   Blood culture (routine x 2)     Status: None   Collection Time: 09/25/17  1:37 PM  Result Value Ref Range   Specimen Description BLOOD RIGHT FA    Special Requests      BOTTLES DRAWN AEROBIC AND ANAEROBIC Blood Culture results may not be optimal due to an excessive volume of blood received in culture bottles   Culture      NO GROWTH 5 DAYS Performed at [facility], [address], [place], [place] [address]    Report Status 09/30/2017 FINAL   Lactic acid, plasma     Status: Abnormal   Collection Time: 09/25/17  1:37 PM  Result Value Ref Range   Lactic Acid, Venous 2.1 (HH) 0.5 - 1.9 mmol/L    Comment: CRITICAL RESULT CALLED TO, READ BACK BY AND  VERIFIED WITH BRANDI MILLER 09/25/17 1832 KLW Performed at [facility], [address] [place], [place] [address]   Blood culture (routine x 2)     Status: None   Collection Time: 09/25/17  2:51 PM  Result Value Ref Range   Specimen Description BLOOD RIGHT ANTECUBITAL    Special Requests      BOTTLES DRAWN AEROBIC AND ANAEROBIC Blood Culture results may not be optimal due to an excessive volume of blood received in culture bottles   Culture      NO GROWTH 5 DAYS Performed at [facility], [address] [place], [place] [address]    Report Status 10/01/2017 FINAL   Uric acid     Status: None   Collection Time: 09/25/17  6:42 PM  Result Value Ref Range   Uric Acid, Serum 6.4 3.7 - 8.6 mg/dL    Comment: Performed at [facility], [address] [place], [place] [address]  CK     Status: None   Collection Time: 09/25/17  6:42 PM  Result Value Ref Range  Total CK 49 49 - 397 U/L    Comment: Performed at [facility], [address] [place], [place] [address]  ECHOCARDIOGRAM COMPLETE     Status: None   Collection Time: 09/25/17  7:32 PM  Result Value Ref Range   Weight 3,721.36 oz   Height 77 in   BP 139/73 mmHg  Glucose, capillary     Status: Abnormal   Collection Time: 09/25/17  8:22 PM  Result Value Ref Range   Glucose-Capillary 183 (H) 70 - 99 mg/dL  Hemoglobin A1c     Status: Abnormal   Collection Time: 09/26/17  3:54 AM  Result Value Ref Range   Hgb A1c MFr Bld 7.2 (H) 4.8 - 5.6 %    Comment: (NOTE) Pre diabetes:          5.7%-6.4% Diabetes:              >6.4% Glycemic control for   <7.0% adults with diabetes    Mean Plasma Glucose 159.94 mg/dL    Comment: Performed at [facility], [place] [address] [place], [place] [address]  Lipid panel     Status: Abnormal   Collection Time: 09/26/17  3:54 AM  Result Value Ref Range   Cholesterol 66 0 - 200 mg/dL   Triglycerides 44 <150 mg/dL   HDL 29 (L) >40 mg/dL   Total  CHOL/HDL Ratio 2.3 RATIO   VLDL 9 0 - 40 mg/dL   LDL Cholesterol 28 0 - 99 mg/dL    Comment:        Total Cholesterol/HDL:CHD Risk Coronary Heart Disease Risk Table                     Men   Women  1/2 Average Risk   3.4   3.3  Average Risk       5.0   4.4  2 X Average Risk   9.6   7.1  3 X Average Risk  23.4   11.0        Use the calculated Patient Ratio above and the CHD Risk Table to determine the patient's CHD Risk.        ATP III CLASSIFICATION (LDL):  <100     mg/dL   Optimal  100-129  mg/dL   Near or Above                    Optimal  130-159  mg/dL   Borderline  160-189  mg/dL   High  >190     mg/dL   Very High Performed at [facility], [address] [place], [place] [address]   Glucose, capillary     Status: Abnormal   Collection Time: 09/26/17  7:44 AM  Result Value Ref Range   Glucose-Capillary 172 (H) 70 - 99 mg/dL  CBC     Status: Abnormal   Collection Time: 09/26/17  8:19 AM  Result Value Ref Range   WBC 18.6 (H) 3.8 - 10.6 K/uL   RBC 3.39 (L) 4.40 - 5.90 MIL/uL   Hemoglobin 10.1 (L) 13.0 - 18.0 g/dL   HCT 28.9 (L) 40.0 - 52.0 %   MCV 85.3 80.0 - 100.0 fL   MCH 29.8 26.0 - 34.0 pg   MCHC 35.0 32.0 - 36.0 g/dL   RDW 15.0 (H) 11.5 - 14.5 %   Platelets 205 150 - 440 K/uL    Comment: Performed at [facility], [address]  [place], [place] [address]  Basic metabolic panel     Status: Abnormal   Collection Time: 09/26/17  8:19 AM  Result Value Ref Range   Sodium 136 135 - 145 mmol/L   Potassium 3.9 3.5 - 5.1 mmol/L   Chloride 95 (L) 98 - 111 mmol/L   CO2 28 22 - 32 mmol/L   Glucose, Bld 184 (H) 70 - 99 mg/dL   BUN 63 (H) 8 - 23 mg/dL   Creatinine, Ser 6.08 (H) 0.61 - 1.24 mg/dL   Calcium 8.2 (L) 8.9 - 10.3 mg/dL   GFR calc non Af Amer 8 (L) >60 mL/min   GFR calc Af Amer 9 (L) >60 mL/min    Comment: (NOTE) The eGFR has been calculated using the CKD EPI equation. This calculation has not been validated in all clinical  situations. eGFR's persistently <60 mL/min signify possible Chronic Kidney Disease.    Anion gap 13 5 - 15    Comment: Performed at [facility], [address] [place], [place] [address]  Glucose, capillary     Status: Abnormal   Collection Time: 09/26/17 11:40 AM  Result Value Ref Range   Glucose-Capillary 215 (H) 70 - 99 mg/dL  Body fluid cell count with differential     Status: Abnormal   Collection Time: 09/26/17  4:54 PM  Result Value Ref Range   Fluid Type-FCT Peritoneal    Color, Fluid COLORLESS (A) YELLOW   Appearance, Fluid CLEAR CLEAR   WBC, Fluid 52 cu mm   Neutrophil Count, Fluid 4 %   Lymphs, Fluid 87 %   Monocyte-Macrophage-Serous Fluid 9 %   Eos, Fluid 0 %    Comment: Performed at [facility], [address] [place], [place] [address]  Body fluid culture     Status: None   Collection Time: 09/26/17  4:54 PM  Result Value Ref Range   Specimen Description      PERITONEAL Performed at [facility], [address], [place], [place] [address]    Special Requests      NONE Performed at [facility], [place], [place] [address]    Gram Stain      WBC PRESENT, PREDOMINANTLY MONONUCLEAR NO ORGANISMS SEEN CYTOSPIN SMEAR    Culture      NO GROWTH 3 DAYS Performed at [facility], [place] [address] [place], [place] [address]    Report Status 09/29/2017 FINAL   Pathologist smear review     Status: None   Collection Time: 09/26/17  4:54 PM  Result Value Ref Range   Path Review      Cytospin slide of peritoneal dialysate reviewed. Cell count is low, with mostly lymphocytes and macrophages. A rare neutrophil and mesothelial cell is noted. No malignant cells are seen.    Comment: Reviewed by Lemmie Evens. Dicie Beam, MD. Performed at [facility], [address] [place], [place] [address]   Glucose, capillary     Status: Abnormal   Collection Time: 09/26/17  5:17 PM  Result Value Ref Range    Glucose-Capillary 172 (H) 70 - 99 mg/dL  Uric acid     Status: None   Collection Time: 09/26/17  7:34 PM  Result Value Ref Range   Uric Acid, Serum 7.0 3.7 - 8.6 mg/dL    Comment: Performed at [facility], [address], [place], [place] [address]  Sedimentation rate     Status: Abnormal   Collection Time: 09/26/17  7:34 PM  Result Value Ref Range   Sed Rate 127 (H) 0 - 20 mm/hr    Comment: Performed at [facility], [address] [place], [place] [address]  C-reactive protein     Status: Abnormal   Collection Time: 09/26/17  7:34 PM  Result Value Ref Range   CRP 24.3 (H) <1.0 mg/dL    Comment: Performed at [facility], [place] [address], [place], [place] [address]  Glucose, capillary     Status: Abnormal   Collection Time: 09/26/17 10:09 PM  Result Value Ref Range   Glucose-Capillary 203 (H) 70 - 99 mg/dL   Comment 1 Notify RN   CBC     Status: Abnormal   Collection Time: 09/27/17  4:32 AM  Result Value Ref Range   WBC 16.2 (H) 3.8 - 10.6 K/uL   RBC 3.62 (L) 4.40 - 5.90 MIL/uL   Hemoglobin 10.6 (L) 13.0 - 18.0 g/dL   HCT 30.8 (L) 40.0 - 52.0 %   MCV 85.1 80.0 - 100.0 fL   MCH 29.4 26.0 - 34.0 pg   MCHC 34.6 32.0 - 36.0 g/dL   RDW 15.1 (H) 11.5 - 14.5 %   Platelets 218 150 - 440 K/uL    Comment: Performed at [facility], [address] [place], [place] [address]  Basic metabolic panel     Status: Abnormal   Collection Time: 09/27/17  4:32 AM  Result Value Ref Range   Sodium 133 (L) 135 - 145 mmol/L   Potassium 3.6 3.5 - 5.1 mmol/L   Chloride 93 (L) 98 - 111 mmol/L   CO2 26 22 - 32 mmol/L   Glucose, Bld 238 (H) 70 - 99 mg/dL   BUN 61 (H) 8 - 23 mg/dL   Creatinine, Ser 6.00 (H) 0.61 - 1.24 mg/dL   Calcium 8.2 (L) 8.9 - 10.3 mg/dL   GFR calc non Af Amer 8 (L) >60 mL/min   GFR calc Af Amer 10 (L) >60 mL/min    Comment: (NOTE) The eGFR has been calculated using the CKD EPI equation. This calculation has not been validated in all  clinical situations. eGFR's persistently <60 mL/min signify possible Chronic Kidney Disease.    Anion gap 14 5 - 15    Comment: Performed at [facility], [address] [place], [place] [address]  Hepatitis B surface antigen     Status: None   Collection Time: 09/27/17  4:32 AM  Result Value Ref Range   Hepatitis B Surface Ag Negative Negative    Comment: (NOTE) Performed At: [facility] [place], [place] [id number] Rush Farmer MD [id number]   Glucose, capillary     Status: Abnormal   Collection Time: 09/27/17  7:38 AM  Result Value Ref Range   Glucose-Capillary 250 (H) 70 - 99 mg/dL  Glucose, capillary     Status: Abnormal   Collection Time: 09/27/17 11:44 AM  Result Value Ref Range   Glucose-Capillary 212 (H) 70 - 99 mg/dL  Glucose, capillary     Status: Abnormal   Collection Time: 09/27/17  4:17 PM  Result Value Ref Range   Glucose-Capillary 176 (H) 70 - 99 mg/dL  Synovial cell count + diff, w/ crystals     Status: Abnormal   Collection Time: 09/27/17  6:12 PM  Result Value Ref Range   Color, Synovial YELLOW (A) YELLOW   Appearance-Synovial CLOUDY (A) CLEAR   Crystals, Fluid INTRACELLULAR MONOSODIUM URATE CRYSTALS  Comment: EXTRACELLULAR MONOSODIUM URATE CRYSTALS   WBC, Synovial 15,729 (H) 0 - 200 /cu mm   Neutrophil, Synovial 89 %   Lymphocytes-Synovial Fld 2 %   Monocyte-Macrophage-Synovial Fluid 9 %   Eosinophils-Synovial 0 %   Other Cells-SYN 0     Comment: Performed at [facility], [address] [place], [place] [address]  Urine Drug Screen, Qualitative ([place] only)     Status: None   Collection Time: 09/27/17  8:25 PM  Result Value Ref Range   Tricyclic, Ur Screen NONE DETECTED NONE DETECTED   Amphetamines, Ur Screen NONE DETECTED NONE DETECTED   MDMA (Ecstasy)Ur Screen NONE DETECTED NONE DETECTED   Cocaine Metabolite,Ur [place] NONE DETECTED NONE DETECTED   Opiate, Ur Screen NONE DETECTED NONE DETECTED    Phencyclidine (PCP) Ur S NONE DETECTED NONE DETECTED   Cannabinoid 50 Ng, Ur [place] NONE DETECTED NONE DETECTED   Barbiturates, Ur Screen NONE DETECTED NONE DETECTED   Benzodiazepine, Ur Scrn NONE DETECTED NONE DETECTED   Methadone Scn, Ur NONE DETECTED NONE DETECTED    Comment: (NOTE) Tricyclics + metabolites, urine    Cutoff 1000 ng/mL Amphetamines + metabolites, urine  Cutoff 1000 ng/mL MDMA (Ecstasy), urine              Cutoff 500 ng/mL Cocaine Metabolite, urine          Cutoff 300 ng/mL Opiate + metabolites, urine        Cutoff 300 ng/mL Phencyclidine (PCP), urine         Cutoff 25 ng/mL Cannabinoid, urine                 Cutoff 50 ng/mL Barbiturates + metabolites, urine  Cutoff 200 ng/mL Benzodiazepine, urine              Cutoff 200 ng/mL Methadone, urine                   Cutoff 300 ng/mL The urine drug screen provides only a preliminary, unconfirmed analytical test result and should not be used for non-medical purposes. Clinical consideration and professional judgment should be applied to any positive drug screen result due to possible interfering substances. A more specific alternate chemical method must be used in order to obtain a confirmed analytical result. Gas chromatography / mass spectrometry (GC/MS) is the preferred confirmat ory method. Performed at [facility], [address] [place], [place] [address]   Urinalysis, Routine w reflex microscopic     Status: Abnormal   Collection Time: 09/27/17  8:25 PM  Result Value Ref Range   Color, Urine YELLOW (A) YELLOW   APPearance CLEAR (A) CLEAR   Specific Gravity, Urine 1.018 1.005 - 1.030   pH 6.0 5.0 - 8.0   Glucose, UA >=500 (A) NEGATIVE mg/dL   Hgb urine dipstick SMALL (A) NEGATIVE   Bilirubin Urine NEGATIVE NEGATIVE   Ketones, ur NEGATIVE NEGATIVE mg/dL   Protein, ur 100 (A) NEGATIVE mg/dL   Nitrite NEGATIVE NEGATIVE   Leukocytes, UA MODERATE (A) NEGATIVE   RBC / HPF 0-5 0 - 5 RBC/hpf   WBC, UA >50  (H) 0 - 5 WBC/hpf   Bacteria, UA RARE (A) NONE SEEN   Squamous Epithelial / LPF 0-5 0 - 5   Mucus PRESENT     Comment: Performed at [facility], [address] [place], [place] [address]  Glucose, capillary     Status: Abnormal   Collection Time: 09/27/17  8:35 PM  Result Value  Ref Range   Glucose-Capillary 176 (H) 70 - 99 mg/dL  CBC     Status: Abnormal   Collection Time: 09/28/17  4:15 AM  Result Value Ref Range   WBC 12.3 (H) 3.8 - 10.6 K/uL   RBC 3.55 (L) 4.40 - 5.90 MIL/uL   Hemoglobin 10.4 (L) 13.0 - 18.0 g/dL   HCT 30.5 (L) 40.0 - 52.0 %   MCV 86.1 80.0 - 100.0 fL   MCH 29.4 26.0 - 34.0 pg   MCHC 34.1 32.0 - 36.0 g/dL   RDW 15.0 (H) 11.5 - 14.5 %   Platelets 232 150 - 440 K/uL    Comment: Performed at [facility], [address] [place], [place] [address]  Basic metabolic panel     Status: Abnormal   Collection Time: 09/28/17  4:15 AM  Result Value Ref Range   Sodium 133 (L) 135 - 145 mmol/L   Potassium 3.4 (L) 3.5 - 5.1 mmol/L   Chloride 92 (L) 98 - 111 mmol/L   CO2 25 22 - 32 mmol/L   Glucose, Bld 336 (H) 70 - 99 mg/dL   BUN 54 (H) 8 - 23 mg/dL   Creatinine, Ser 5.27 (H) 0.61 - 1.24 mg/dL   Calcium 8.2 (L) 8.9 - 10.3 mg/dL   GFR calc non Af Amer 10 (L) >60 mL/min   GFR calc Af Amer 11 (L) >60 mL/min    Comment: (NOTE) The eGFR has been calculated using the CKD EPI equation. This calculation has not been validated in all clinical situations. eGFR's persistently <60 mL/min signify possible Chronic Kidney Disease.    Anion gap 16 (H) 5 - 15    Comment: Performed at [facility], [address] [place], [place] [address]  Glucose, capillary     Status: Abnormal   Collection Time: 09/28/17  7:44 AM  Result Value Ref Range   Glucose-Capillary 326 (H) 70 - 99 mg/dL  Glucose, capillary     Status: Abnormal   Collection Time: 09/28/17 11:32 AM  Result Value Ref Range   Glucose-Capillary 243 (H) 70 - 99 mg/dL  Glucose, capillary      Status: Abnormal   Collection Time: 09/28/17  1:32 PM  Result Value Ref Range   Glucose-Capillary 226 (H) 70 - 99 mg/dL  Glucose, capillary     Status: Abnormal   Collection Time: 09/28/17  5:02 PM  Result Value Ref Range   Glucose-Capillary 260 (H) 70 - 99 mg/dL  Glucose, capillary     Status: Abnormal   Collection Time: 09/28/17  8:49 PM  Result Value Ref Range   Glucose-Capillary 298 (H) 70 - 99 mg/dL  Basic metabolic panel     Status: Abnormal   Collection Time: 09/29/17  4:59 AM  Result Value Ref Range   Sodium 132 (L) 135 - 145 mmol/L   Potassium 3.4 (L) 3.5 - 5.1 mmol/L   Chloride 92 (L) 98 - 111 mmol/L   CO2 28 22 - 32 mmol/L   Glucose, Bld 432 (H) 70 - 99 mg/dL   BUN 54 (H) 8 - 23 mg/dL   Creatinine, Ser 4.71 (H) 0.61 - 1.24 mg/dL   Calcium 8.4 (L) 8.9 - 10.3 mg/dL   GFR calc non Af Amer 11 (L) >60 mL/min   GFR calc Af Amer 13 (L) >60 mL/min    Comment: (NOTE) The eGFR has been calculated using the CKD EPI equation. This calculation has not been validated in all clinical situations. eGFR's  persistently <60 mL/min signify possible Chronic Kidney Disease.    Anion gap 12 5 - 15    Comment: Performed at [facility], [address] [place], [place] [address]  Phosphorus     Status: None   Collection Time: 09/29/17  4:59 AM  Result Value Ref Range   Phosphorus 4.5 2.5 - 4.6 mg/dL    Comment: Performed at [facility], [address] [place], [place] [address]  Hemoglobin A1c     Status: Abnormal   Collection Time: 09/29/17  5:02 AM  Result Value Ref Range   Hgb A1c MFr Bld 7.3 (H) 4.8 - 5.6 %    Comment: (NOTE) Pre diabetes:          5.7%-6.4% Diabetes:              >6.4% Glycemic control for   <7.0% adults with diabetes    Mean Plasma Glucose 162.81 mg/dL    Comment: Performed at [facility] [address] [place], [place] [address]  Glucose, capillary     Status: Abnormal   Collection Time: 09/29/17  7:25 AM  Result Value Ref  Range   Glucose-Capillary 394 (H) 70 - 99 mg/dL  Glucose, capillary     Status: Abnormal   Collection Time: 11/01/17  3:05 PM  Result Value Ref Range   Glucose-Capillary 116 (H) 70 - 99 mg/dL  CBC     Status: Abnormal   Collection Time: 11/01/17  3:42 PM  Result Value Ref Range   WBC 10.1 4.0 - 10.5 K/uL   RBC 3.64 (L) 4.22 - 5.81 MIL/uL   Hemoglobin 10.1 (L) 13.0 - 17.0 g/dL   HCT 30.2 (L) 39.0 - 52.0 %   MCV 83.0 80.0 - 100.0 fL   MCH 27.7 26.0 - 34.0 pg   MCHC 33.4 30.0 - 36.0 g/dL   RDW 15.3 11.5 - 15.5 %   Platelets 223 150 - 400 K/uL   nRBC 0.0 0.0 - 0.2 %    Comment: Performed at [facility], [address] [place], [place] [address]  Comprehensive metabolic panel     Status: Abnormal   Collection Time: 11/01/17  3:42 PM  Result Value Ref Range   Sodium 139 135 - 145 mmol/L   Potassium 3.0 (L) 3.5 - 5.1 mmol/L   Chloride 100 98 - 111 mmol/L   CO2 30 22 - 32 mmol/L   Glucose, Bld 149 (H) 70 - 99 mg/dL   BUN 15 8 - 23 mg/dL   Creatinine, Ser 1.85 (H) 0.61 - 1.24 mg/dL   Calcium 8.4 (L) 8.9 - 10.3 mg/dL   Total Protein 6.8 6.5 - 8.1 g/dL   Albumin 2.5 (L) 3.5 - 5.0 g/dL   AST 14 (L) 15 - 41 U/L   ALT 10 0 - 44 U/L   Alkaline Phosphatase 71 38 - 126 U/L   Total Bilirubin 0.7 0.3 - 1.2 mg/dL   GFR calc non Af Amer 34 (L) >60 mL/min   GFR calc Af Amer 40 (L) >60 mL/min    Comment: (NOTE) The eGFR has been calculated using the CKD EPI equation. This calculation has not been validated in all clinical situations. eGFR's persistently <60 mL/min signify possible Chronic Kidney Disease.    Anion gap 9 5 - 15    Comment: Performed at [facility], [address] [place], [place] [address]  Troponin I     Status: Abnormal   Collection Time:  11/01/17  3:42 PM  Result Value Ref Range   Troponin I 0.06 (HH) <0.03 ng/mL    Comment: CRITICAL RESULT CALLED TO, READ BACK BY AND VERIFIED WITH CHRISTINA JONES ON 11/01/17 AT 1617 QSD Performed at [facility], [address] [place], [place] [address]   Glucose, capillary     Status: Abnormal   Collection Time: 11/01/17  8:41 PM  Result Value Ref Range   Glucose-Capillary 181 (H) 70 - 99 mg/dL  MRSA PCR Screening     Status: Abnormal   Collection Time: 11/02/17  3:06 AM  Result Value Ref Range   MRSA by PCR POSITIVE (A) NEGATIVE    Comment:        The GeneXpert MRSA Assay (FDA approved for NASAL specimens only), is one component of a comprehensive MRSA colonization surveillance program. It is not intended to diagnose MRSA infection nor to guide or monitor treatment for MRSA infections. RESULT CALLED TO, READ BACK BY AND VERIFIED WITH: C/SARA [facility] @0510  11/02/17 FLC Performed at [facility], [address] [place], [place] [address]   Basic metabolic panel     Status: Abnormal   Collection Time: 11/02/17  5:46 AM  Result Value Ref Range   Sodium 138 135 - 145 mmol/L   Potassium 2.9 (L) 3.5 - 5.1 mmol/L   Chloride 101 98 - 111 mmol/L   CO2 28 22 - 32 mmol/L   Glucose, Bld 191 (H) 70 - 99 mg/dL   BUN 21 8 - 23 mg/dL   Creatinine, Ser 2.58 (H) 0.61 - 1.24 mg/dL   Calcium 8.1 (L) 8.9 - 10.3 mg/dL   GFR calc non Af Amer 23 (L) >60 mL/min   GFR calc Af Amer 27 (L) >60 mL/min    Comment: (NOTE) The eGFR has been calculated using the CKD EPI equation. This calculation has not been validated in all clinical situations. eGFR's persistently <60 mL/min signify possible Chronic Kidney Disease.    Anion gap 9 5 - 15    Comment: Performed at [facility], [address] [place], [place] [address]  CBC     Status: Abnormal   Collection Time: 11/02/17  5:46 AM  Result Value Ref Range   WBC 8.5 4.0 - 10.5 K/uL   RBC 3.44 (L) 4.22 - 5.81 MIL/uL   Hemoglobin 9.4 (L) 13.0 - 17.0 g/dL   HCT 28.4 (L) 39.0 - 52.0 %   MCV 82.6 80.0 - 100.0 fL   MCH 27.3 26.0 - 34.0 pg   MCHC 33.1 30.0 - 36.0 g/dL   RDW 15.2 11.5 - 15.5 %   Platelets 205 150 - 400 K/uL    nRBC 0.0 0.0 - 0.2 %    Comment: Performed at [facility], [address] [place], [place] [address]  Glucose, capillary     Status: Abnormal   Collection Time: 11/02/17  8:04 AM  Result Value Ref Range   Glucose-Capillary 171 (H) 70 - 99 mg/dL  Glucose, capillary     Status: Abnormal   Collection Time: 11/02/17 11:52 AM  Result Value Ref Range   Glucose-Capillary 192 (H) 70 - 99 mg/dL  Glucose, capillary     Status: Abnormal   Collection Time: 11/02/17  6:26 PM  Result Value Ref Range   Glucose-Capillary 169 (H) 70 - 99 mg/dL  Glucose, capillary     Status: Abnormal   Collection Time: 11/02/17  8:19 PM  Result Value Ref Range   Glucose-Capillary  199 (H) 70 - 99 mg/dL  Basic metabolic panel     Status: Abnormal   Collection Time: 11/03/17  5:25 AM  Result Value Ref Range   Sodium 137 135 - 145 mmol/L   Potassium 2.9 (L) 3.5 - 5.1 mmol/L   Chloride 100 98 - 111 mmol/L   CO2 27 22 - 32 mmol/L   Glucose, Bld 190 (H) 70 - 99 mg/dL   BUN 28 (H) 8 - 23 mg/dL   Creatinine, Ser 3.48 (H) 0.61 - 1.24 mg/dL   Calcium 8.4 (L) 8.9 - 10.3 mg/dL   GFR calc non Af Amer 16 (L) >60 mL/min   GFR calc Af Amer 18 (L) >60 mL/min    Comment: (NOTE) The eGFR has been calculated using the CKD EPI equation. This calculation has not been validated in all clinical situations. eGFR's persistently <60 mL/min signify possible Chronic Kidney Disease.    Anion gap 10 5 - 15    Comment: Performed at [facility], [address] [place], [place] [address]  Glucose, capillary     Status: Abnormal   Collection Time: 11/03/17  8:13 AM  Result Value Ref Range   Glucose-Capillary 178 (H) 70 - 99 mg/dL  Glucose, capillary     Status: Abnormal   Collection Time: 11/03/17  3:17 PM  Result Value Ref Range   Glucose-Capillary 169 (H) 70 - 99 mg/dL  Glucose, capillary     Status: Abnormal   Collection Time: 11/03/17  8:47 PM  Result Value Ref Range   Glucose-Capillary 215 (H) 70 - 99  mg/dL  Glucose, capillary     Status: Abnormal   Collection Time: 11/04/17  7:51 AM  Result Value Ref Range   Glucose-Capillary 165 (H) 70 - 99 mg/dL  Glucose, capillary     Status: Abnormal   Collection Time: 11/04/17 11:53 AM  Result Value Ref Range   Glucose-Capillary 198 (H) 70 - 99 mg/dL  Glucose, capillary     Status: Abnormal   Collection Time: 11/04/17  4:47 PM  Result Value Ref Range   Glucose-Capillary 259 (H) 70 - 99 mg/dL  Glucose, capillary     Status: Abnormal   Collection Time: 11/04/17  8:59 PM  Result Value Ref Range   Glucose-Capillary 160 (H) 70 - 99 mg/dL  Glucose, capillary     Status: Abnormal   Collection Time: 11/05/17  7:59 AM  Result Value Ref Range   Glucose-Capillary 184 (H) 70 - 99 mg/dL  Renal function panel     Status: Abnormal   Collection Time: 11/05/17 10:56 AM  Result Value Ref Range   Sodium 142 135 - 145 mmol/L   Potassium 3.4 (L) 3.5 - 5.1 mmol/L   Chloride 99 98 - 111 mmol/L   CO2 32 22 - 32 mmol/L   Glucose, Bld 176 (H) 70 - 99 mg/dL   BUN 30 (H) 8 - 23 mg/dL   Creatinine, Ser 4.60 (H) 0.61 - 1.24 mg/dL   Calcium 8.8 (L) 8.9 - 10.3 mg/dL   Phosphorus 2.7 2.5 - 4.6 mg/dL   Albumin 2.2 (L) 3.5 - 5.0 g/dL   GFR calc non Af Amer 11 (L) >60 mL/min   GFR calc Af Amer 13 (L) >60 mL/min    Comment: (NOTE) The eGFR has been calculated using the CKD EPI equation. This calculation has not been validated in all clinical situations. eGFR's persistently <60 mL/min signify possible Chronic Kidney Disease.    Anion gap 11 5 -  15    Comment: Performed at [facility], [address] [place], [place] [address]  Glucose, capillary     Status: Abnormal   Collection Time: 11/05/17  5:06 PM  Result Value Ref Range   Glucose-Capillary 134 (H) 70 - 99 mg/dL  Glucose, capillary     Status: Abnormal   Collection Time: 11/05/17  9:03 PM  Result Value Ref Range   Glucose-Capillary 171 (H) 70 - 99 mg/dL  Glucose, capillary     Status: Abnormal    Collection Time: 11/06/17  8:00 AM  Result Value Ref Range   Glucose-Capillary 150 (H) 70 - 99 mg/dL  Glucose, capillary     Status: Abnormal   Collection Time: 11/06/17 12:07 PM  Result Value Ref Range   Glucose-Capillary 237 (H) 70 - 99 mg/dL  Glucose, capillary     Status: Abnormal   Collection Time: 11/06/17  4:30 PM  Result Value Ref Range   Glucose-Capillary 194 (H) 70 - 99 mg/dL  Glucose, capillary     Status: Abnormal   Collection Time: 11/06/17  8:53 PM  Result Value Ref Range   Glucose-Capillary 222 (H) 70 - 99 mg/dL  CBC     Status: Abnormal   Collection Time: 11/07/17  5:24 AM  Result Value Ref Range   WBC 7.1 4.0 - 10.5 K/uL   RBC 3.72 (L) 4.22 - 5.81 MIL/uL   Hemoglobin 10.0 (L) 13.0 - 17.0 g/dL   HCT 31.2 (L) 39.0 - 52.0 %   MCV 83.9 80.0 - 100.0 fL   MCH 26.9 26.0 - 34.0 pg   MCHC 32.1 30.0 - 36.0 g/dL   RDW 15.3 11.5 - 15.5 %   Platelets 289 150 - 400 K/uL   nRBC 0.0 0.0 - 0.2 %    Comment: Performed at [facility], [address] [place], [place] [address]  Glucose, capillary     Status: Abnormal   Collection Time: 11/07/17  8:01 AM  Result Value Ref Range   Glucose-Capillary 224 (H) 70 - 99 mg/dL  Glucose, capillary     Status: Abnormal   Collection Time: 11/07/17 12:03 PM  Result Value Ref Range   Glucose-Capillary 291 (H) 70 - 99 mg/dL  Glucose, capillary     Status: Abnormal   Collection Time: 11/07/17  4:24 PM  Result Value Ref Range   Glucose-Capillary 320 (H) 70 - 99 mg/dL    Radiology: Dg Chest 2 View  Result Date: 11/01/2017 CLINICAL DATA:  Altered mental status.  Renal failure EXAM: CHEST - 2 VIEW COMPARISON:  September 28, 2017 FINDINGS: There is diffuse interstitial fibrosis; there may be a degree of superimposed chronic interstitial edema. There is no consolidation. Heart is mildly enlarged with pulmonary vascularity normal. No adenopathy. There is aortic atherosclerosis. Central catheter tip is in the superior vena cava near  the cavoatrial junction. No appreciable bone lesions. IMPRESSION: Diffuse pulmonary fibrosis. There may be a degree of superimposed chronic interstitial edema. There is no consolidation. There is cardiomegaly with pulmonary vascularity normal. There is aortic atherosclerosis. Aortic Atherosclerosis (ICD10-I70.0). Electronically Signed   By: Lowella Grip III M.D.   On: 11/01/2017 16:23   Ct Head Wo Contrast  Result Date: 11/01/2017 CLINICAL DATA:  72 year old male with altered mental status. Patient on dialysis. EXAM: CT HEAD WITHOUT CONTRAST TECHNIQUE: Contiguous axial images were obtained from the base of the skull through the vertex without intravenous contrast. COMPARISON:  09/25/2017 CT and MR FINDINGS: Brain: No evidence  of acute infarction, hemorrhage, hydrocephalus, extra-axial collection or mass lesion/mass effect. Atrophy and chronic small-vessel white matter ischemic changes noted. Vascular: Vertebral and carotid atherosclerotic calcifications again noted. Skull: Normal. Negative for fracture or focal lesion. Sinuses/Orbits: Near complete opacification of LEFT frontal, anterior ethmoid and LEFT maxillary sinuses noted. Fluid in the LEFT frontal sinus noted. Other: None IMPRESSION: 1. No evidence of acute intracranial abnormality 2. Atrophy and chronic small-vessel white matter ischemic changes 3. LEFT paranasal sinusitis, with acute component/fluid in the LEFT frontal sinus. Electronically Signed   By: Margarette Canada M.D.   On: 11/01/2017 16:03    No results found.  Dg Chest 2 View  Result Date: 11/01/2017 CLINICAL DATA:  Altered mental status.  Renal failure EXAM: CHEST - 2 VIEW COMPARISON:  September 28, 2017 FINDINGS: There is diffuse interstitial fibrosis; there may be a degree of superimposed chronic interstitial edema. There is no consolidation. Heart is mildly enlarged with pulmonary vascularity normal. No adenopathy. There is aortic atherosclerosis. Central catheter tip is in the  superior vena cava near the cavoatrial junction. No appreciable bone lesions. IMPRESSION: Diffuse pulmonary fibrosis. There may be a degree of superimposed chronic interstitial edema. There is no consolidation. There is cardiomegaly with pulmonary vascularity normal. There is aortic atherosclerosis. Aortic Atherosclerosis (ICD10-I70.0). Electronically Signed   By: Lowella Grip III M.D.   On: 11/01/2017 16:23   Ct Head Wo Contrast  Result Date: 11/01/2017 CLINICAL DATA:  72 year old male with altered mental status. Patient on dialysis. EXAM: CT HEAD WITHOUT CONTRAST TECHNIQUE: Contiguous axial images were obtained from the base of the skull through the vertex without intravenous contrast. COMPARISON:  09/25/2017 CT and MR FINDINGS: Brain: No evidence of acute infarction, hemorrhage, hydrocephalus, extra-axial collection or mass lesion/mass effect. Atrophy and chronic small-vessel white matter ischemic changes noted. Vascular: Vertebral and carotid atherosclerotic calcifications again noted. Skull: Normal. Negative for fracture or focal lesion. Sinuses/Orbits: Near complete opacification of LEFT frontal, anterior ethmoid and LEFT maxillary sinuses noted. Fluid in the LEFT frontal sinus noted. Other: None IMPRESSION: 1. No evidence of acute intracranial abnormality 2. Atrophy and chronic small-vessel white matter ischemic changes 3. LEFT paranasal sinusitis, with acute component/fluid in the LEFT frontal sinus. Electronically Signed   By: Margarette Canada M.D.   On: 11/01/2017 16:03   Ct Chest W Contrast  Result Date: 11/04/2017 CLINICAL DATA:  Persistent cough and shortness of breath. EXAM: CT CHEST WITH CONTRAST TECHNIQUE: Multidetector CT imaging of the chest was performed during intravenous contrast administration. CONTRAST:  15m OMNIPAQUE IOHEXOL 300 MG/ML  SOLN COMPARISON:  CT chest 8/11/9 FINDINGS: Cardiovascular: Mild cardiac enlargement. No pericardial effusion identified. Aortic atherosclerosis.  Calcification within the RCA, LAD coronary arteries noted. The main pulmonary artery measures 4.1 cm in diameter. Mediastinum/Nodes: Multinodular thyroid gland identified. The trachea appears patent and is midline. Progressive mediastinal and hilar adenopathy is identified. Index left pre-vascular lymph node measures 1.7 cm, image 49/2. Previously 1.1 cm. Adjacent pre-vascular node measures 1.5 cm, image 51/2. Previously 0.6 cm. Right hilar lymph node measures 2.1 cm, image 68/2. Previously 1.0 cm. Left hilar lymph node measures 1.7 cm, image 78/2. Previously 1.0 cm. Lungs/Pleura: Extensive respiratory motion artifact diminishes exam detail. No pleural effusion. Moderate progressive changes of emphysema. Pleural thickening along the lateral and posterior left mid and left lower lung identified. No airspace consolidation, atelectasis or pneumothorax identified. Upper Abdomen: No acute abnormality. Musculoskeletal: No chest wall abnormality. No acute or significant osseous findings. IMPRESSION: 1. Exam detail is diminished secondary to  respiratory motion artifact. 2. Progressive mediastinal and bilateral hilar adenopathy is identified. Differential considerations include granulomatous inflammation/infection (including sarcoid), lymphoproliferative disorder or metastatic adenopathy. Careful clinical correlation is advised. Follow-up imaging in 3 months with contrast enhanced CT of the chest is recommended. This recommendation follows ACR consensus guidelines: Managing Incidental Findings on Thoracic CT: Mediastinal and Cardiovascular Findings. A White Paper of the ACR Incidental Findings Committee. J Am Coll Radiol. 2018; 15: 0973-5329. 3. 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 4. Aortic Atherosclerosis (ICD10-I70.0) and Emphysema (ICD10-J43.9). 5. Multi vessel coronary artery atherosclerotic calcifications Electronically Signed   By: Kerby Moors M.D.   On: 11/04/2017 12:19       Assessment and Plan: Patient Active Problem List   Diagnosis Date Noted  . Shortness of breath 11/04/2017  . Acute metabolic encephalopathy 92/42/6834  . Sepsis ([place]) 09/26/2017  . Left arm weakness 09/25/2017  . Dyslipidemia associated with type 2 diabetes mellitus ([place]) 03/13/2016  . Diabetic ulcer of toe of right foot associated with type 2 diabetes mellitus, limited to breakdown of skin ([place]) 03/07/2016  . Microscopic hematuria 05/30/2015  . 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 Dyslipidemia 08/31/2014  . Tobacco abuse 08/31/2014  . Diabetes mellitus with renal manifestation ([place]) 08/31/2014  . COPD (chronic obstructive pulmonary disease) ([place]) 08/31/2014  . Microalbuminuria 08/31/2014  . ED (erectile dysfunction) 08/31/2014  . Hypertension   . Dyspnea 10/09/2011    1. 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  For completeness I have suggested that we do a PET scan since he is not willing to do a biopsy.  I will go ahead and order a PET scan for the diffuse lymphadenopathy to make certain this is not malignancy. 2. As the steroids concerned we will continue him on the current dosage and then begin tapering the steroids gradually currently he is on 40 mg daily. 3. 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 COPD we will get pulmonary function studies done and assess his underlying lung functions. 5. SOB spirometry was done in the office reviewed.  General Counseling: I have discussed the findings of the evaluation and examination with Joseph Newman.  I have also discussed any further diagnostic evaluation thatmay be needed or ordered today. Shade verbalizes understanding of the findings of todays visit. We  also reviewed his medications today and discussed drug interactions and side effects including but not limited excessive drowsiness and altered mental states. We also discussed that there is always a risk not just to him but also people around him. he has been encouraged to call the office with any questions or concerns that should arise related to todays visit.    Time spent: 36mn  I have personally obtained a history, examined the patient, evaluated laboratory and imaging results, formulated the assessment and plan and placed orders.    SAllyne Gee MD [facility] LPPulmonary and Critical Care Sleep medicine

## 2017-11-19 NOTE — Patient Instructions (Signed)
Sarcoidosis Sarcoidosis is a disease that causes inflammation in your organs and other areas of your body. The lungs are most often affected (pulmonary sarcoidosis). Sarcoidosis can also affect your lymph nodes, liver, eyes, skin, or any other body tissue. When you have sarcoidosis, small clumps of tissue (granulomas) form in the affected area of your body. Granulomas are made up of your body's defense (immune) cells. Inflammation results when your body reacts to a harmful substance. Normally, inflammation goes away after immune cells get rid of the harmful substance. In sarcoidosis, the immune cells form granulomas instead. What are the causes? The exact cause of sarcoidosis is not known. Something triggers the immune system to respond, such as dust, chemicals, bacteria, or a virus. What increases the risk? You may be at a greater risk for sarcoidosis if you:  Have a family history of the disease.  Are African American.  Are of Northern European ancestry.  Are 19-53 years old.  Are male.  What are the signs or symptoms? Many people with sarcoidosis have no symptoms. Others have very mild symptoms. Sarcoidosis most often affects the lungs. Symptoms include:  Chest pain.  Coughing.  Wheezing.  Shortness of breath.  Other common symptoms include:  Night sweats.  Weight loss.  Fatigue.  Depression.  A sense of uneasiness.  How is this diagnosed? Sarcoidosis may be diagnosed by:  Medical history and physical exam.  Chest X-ray. This looks for granulomas in your lungs.  Lung function tests. These measure your breathing and look for problems related to sarcoidosis.  Examining a sample of tissue under a microscope (biopsy).  How is this treated? Sarcoidosis usually clears up without treatment. You may take medicines to reduce inflammation or relieve symptoms. These may include:  Prednisone. This steroid reduces inflammation related to sarcoidosis.  Chloroquine or  hydroxychloroquine. These are antimalarial medicines used to treat sarcoidosis that affects the skin or brain.  Methotrexate, leflunomide, or azathioprine. These medicines affect the immune system and can help with sarcoidosis in the joints, eyes, skin, or lungs.  Inhalers. Inhaled medicines can help you breathe if sarcoidosis is affecting your lungs.  Follow these instructions at home:  Do not use any tobacco products, including cigarettes, chewing tobacco, or electronic cigarettes. If you need help quitting, ask your health care provider.  Avoid secondhand smoke.  Avoid irritating dust and chemicals. Stay indoors on days when air quality is poor in your area.  Take medicines only as directed by your health care provider. Contact a health care provider if:  You have vision problems.  You have shortness of breath.  You have a dry, persistent cough.  You have an irregular heartbeat.  You have pain or ache in your joints, hands, or feet.  You have an unexplained rash. Get help right away if: You have chest pain. This information is not intended to replace advice given to you by your health care provider. Make sure you discuss any questions you have with your health care provider. Document Released: 11/10/2003 Document Revised: 06/17/2015 Document Reviewed: 05/07/2013 Elsevier Interactive Patient Education  [employer].

## 2017-11-29 ENCOUNTER — Ambulatory Visit: Payer: Medicare Other

## 2017-11-30 ENCOUNTER — Ambulatory Visit: Payer: Medicare Other

## 2017-12-05 ENCOUNTER — Ambulatory Visit: Payer: Medicare Other | Admitting: Internal Medicine

## 2017-12-05 DIAGNOSIS — R0602 Shortness of breath: Secondary | ICD-10-CM

## 2017-12-05 LAB — PULMONARY FUNCTION TEST

## 2017-12-07 ENCOUNTER — Ambulatory Visit: Payer: Medicare Other

## 2017-12-12 ENCOUNTER — Ambulatory Visit
Admission: RE | Admit: 2017-12-12 | Discharge: 2017-12-12 | Disposition: A | Discharge status: Home / Self Care | Payer: Medicare Other | Source: Ambulatory Visit | Attending: Internal Medicine | Admitting: Internal Medicine

## 2017-12-12 DIAGNOSIS — R590 Localized enlarged lymph nodes: Secondary | ICD-10-CM | POA: Diagnosis not present

## 2017-12-12 DIAGNOSIS — R918 Other nonspecific abnormal finding of lung field: Secondary | ICD-10-CM | POA: Diagnosis not present

## 2017-12-12 DIAGNOSIS — I517 Cardiomegaly: Secondary | ICD-10-CM | POA: Insufficient documentation

## 2017-12-12 DIAGNOSIS — D869 Sarcoidosis, unspecified: Secondary | ICD-10-CM | POA: Diagnosis not present

## 2017-12-12 DIAGNOSIS — I2510 Atherosclerotic heart disease of native coronary artery without angina pectoris: Secondary | ICD-10-CM | POA: Insufficient documentation

## 2017-12-12 DIAGNOSIS — I700 Atherosclerosis of aorta: Secondary | ICD-10-CM | POA: Diagnosis not present

## 2017-12-12 NOTE — Procedures (Signed)
[place], [id number]  DATE OF SERVICE: December 05, 2017  Complete Pulmonary Function Testing Interpretation:  FINDINGS:  The forced vital capacity is moderately decreased.  The FEV1 is 1.73 L which is 47% of predicted and is severely decreased.  The FEV1 FVC ratio is mildly decreased.  Postbronchodilator there is no significant change in the FEV1 however clinical improvement may occur in the absence of spirometric improvement and does not preclude the use of bronchodilators.  IMPRESSION:  Spirometry is consistent with severe obstructive lung disease.  Lung volumes are not available to determine any restrictive component is clinical correlation is recommended  Allyne Gee, MD [facility] Sleep Medicine

## 2017-12-14 ENCOUNTER — Encounter
Admission: RE | Admit: 2017-12-14 | Discharge: 2017-12-14 | Disposition: A | Discharge status: Home / Self Care | Payer: Medicare Other | Source: Ambulatory Visit | Attending: Internal Medicine | Admitting: Internal Medicine

## 2017-12-14 DIAGNOSIS — G9341 Metabolic encephalopathy: Secondary | ICD-10-CM

## 2017-12-14 DIAGNOSIS — R590 Localized enlarged lymph nodes: Secondary | ICD-10-CM | POA: Insufficient documentation

## 2017-12-14 HISTORY — DX: Metabolic encephalopathy: G93.41

## 2017-12-14 LAB — GLUCOSE, CAPILLARY: Glucose-Capillary: 114 mg/dL — ABNORMAL HIGH (ref 70–99)

## 2017-12-14 MED ORDER — FLUDEOXYGLUCOSE F - 18 (FDG) INJECTION
10.2000 | Freq: Once | INTRAVENOUS | Status: AC | PRN
  Administered 2017-12-14: 10.36 via INTRAVENOUS

## 2017-12-17 ENCOUNTER — Ambulatory Visit: Payer: Self-pay | Admitting: Internal Medicine

## 2017-12-18 ENCOUNTER — Ambulatory Visit: Payer: Medicare Other | Admitting: Family Medicine

## 2017-12-18 ENCOUNTER — Ambulatory Visit: Payer: Medicare Other

## 2017-12-18 ENCOUNTER — Ambulatory Visit: Payer: Self-pay | Admitting: Internal Medicine

## 2017-12-19 ENCOUNTER — Encounter: Payer: Self-pay | Admitting: Adult Health

## 2017-12-19 ENCOUNTER — Ambulatory Visit: Payer: Medicare Other | Admitting: Adult Health

## 2017-12-19 VITALS — BP 132/82 | HR 80 | Resp 16 | Ht 77.0 in | Wt 196.0 lb

## 2017-12-19 DIAGNOSIS — N186 End stage renal disease: Secondary | ICD-10-CM

## 2017-12-19 DIAGNOSIS — D869 Sarcoidosis, unspecified: Secondary | ICD-10-CM

## 2017-12-19 DIAGNOSIS — J449 Chronic obstructive pulmonary disease, unspecified: Secondary | ICD-10-CM | POA: Diagnosis not present

## 2017-12-19 DIAGNOSIS — R590 Localized enlarged lymph nodes: Secondary | ICD-10-CM | POA: Diagnosis not present

## 2017-12-19 DIAGNOSIS — Z992 Dependence on renal dialysis: Secondary | ICD-10-CM

## 2017-12-19 NOTE — Progress Notes (Signed)
[facility] [place], [place] [address]  Pulmonary Sleep Medicine   Office Visit Note  Patient Name: Joseph Newman DOB: 22-Jun-1945 MRN [id number]  Date of Service: 12/19/2017  Complaints/HPI: Pt is here for follow up on copd, as well as, his PET scan, PFT, and Chest CT. his PFT results were consistent with severe obstructive lung disease. His pet Scan showed "Isolated hypermetabolic borderline mediastinal adenopathy. Given size decrease compared to 11/04/2017, a reactive etiology is favored. Similarly, other non hypermetabolic thoracic nodes have decreased in size compared to that exam, favoring a reactive etiology".  Chest Ct impression is as follows: IMPRESSION: 1. Significant decrease in bilateral mediastinal and hilar lymphadenopathy compatible with a reactive/benign process. 2. Unchanged mild subpleural nodularity and mild basilar interlobular septal thickening. Sarcoid is within the differential. 3. Cardiomegaly and coronary artery disease 4.  Aortic Atherosclerosis (ICD10-I70.0). Patient and wife deny any further needs at this time.  ROS  General: (-) fever, (-) chills, (-) night sweats, (-) weakness Skin: (-) rashes, (-) itching,. Eyes: (-) visual changes, (-) redness, (-) itching. Nose and Sinuses: (-) nasal stuffiness or itchiness, (-) postnasal drip, (-) nosebleeds, (-) sinus trouble. Mouth and Throat: (-) sore throat, (-) hoarseness. Neck: (-) swollen glands, (-) enlarged thyroid, (-) neck pain. Respiratory: - cough, (-) bloody sputum, - shortness of breath, - wheezing. Cardiovascular: - ankle swelling, (-) chest pain. Lymphatic: (-) lymph node enlargement. Neurologic: (-) numbness, (-) tingling. Psychiatric: (-) anxiety, (-) depression   Current Medication: Outpatient Encounter Medications as of 12/19/2017  Medication Sig  . acetaminophen (TYLENOL) 325 MG tablet Take 2 tablets (650 mg total) by mouth every 6 (six) hours as needed for mild  pain (or Fever >/= 101).  Marland Kitchen albuterol (PROVENTIL) (2.5 MG/3ML) 0.083% nebulizer solution Take 3 mLs (2.5 mg total) by nebulization every 6 (six) hours.  Marland Kitchen allopurinol (ZYLOPRIM) 100 MG tablet Take 0.5 tablets (50 mg total) by mouth every other day.  Marland Kitchen amLODipine (NORVASC) 5 MG tablet Take 1 tablet (5 mg total) by mouth daily.  Marland Kitchen aspirin EC 81 MG tablet Take 81 mg by mouth daily.  . Blood Glucose Monitoring Suppl (ONE TOUCH ULTRA SYSTEM KIT) W/DEVICE KIT 1 kit by Does not apply route once.  . calcitRIOL (ROCALTROL) 0.25 MCG capsule Take 0.25 mcg by mouth daily.   . carvedilol (COREG) 6.25 MG tablet Take 6.25 mg by mouth 2 (two) times daily with a meal.   . Cholecalciferol (VITAMIN D) 2000 UNITS CAPS Take 1 capsule by mouth daily.   . citalopram (CELEXA) 10 MG tablet Take 10 mg by mouth daily.  . Cyanocobalamin (VITAMIN B-12) 1000 MCG SUBL Take 1 tablet by mouth daily.  . fluticasone (FLONASE) 50 MCG/ACT nasal spray Place 2 sprays into both nostrils daily.  . insulin aspart (NOVOLOG) 100 UNIT/ML injection 3 units with meals if eating  . insulin detemir (LEVEMIR) 100 UNIT/ML injection Inject 0.05 mLs (5 Units total) into the skin at bedtime.  . irbesartan (AVAPRO) 300 MG tablet Take 1 tablet (300 mg total) by mouth daily.  . multivitamin (RENA-VIT) TABS tablet Take 1 tablet by mouth at bedtime.  . Nutritional Supplements (FEEDING SUPPLEMENT, NEPRO CARB STEADY,) LIQD Take 237 mLs by mouth 3 (three) times daily between meals.  Marland Kitchen oxyCODONE-acetaminophen (PERCOCET/ROXICET) 5-325 MG tablet Take 1 tablet by mouth every 8 (eight) hours as needed for moderate pain or severe pain.  . OXYGEN Inhale into the lungs. 2. liters  . predniSONE (DELTASONE) 10 MG tablet 4  tabs p.o. daily for 3 days, 3-1/2 tab daily for 3 days, 3 tablets daily for 3 days, 2-1/2 tablets for 3 days, 2 tablets daily for 3 days and continued until seeing pulmonology.  . tamsulosin (FLOMAX) 0.4 MG CAPS capsule Take 0.4 mg by mouth daily  after breakfast.    No facility-administered encounter medications on file as of 12/19/2017.     Surgical History: Past Surgical History:  Procedure Laterality Date  . DIALYSIS/PERMA CATHETER INSERTION N/A 09/28/2017   Procedure: DIALYSIS/PERMA CATHETER INSERTION;  Surgeon: Katha Cabal, MD;  Location: [facility];  Service: Cardiovascular;  Laterality: N/A;  . foot surgery      Medical History: Past Medical History:  Diagnosis Date  . Acute metabolic encephalopathy 36/14/4315   Per my chart  . Arthritis   . Chronic airway obstruction, not elsewhere classified   . 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 Holley Raring, nephrologist  . Depressive disorder, not elsewhere classified   . Heart murmur   . Hyperlipidemia   . Hypertension   . Hypertrophy of prostate without urinary obstruction and other lower urinary tract symptoms (LUTS)   . Impotence of organic origin   . Microalbuminuria   . 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 Obesity, unspecified   . Plantar wart   . Sleep apnea   . Synovitis and tenosynovitis, unspecified   . Type II or unspecified type diabetes mellitus without mention of complication, uncontrolled   . Unspecified venous (peripheral) insufficiency     Family History: Family History  Problem Relation Age of Onset  . Hypertension Mother   . Kidney disease Neg Hx   . Prostate cancer Neg Hx     Social History: Social History   Socioeconomic History  . Marital status: Married    Spouse name: Not on file  . Number of children: 2  . Years of education: Not on file  . Highest education level: Not on file  Occupational History  . Not on file  Social Needs  . Financial resource strain: Patient refused  . Food insecurity:    Worry: Patient refused    Inability: Patient refused  . Transportation needs:    Medical: Patient refused    Non-medical: Patient refused  Tobacco Use  . Smoking status: Former  Smoker    Packs/day: 0.25    Years: 30.00    Pack years: 7.50    Types: Cigarettes, Cigars    Last attempt to quit: 07/23/2017    Years since quitting: 0.4  . Smokeless tobacco: Never Used  . Tobacco comment: he is cutting down, used to smoke one pack day   Substance and Sexual Activity  . Alcohol use: Yes    Alcohol/week: 0.0 standard drinks    Comment: occasional  . Drug use: No  . Sexual activity: Not Currently  Lifestyle  . Physical activity:    Days per week: Patient refused    Minutes per session: Patient refused  . Stress: Patient refused  Relationships  . Social connections:    Talks on phone: Patient refused    Gets together: Patient refused    Attends religious service: Patient refused    Active member of club or organization: Patient refused    Attends meetings of clubs or organizations: Patient refused    Relationship status: Patient refused  . Intimate partner violence:    Fear of current or ex partner: Patient refused    Emotionally abused:  Patient refused    Physically abused: Patient refused    Forced sexual activity: Patient refused  Other Topics Concern  . Not on file  Social History Narrative   Lives with wife    Vital Signs: Blood pressure 132/82, pulse 80, resp. rate 16, height 6' 5"  (1.956 m), weight 196 lb (88.9 kg), SpO2 96 %.  Examination: General Appearance: The patient is well-developed, well-nourished, and in no distress. Skin: Gross inspection of skin unremarkable. Head: normocephalic, no gross deformities. Eyes: no gross deformities noted. ENT: ears appear grossly normal no exudates. Neck: Supple. No thyromegaly. No LAD. Respiratory: clear bilateraly. Cardiovascular: Normal S1 and S2 without murmur or rub. Extremities: No cyanosis. pulses are equal. Neurologic: Alert and oriented. No involuntary movements.  LABS: Recent Results (from the past 2160 hour(s))  Glucose, capillary     Status: Abnormal   Collection Time: 09/25/17  1:30 PM   Result Value Ref Range   Glucose-Capillary 179 (H) 70 - 99 mg/dL  Ethanol     Status: None   Collection Time: 09/25/17  1:37 PM  Result Value Ref Range   Alcohol, Ethyl (B) <10 <10 mg/dL    Comment: (NOTE) Lowest detectable limit for serum alcohol is 10 mg/dL. For medical purposes only. Performed at [facility], [address] [place], [place] [address]   Protime-INR     Status: None   Collection Time: 09/25/17  1:37 PM  Result Value Ref Range   Prothrombin Time 14.3 11.4 - 15.2 seconds   INR 1.12     Comment: Performed at [facility], [address] [place], [place] [address]  APTT     Status: None   Collection Time: 09/25/17  1:37 PM  Result Value Ref Range   aPTT 36 24 - 36 seconds    Comment: Performed at [facility], [address] [place], [place] [address]  CBC     Status: Abnormal   Collection Time: 09/25/17  1:37 PM  Result Value Ref Range   WBC 15.8 (H) 3.8 - 10.6 K/uL   RBC 3.64 (L) 4.40 - 5.90 MIL/uL   Hemoglobin 10.6 (L) 13.0 - 18.0 g/dL   HCT 31.3 (L) 40.0 - 52.0 %   MCV 85.9 80.0 - 100.0 fL   MCH 29.0 26.0 - 34.0 pg   MCHC 33.8 32.0 - 36.0 g/dL   RDW 15.2 (H) 11.5 - 14.5 %   Platelets 226 150 - 440 K/uL    Comment: Performed at [facility], [address] [place], [place] [address]  Differential     Status: Abnormal   Collection Time: 09/25/17  1:37 PM  Result Value Ref Range   Neutrophils Relative % 81 %   Neutro Abs 12.8 (H) 1.4 - 6.5 K/uL   Lymphocytes Relative 9 %   Lymphs Abs 1.4 1.0 - 3.6 K/uL   Monocytes Relative 10 %   Monocytes Absolute 1.5 (H) 0.2 - 1.0 K/uL   Eosinophils Relative 0 %   Eosinophils Absolute 0.0 0 - 0.7 K/uL   Basophils Relative 0 %   Basophils Absolute 0.1 0 - 0.1 K/uL    Comment: Performed at [facility], [address] [place], [place] [address]  Comprehensive metabolic panel     Status: Abnormal   Collection Time: 09/25/17  1:37 PM  Result Value Ref Range    Sodium 136 135 - 145 mmol/L   Potassium 3.7 3.5 - 5.1 mmol/L   Chloride 96 (L) 98 -  111 mmol/L   CO2 27 22 - 32 mmol/L   Glucose, Bld 214 (H) 70 - 99 mg/dL   BUN 54 (H) 8 - 23 mg/dL   Creatinine, Ser 5.78 (H) 0.61 - 1.24 mg/dL   Calcium 8.5 (L) 8.9 - 10.3 mg/dL   Total Protein 7.0 6.5 - 8.1 g/dL   Albumin 2.9 (L) 3.5 - 5.0 g/dL   AST 16 15 - 41 U/L   ALT 9 0 - 44 U/L   Alkaline Phosphatase 74 38 - 126 U/L   Total Bilirubin 0.6 0.3 - 1.2 mg/dL   GFR calc non Af Amer 9 (L) >60 mL/min   GFR calc Af Amer 10 (L) >60 mL/min    Comment: (NOTE) The eGFR has been calculated using the CKD EPI equation. This calculation has not been validated in all clinical situations. eGFR's persistently <60 mL/min signify possible Chronic Kidney Disease.    Anion gap 13 5 - 15    Comment: Performed at [facility], [address] [place], [place] [address]  Troponin I     Status: Abnormal   Collection Time: 09/25/17  1:37 PM  Result Value Ref Range   Troponin I 0.03 (HH) <0.03 ng/mL    Comment: CRITICAL RESULT CALLED TO, READ BACK BY AND VERIFIED WITH CHRISTINA JAMES @1431  09/25/17 AKT Performed at [facility], [address] [place], [place] [address]   Blood culture (routine x 2)     Status: None   Collection Time: 09/25/17  1:37 PM  Result Value Ref Range   Specimen Description BLOOD RIGHT FA    Special Requests      BOTTLES DRAWN AEROBIC AND ANAEROBIC Blood Culture results may not be optimal due to an excessive volume of blood received in culture bottles   Culture      NO GROWTH 5 DAYS Performed at [facility], [address] [place], [place] [address]    Report Status 09/30/2017 FINAL   Lactic acid, plasma     Status: Abnormal   Collection Time: 09/25/17  1:37 PM  Result Value Ref Range   Lactic Acid, Venous 2.1 (HH) 0.5 - 1.9 mmol/L    Comment: CRITICAL RESULT CALLED TO, READ BACK BY AND VERIFIED WITH Velna Hatchet MILLER 09/25/17 1832 KLW Performed at [facility], [address] [place], [place] [address]   Blood culture (routine x 2)     Status: None   Collection Time: 09/25/17  2:51 PM  Result Value Ref Range   Specimen Description BLOOD RIGHT ANTECUBITAL    Special Requests      BOTTLES DRAWN AEROBIC AND ANAEROBIC Blood Culture results may not be optimal due to an excessive volume of blood received in culture bottles   Culture      NO GROWTH 5 DAYS Performed at [facility], [address] [place], [place] [address]    Report Status 10/01/2017 FINAL   Uric acid     Status: None   Collection Time: 09/25/17  6:42 PM  Result Value Ref Range   Uric Acid, Serum 6.4 3.7 - 8.6 mg/dL    Comment: Performed at [facility], [address] [place], [place] [address]  CK     Status: None   Collection Time: 09/25/17  6:42 PM  Result Value Ref Range   Total CK 49 49 - 397 U/L    Comment: Performed at [facility], [address] [place], [place] [address]  ECHOCARDIOGRAM COMPLETE  Status: None   Collection Time: 09/25/17  7:32 PM  Result Value Ref Range   Weight 3,721.36 oz   Height 77 in   BP 139/73 mmHg  Glucose, capillary     Status: Abnormal   Collection Time: 09/25/17  8:22 PM  Result Value Ref Range   Glucose-Capillary 183 (H) 70 - 99 mg/dL  Hemoglobin A1c     Status: Abnormal   Collection Time: 09/26/17  3:54 AM  Result Value Ref Range   Hgb A1c MFr Bld 7.2 (H) 4.8 - 5.6 %    Comment: (NOTE) Pre diabetes:          5.7%-6.4% Diabetes:              >6.4% Glycemic control for   <7.0% adults with diabetes    Mean Plasma Glucose 159.94 mg/dL    Comment: Performed at [facility], [place] [address], [place], [place] [address]  Lipid panel     Status: Abnormal   Collection Time: 09/26/17  3:54 AM  Result Value Ref Range   Cholesterol 66 0 - 200 mg/dL   Triglycerides 44 <150 mg/dL   HDL 29 (L) >40 mg/dL   Total CHOL/HDL Ratio 2.3 RATIO   VLDL 9 0 - 40 mg/dL   LDL Cholesterol 28 0 -  99 mg/dL    Comment:        Total Cholesterol/HDL:CHD Risk Coronary Heart Disease Risk Table                     Men   Women  1/2 Average Risk   3.4   3.3  Average Risk       5.0   4.4  2 X Average Risk   9.6   7.1  3 X Average Risk  23.4   11.0        Use the calculated Patient Ratio above and the CHD Risk Table to determine the patient's CHD Risk.        ATP III CLASSIFICATION (LDL):  <100     mg/dL   Optimal  100-129  mg/dL   Near or Above                    Optimal  130-159  mg/dL   Borderline  160-189  mg/dL   High  >190     mg/dL   Very High Performed at [facility], [address] [place], [place] [address]   Glucose, capillary     Status: Abnormal   Collection Time: 09/26/17  7:44 AM  Result Value Ref Range   Glucose-Capillary 172 (H) 70 - 99 mg/dL  CBC     Status: Abnormal   Collection Time: 09/26/17  8:19 AM  Result Value Ref Range   WBC 18.6 (H) 3.8 - 10.6 K/uL   RBC 3.39 (L) 4.40 - 5.90 MIL/uL   Hemoglobin 10.1 (L) 13.0 - 18.0 g/dL   HCT 28.9 (L) 40.0 - 52.0 %   MCV 85.3 80.0 - 100.0 fL   MCH 29.8 26.0 - 34.0 pg   MCHC 35.0 32.0 - 36.0 g/dL   RDW 15.0 (H) 11.5 - 14.5 %   Platelets 205 150 - 440 K/uL    Comment: Performed at [facility], [address], [place], [place] [address]  Basic metabolic panel     Status: Abnormal   Collection Time: 09/26/17  8:19 AM  Result Value Ref Range   Sodium 136  135 - 145 mmol/L   Potassium 3.9 3.5 - 5.1 mmol/L   Chloride 95 (L) 98 - 111 mmol/L   CO2 28 22 - 32 mmol/L   Glucose, Bld 184 (H) 70 - 99 mg/dL   BUN 63 (H) 8 - 23 mg/dL   Creatinine, Ser 6.08 (H) 0.61 - 1.24 mg/dL   Calcium 8.2 (L) 8.9 - 10.3 mg/dL   GFR calc non Af Amer 8 (L) >60 mL/min   GFR calc Af Amer 9 (L) >60 mL/min    Comment: (NOTE) The eGFR has been calculated using the CKD EPI equation. This calculation has not been validated in all clinical situations. eGFR's persistently <60 mL/min signify possible Chronic  Kidney Disease.    Anion gap 13 5 - 15    Comment: Performed at [facility], [address] [place], [place] [address]  Glucose, capillary     Status: Abnormal   Collection Time: 09/26/17 11:40 AM  Result Value Ref Range   Glucose-Capillary 215 (H) 70 - 99 mg/dL  Body fluid cell count with differential     Status: Abnormal   Collection Time: 09/26/17  4:54 PM  Result Value Ref Range   Fluid Type-FCT Peritoneal    Color, Fluid COLORLESS (A) YELLOW   Appearance, Fluid CLEAR CLEAR   WBC, Fluid 52 cu mm   Neutrophil Count, Fluid 4 %   Lymphs, Fluid 87 %   Monocyte-Macrophage-Serous Fluid 9 %   Eos, Fluid 0 %    Comment: Performed at [facility], [address] [place], [place] [address]  Body fluid culture     Status: None   Collection Time: 09/26/17  4:54 PM  Result Value Ref Range   Specimen Description      PERITONEAL Performed at [facility] [address] [place], [place] [address]    Special Requests      NONE Performed at [facility], [place], [place] [address]    Gram Stain      WBC PRESENT, PREDOMINANTLY MONONUCLEAR NO ORGANISMS SEEN CYTOSPIN SMEAR    Culture      NO GROWTH 3 DAYS Performed at [facility], [address] [place], [place] [address]    Report Status 09/29/2017 FINAL   Pathologist smear review     Status: None   Collection Time: 09/26/17  4:54 PM  Result Value Ref Range   Path Review      Cytospin slide of peritoneal dialysate reviewed. Cell count is low, with mostly lymphocytes and macrophages. A rare neutrophil and mesothelial cell is noted. No malignant cells are seen.    Comment: Reviewed by Lemmie Evens. Dicie Beam, MD. Performed at [facility], [address] [place], [place] [address]   Glucose, capillary     Status: Abnormal   Collection Time: 09/26/17  5:17 PM  Result Value Ref Range   Glucose-Capillary 172 (H) 70 - 99 mg/dL  Uric acid     Status: None    Collection Time: 09/26/17  7:34 PM  Result Value Ref Range   Uric Acid, Serum 7.0 3.7 - 8.6 mg/dL    Comment: Performed at [facility], [address] [place], [place] [address]  Sedimentation rate     Status: Abnormal   Collection Time: 09/26/17  7:34 PM  Result Value Ref Range   Sed Rate 127 (H) 0 - 20 mm/hr    Comment: Performed at [facility], [address],  [place], [place] [address]  C-reactive protein     Status: Abnormal   Collection Time: 09/26/17  7:34 PM  Result Value Ref Range   CRP 24.3 (H) <1.0 mg/dL    Comment: Performed at [facility] [address] [place], [place] [address]  Glucose, capillary     Status: Abnormal   Collection Time: 09/26/17 10:09 PM  Result Value Ref Range   Glucose-Capillary 203 (H) 70 - 99 mg/dL   Comment 1 Notify RN   CBC     Status: Abnormal   Collection Time: 09/27/17  4:32 AM  Result Value Ref Range   WBC 16.2 (H) 3.8 - 10.6 K/uL   RBC 3.62 (L) 4.40 - 5.90 MIL/uL   Hemoglobin 10.6 (L) 13.0 - 18.0 g/dL   HCT 30.8 (L) 40.0 - 52.0 %   MCV 85.1 80.0 - 100.0 fL   MCH 29.4 26.0 - 34.0 pg   MCHC 34.6 32.0 - 36.0 g/dL   RDW 15.1 (H) 11.5 - 14.5 %   Platelets 218 150 - 440 K/uL    Comment: Performed at [facility], [address] [place], [place] [address]  Basic metabolic panel     Status: Abnormal   Collection Time: 09/27/17  4:32 AM  Result Value Ref Range   Sodium 133 (L) 135 - 145 mmol/L   Potassium 3.6 3.5 - 5.1 mmol/L   Chloride 93 (L) 98 - 111 mmol/L   CO2 26 22 - 32 mmol/L   Glucose, Bld 238 (H) 70 - 99 mg/dL   BUN 61 (H) 8 - 23 mg/dL   Creatinine, Ser 6.00 (H) 0.61 - 1.24 mg/dL   Calcium 8.2 (L) 8.9 - 10.3 mg/dL   GFR calc non Af Amer 8 (L) >60 mL/min   GFR calc Af Amer 10 (L) >60 mL/min    Comment: (NOTE) The eGFR has been calculated using the CKD EPI equation. This calculation has not been validated in all clinical situations. eGFR's persistently <60 mL/min signify possible  Chronic Kidney Disease.    Anion gap 14 5 - 15    Comment: Performed at [facility], [address] [place], [place] [address]  Hepatitis B surface antigen     Status: None   Collection Time: 09/27/17  4:32 AM  Result Value Ref Range   Hepatitis B Surface Ag Negative Negative    Comment: (NOTE) Performed At: [facility] [place], [place] [id number] Rush Farmer MD [id number]   Glucose, capillary     Status: Abnormal   Collection Time: 09/27/17  7:38 AM  Result Value Ref Range   Glucose-Capillary 250 (H) 70 - 99 mg/dL  Glucose, capillary     Status: Abnormal   Collection Time: 09/27/17 11:44 AM  Result Value Ref Range   Glucose-Capillary 212 (H) 70 - 99 mg/dL  Glucose, capillary     Status: Abnormal   Collection Time: 09/27/17  4:17 PM  Result Value Ref Range   Glucose-Capillary 176 (H) 70 - 99 mg/dL  Synovial cell count + diff, w/ crystals     Status: Abnormal   Collection Time: 09/27/17  6:12 PM  Result Value Ref Range   Color, Synovial YELLOW (A) YELLOW   Appearance-Synovial CLOUDY (A) CLEAR   Crystals, Fluid INTRACELLULAR MONOSODIUM URATE CRYSTALS     Comment: EXTRACELLULAR MONOSODIUM URATE CRYSTALS   WBC, Synovial 15,729 (H) 0 - 200 /cu mm   Neutrophil, Synovial 89 %   Lymphocytes-Synovial Fld 2 %  Monocyte-Macrophage-Synovial Fluid 9 %   Eosinophils-Synovial 0 %   Other Cells-SYN 0     Comment: Performed at [facility], [address] [place], [place] [address]  Urine Drug Screen, Qualitative [facility] only)     Status: None   Collection Time: 09/27/17  8:25 PM  Result Value Ref Range   Tricyclic, Ur Screen NONE DETECTED NONE DETECTED   Amphetamines, Ur Screen NONE DETECTED NONE DETECTED   MDMA (Ecstasy)Ur Screen NONE DETECTED NONE DETECTED   Cocaine Metabolite,Ur Rigby NONE DETECTED NONE DETECTED   Opiate, Ur Screen NONE DETECTED NONE DETECTED   Phencyclidine (PCP) Ur S NONE DETECTED NONE DETECTED   Cannabinoid 50  Ng, Ur  NONE DETECTED NONE DETECTED   Barbiturates, Ur Screen NONE DETECTED NONE DETECTED   Benzodiazepine, Ur Scrn NONE DETECTED NONE DETECTED   Methadone Scn, Ur NONE DETECTED NONE DETECTED    Comment: (NOTE) Tricyclics + metabolites, urine    Cutoff 1000 ng/mL Amphetamines + metabolites, urine  Cutoff 1000 ng/mL MDMA (Ecstasy), urine              Cutoff 500 ng/mL Cocaine Metabolite, urine          Cutoff 300 ng/mL Opiate + metabolites, urine        Cutoff 300 ng/mL Phencyclidine (PCP), urine         Cutoff 25 ng/mL Cannabinoid, urine                 Cutoff 50 ng/mL Barbiturates + metabolites, urine  Cutoff 200 ng/mL Benzodiazepine, urine              Cutoff 200 ng/mL Methadone, urine                   Cutoff 300 ng/mL The urine drug screen provides only a preliminary, unconfirmed analytical test result and should not be used for non-medical purposes. Clinical consideration and professional judgment should be applied to any positive drug screen result due to possible interfering substances. A more specific alternate chemical method must be used in order to obtain a confirmed analytical result. Gas chromatography / mass spectrometry (GC/MS) is the preferred confirmat ory method. Performed at [facility], [address] [place], [place] [address]   Urinalysis, Routine w reflex microscopic     Status: Abnormal   Collection Time: 09/27/17  8:25 PM  Result Value Ref Range   Color, Urine YELLOW (A) YELLOW   APPearance CLEAR (A) CLEAR   Specific Gravity, Urine 1.018 1.005 - 1.030   pH 6.0 5.0 - 8.0   Glucose, UA >=500 (A) NEGATIVE mg/dL   Hgb urine dipstick SMALL (A) NEGATIVE   Bilirubin Urine NEGATIVE NEGATIVE   Ketones, ur NEGATIVE NEGATIVE mg/dL   Protein, ur 100 (A) NEGATIVE mg/dL   Nitrite NEGATIVE NEGATIVE   Leukocytes, UA MODERATE (A) NEGATIVE   RBC / HPF 0-5 0 - 5 RBC/hpf   WBC, UA >50 (H) 0 - 5 WBC/hpf   Bacteria, UA RARE (A) NONE SEEN   Squamous  Epithelial / LPF 0-5 0 - 5   Mucus PRESENT     Comment: Performed at [facility], [address] [place], [place] [address]  Glucose, capillary     Status: Abnormal   Collection Time: 09/27/17  8:35 PM  Result Value Ref Range   Glucose-Capillary 176 (H) 70 - 99 mg/dL  CBC     Status: Abnormal   Collection Time: 09/28/17  4:15 AM  Result Value  Ref Range   WBC 12.3 (H) 3.8 - 10.6 K/uL   RBC 3.55 (L) 4.40 - 5.90 MIL/uL   Hemoglobin 10.4 (L) 13.0 - 18.0 g/dL   HCT 30.5 (L) 40.0 - 52.0 %   MCV 86.1 80.0 - 100.0 fL   MCH 29.4 26.0 - 34.0 pg   MCHC 34.1 32.0 - 36.0 g/dL   RDW 15.0 (H) 11.5 - 14.5 %   Platelets 232 150 - 440 K/uL    Comment: Performed at [facility], [address] [place], [place] [address]  Basic metabolic panel     Status: Abnormal   Collection Time: 09/28/17  4:15 AM  Result Value Ref Range   Sodium 133 (L) 135 - 145 mmol/L   Potassium 3.4 (L) 3.5 - 5.1 mmol/L   Chloride 92 (L) 98 - 111 mmol/L   CO2 25 22 - 32 mmol/L   Glucose, Bld 336 (H) 70 - 99 mg/dL   BUN 54 (H) 8 - 23 mg/dL   Creatinine, Ser 5.27 (H) 0.61 - 1.24 mg/dL   Calcium 8.2 (L) 8.9 - 10.3 mg/dL   GFR calc non Af Amer 10 (L) >60 mL/min   GFR calc Af Amer 11 (L) >60 mL/min    Comment: (NOTE) The eGFR has been calculated using the CKD EPI equation. This calculation has not been validated in all clinical situations. eGFR's persistently <60 mL/min signify possible Chronic Kidney Disease.    Anion gap 16 (H) 5 - 15    Comment: Performed at [facility], [address] [place], [place] [address]  Glucose, capillary     Status: Abnormal   Collection Time: 09/28/17  7:44 AM  Result Value Ref Range   Glucose-Capillary 326 (H) 70 - 99 mg/dL  Glucose, capillary     Status: Abnormal   Collection Time: 09/28/17 11:32 AM  Result Value Ref Range   Glucose-Capillary 243 (H) 70 - 99 mg/dL  Glucose, capillary     Status: Abnormal   Collection Time: 09/28/17  1:32 PM  Result  Value Ref Range   Glucose-Capillary 226 (H) 70 - 99 mg/dL  Glucose, capillary     Status: Abnormal   Collection Time: 09/28/17  5:02 PM  Result Value Ref Range   Glucose-Capillary 260 (H) 70 - 99 mg/dL  Glucose, capillary     Status: Abnormal   Collection Time: 09/28/17  8:49 PM  Result Value Ref Range   Glucose-Capillary 298 (H) 70 - 99 mg/dL  Basic metabolic panel     Status: Abnormal   Collection Time: 09/29/17  4:59 AM  Result Value Ref Range   Sodium 132 (L) 135 - 145 mmol/L   Potassium 3.4 (L) 3.5 - 5.1 mmol/L   Chloride 92 (L) 98 - 111 mmol/L   CO2 28 22 - 32 mmol/L   Glucose, Bld 432 (H) 70 - 99 mg/dL   BUN 54 (H) 8 - 23 mg/dL   Creatinine, Ser 4.71 (H) 0.61 - 1.24 mg/dL   Calcium 8.4 (L) 8.9 - 10.3 mg/dL   GFR calc non Af Amer 11 (L) >60 mL/min   GFR calc Af Amer 13 (L) >60 mL/min    Comment: (NOTE) The eGFR has been calculated using the CKD EPI equation. This calculation has not been validated in all clinical situations. eGFR's persistently <60 mL/min signify possible Chronic Kidney Disease.    Anion gap 12 5 - 15    Comment: Performed at [facility], [address],  [place], [place] [address]  Phosphorus     Status: None   Collection Time: 09/29/17  4:59 AM  Result Value Ref Range   Phosphorus 4.5 2.5 - 4.6 mg/dL    Comment: Performed at [facility], [address] [place], [place] [address]  Hemoglobin A1c     Status: Abnormal   Collection Time: 09/29/17  5:02 AM  Result Value Ref Range   Hgb A1c MFr Bld 7.3 (H) 4.8 - 5.6 %    Comment: (NOTE) Pre diabetes:          5.7%-6.4% Diabetes:              >6.4% Glycemic control for   <7.0% adults with diabetes    Mean Plasma Glucose 162.81 mg/dL    Comment: Performed at [facility] [address] [place], [place] [address]  Glucose, capillary     Status: Abnormal   Collection Time: 09/29/17  7:25 AM  Result Value Ref Range   Glucose-Capillary 394 (H) 70 - 99 mg/dL  Glucose,  capillary     Status: Abnormal   Collection Time: 11/01/17  3:05 PM  Result Value Ref Range   Glucose-Capillary 116 (H) 70 - 99 mg/dL  CBC     Status: Abnormal   Collection Time: 11/01/17  3:42 PM  Result Value Ref Range   WBC 10.1 4.0 - 10.5 K/uL   RBC 3.64 (L) 4.22 - 5.81 MIL/uL   Hemoglobin 10.1 (L) 13.0 - 17.0 g/dL   HCT 30.2 (L) 39.0 - 52.0 %   MCV 83.0 80.0 - 100.0 fL   MCH 27.7 26.0 - 34.0 pg   MCHC 33.4 30.0 - 36.0 g/dL   RDW 15.3 11.5 - 15.5 %   Platelets 223 150 - 400 K/uL   nRBC 0.0 0.0 - 0.2 %    Comment: Performed at [facility], [address] [place], [place] [address]  Comprehensive metabolic panel     Status: Abnormal   Collection Time: 11/01/17  3:42 PM  Result Value Ref Range   Sodium 139 135 - 145 mmol/L   Potassium 3.0 (L) 3.5 - 5.1 mmol/L   Chloride 100 98 - 111 mmol/L   CO2 30 22 - 32 mmol/L   Glucose, Bld 149 (H) 70 - 99 mg/dL   BUN 15 8 - 23 mg/dL   Creatinine, Ser 1.85 (H) 0.61 - 1.24 mg/dL   Calcium 8.4 (L) 8.9 - 10.3 mg/dL   Total Protein 6.8 6.5 - 8.1 g/dL   Albumin 2.5 (L) 3.5 - 5.0 g/dL   AST 14 (L) 15 - 41 U/L   ALT 10 0 - 44 U/L   Alkaline Phosphatase 71 38 - 126 U/L   Total Bilirubin 0.7 0.3 - 1.2 mg/dL   GFR calc non Af Amer 34 (L) >60 mL/min   GFR calc Af Amer 40 (L) >60 mL/min    Comment: (NOTE) The eGFR has been calculated using the CKD EPI equation. This calculation has not been validated in all clinical situations. eGFR's persistently <60 mL/min signify possible Chronic Kidney Disease.    Anion gap 9 5 - 15    Comment: Performed at [facility], [address] [place], [place] [address]  Troponin I     Status: Abnormal   Collection Time: 11/01/17  3:42 PM  Result Value Ref Range   Troponin I 0.06 (HH) <0.03 ng/mL    Comment: CRITICAL RESULT CALLED TO, READ BACK BY AND VERIFIED  WITH CHRISTINA JONES ON 11/01/17 AT 9528 QSD Performed at [facility], [address] [place], [place] [address]    Glucose, capillary     Status: Abnormal   Collection Time: 11/01/17  8:41 PM  Result Value Ref Range   Glucose-Capillary 181 (H) 70 - 99 mg/dL  MRSA PCR Screening     Status: Abnormal   Collection Time: 11/02/17  3:06 AM  Result Value Ref Range   MRSA by PCR POSITIVE (A) NEGATIVE    Comment:        The GeneXpert MRSA Assay (FDA approved for NASAL specimens only), is one component of a comprehensive MRSA colonization surveillance program. It is not intended to diagnose MRSA infection nor to guide or monitor treatment for MRSA infections. RESULT CALLED TO, READ BACK BY AND VERIFIED WITH: C/SARA [facility] @0510  11/02/17 FLC Performed at [facility], [address] [place], [place] [address]   Basic metabolic panel     Status: Abnormal   Collection Time: 11/02/17  5:46 AM  Result Value Ref Range   Sodium 138 135 - 145 mmol/L   Potassium 2.9 (L) 3.5 - 5.1 mmol/L   Chloride 101 98 - 111 mmol/L   CO2 28 22 - 32 mmol/L   Glucose, Bld 191 (H) 70 - 99 mg/dL   BUN 21 8 - 23 mg/dL   Creatinine, Ser 2.58 (H) 0.61 - 1.24 mg/dL   Calcium 8.1 (L) 8.9 - 10.3 mg/dL   GFR calc non Af Amer 23 (L) >60 mL/min   GFR calc Af Amer 27 (L) >60 mL/min    Comment: (NOTE) The eGFR has been calculated using the CKD EPI equation. This calculation has not been validated in all clinical situations. eGFR's persistently <60 mL/min signify possible Chronic Kidney Disease.    Anion gap 9 5 - 15    Comment: Performed at [facility], [address] [place], [place] [address]  CBC     Status: Abnormal   Collection Time: 11/02/17  5:46 AM  Result Value Ref Range   WBC 8.5 4.0 - 10.5 K/uL   RBC 3.44 (L) 4.22 - 5.81 MIL/uL   Hemoglobin 9.4 (L) 13.0 - 17.0 g/dL   HCT 28.4 (L) 39.0 - 52.0 %   MCV 82.6 80.0 - 100.0 fL   MCH 27.3 26.0 - 34.0 pg   MCHC 33.1 30.0 - 36.0 g/dL   RDW 15.2 11.5 - 15.5 %   Platelets 205 150 - 400 K/uL   nRBC 0.0 0.0 - 0.2 %    Comment: Performed at [facility], [address] [place], [place] [address]  Glucose, capillary     Status: Abnormal   Collection Time: 11/02/17  8:04 AM  Result Value Ref Range   Glucose-Capillary 171 (H) 70 - 99 mg/dL  Glucose, capillary     Status: Abnormal   Collection Time: 11/02/17 11:52 AM  Result Value Ref Range   Glucose-Capillary 192 (H) 70 - 99 mg/dL  Glucose, capillary     Status: Abnormal   Collection Time: 11/02/17  6:26 PM  Result Value Ref Range   Glucose-Capillary 169 (H) 70 - 99 mg/dL  Glucose, capillary     Status: Abnormal   Collection Time: 11/02/17  8:19 PM  Result Value Ref Range   Glucose-Capillary 199 (H) 70 - 99 mg/dL  Basic metabolic panel     Status: Abnormal   Collection Time: 11/03/17  5:25 AM  Result Value Ref Range  Sodium 137 135 - 145 mmol/L   Potassium 2.9 (L) 3.5 - 5.1 mmol/L   Chloride 100 98 - 111 mmol/L   CO2 27 22 - 32 mmol/L   Glucose, Bld 190 (H) 70 - 99 mg/dL   BUN 28 (H) 8 - 23 mg/dL   Creatinine, Ser 3.48 (H) 0.61 - 1.24 mg/dL   Calcium 8.4 (L) 8.9 - 10.3 mg/dL   GFR calc non Af Amer 16 (L) >60 mL/min   GFR calc Af Amer 18 (L) >60 mL/min    Comment: (NOTE) The eGFR has been calculated using the CKD EPI equation. This calculation has not been validated in all clinical situations. eGFR's persistently <60 mL/min signify possible Chronic Kidney Disease.    Anion gap 10 5 - 15    Comment: Performed at [facility], [address] [place], [place] [address]  Glucose, capillary     Status: Abnormal   Collection Time: 11/03/17  8:13 AM  Result Value Ref Range   Glucose-Capillary 178 (H) 70 - 99 mg/dL  Glucose, capillary     Status: Abnormal   Collection Time: 11/03/17  3:17 PM  Result Value Ref Range   Glucose-Capillary 169 (H) 70 - 99 mg/dL  Glucose, capillary     Status: Abnormal   Collection Time: 11/03/17  8:47 PM  Result Value Ref Range   Glucose-Capillary 215 (H) 70 - 99 mg/dL  Glucose, capillary     Status: Abnormal   Collection  Time: 11/04/17  7:51 AM  Result Value Ref Range   Glucose-Capillary 165 (H) 70 - 99 mg/dL  Glucose, capillary     Status: Abnormal   Collection Time: 11/04/17 11:53 AM  Result Value Ref Range   Glucose-Capillary 198 (H) 70 - 99 mg/dL  Glucose, capillary     Status: Abnormal   Collection Time: 11/04/17  4:47 PM  Result Value Ref Range   Glucose-Capillary 259 (H) 70 - 99 mg/dL  Glucose, capillary     Status: Abnormal   Collection Time: 11/04/17  8:59 PM  Result Value Ref Range   Glucose-Capillary 160 (H) 70 - 99 mg/dL  Glucose, capillary     Status: Abnormal   Collection Time: 11/05/17  7:59 AM  Result Value Ref Range   Glucose-Capillary 184 (H) 70 - 99 mg/dL  Renal function panel     Status: Abnormal   Collection Time: 11/05/17 10:56 AM  Result Value Ref Range   Sodium 142 135 - 145 mmol/L   Potassium 3.4 (L) 3.5 - 5.1 mmol/L   Chloride 99 98 - 111 mmol/L   CO2 32 22 - 32 mmol/L   Glucose, Bld 176 (H) 70 - 99 mg/dL   BUN 30 (H) 8 - 23 mg/dL   Creatinine, Ser 4.60 (H) 0.61 - 1.24 mg/dL   Calcium 8.8 (L) 8.9 - 10.3 mg/dL   Phosphorus 2.7 2.5 - 4.6 mg/dL   Albumin 2.2 (L) 3.5 - 5.0 g/dL   GFR calc non Af Amer 11 (L) >60 mL/min   GFR calc Af Amer 13 (L) >60 mL/min    Comment: (NOTE) The eGFR has been calculated using the CKD EPI equation. This calculation has not been validated in all clinical situations. eGFR's persistently <60 mL/min signify possible Chronic Kidney Disease.    Anion gap 11 5 - 15    Comment: Performed at [facility], [address] [place], [place] [address]  Glucose, capillary     Status: Abnormal   Collection Time:  11/05/17  5:06 PM  Result Value Ref Range   Glucose-Capillary 134 (H) 70 - 99 mg/dL  Glucose, capillary     Status: Abnormal   Collection Time: 11/05/17  9:03 PM  Result Value Ref Range   Glucose-Capillary 171 (H) 70 - 99 mg/dL  Glucose, capillary     Status: Abnormal   Collection Time: 11/06/17  8:00 AM  Result Value Ref  Range   Glucose-Capillary 150 (H) 70 - 99 mg/dL  Glucose, capillary     Status: Abnormal   Collection Time: 11/06/17 12:07 PM  Result Value Ref Range   Glucose-Capillary 237 (H) 70 - 99 mg/dL  Glucose, capillary     Status: Abnormal   Collection Time: 11/06/17  4:30 PM  Result Value Ref Range   Glucose-Capillary 194 (H) 70 - 99 mg/dL  Glucose, capillary     Status: Abnormal   Collection Time: 11/06/17  8:53 PM  Result Value Ref Range   Glucose-Capillary 222 (H) 70 - 99 mg/dL  CBC     Status: Abnormal   Collection Time: 11/07/17  5:24 AM  Result Value Ref Range   WBC 7.1 4.0 - 10.5 K/uL   RBC 3.72 (L) 4.22 - 5.81 MIL/uL   Hemoglobin 10.0 (L) 13.0 - 17.0 g/dL   HCT 31.2 (L) 39.0 - 52.0 %   MCV 83.9 80.0 - 100.0 fL   MCH 26.9 26.0 - 34.0 pg   MCHC 32.1 30.0 - 36.0 g/dL   RDW 15.3 11.5 - 15.5 %   Platelets 289 150 - 400 K/uL   nRBC 0.0 0.0 - 0.2 %    Comment: Performed at [facility], [address] [place], [place] [address]  Glucose, capillary     Status: Abnormal   Collection Time: 11/07/17  8:01 AM  Result Value Ref Range   Glucose-Capillary 224 (H) 70 - 99 mg/dL  Glucose, capillary     Status: Abnormal   Collection Time: 11/07/17 12:03 PM  Result Value Ref Range   Glucose-Capillary 291 (H) 70 - 99 mg/dL  Glucose, capillary     Status: Abnormal   Collection Time: 11/07/17  4:24 PM  Result Value Ref Range   Glucose-Capillary 320 (H) 70 - 99 mg/dL  Pulmonary function test     Status: None   Collection Time: 12/05/17 12:00 PM  Result Value Ref Range   FEV1     FVC     FEV1/FVC     TLC     DLCO    Glucose, capillary     Status: Abnormal   Collection Time: 12/14/17 11:26 AM  Result Value Ref Range   Glucose-Capillary 114 (H) 70 - 99 mg/dL    Radiology: Nm Pet Image Initial (pi) Skull Base To Thigh  Result Date: 12/14/2017 CLINICAL DATA:  Initial treatment strategy for enlarged lymph nodes of chest and axilla. EXAM: NUCLEAR MEDICINE PET SKULL BASE TO  THIGH TECHNIQUE: 10.4 mCi F-18 FDG was injected intravenously. Full-ring PET imaging was performed from the skull base to thigh after the radiotracer. CT data was obtained and used for attenuation correction and anatomic localization. Fasting blood glucose: 114 mg/dl COMPARISON:  Chest CTs, most recent 12/12/2017. Abdominopelvic CT 06/04/2015. FINDINGS: Mediastinal blood pool activity: SUV max 2.8 NECK: No areas of abnormal hypermetabolism. Incidental CT findings: Cerebral atrophy. No cervical adenopathy. Bilateral carotid atherosclerosis. Left maxillary sinus mucosal thickening and fluid level. CHEST: A node within the subcarinal station with extension into the azygoesophageal recess is upper normal  sized and measures a S.U.V. max of 3.5 on image 99/3. This node was 1.6 cm on 11/04/2017 (when remeasured). No other hypermetabolic thoracic nodes identified. Incidental CT findings: No axillary adenopathy. Prevascular node of 10 mm, similar on the recent diagnostic CT and decreased compared to 11/04/2017. Tiny hiatal hernia. Mild cardiomegaly. Pulmonary artery enlargement, outflow tract 4.5 cm. Right Port-A-Cath tip high SVC. Left-sided thyroid enlargement and heterogeneity. Centrilobular and paraseptal emphysema. ABDOMEN/PELVIS: No abdominopelvic parenchymal or nodal hypermetabolism. Incidental CT findings: Bilateral adrenal thickening. Mild renal cortical thinning bilaterally. Abdominal aortic atherosclerosis. Dialysis catheter. Moderate prostatomegaly. SKELETON: No abnormal marrow activity. Incidental CT findings: Degenerative partial fusion of the bilateral sacroiliac joints. IMPRESSION: 1. Isolated hypermetabolic borderline mediastinal adenopathy. Given size decrease compared to 11/04/2017, a reactive etiology is favored. Similarly, other non hypermetabolic thoracic nodes have decreased in size compared to that exam, favoring a reactive etiology. 2. Incidental findings, including pulmonary artery enlargement  suggestive of arterial hypertension, sinus disease, tiny hiatal hernia, prostatomegaly. Aortic Atherosclerosis (ICD10-I70.0). Electronically Signed   By: Abigail Miyamoto M.D.   On: 12/14/2017 14:36    No results found.  Ct Chest Wo Contrast  Result Date: 12/12/2017 CLINICAL DATA:  72 year old male for follow-up of pulmonary nodules and thoracic lymphadenopathy. Cough and shortness of breath. EXAM: CT CHEST WITHOUT CONTRAST TECHNIQUE: Multidetector CT imaging of the chest was performed following the standard protocol without IV contrast. COMPARISON:  11/04/2017, 03/05/2013 and prior chest CTs. FINDINGS: Cardiovascular: Cardiomegaly and coronary artery and aortic atherosclerotic calcifications again noted. There is no evidence of thoracic aortic aneurysm. A RIGHT IJ central venous catheter is noted with tip at the SUPERIOR cavoatrial junction. No pericardial effusion identified. Mediastinum/Nodes: Mediastinal hilar lymph nodes have decreased in size with the following index nodes: A 1 cm prevascular node (series 2:54) previously 1.7 cm. A 1 cm RIGHT hilar node (2:68), previously 2.1 cm. A 1 cm LEFT hilar node (2:75), previously 1.7 cm. No new or enlarging lymph nodes are identified. Fullness of the LEFT thyroid gland again noted. Lungs/Pleura: Mild subpleural nodularity and primarily basilar interlobular septal thickening are unchanged. Mild centrilobular and paraseptal emphysema again identified. No airspace disease, consolidation, mass, pleural effusion or pneumothorax. Upper Abdomen: No acute abnormality Musculoskeletal: No acute or suspicious bony abnormality IMPRESSION: 1. Significant decrease in bilateral mediastinal and hilar lymphadenopathy compatible with a reactive/benign process. 2. Unchanged mild subpleural nodularity and mild basilar interlobular septal thickening. Sarcoid is within the differential. 3. Cardiomegaly and coronary artery disease 4.  Aortic Atherosclerosis (ICD10-I70.0). Electronically  Signed   By: Margarette Canada M.D.   On: 12/12/2017 16:46   Nm Pet Image Initial (pi) Skull Base To Thigh  Result Date: 12/14/2017 CLINICAL DATA:  Initial treatment strategy for enlarged lymph nodes of chest and axilla. EXAM: NUCLEAR MEDICINE PET SKULL BASE TO THIGH TECHNIQUE: 10.4 mCi F-18 FDG was injected intravenously. Full-ring PET imaging was performed from the skull base to thigh after the radiotracer. CT data was obtained and used for attenuation correction and anatomic localization. Fasting blood glucose: 114 mg/dl COMPARISON:  Chest CTs, most recent 12/12/2017. Abdominopelvic CT 06/04/2015. FINDINGS: Mediastinal blood pool activity: SUV max 2.8 NECK: No areas of abnormal hypermetabolism. Incidental CT findings: Cerebral atrophy. No cervical adenopathy. Bilateral carotid atherosclerosis. Left maxillary sinus mucosal thickening and fluid level. CHEST: A node within the subcarinal station with extension into the azygoesophageal recess is upper normal sized and measures a S.U.V. max of 3.5 on image 99/3. This node was 1.6 cm on 11/04/2017 (when remeasured). No  other hypermetabolic thoracic nodes identified. Incidental CT findings: No axillary adenopathy. Prevascular node of 10 mm, similar on the recent diagnostic CT and decreased compared to 11/04/2017. Tiny hiatal hernia. Mild cardiomegaly. Pulmonary artery enlargement, outflow tract 4.5 cm. Right Port-A-Cath tip high SVC. Left-sided thyroid enlargement and heterogeneity. Centrilobular and paraseptal emphysema. ABDOMEN/PELVIS: No abdominopelvic parenchymal or nodal hypermetabolism. Incidental CT findings: Bilateral adrenal thickening. Mild renal cortical thinning bilaterally. Abdominal aortic atherosclerosis. Dialysis catheter. Moderate prostatomegaly. SKELETON: No abnormal marrow activity. Incidental CT findings: Degenerative partial fusion of the bilateral sacroiliac joints. IMPRESSION: 1. Isolated hypermetabolic borderline mediastinal adenopathy. Given size  decrease compared to 11/04/2017, a reactive etiology is favored. Similarly, other non hypermetabolic thoracic nodes have decreased in size compared to that exam, favoring a reactive etiology. 2. Incidental findings, including pulmonary artery enlargement suggestive of arterial hypertension, sinus disease, tiny hiatal hernia, prostatomegaly. Aortic Atherosclerosis (ICD10-I70.0). Electronically Signed   By: Abigail Miyamoto M.D.   On: 12/14/2017 14:36      Assessment and Plan: Patient Active Problem List   Diagnosis Date Noted  . Shortness of breath 11/04/2017  . Acute metabolic encephalopathy 28/83/3744  . Sepsis ([place]) 09/26/2017  . Left arm weakness 09/25/2017  . Dyslipidemia associated with type 2 diabetes mellitus ([place]) 03/13/2016  . Diabetic ulcer of toe of right foot associated with type 2 diabetes mellitus, limited to breakdown of skin ([place]) 03/07/2016  . Microscopic hematuria 05/30/2015  . 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 Dyslipidemia 08/31/2014  . Tobacco abuse 08/31/2014  . Diabetes mellitus with renal manifestation ([place]) 08/31/2014  . COPD (chronic obstructive pulmonary disease) ([place]) 08/31/2014  . Microalbuminuria 08/31/2014  . ED (erectile dysfunction) 08/31/2014  . Hypertension   . Dyspnea 10/09/2011   1. Sarcoidosis Stable, continue current management.  2. Lymphadenopathy, mediastinal Appears to be lessening on most recent chest CT and PET scan.  Likely the inflammatory process we will continue to monitor.  3. Chronic obstructive pulmonary disease, unspecified COPD type (HCC) Stable, severe disease.  Continue to use patient's inhalers and medications as prescribed.  4. ESRD (end stage renal disease) on dialysis ([place]) Stable continue to follow-up with nephrology.   General Counseling: I have discussed the findings of the evaluation and examination with Joseph Newman.  I have also discussed any further diagnostic evaluation  thatmay be needed or ordered today. Joseph Newman verbalizes understanding of the findings of todays visit. We also reviewed his medications today and discussed drug interactions and side effects including but not limited excessive drowsiness and altered mental states. We also discussed that there is always a risk not just to him but also people around him. he has been encouraged to call the office with any questions or concerns that should arise related to todays visit.    Time spent: 30 This patient was seen by Orson Gear AGNP-C in Collaboration with Dr. Devona Konig as a part of collaborative care agreement.   I have personally obtained a history, examined the patient, evaluated laboratory and imaging results, formulated the assessment and plan and placed orders.    Allyne Gee, MD [facility]monary and Critical [facility] Sleep medicine

## 2017-12-30 ENCOUNTER — Emergency Department
Admission: EM | Admit: 2017-12-30 | Discharge: 2017-12-30 | Disposition: A | Discharge status: Home / Self Care | Payer: Medicare Other | Attending: Emergency Medicine | Admitting: Emergency Medicine

## 2017-12-30 ENCOUNTER — Other Ambulatory Visit: Payer: Self-pay

## 2017-12-30 ENCOUNTER — Encounter: Payer: Self-pay | Admitting: Emergency Medicine

## 2017-12-30 DIAGNOSIS — Z794 Long term (current) use of insulin: Secondary | ICD-10-CM | POA: Insufficient documentation

## 2017-12-30 DIAGNOSIS — E1122 Type 2 diabetes mellitus with diabetic chronic kidney disease: Secondary | ICD-10-CM | POA: Diagnosis not present

## 2017-12-30 DIAGNOSIS — Z79899 Other long term (current) drug therapy: Secondary | ICD-10-CM | POA: Insufficient documentation

## 2017-12-30 DIAGNOSIS — I120 Hypertensive chronic kidney disease with stage 5 chronic kidney disease or end stage renal disease: Secondary | ICD-10-CM | POA: Insufficient documentation

## 2017-12-30 DIAGNOSIS — R1110 Vomiting, unspecified: Secondary | ICD-10-CM | POA: Diagnosis present

## 2017-12-30 DIAGNOSIS — Z87891 Personal history of nicotine dependence: Secondary | ICD-10-CM | POA: Diagnosis not present

## 2017-12-30 DIAGNOSIS — Z7982 Long term (current) use of aspirin: Secondary | ICD-10-CM | POA: Diagnosis not present

## 2017-12-30 DIAGNOSIS — Z992 Dependence on renal dialysis: Secondary | ICD-10-CM | POA: Diagnosis not present

## 2017-12-30 DIAGNOSIS — J449 Chronic obstructive pulmonary disease, unspecified: Secondary | ICD-10-CM | POA: Insufficient documentation

## 2017-12-30 DIAGNOSIS — N390 Urinary tract infection, site not specified: Secondary | ICD-10-CM

## 2017-12-30 DIAGNOSIS — N186 End stage renal disease: Secondary | ICD-10-CM | POA: Diagnosis not present

## 2017-12-30 DIAGNOSIS — R112 Nausea with vomiting, unspecified: Secondary | ICD-10-CM | POA: Diagnosis not present

## 2017-12-30 LAB — COMPREHENSIVE METABOLIC PANEL
ALBUMIN: 3 g/dL — AB (ref 3.5–5.0)
ALT: 10 U/L (ref 0–44)
ANION GAP: 9 (ref 5–15)
AST: 13 U/L — ABNORMAL LOW (ref 15–41)
Alkaline Phosphatase: 75 U/L (ref 38–126)
BUN: 14 mg/dL (ref 8–23)
CO2: 30 mmol/L (ref 22–32)
Calcium: 9.2 mg/dL (ref 8.9–10.3)
Chloride: 96 mmol/L — ABNORMAL LOW (ref 98–111)
Creatinine, Ser: 2.4 mg/dL — ABNORMAL HIGH (ref 0.61–1.24)
GFR calc Af Amer: 30 mL/min — ABNORMAL LOW (ref >60)
GFR calc non Af Amer: 26 mL/min — ABNORMAL LOW (ref >60)
GLUCOSE: 120 mg/dL — AB (ref 70–99)
POTASSIUM: 3.6 mmol/L (ref 3.5–5.1)
SODIUM: 135 mmol/L (ref 135–145)
Total Bilirubin: 0.8 mg/dL (ref 0.3–1.2)
Total Protein: 6.8 g/dL (ref 6.5–8.1)

## 2017-12-30 LAB — CBC
HCT: 37.8 % — ABNORMAL LOW (ref 39.0–52.0)
HEMOGLOBIN: 12.4 g/dL — AB (ref 13.0–17.0)
MCH: 27 pg (ref 26.0–34.0)
MCHC: 32.8 g/dL (ref 30.0–36.0)
MCV: 82.2 fL (ref 80.0–100.0)
NRBC: 0 % (ref 0.0–0.2)
Platelets: 276 10*3/uL (ref 150–400)
RBC: 4.6 MIL/uL (ref 4.22–5.81)
RDW: 16.3 % — ABNORMAL HIGH (ref 11.5–15.5)
WBC: 9.7 10*3/uL (ref 4.0–10.5)

## 2017-12-30 LAB — URINALYSIS, ROUTINE W REFLEX MICROSCOPIC
Bilirubin Urine: NEGATIVE
GLUCOSE, UA: NEGATIVE mg/dL
KETONES UR: NEGATIVE mg/dL
Nitrite: NEGATIVE
PH: 8 (ref 5.0–8.0)
PROTEIN: 100 mg/dL — AB
Specific Gravity, Urine: 1.009 (ref 1.005–1.030)
WBC, UA: >50 WBC/hpf — ABNORMAL HIGH (ref 0–5)

## 2017-12-30 LAB — TYPE AND SCREEN
ABO/RH(D): O POS
Antibody Screen: NEGATIVE

## 2017-12-30 MED ORDER — SODIUM CHLORIDE 0.9 % IV SOLN
1.0000 g | Freq: Once | INTRAVENOUS | Status: AC
  Administered 2017-12-30: 1 g via INTRAVENOUS
  Filled 2017-12-30: qty 10

## 2017-12-30 MED ORDER — ONDANSETRON 4 MG PO TBDP: 4.0000 mg | ORAL_TABLET | Freq: Three times a day (TID) | ORAL | 0 refills | Status: DC | PRN

## 2017-12-30 MED ORDER — SODIUM CHLORIDE 0.9 % IV BOLUS
500.0000 mL | Freq: Once | INTRAVENOUS | Status: AC
  Administered 2017-12-30: 500 mL via INTRAVENOUS

## 2017-12-30 MED ORDER — ONDANSETRON HCL 4 MG/2ML IJ SOLN
4.0000 mg | Freq: Once | INTRAMUSCULAR | Status: AC
  Administered 2017-12-30: 4 mg via INTRAVENOUS
  Filled 2017-12-30: qty 2

## 2017-12-30 MED ORDER — CEPHALEXIN 500 MG PO CAPS: 500.0000 mg | ORAL_CAPSULE | Freq: Every day | ORAL | 0 refills | Status: AC

## 2017-12-30 NOTE — ED Notes (Signed)
Pt's wife and sons are present to get pt dressed and to help take him home.

## 2017-12-30 NOTE — ED Triage Notes (Signed)
Pt to ED via EMS from home with c/o nausea and vomiting x2days . Pt receives dialysis T-T-Sat, Pt did not recife full treatment yesterday due to emesis. PT a&OX4, VSS

## 2017-12-30 NOTE — ED Provider Notes (Signed)
[facility] Emergency Department Provider Note  Time seen: 11:47 AM  I have reviewed the triage vital signs and the nursing notes.   HISTORY  Chief Complaint Emesis    HPI Joseph Newman is a 72 y.o. male with a past medical history of end-stage renal disease on hemodialysis Tuesday/Thursday/Saturday, hypertension, hyperlipidemia, presents to the emergency department for nausea and vomiting.  According to the patient for the past 2 days he has been very nauseated with frequent episodes of vomiting.  Denies any diarrhea or dysuria.  No fever, no abdominal pain chest pain or trouble breathing.  Patient did go to dialysis yesterday but his session was cut short due to the nausea and vomiting.  Largely negative review of systems.  Patient does have vomitus on his shirt, but otherwise appears well currently.   Past Medical History:  Diagnosis Date  . Acute metabolic encephalopathy 31/54/0086   Per my chart  . Arthritis   . Chronic airway obstruction, not elsewhere classified   . 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 Holley Raring, nephrologist  . Depressive disorder, not elsewhere classified   . Heart murmur   . Hyperlipidemia   . Hypertension   . Hypertrophy of prostate without urinary obstruction and other lower urinary tract symptoms (LUTS)   . Impotence of organic origin   . Microalbuminuria   . 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 Obesity, unspecified   . Plantar wart   . Sleep apnea   . Synovitis and tenosynovitis, unspecified   . Type II or unspecified type diabetes mellitus without mention of complication, uncontrolled   . Unspecified venous (peripheral) insufficiency     Patient Active Problem List   Diagnosis Date Noted  . Shortness of breath 11/04/2017  . Acute metabolic encephalopathy 76/19/5093  . Sepsis ([place]) 09/26/2017  . Left arm weakness 09/25/2017  . Dyslipidemia associated with type 2 diabetes  mellitus ([place]) 03/13/2016  . Diabetic ulcer of toe of right foot associated with type 2 diabetes mellitus, limited to breakdown of skin ([place]) 03/07/2016  . Microscopic hematuria 05/30/2015  . 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 Dyslipidemia 08/31/2014  . Tobacco abuse 08/31/2014  . Diabetes mellitus with renal manifestation ([place]) 08/31/2014  . COPD (chronic obstructive pulmonary disease) ([place]) 08/31/2014  . Microalbuminuria 08/31/2014  . ED (erectile dysfunction) 08/31/2014  . Hypertension   . Dyspnea 10/09/2011    Past Surgical History:  Procedure Laterality Date  . DIALYSIS/PERMA CATHETER INSERTION N/A 09/28/2017   Procedure: DIALYSIS/PERMA CATHETER INSERTION;  Surgeon: Katha Cabal, MD;  Location: [place] [facility];  Service: Cardiovascular;  Laterality: N/A;  . foot surgery      Prior to Admission medications   Medication Sig Start Date End Date Taking? Authorizing Provider  acetaminophen (TYLENOL) 325 MG tablet Take 2 tablets (650 mg total) by mouth every 6 (six) hours as needed for mild pain (or Fever >/= 101). 11/07/17   Loletha Grayer, MD  albuterol (PROVENTIL) (2.5 MG/3ML) 0.083% nebulizer solution Take 3 mLs (2.5 mg total) by nebulization every 6 (six) hours. 11/07/17   Loletha Grayer, MD  allopurinol (ZYLOPRIM) 100 MG tablet Take 0.5 tablets (50 mg total) by mouth every other day. 09/30/17   Gladstone Lighter, MD  amLODipine (NORVASC) 5 MG tablet Take 1 tablet (5 mg total) by mouth daily. 11/08/17   Loletha Grayer, MD  aspirin EC 81 MG tablet Take 81 mg by mouth daily.  [provider]  Blood Glucose Monitoring Suppl (ONE TOUCH ULTRA SYSTEM KIT) W/DEVICE KIT 1 kit by Does not apply route once.    [provider]  calcitRIOL (ROCALTROL) 0.25 MCG capsule Take 0.25 mcg by mouth daily.  04/02/17   [provider]  carvedilol (COREG) 6.25 MG tablet Take 6.25 mg by mouth 2 (two) times daily with  a meal.  04/05/15   Holley Raring, [place], MD  Cholecalciferol (VITAMIN D) 2000 UNITS CAPS Take 1 capsule by mouth daily.     [provider]  citalopram (CELEXA) 10 MG tablet Take 10 mg by mouth daily. 09/06/17   [provider]  Cyanocobalamin (VITAMIN B-12) 1000 MCG SUBL Take 1 tablet by mouth daily.    [provider]  fluticasone (FLONASE) 50 MCG/ACT nasal spray Place 2 sprays into both nostrils daily. 11/08/17   Loletha Grayer, MD  insulin aspart (NOVOLOG) 100 UNIT/ML injection 3 units with meals if eating 11/07/17   Loletha Grayer, MD  insulin detemir (LEVEMIR) 100 UNIT/ML injection Inject 0.05 mLs (5 Units total) into the skin at bedtime. 11/07/17   Loletha Grayer, MD  irbesartan (AVAPRO) 300 MG tablet Take 1 tablet (300 mg total) by mouth daily. 11/08/17   Loletha Grayer, MD  multivitamin (RENA-VIT) TABS tablet Take 1 tablet by mouth at bedtime. 11/07/17   Loletha Grayer, MD  Nutritional Supplements (FEEDING SUPPLEMENT, NEPRO CARB STEADY,) LIQD Take 237 mLs by mouth 3 (three) times daily between meals. 11/07/17   Loletha Grayer, MD  oxyCODONE-acetaminophen (PERCOCET/ROXICET) 5-325 MG tablet Take 1 tablet by mouth every 8 (eight) hours as needed for moderate pain or severe pain. 11/07/17   Loletha Grayer, MD  OXYGEN Inhale into the lungs. 2. liters    [provider]  predniSONE (DELTASONE) 10 MG tablet 4 tabs p.o. daily for 3 days, 3-1/2 tab daily for 3 days, 3 tablets daily for 3 days, 2-1/2 tablets for 3 days, 2 tablets daily for 3 days and continued until seeing pulmonology. 11/07/17   Loletha Grayer, MD  tamsulosin (FLOMAX) 0.4 MG CAPS capsule Take 0.4 mg by mouth daily after breakfast.     [provider]    No Known Allergies  Family History  Problem Relation Age of Onset  . Hypertension Mother   . Kidney disease Neg Hx   . Prostate cancer Neg Hx     Social History Social History   Tobacco Use  . Smoking status: Former  Smoker    Packs/day: 0.25    Years: 30.00    Pack years: 7.50    Types: Cigarettes, Cigars    Last attempt to quit: 07/23/2017    Years since quitting: 0.4  . Smokeless tobacco: Never Used  . Tobacco comment: he is cutting down, used to smoke one pack day   Substance Use Topics  . Alcohol use: Yes    Alcohol/week: 0.0 standard drinks    Comment: occasional  . Drug use: No    Review of Systems Constitutional: Negative for fever. Cardiovascular: Negative for chest pain. Respiratory: Negative for shortness of breath. Gastrointestinal: Negative for abdominal pain.  Positive for nausea vomiting x2 days.  Negative for diarrhea. Genitourinary: Still makes urine each day but denies dysuria. Musculoskeletal: Negative for musculoskeletal complaints Skin: Negative for skin complaints  Neurological: Negative for headache All other ROS negative  ____________________________________________   PHYSICAL EXAM:  VITAL SIGNS: ED Triage Vitals  Enc Vitals Group     BP --      Pulse  Rate 12/30/17 1140 83     Resp 12/30/17 1140 16     Temp 12/30/17 1140 98.5 F (36.9 C)     Temp Source 12/30/17 1140 Oral     SpO2 12/30/17 1140 96 %     Weight --      Height --      Head Circumference --      Peak Flow --      Pain Score 12/30/17 1141 0     Pain Loc --      Pain Edu? --      Excl. in [place]? --    Constitutional: Alert and oriented. Well appearing and in no distress. Eyes: Normal exam ENT   Head: Normocephalic and atraumatic   Mouth/Throat: Mucous membranes are moist. Cardiovascular: Normal rate, regular rhythm. No murmur Respiratory: Normal respiratory effort without tachypnea nor retractions. Breath sounds are clear Gastrointestinal: Soft and nontender. No distention.   Musculoskeletal: Nontender with normal range of motion in all extremities. Neurologic:  Normal speech and language. No gross focal neurologic deficits Skin:  Skin is warm, dry and intact.  Psychiatric: Mood  and affect are normal. ____________________________________________    EKG  EKG reviewed and interpreted by myself shows normal sinus rhythm 88 bpm with a narrow QRS, normal axis, normal intervals, no concerning ST changes.  ____________________________________________   INITIAL IMPRESSION / ASSESSMENT AND PLAN / ED COURSE  Pertinent labs & imaging results that were available during my care of the patient were reviewed by me and considered in my medical decision making (see chart for details).  Patient presents to the emergency department for nausea vomiting x2 days.  Differential would include metabolic abnormality, dehydration, gastroenteritis, GI bleed, uremia.  We will check labs, IV hydrate with 500 cc of normal saline, treat with Zofran and continue to closely monitor.  Patient agreeable to plan of care.  Patient's urinalysis is consistent with a urinary tract infection.  The rest of the patient's lab work is largely at baseline.  We will dose IV Rocephin and discharged with Keflex to be taken 500 mg daily dosed after dialysis, on days of dialysis.  Patient will follow-up with his nephrologist this week.  Wife agreeable to plan of care.  I discussed return precautions.  ____________________________________________   FINAL CLINICAL IMPRESSION(S) / ED DIAGNOSES  Nausea and vomiting UTI   Harvest Dark, MD 12/30/17 1420

## 2017-12-31 ENCOUNTER — Ambulatory Visit (INDEPENDENT_AMBULATORY_CARE_PROVIDER_SITE_OTHER): Payer: Medicare Other | Admitting: Vascular Surgery

## 2017-12-31 ENCOUNTER — Other Ambulatory Visit (INDEPENDENT_AMBULATORY_CARE_PROVIDER_SITE_OTHER): Payer: Self-pay | Admitting: Vascular Surgery

## 2017-12-31 ENCOUNTER — Encounter (INDEPENDENT_AMBULATORY_CARE_PROVIDER_SITE_OTHER): Payer: Self-pay | Admitting: Vascular Surgery

## 2017-12-31 ENCOUNTER — Ambulatory Visit (INDEPENDENT_AMBULATORY_CARE_PROVIDER_SITE_OTHER): Payer: Medicare Other

## 2017-12-31 VITALS — BP 125/76 | HR 90 | Resp 19 | Ht 77.0 in | Wt 195.0 lb

## 2017-12-31 DIAGNOSIS — I120 Hypertensive chronic kidney disease with stage 5 chronic kidney disease or end stage renal disease: Secondary | ICD-10-CM

## 2017-12-31 DIAGNOSIS — T829XXA Unspecified complication of cardiac and vascular prosthetic device, implant and graft, initial encounter: Secondary | ICD-10-CM

## 2017-12-31 DIAGNOSIS — I10 Essential (primary) hypertension: Secondary | ICD-10-CM

## 2017-12-31 DIAGNOSIS — N186 End stage renal disease: Secondary | ICD-10-CM

## 2017-12-31 DIAGNOSIS — E1122 Type 2 diabetes mellitus with diabetic chronic kidney disease: Secondary | ICD-10-CM

## 2017-12-31 DIAGNOSIS — R809 Proteinuria, unspecified: Secondary | ICD-10-CM

## 2017-12-31 DIAGNOSIS — E785 Hyperlipidemia, unspecified: Secondary | ICD-10-CM

## 2017-12-31 DIAGNOSIS — E1129 Type 2 diabetes mellitus with other diabetic kidney complication: Secondary | ICD-10-CM

## 2017-12-31 DIAGNOSIS — F17211 Nicotine dependence, cigarettes, in remission: Secondary | ICD-10-CM

## 2017-12-31 DIAGNOSIS — J410 Simple chronic bronchitis: Secondary | ICD-10-CM

## 2017-12-31 DIAGNOSIS — T829XXS Unspecified complication of cardiac and vascular prosthetic device, implant and graft, sequela: Secondary | ICD-10-CM

## 2017-12-31 NOTE — Progress Notes (Signed)
MRN : [id number]  Quintel Mccalla is a 72 y.o. (01-Oct-1945) male who presents with chief complaint of  Chief Complaint  Patient presents with  . New Patient (Initial Visit)    Vein mapping consult/PD cath removal  .  History of Present Illness:    The patient is seen for evaluation of dialysis access.    He was doing PD for a while but the catheter has not been working and he is now transitioned to hemodialysis.  Current access is via a catheter which is functioning poorly.  There have been several episodes of catheter infection.  The patient denies amaurosis fugax or recent TIA symptoms. There are no recent neurological changes noted. The patient denies claudication symptoms or rest pain symptoms. The patient denies history of DVT, PE or superficial thrombophlebitis. The patient denies recent episodes of angina or shortness of breath.   Vein mapping shows a 4 mm cephalic vein at the wrist bilaterally.  No perforators are noted so he is not a candidate for a WaveLinQ  Current Meds  Medication Sig  . albuterol (PROVENTIL) (2.5 MG/3ML) 0.083% nebulizer solution Take 3 mLs (2.5 mg total) by nebulization every 6 (six) hours.  Marland Kitchen allopurinol (ZYLOPRIM) 100 MG tablet Take 0.5 tablets (50 mg total) by mouth every other day.  Marland Kitchen amLODipine (NORVASC) 5 MG tablet Take 1 tablet (5 mg total) by mouth daily.  Marland Kitchen amLODIPine-Valsartan-HCTZ 10-320-25 MG TABS Take by mouth.  Marland Kitchen aspirin EC 81 MG tablet Take 81 mg by mouth daily.  . calcitRIOL (ROCALTROL) 0.25 MCG capsule Take 0.25 mcg by mouth daily.   . carvedilol (COREG) 6.25 MG tablet Take 6.25 mg by mouth 2 (two) times daily with a meal.   . cephALEXin (KEFLEX) 500 MG capsule Take 1 capsule (500 mg total) by mouth daily for 7 days.  . Cholecalciferol (VITAMIN D) 2000 UNITS CAPS Take 1 capsule by mouth daily.   . citalopram (CELEXA) 10 MG tablet Take 10 mg by mouth daily.  . Cyanocobalamin (VITAMIN B-12) 1000 MCG SUBL Take 1 tablet by mouth daily.  .  fluticasone (FLONASE) 50 MCG/ACT nasal spray Place 2 sprays into both nostrils daily.  . irbesartan (AVAPRO) 300 MG tablet Take 1 tablet (300 mg total) by mouth daily.  Marland Kitchen losartan (COZAAR) 100 MG tablet GIVE 1 TABLET BY MOUTH ONE TIME A DAY  . multivitamin (RENA-VIT) TABS tablet Take 1 tablet by mouth at bedtime.  . Nutritional Supplements (FEEDING SUPPLEMENT, NEPRO CARB STEADY,) LIQD Take 237 mLs by mouth 3 (three) times daily between meals.  . ondansetron (ZOFRAN ODT) 4 MG disintegrating tablet Take 1 tablet (4 mg total) by mouth every 8 (eight) hours as needed for nausea or vomiting.  Marland Kitchen oxyCODONE-acetaminophen (PERCOCET/ROXICET) 5-325 MG tablet Take 1 tablet by mouth every 8 (eight) hours as needed for moderate pain or severe pain.  . OXYGEN Inhale into the lungs. 2. liters  . tamsulosin (FLOMAX) 0.4 MG CAPS capsule Take 0.4 mg by mouth daily after breakfast.   . TRULICITY 1.5 QP/6.1PJ SOPN   . [DISCONTINUED] HUMALOG 100 UNIT/ML injection INJECT 3 UNITS SUBCUTANEOUSLY WITH MEALS  . [DISCONTINUED] insulin aspart (NOVOLOG) 100 UNIT/ML injection 3 units with meals if eating  . [DISCONTINUED] insulin detemir (LEVEMIR) 100 UNIT/ML injection Inject 0.05 mLs (5 Units total) into the skin at bedtime.  . [DISCONTINUED] predniSONE (DELTASONE) 10 MG tablet 4 tabs p.o. daily for 3 days, 3-1/2 tab daily for 3 days, 3 tablets daily for 3 days, 2-1/2 tablets  for 3 days, 2 tablets daily for 3 days and continued until seeing pulmonology.    Past Medical History:  Diagnosis Date  . Acute metabolic encephalopathy 40/98/1191   Per my chart  . Arthritis   . Chronic airway obstruction, not elsewhere classified   . 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 Holley Raring, nephrologist  . Depressive disorder, not elsewhere classified   . Heart murmur   . Hyperlipidemia   . Hypertension   . Hypertrophy of prostate without urinary obstruction and other lower urinary tract symptoms (LUTS)     . Impotence of organic origin   . Microalbuminuria   . 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 Obesity, unspecified   . Plantar wart   . Sleep apnea   . Synovitis and tenosynovitis, unspecified   . Type II or unspecified type diabetes mellitus without mention of complication, uncontrolled   . Unspecified venous (peripheral) insufficiency     Past Surgical History:  Procedure Laterality Date  . DIALYSIS/PERMA CATHETER INSERTION N/A 09/28/2017   Procedure: DIALYSIS/PERMA CATHETER INSERTION;  Surgeon: Katha Cabal, MD;  Location: [place] [facility];  Service: Cardiovascular;  Laterality: N/A;  . foot surgery      Social History Social History   Tobacco Use  . Smoking status: Former Smoker    Packs/day: 0.25    Years: 30.00    Pack years: 7.50    Types: Cigarettes, Cigars    Last attempt to quit: 07/23/2017    Years since quitting: 0.4  . Smokeless tobacco: Never Used  . Tobacco comment: he is cutting down, used to smoke one pack day   Substance Use Topics  . Alcohol use: Yes    Alcohol/week: 0.0 standard drinks    Comment: occasional  . Drug use: No    Family History Family History  Problem Relation Age of Onset  . Hypertension Mother   . Kidney disease Neg Hx   . Prostate cancer Neg Hx   No family history of bleeding/clotting disorders, porphyria or autoimmune disease   No Known Allergies   REVIEW OF SYSTEMS (Negative unless checked)  Constitutional: [] Weight loss  [] Fever  [] Chills Cardiac: [] Chest pain   [] Chest pressure   [] Palpitations   [] Shortness of breath when laying flat   [x] Shortness of breath with exertion. Vascular:  [] Pain in legs with walking   [] Pain in legs at rest  [] History of DVT   [] Phlebitis   [] Swelling in legs   [] Varicose veins   [] Non-healing ulcers Pulmonary:   [] Uses home oxygen   [] Productive cough   [] Hemoptysis   [] Wheeze  [x] COPD   [] Asthma Neurologic:  [] Dizziness   [] Seizures   [] History of stroke   [] History of  TIA  [] Aphasia   [] Vissual changes   [] Weakness or numbness in arm   [] Weakness or numbness in leg Musculoskeletal:   [] Joint swelling   [x] Joint pain   [x] Low back pain Hematologic:  [] Easy bruising  [] Easy bleeding   [] Hypercoagulable state   [] Anemic Gastrointestinal:  [] Diarrhea   [] Vomiting  [] Gastroesophageal reflux/heartburn   [] Difficulty swallowing. Genitourinary:  [x] Chronic kidney disease   [] Difficult urination  [] Frequent urination   [] Blood in urine Skin:  [] Rashes   [] Ulcers  Psychological:  [] History of anxiety   []  History of major depression.  Physical Examination  Vitals:   12/31/17 1428  BP: 125/76  Pulse: 90  Resp: 19  Weight: 195 lb (88.5 kg)  Height: 6\' 5"  (  1.956 m)   Body mass index is 23.12 kg/m. Gen: WD/WN, NAD Head: [place]/AT, No temporalis wasting.  Ear/Nose/Throat: Hearing grossly intact, nares w/o erythema or drainage, poor dentition Eyes: PER, EOMI, sclera nonicteric.  Neck: Supple, no masses.  No bruit or JVD.  Pulmonary:  Good air movement, clear to auscultation bilaterally, no use of accessory muscles.  Cardiac: RRR, normal S1, S2, no Murmurs. Vascular: right IJ tunneled catheter nontender  PD cath in place not infected Vessel Right Left  Radial Palpable Palpable  Ulnar Palpable Palpable  Brachial Palpable Palpable  Gastrointestinal: soft, non-distended. No guarding/no peritoneal signs.  Musculoskeletal: M/S 5/5 throughout.  No deformity or atrophy.  Neurologic: CN 2-12 intact. Pain and light touch intact in extremities.  Symmetrical.  Speech is fluent. Motor exam as listed above. Psychiatric: Judgment intact, Mood & affect appropriate for pt's clinical situation. Dermatologic: No rashes or ulcers noted.  No changes consistent with cellulitis. Lymph : No Cervical lymphadenopathy, no lichenification or skin changes of chronic lymphedema.  CBC Lab Results  Component Value Date   WBC 9.7 12/30/2017   HGB 12.4 (L) 12/30/2017   HCT 37.8 (L)  12/30/2017   MCV 82.2 12/30/2017   PLT 276 12/30/2017    BMET    Component Value Date/Time   NA 135 12/30/2017 1148   NA 142 03/17/2016   NA 134 (L) 07/10/2013 1410   K 3.6 12/30/2017 1148   K 5.2 (H) 07/10/2013 1410   CL 96 (L) 12/30/2017 1148   CL 104 07/10/2013 1410   CO2 30 12/30/2017 1148   CO2 25 07/10/2013 1410   GLUCOSE 120 (H) 12/30/2017 1148   GLUCOSE 372 (H) 07/10/2013 1410   BUN 14 12/30/2017 1148   BUN 29 (A) 03/17/2016   BUN 47 (H) 07/10/2013 1410   CREATININE 2.40 (H) 12/30/2017 1148   CREATININE 2.06 (H) 03/13/2016 1122   CALCIUM 9.2 12/30/2017 1148   CALCIUM 8.7 07/10/2013 1410   GFRNONAA 26 (L) 12/30/2017 1148   GFRNONAA 31 (L) 03/13/2016 1122   GFRAA 30 (L) 12/30/2017 1148   GFRAA 36 (L) 03/13/2016 1122   Estimated Creatinine Clearance: 33.8 mL/min (A) (by C-G formula based on SCr of 2.4 mg/dL (H)).  COAG Lab Results  Component Value Date   INR 1.12 09/25/2017    Radiology Ct Chest Wo Contrast  Result Date: 12/12/2017 CLINICAL DATA:  72 year old male for follow-up of pulmonary nodules and thoracic lymphadenopathy. Cough and shortness of breath. EXAM: CT CHEST WITHOUT CONTRAST TECHNIQUE: Multidetector CT imaging of the chest was performed following the standard protocol without IV contrast. COMPARISON:  11/04/2017, 03/05/2013 and prior chest CTs. FINDINGS: Cardiovascular: Cardiomegaly and coronary artery and aortic atherosclerotic calcifications again noted. There is no evidence of thoracic aortic aneurysm. A RIGHT IJ central venous catheter is noted with tip at the SUPERIOR cavoatrial junction. No pericardial effusion identified. Mediastinum/Nodes: Mediastinal hilar lymph nodes have decreased in size with the following index nodes: A 1 cm prevascular node (series 2:54) previously 1.7 cm. A 1 cm RIGHT hilar node (2:68), previously 2.1 cm. A 1 cm LEFT hilar node (2:75), previously 1.7 cm. No new or enlarging lymph nodes are identified. Fullness of the LEFT  thyroid gland again noted. Lungs/Pleura: Mild subpleural nodularity and primarily basilar interlobular septal thickening are unchanged. Mild centrilobular and paraseptal emphysema again identified. No airspace disease, consolidation, mass, pleural effusion or pneumothorax. Upper Abdomen: No acute abnormality Musculoskeletal: No acute or suspicious bony abnormality IMPRESSION: 1. Significant decrease in bilateral mediastinal and  hilar lymphadenopathy compatible with a reactive/benign process. 2. Unchanged mild subpleural nodularity and mild basilar interlobular septal thickening. Sarcoid is within the differential. 3. Cardiomegaly and coronary artery disease 4.  Aortic Atherosclerosis (ICD10-I70.0). Electronically Signed   By: Margarette Canada M.D.   On: 12/12/2017 16:46   Nm Pet Image Initial (pi) Skull Base To Thigh  Result Date: 12/14/2017 CLINICAL DATA:  Initial treatment strategy for enlarged lymph nodes of chest and axilla. EXAM: NUCLEAR MEDICINE PET SKULL BASE TO THIGH TECHNIQUE: 10.4 mCi F-18 FDG was injected intravenously. Full-ring PET imaging was performed from the skull base to thigh after the radiotracer. CT data was obtained and used for attenuation correction and anatomic localization. Fasting blood glucose: 114 mg/dl COMPARISON:  Chest CTs, most recent 12/12/2017. Abdominopelvic CT 06/04/2015. FINDINGS: Mediastinal blood pool activity: SUV max 2.8 NECK: No areas of abnormal hypermetabolism. Incidental CT findings: Cerebral atrophy. No cervical adenopathy. Bilateral carotid atherosclerosis. Left maxillary sinus mucosal thickening and fluid level. CHEST: A node within the subcarinal station with extension into the azygoesophageal recess is upper normal sized and measures a S.U.V. max of 3.5 on image 99/3. This node was 1.6 cm on 11/04/2017 (when remeasured). No other hypermetabolic thoracic nodes identified. Incidental CT findings: No axillary adenopathy. Prevascular node of 10 mm, similar on the recent  diagnostic CT and decreased compared to 11/04/2017. Tiny hiatal hernia. Mild cardiomegaly. Pulmonary artery enlargement, outflow tract 4.5 cm. Right Port-A-Cath tip high SVC. Left-sided thyroid enlargement and heterogeneity. Centrilobular and paraseptal emphysema. ABDOMEN/PELVIS: No abdominopelvic parenchymal or nodal hypermetabolism. Incidental CT findings: Bilateral adrenal thickening. Mild renal cortical thinning bilaterally. Abdominal aortic atherosclerosis. Dialysis catheter. Moderate prostatomegaly. SKELETON: No abnormal marrow activity. Incidental CT findings: Degenerative partial fusion of the bilateral sacroiliac joints. IMPRESSION: 1. Isolated hypermetabolic borderline mediastinal adenopathy. Given size decrease compared to 11/04/2017, a reactive etiology is favored. Similarly, other non hypermetabolic thoracic nodes have decreased in size compared to that exam, favoring a reactive etiology. 2. Incidental findings, including pulmonary artery enlargement suggestive of arterial hypertension, sinus disease, tiny hiatal hernia, prostatomegaly. Aortic Atherosclerosis (ICD10-I70.0). Electronically Signed   By: Abigail Miyamoto M.D.   On: 12/14/2017 14:36     Assessment/Plan 1. End stage renal disease ([place]) Recommend:  At this time the patient does not have appropriate extremity access for dialysis  Patient should have a right radiocephalic fistula created.  This is because he had a severe gouty flare of the left  The risks, benefits and alternative therapies were reviewed in detail with the patient.  All questions were answered.  The patient agrees to proceed with surgery.    2. Complication from renal dialysis device, sequela I will plan to remove the PD catheter at the time of surgery  3. Essential hypertension Continue antihypertensive medications as already ordered, these medications have been reviewed and there are no changes at this time.   4. Type 2 diabetes mellitus with  microalbuminuria, unspecified whether long term insulin use ([place]) Continue hypoglycemic medications as already ordered, these medications have been reviewed and there are no changes at this time.  Hgb A1C to be monitored as already arranged by primary service   5. Dyslipidemia Continue statin as ordered and reviewed, no changes at this time   6. Simple chronic bronchitis ([facility]) Continue pulmonary medications and aerosols as already ordered, these medications have been reviewed and there are no changes at this time.   Hortencia Pilar, MD  12/31/2017 3:10 PM

## 2018-01-01 ENCOUNTER — Telehealth (INDEPENDENT_AMBULATORY_CARE_PROVIDER_SITE_OTHER): Payer: Self-pay

## 2018-01-01 LAB — URINE CULTURE: Culture: >=100000 — AB

## 2018-01-01 NOTE — Telephone Encounter (Signed)
I left a message for a return call yesterday and the patient's wife called back today. I gave her the appt with Dr. Clayborn Bigness at [facility] for cardiac clearance.01/02/18 @ 2:15pm. The patient's wife stated that the patient is not mobile and appts can't just be made, I gave her the number to [facility] to reschedule the patient's appt to suit their needs.

## 2018-01-04 ENCOUNTER — Telehealth: Payer: Self-pay | Admitting: Family Medicine

## 2018-01-04 NOTE — Telephone Encounter (Signed)
Copied from [place] [id number]. Topic: General - Other >> Jan 04, 2018  9:07 AM Keene Breath wrote: Reason for CRM: Sandy with [facility] called to give verbal orders for the patient.  She stated that when she went to evaluate the patient, the wife told her that he was doing fine and did not need speech therapy any longer.  Sandy informed the wife that if that should change, please give her a call at [phone].  Please advise.

## 2018-01-09 ENCOUNTER — Encounter: Payer: Self-pay | Admitting: Family Medicine

## 2018-01-09 ENCOUNTER — Ambulatory Visit: Payer: Medicare Other | Admitting: Family Medicine

## 2018-01-09 VITALS — BP 122/78 | HR 84 | Temp 98.3°F | Ht 77.0 in | Wt 195.0 lb

## 2018-01-09 DIAGNOSIS — N289 Disorder of kidney and ureter, unspecified: Secondary | ICD-10-CM

## 2018-01-09 DIAGNOSIS — E1169 Type 2 diabetes mellitus with other specified complication: Secondary | ICD-10-CM

## 2018-01-09 DIAGNOSIS — E785 Hyperlipidemia, unspecified: Secondary | ICD-10-CM

## 2018-01-09 DIAGNOSIS — E46 Unspecified protein-calorie malnutrition: Secondary | ICD-10-CM

## 2018-01-09 DIAGNOSIS — E1129 Type 2 diabetes mellitus with other diabetic kidney complication: Secondary | ICD-10-CM

## 2018-01-09 DIAGNOSIS — E1122 Type 2 diabetes mellitus with diabetic chronic kidney disease: Secondary | ICD-10-CM | POA: Diagnosis not present

## 2018-01-09 DIAGNOSIS — Z993 Dependence on wheelchair: Secondary | ICD-10-CM

## 2018-01-09 DIAGNOSIS — J411 Mucopurulent chronic bronchitis: Secondary | ICD-10-CM

## 2018-01-09 DIAGNOSIS — N183 Chronic kidney disease, stage 3 unspecified: Secondary | ICD-10-CM

## 2018-01-09 DIAGNOSIS — I129 Hypertensive chronic kidney disease with stage 1 through stage 4 chronic kidney disease, or unspecified chronic kidney disease: Secondary | ICD-10-CM

## 2018-01-09 DIAGNOSIS — R809 Proteinuria, unspecified: Secondary | ICD-10-CM

## 2018-01-09 NOTE — Progress Notes (Signed)
Name: Joseph Newman   MRN: [id number]    DOB: 1945/06/02   Date:01/09/2018       Progress Note  Subjective  Chief Complaint  Chief Complaint  Patient presents with  . Follow-up  . Hospitalization Follow-up    HPI  HTN: he has been compliant with his medications and denies side effects. He came in with his wife , she manages his medications , recently discharged from rehab and there was some medication changes she states he is now on Losartan and  and Coreg, not sure if on Tribenzor (she will fax the names and doses of medication to me later today or tomorrow )  He  deniesdizziness, palpitationor chest pain. BP has been at goal at home and also here in the office today   Hyperlipidemia:wife is not sure if he is taking  Atorvastatin, denies myalgias.   COPD: since last visit back in May he quit smoking, he is coughing less, usually dry. No wheezing or SOB   DM: sees Endo at [facility], on Trulicity LOVF6E back in September was 7.3% , fasting glucose at home is in the low 100's, wife brought the meter in today. He denies polyphagia, polydipsia, he is still able to void even though on HD, using temporary access but evaluated for fistula by Dr. Erven Colla recently.   Malnutrition: weight at HD went from 239 lbs to 195 lbs at hospital recently , since Summer 2019. 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 End stage renal disease ([place]) 12/31/2017  . Chronic gouty arthritis 10/22/2017  . Hyperlipidemia associated with type 2 diabetes mellitus ([place]) 09/06/2017  . Chronic kidney disease (CKD), stage V ([place]) 07/26/2017  . Dyslipidemia associated with type 2 diabetes mellitus ([place]) 03/13/2016  . Diabetic ulcer of toe of right foot associated with type 2 diabetes mellitus, limited to breakdown of skin ([place]) 03/07/2016  . Type 2 diabetes mellitus with diabetic  nephropathy, without long-term current use of insulin ([place]) 06/17/2015  . Microscopic hematuria 05/30/2015  . 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 Dyslipidemia 08/31/2014  . Diabetes mellitus with renal manifestation ([place]) 08/31/2014  . COPD (chronic obstructive pulmonary disease) ([place]) 08/31/2014  . Microalbuminuria 08/31/2014  . ED (erectile dysfunction) 08/31/2014  . Hypertension     Past Surgical History:  Procedure Laterality Date  . DIALYSIS/PERMA CATHETER INSERTION N/A 09/28/2017   Procedure: DIALYSIS/PERMA CATHETER INSERTION;  Surgeon: Katha Cabal, MD;  Location: [facility];  Service: Cardiovascular;  Laterality: N/A;  . foot surgery      Family History  Problem Relation Age of Onset  . Hypertension Mother   . Kidney disease Neg Hx   . Prostate cancer Neg Hx     Social History   Socioeconomic History  . Marital status: Married    Spouse name: Not on file  . Number of children: 2  . Years of education: Not on file  . Highest education level: Not on file  Occupational History  . Occupation: reitred   Social Needs  . Financial resource strain: Somewhat hard  . Food insecurity:    Worry: Never true    Inability: Never true  . Transportation needs:    Medical: No    Non-medical: No  Tobacco Use  . Smoking status: Former Smoker    Packs/day: 0.25  Years: 30.00    Pack years: 7.50    Types: Cigarettes, Cigars    Last attempt to quit: 07/23/2017    Years since quitting: 0.4  . Smokeless tobacco: Never Used  Substance and Sexual Activity  . Alcohol use: Yes    Alcohol/week: 0.0 standard drinks    Comment: occasional  . Drug use: No  . Sexual activity: Not Currently  Lifestyle  . Physical activity:    Days per week: 0 days    Minutes per session: 0 min  . Stress: Not on file  Relationships  . Social connections:    Talks on phone: Twice a week    Gets together: Once a week    Attends  religious service: Never    Active member of club or organization: No    Attends meetings of clubs or organizations: Never    Relationship status: Married  . Intimate partner violence:    Fear of current or ex partner: No    Emotionally abused: No    Physically abused: No    Forced sexual activity: No  Other Topics Concern  . Not on file  Social History Narrative   Lives with wife     Current Outpatient Medications:  .  allopurinol (ZYLOPRIM) 100 MG tablet, Take 0.5 tablets (50 mg total) by mouth every other day., Disp: 30 tablet, Rfl: 2 .  aspirin EC 81 MG tablet, Take 81 mg by mouth daily., Disp: , Rfl:  .  calcitRIOL (ROCALTROL) 0.25 MCG capsule, Take 0.25 mcg by mouth daily. , Disp: , Rfl:  .  carvedilol (COREG) 6.25 MG tablet, Take 6.25 mg by mouth 2 (two) times daily with a meal. , Disp: , Rfl:  .  Cholecalciferol (VITAMIN D) 2000 UNITS CAPS, Take 1 capsule by mouth daily. , Disp: , Rfl:  .  Cyanocobalamin (VITAMIN B-12) 1000 MCG SUBL, Take 1 tablet by mouth daily., Disp: , Rfl:  .  losartan (COZAAR) 100 MG tablet, GIVE 1 TABLET BY MOUTH ONE TIME A DAY, Disp: , Rfl: 0 .  multivitamin (RENA-VIT) TABS tablet, Take 1 tablet by mouth at bedtime., Disp: 30 tablet, Rfl: 0 .  Nutritional Supplements (FEEDING SUPPLEMENT, NEPRO CARB STEADY,) LIQD, Take 237 mLs by mouth 3 (three) times daily between meals., Disp: 90 Can, Rfl: 0 .  ondansetron (ZOFRAN ODT) 4 MG disintegrating tablet, Take 1 tablet (4 mg total) by mouth every 8 (eight) hours as needed for nausea or vomiting., Disp: 20 tablet, Rfl: 0 .  tamsulosin (FLOMAX) 0.4 MG CAPS capsule, Take 0.4 mg by mouth daily after breakfast. , Disp: , Rfl:  .  TRULICITY 1.5 PR/9.1MB SOPN, , Disp: , Rfl:  .  acetaminophen (TYLENOL) 325 MG tablet, Take 2 tablets (650 mg total) by mouth every 6 (six) hours as needed for mild pain (or Fever >/= 101). (Patient not taking: Reported on 12/31/2017), Disp: , Rfl:  .  Blood Glucose Monitoring Suppl (ONE TOUCH  ULTRA SYSTEM KIT) W/DEVICE KIT, 1 kit by Does not apply route once., Disp: , Rfl:  .  fluticasone (FLONASE) 50 MCG/ACT nasal spray, Place 2 sprays into both nostrils daily. (Patient not taking: Reported on 01/09/2018), Disp: 16 g, Rfl: 0 .  OXYGEN, Inhale into the lungs. 2. liters, Disp: , Rfl:   No Known Allergies  I personally reviewed active problem list, medication list, allergies, family history, social history with the patient/caregiver today.   ROS  Constitutional: Negative for fever, positive for weight change.  Respiratory: Negative  for cough and shortness of breath.   Cardiovascular: Negative for chest pain or palpitations.  Gastrointestinal: Negative for abdominal pain, no bowel changes.  Musculoskeletal: positive for gait problem but no  joint swelling.  Skin: Negative for rash.  Neurological: Negative for dizziness or headache.  No other specific complaints in a complete review of systems (except as listed in HPI above).  Objective  Vitals:   01/09/18 1447  BP: 122/78  Pulse: 84  Temp: 98.3 F (36.8 C)  TempSrc: Oral  SpO2: 98%  Height: '6\' 5"'$  (1.956 m)    Body mass index is 23.12 kg/m.  Physical Exam  Constitutional: Patient appears well-developed and well-nourished. Temporal waisting No distress.  HEENT: head atraumatic, normocephalic, pupils equal and reactive to light, neck supple, throat within normal limits Cardiovascular: Normal rate, regular rhythm and normal heart sounds.  No murmur heard. No BLE edema. Pulmonary/Chest: Effort normal and breath sounds normal. No respiratory distress. Abdominal: Soft.  There is no tenderness. Psychiatric: Patient has a normal mood and affect. behavior is normal. Judgment and thought content normal.  PHQ2/9: Depression screen [facility] 2/9 01/09/2018 12/19/2017 06/15/2017 03/13/2016 11/10/2015  Decreased Interest 0 0 3 0 0  Down, Depressed, Hopeless 0 0 0 0 0  PHQ - 2 Score 0 0 3 0 0  Altered sleeping 0 - 3 - -  Tired,  decreased energy 0 - 3 - -  Change in appetite 0 - 0 - -  Feeling bad or failure about yourself  0 - 2 - -  Trouble concentrating 0 - 0 - -  Moving slowly or fidgety/restless 0 - 0 - -  Suicidal thoughts 0 - 0 - -  PHQ-9 Score 0 - 11 - -  Difficult doing work/chores Not difficult at all - Not difficult at all - -     Fall Risk: Fall Risk  01/09/2018 12/19/2017 06/15/2017 06/15/2017 03/13/2016  Falls in the past year? 0 0 No No Yes  Number falls in past yr: 0 - - - 1  Injury with Fall? 0 - - - No     Functional Status Survey: Is the patient deaf or have difficulty hearing?: No Does the patient have difficulty seeing, even when wearing glasses/contacts?: No Does the patient have difficulty concentrating, remembering, or making decisions?: No Does the patient have difficulty walking or climbing stairs?: No Does the patient have difficulty dressing or bathing?: No Does the patient have difficulty doing errands alone such as visiting a doctor's office or shopping?: No    Assessment & Plan  1. Type 2 diabetes mellitus with microalbuminuria, unspecified whether long term insulin use ([place])  Doing well, continue medication and follow up with endocrinologist   2. Dyslipidemia associated with type 2 diabetes mellitus ([place])  Not sure if taking statins at this time  3. Dyslipidemia  Wife will bring current medications tomorrow  4. Hypertension associated with stage 3 chronic kidney disease due to type 2 diabetes mellitus ([place])  bp at goal now  5. 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 Mucopurulent chronic bronchitis ([place])  Doing well since he quit smoking    7. Uses wheelchair  - Ambulatory referral to Chronic Care Management Services  Wife started the cost of medication care and paying for transportation ( she  cannot transfer him ) has caused a financial  burn, please assist her

## 2018-01-11 ENCOUNTER — Ambulatory Visit: Payer: Self-pay

## 2018-01-11 ENCOUNTER — Other Ambulatory Visit: Payer: Self-pay | Admitting: Family Medicine

## 2018-01-11 DIAGNOSIS — R809 Proteinuria, unspecified: Principal | ICD-10-CM

## 2018-01-11 DIAGNOSIS — E1129 Type 2 diabetes mellitus with other diabetic kidney complication: Secondary | ICD-10-CM

## 2018-01-11 DIAGNOSIS — N183 Chronic kidney disease, stage 3 unspecified: Secondary | ICD-10-CM

## 2018-01-11 DIAGNOSIS — E785 Hyperlipidemia, unspecified: Secondary | ICD-10-CM

## 2018-01-11 DIAGNOSIS — J410 Simple chronic bronchitis: Secondary | ICD-10-CM

## 2018-01-11 DIAGNOSIS — I10 Essential (primary) hypertension: Secondary | ICD-10-CM

## 2018-01-11 MED ORDER — OLMESARTAN-AMLODIPINE-HCTZ 40-10-25 MG PO TABS: 1.0000 | ORAL_TABLET | Freq: Every day | ORAL | 0 refills | Status: DC

## 2018-01-11 MED ORDER — ATORVASTATIN CALCIUM 40 MG PO TABS: 40.0000 mg | ORAL_TABLET | Freq: Every day | ORAL | 0 refills | Status: DC

## 2018-01-11 NOTE — Patient Instructions (Signed)
1. Thank You for allowing the CCM (Chronic Care Management) Team to assist you with your healthcare goals!! We look forward to meeting you on 01/30/17 at 2:00 2. Please bring ALL medications to your appointment! If you have a blood sugar meter or a blood pressure monitor at home, bring those as well.  3.  Contact the CCM Team if you have any question or need to reschedule your initial visit.  CCM (Chronic Care Management) Team   Trish Fountain RN, BSN Nurse Care Coordinator  [phone]  Ruben Reason PharmD  Clinical Pharmacist  [phone]   The patient verbalized understanding of instructions provided today and declined a print copy of patient instruction materials.

## 2018-01-11 NOTE — Chronic Care Management (AMB) (Signed)
  Chronic Care Management   Note  01/11/2018 Name: Joseph Newman MRN: [id number] DOB: 11-07-1945   [name] is a 72 y.o. year old male who sees Dr. Steele Sizer for primary care. Dr. Ancil Boozer asked the CCM team to consult the patient for assistance with chronic disease management related to transportation needs. Patient has a medical history of HTN, COPD, DM with A1C 7.3, Nephropathy, CKD requiring dialysis. Referral was placed 01/09/18. Telephone outreach to patient today to introduce CCM services.   [name] agreed that CCM services would be helpful to them.   [name] was given information about Chronic Care Management services today including:  1. CCM service includes personalized support from designated clinical staff supervised by his physician, including individualized plan of care and coordination with other care providers 2. 24/7 contact phone numbers for assistance for urgent and routine care needs. 3. Service will only be billed when office clinical staff spend 20 minutes or more in a month to coordinate care. 4. Only one practitioner may furnish and bill the service in a calendar month. 5. The patient may stop CCM services at any time (effective at the end of the month) by phone call to the office staff. 6. The patient will be responsible for cost sharing (co-pay) of up to 20% of the service fee (after annual deductible is met).  Patient agreed to services and verbal consent obtained.  Plan: CCM Team will meet with patient and wife for face to face visit on Wednesday 01/30/17 at 2:00    [place]. Rollene Rotunda, RN, BSN Nurse Care Coordinator [facility] Care Management  [phone]

## 2018-01-18 ENCOUNTER — Other Ambulatory Visit: Payer: Self-pay | Admitting: Internal Medicine

## 2018-01-18 DIAGNOSIS — R011 Cardiac murmur, unspecified: Secondary | ICD-10-CM

## 2018-01-18 DIAGNOSIS — Z01818 Encounter for other preprocedural examination: Secondary | ICD-10-CM

## 2018-01-19 ENCOUNTER — Emergency Department
Admission: EM | Admit: 2018-01-19 | Discharge: 2018-01-20 | Disposition: A | Discharge status: Home / Self Care | Payer: Medicare Other | Attending: Emergency Medicine | Admitting: Emergency Medicine

## 2018-01-19 ENCOUNTER — Other Ambulatory Visit: Payer: Self-pay

## 2018-01-19 DIAGNOSIS — Z79899 Other long term (current) drug therapy: Secondary | ICD-10-CM | POA: Insufficient documentation

## 2018-01-19 DIAGNOSIS — Z87891 Personal history of nicotine dependence: Secondary | ICD-10-CM | POA: Insufficient documentation

## 2018-01-19 DIAGNOSIS — I120 Hypertensive chronic kidney disease with stage 5 chronic kidney disease or end stage renal disease: Secondary | ICD-10-CM | POA: Diagnosis not present

## 2018-01-19 DIAGNOSIS — J449 Chronic obstructive pulmonary disease, unspecified: Secondary | ICD-10-CM | POA: Diagnosis not present

## 2018-01-19 DIAGNOSIS — N186 End stage renal disease: Secondary | ICD-10-CM | POA: Diagnosis not present

## 2018-01-19 DIAGNOSIS — F329 Major depressive disorder, single episode, unspecified: Secondary | ICD-10-CM | POA: Diagnosis not present

## 2018-01-19 DIAGNOSIS — Z992 Dependence on renal dialysis: Secondary | ICD-10-CM | POA: Insufficient documentation

## 2018-01-19 DIAGNOSIS — Z7982 Long term (current) use of aspirin: Secondary | ICD-10-CM | POA: Insufficient documentation

## 2018-01-19 DIAGNOSIS — N3000 Acute cystitis without hematuria: Secondary | ICD-10-CM | POA: Diagnosis not present

## 2018-01-19 DIAGNOSIS — E1122 Type 2 diabetes mellitus with diabetic chronic kidney disease: Secondary | ICD-10-CM | POA: Diagnosis not present

## 2018-01-19 DIAGNOSIS — R300 Dysuria: Secondary | ICD-10-CM | POA: Diagnosis present

## 2018-01-19 NOTE — ED Triage Notes (Signed)
Pt arrived from home via ems with dysuria. Pt just finished antibiotics for UTI. Pt is bed bound and lives at home with his wife. NAD at present, respirations even and non labored.

## 2018-01-20 LAB — CBC
HCT: 36.3 % — ABNORMAL LOW (ref 39.0–52.0)
HEMOGLOBIN: 11.5 g/dL — AB (ref 13.0–17.0)
MCH: 26.6 pg (ref 26.0–34.0)
MCHC: 31.7 g/dL (ref 30.0–36.0)
MCV: 83.8 fL (ref 80.0–100.0)
Platelets: 222 10*3/uL (ref 150–400)
RBC: 4.33 MIL/uL (ref 4.22–5.81)
RDW: 15.6 % — ABNORMAL HIGH (ref 11.5–15.5)
WBC: 8.4 10*3/uL (ref 4.0–10.5)
nRBC: 0 % (ref 0.0–0.2)

## 2018-01-20 LAB — URINALYSIS, COMPLETE (UACMP) WITH MICROSCOPIC
Bilirubin Urine: NEGATIVE
Glucose, UA: NEGATIVE mg/dL
Ketones, ur: NEGATIVE mg/dL
Nitrite: NEGATIVE
Protein, ur: 100 mg/dL — AB
Specific Gravity, Urine: 1.011 (ref 1.005–1.030)
Squamous Epithelial / HPF: NONE SEEN (ref 0–5)
WBC, UA: >50 WBC/hpf — ABNORMAL HIGH (ref 0–5)
pH: 8 (ref 5.0–8.0)

## 2018-01-20 LAB — COMPREHENSIVE METABOLIC PANEL
ALK PHOS: 80 U/L (ref 38–126)
ALT: 10 U/L (ref 0–44)
AST: 16 U/L (ref 15–41)
Albumin: 3.1 g/dL — ABNORMAL LOW (ref 3.5–5.0)
Anion gap: 6 (ref 5–15)
BUN: 15 mg/dL (ref 8–23)
CO2: 29 mmol/L (ref 22–32)
Calcium: 8.8 mg/dL — ABNORMAL LOW (ref 8.9–10.3)
Chloride: 102 mmol/L (ref 98–111)
Creatinine, Ser: 2.42 mg/dL — ABNORMAL HIGH (ref 0.61–1.24)
GFR calc Af Amer: 29 mL/min — ABNORMAL LOW (ref >60)
GFR calc non Af Amer: 25 mL/min — ABNORMAL LOW (ref >60)
Glucose, Bld: 120 mg/dL — ABNORMAL HIGH (ref 70–99)
Potassium: 3.8 mmol/L (ref 3.5–5.1)
Sodium: 137 mmol/L (ref 135–145)
Total Bilirubin: 0.6 mg/dL (ref 0.3–1.2)
Total Protein: 6.7 g/dL (ref 6.5–8.1)

## 2018-01-20 MED ORDER — NITROFURANTOIN MONOHYD MACRO 100 MG PO CAPS: 100.0000 mg | ORAL_CAPSULE | Freq: Two times a day (BID) | ORAL | 0 refills | Status: AC

## 2018-01-20 MED ORDER — SODIUM CHLORIDE 0.9 % IV SOLN
1.0000 g | Freq: Three times a day (TID) | INTRAVENOUS | Status: DC
  Administered 2018-01-20: 1 g via INTRAVENOUS
  Filled 2018-01-20 (×3): qty 1

## 2018-01-20 MED ORDER — NITROFURANTOIN MONOHYD MACRO 100 MG PO CAPS
100.0000 mg | ORAL_CAPSULE | Freq: Once | ORAL | Status: AC
  Administered 2018-01-20: 100 mg via ORAL
  Filled 2018-01-20: qty 1

## 2018-01-20 NOTE — ED Notes (Signed)
EMS will transport the pt home.

## 2018-01-20 NOTE — ED Provider Notes (Signed)
[facility] Emergency Department Provider Note   First MD Initiated Contact with Patient 01/20/18 0001     (approximate)  I have reviewed the triage vital signs and the nursing notes.   HISTORY  Chief Complaint Dysuria   HPI Joseph Newman is a 72 y.o. male with below list of chronic medical conditions presents to the emergency department secondary to continued dysuria.  Patient was seen on 12/30/2017 and diagnosed with a urinary tract infection for which the patient was prescribed Keflex.  Patient states that symptoms have not resolved.  Patient denies any fever afebrile on presentation.  Patient denies any nausea or vomiting.   Past Medical History:  Diagnosis Date  . Acute metabolic encephalopathy 33/83/2919   Per my chart  . Arthritis   . Chronic airway obstruction, not elsewhere classified   . 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 Holley Raring, nephrologist  . Depressive disorder, not elsewhere classified   . Gout   . Heart murmur   . Hyperlipidemia   . Hypertension   . Hypertrophy of prostate without urinary obstruction and other lower urinary tract symptoms (LUTS)   . Impotence of organic origin   . Microalbuminuria   . 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 Obesity, unspecified   . Plantar wart   . Sleep apnea   . Synovitis and tenosynovitis, unspecified   . Type II or unspecified type diabetes mellitus without mention of complication, uncontrolled   . Unspecified venous (peripheral) insufficiency     Patient Active Problem List   Diagnosis Date Noted  . End stage renal disease ([place]) 12/31/2017  . Chronic gouty arthritis 10/22/2017  . Hyperlipidemia associated with type 2 diabetes mellitus ([place]) 09/06/2017  . Chronic kidney disease (CKD), stage V ([place]) 07/26/2017  . Dyslipidemia associated with type 2 diabetes mellitus ([place]) 03/13/2016  . Diabetic ulcer of toe of right foot associated with type 2  diabetes mellitus, limited to breakdown of skin ([place]) 03/07/2016  . Type 2 diabetes mellitus with diabetic nephropathy, without long-term current use of insulin ([place]) 06/17/2015  . Microscopic hematuria 05/30/2015  . 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 Dyslipidemia 08/31/2014  . Diabetes mellitus with renal manifestation ([place]) 08/31/2014  . COPD (chronic obstructive pulmonary disease) ([place]) 08/31/2014  . Microalbuminuria 08/31/2014  . ED (erectile dysfunction) 08/31/2014  . Hypertension     Past Surgical History:  Procedure Laterality Date  . DIALYSIS/PERMA CATHETER INSERTION N/A 09/28/2017   Procedure: DIALYSIS/PERMA CATHETER INSERTION;  Surgeon: Katha Cabal, MD;  Location: [place] [facility];  Service: Cardiovascular;  Laterality: N/A;  . foot surgery      Prior to Admission medications   Medication Sig Start Date End Date Taking? Authorizing Provider  acetaminophen (TYLENOL) 325 MG tablet Take 2 tablets (650 mg total) by mouth every 6 (six) hours as needed for mild pain (or Fever >/= 101). Patient not taking: Reported on 12/31/2017 11/07/17   Loletha Grayer, MD  allopurinol (ZYLOPRIM) 100 MG tablet Take 0.5 tablets (50 mg total) by mouth every other day. 09/30/17   Gladstone Lighter, MD  aspirin EC 81 MG tablet Take 81 mg by mouth daily.    [provider]  atorvastatin (LIPITOR) 40 MG tablet Take 1 tablet (40 mg total) by mouth daily. 01/11/18   Steele Sizer, MD  Blood Glucose Monitoring Suppl (ONE TOUCH ULTRA SYSTEM KIT) W/DEVICE KIT 1 kit by Does not apply route  once.    [provider]  calcitRIOL (ROCALTROL) 0.25 MCG capsule Take 0.25 mcg by mouth daily.  04/02/17   [provider]  carvedilol (COREG) 6.25 MG tablet Take 6.25 mg by mouth 2 (two) times daily with a meal.  04/05/15   Holley Raring, [place], MD  Cholecalciferol (VITAMIN D) 2000 UNITS CAPS Take 1 capsule by mouth daily.     [provider]  Cyanocobalamin (VITAMIN B-12) 1000 MCG SUBL Take 1 tablet by mouth daily.    [provider]  fluticasone (FLONASE) 50 MCG/ACT nasal spray Place 2 sprays into both nostrils daily. Patient not taking: Reported on 01/09/2018 11/08/17   Loletha Grayer, MD  multivitamin (RENA-VIT) TABS tablet Take 1 tablet by mouth at bedtime. 11/07/17   Loletha Grayer, MD  nitrofurantoin, macrocrystal-monohydrate, (MACROBID) 100 MG capsule Take 1 capsule (100 mg total) by mouth 2 (two) times daily for 7 days. 01/20/18 01/27/18  Gregor Hams, MD  Nutritional Supplements (FEEDING SUPPLEMENT, NEPRO CARB STEADY,) LIQD Take 237 mLs by mouth 3 (three) times daily between meals. 11/07/17   Loletha Grayer, MD  Olmesartan-amLODIPine-HCTZ 40-10-25 MG TABS Take 1 tablet by mouth daily. 01/11/18   Steele Sizer, MD  ondansetron (ZOFRAN ODT) 4 MG disintegrating tablet Take 1 tablet (4 mg total) by mouth every 8 (eight) hours as needed for nausea or vomiting. 12/30/17   Harvest Dark, MD  OXYGEN Inhale into the lungs. 2. liters    [provider]  tamsulosin (FLOMAX) 0.4 MG CAPS capsule Take 0.4 mg by mouth daily after breakfast.     [provider]  TRULICITY 1.5 OE/3.2ZY SOPN  11/21/17   [provider]    Allergies No known drug allergies  Family History  Problem Relation Age of Onset  . Hypertension Mother   . Kidney disease Neg Hx   . Prostate cancer Neg Hx     Social History Social History   Tobacco Use  . Smoking status: Former Smoker    Packs/day: 0.25    Years: 30.00    Pack years: 7.50    Types: Cigarettes, Cigars    Last attempt to quit: 07/23/2017    Years since quitting: 0.4  . Smokeless tobacco: Never Used  Substance Use Topics  . Alcohol use: Yes    Alcohol/week: 0.0 standard drinks    Comment: occasional  . Drug use: No    Review of Systems Constitutional: No fever/chills Eyes: No visual changes. ENT: No sore  throat. Cardiovascular: Denies chest pain. Respiratory: Denies shortness of breath. Gastrointestinal: No abdominal pain.  No nausea, no vomiting.  No diarrhea.  No constipation. Genitourinary: Positive for dysuria. Musculoskeletal: Negative for neck pain.  Negative for back pain. Integumentary: Negative for rash. Neurological: Negative for headaches, focal weakness or numbness.  ____________________________________________   PHYSICAL EXAM:  VITAL SIGNS: ED Triage Vitals [01/19/18 2345]  Enc Vitals Group     BP (!) 148/78     Pulse Rate 82     Resp 20     Temp 98.9 F (37.2 C)     Temp Source Oral     SpO2 96 %     Weight 88.5 kg (195 lb)     Height 1.956 m (6' 5" )     Head Circumference      Peak Flow      Pain Score 0     Pain Loc      Pain Edu?      Excl. in [place]?  Constitutional: Alert and oriented. Well appearing and in no acute distress. Eyes: Conjunctivae are normal.  Mouth/Throat: Mucous membranes are moist.  Oropharynx non-erythematous. Neck: No stridor.   Cardiovascular: Normal rate, regular rhythm. Good peripheral circulation. Grossly normal heart sounds. Respiratory: Normal respiratory effort.  No retractions. Lungs CTAB. Gastrointestinal: Soft and nontender. No distention.  Musculoskeletal: No lower extremity tenderness nor edema. No gross deformities of extremities. Neurologic:  Normal speech and language. No gross focal neurologic deficits are appreciated.  Skin:  Skin is warm, dry and intact. No rash noted. Psychiatric: Mood and affect are normal. Speech and behavior are normal.  ____________________________________________   LABS (all labs ordered are listed, but only abnormal results are displayed)  Labs Reviewed  URINALYSIS, COMPLETE (UACMP) WITH MICROSCOPIC - Abnormal; Notable for the following components:      Result Value   Color, Urine AMBER (*)    APPearance TURBID (*)    Hgb urine dipstick SMALL (*)    Protein, ur 100 (*)     Leukocytes, UA SMALL (*)    WBC, UA >50 (*)    Bacteria, UA RARE (*)    All other components within normal limits  CBC - Abnormal; Notable for the following components:   Hemoglobin 11.5 (*)    HCT 36.3 (*)    RDW 15.6 (*)    All other components within normal limits  COMPREHENSIVE METABOLIC PANEL - Abnormal; Notable for the following components:   Glucose, Bld 120 (*)    Creatinine, Ser 2.42 (*)    Calcium 8.8 (*)    Albumin 3.1 (*)    GFR calc non Af Amer 25 (*)    GFR calc Af Amer 29 (*)    All other components within normal limits  URINE CULTURE    Procedures   ____________________________________________   INITIAL IMPRESSION / ASSESSMENT AND PLAN / ED COURSE  As part of my medical decision making, I reviewed the following data within the electronic MEDICAL RECORD NUMBER  72 year old male presenting with above-stated history and physical exam consistent with urinary tract infection.  I reviewed the patient's urine culture from 12 8 which revealed E. coli with multiple resistance however patient is sensitive to nitrofurantoin.  Patient 1 g of meropenem in the emergency department will be prescribed nitrofurantoin for home.  Spoke with the patient and his wife at length regarding the necessity to return to the emergency department if symptoms worsen fever or back pain were to occur. ____________________________________________  FINAL CLINICAL IMPRESSION(S) / ED DIAGNOSES  Final diagnoses:  Acute cystitis without hematuria     MEDICATIONS GIVEN DURING THIS VISIT:  Medications  meropenem (MERREM) 1 g in sodium chloride 0.9 % 100 mL IVPB (0 g Intravenous Stopped 01/20/18 0155)  nitrofurantoin (macrocrystal-monohydrate) (MACROBID) capsule 100 mg (100 mg Oral Given 01/20/18 0102)     ED Discharge Orders         Ordered    nitrofurantoin, macrocrystal-monohydrate, (MACROBID) 100 MG capsule  2 times daily     01/20/18 0143           Note:  This document was prepared  using Dragon voice recognition software and may include unintentional dictation errors.    Gregor Hams, MD 01/20/18 [phone]

## 2018-01-22 LAB — URINE CULTURE: Culture: >=100000 — AB

## 2018-01-23 DIAGNOSIS — Z992 Dependence on renal dialysis: Secondary | ICD-10-CM

## 2018-01-23 HISTORY — DX: Dependence on renal dialysis: Z99.2

## 2018-01-25 ENCOUNTER — Ambulatory Visit
Admission: RE | Admit: 2018-01-25 | Discharge: 2018-01-25 | Disposition: A | Discharge status: Home / Self Care | Payer: Medicare Other | Source: Ambulatory Visit | Attending: Internal Medicine | Admitting: Internal Medicine

## 2018-01-25 DIAGNOSIS — R011 Cardiac murmur, unspecified: Secondary | ICD-10-CM | POA: Insufficient documentation

## 2018-01-25 DIAGNOSIS — N189 Chronic kidney disease, unspecified: Secondary | ICD-10-CM | POA: Diagnosis not present

## 2018-01-25 DIAGNOSIS — E785 Hyperlipidemia, unspecified: Secondary | ICD-10-CM | POA: Diagnosis not present

## 2018-01-25 DIAGNOSIS — I129 Hypertensive chronic kidney disease with stage 1 through stage 4 chronic kidney disease, or unspecified chronic kidney disease: Secondary | ICD-10-CM | POA: Diagnosis not present

## 2018-01-25 DIAGNOSIS — G4730 Sleep apnea, unspecified: Secondary | ICD-10-CM | POA: Insufficient documentation

## 2018-01-25 DIAGNOSIS — E1122 Type 2 diabetes mellitus with diabetic chronic kidney disease: Secondary | ICD-10-CM | POA: Insufficient documentation

## 2018-01-25 DIAGNOSIS — Z01818 Encounter for other preprocedural examination: Secondary | ICD-10-CM

## 2018-01-25 NOTE — Progress Notes (Signed)
*  PRELIMINARY RESULTS* Echocardiogram 2D Echocardiogram has been performed.  Joseph Newman 01/25/2018, 10:07 AM

## 2018-01-29 ENCOUNTER — Telehealth: Payer: Self-pay | Admitting: Family Medicine

## 2018-01-29 NOTE — Telephone Encounter (Signed)
Copied from [place] [phone]. Topic: General - Other >> Jan 21, 2018 12:46 PM Yvette Rack wrote: Reason for CRM: pt wife [name] calling stating that pt has an appt with case manager on 01-30-17 she states that her husband need a ER f/u visit she would like to combine the appt because they have to use transportation >> Jan 21, 2018  1:24 PM Orvis Brill B wrote: Hulen Skains pt wife and told her that [facility] does not have anything on that date for ER followup. Would could let the NP see him in the morning but his CCM appt is not till 2. She is going to see if they could see him in the morning of that date and then see NP Raquel Sarna that same day. Will call back when she talks with them >> Jan 21, 2018  1:29 PM Yvette Rack wrote: Pt wife stated that she would like the chronic care appt for 01/30/18 at 2 pm to be moved as close as possible to the hospital follow up appt scheduled 01/30/18 at 10:20 am. Cb# [phone]

## 2018-01-29 NOTE — Telephone Encounter (Signed)
Thank you, we hadn't gotten the message. That is okay with [place], I left a HIPAA compliant message on home voicemail and reschedule Mr. Lakins for an appointment with CCM following his appointment with Dr. Ancil Boozer.   Ruben Reason, PharmD Clinical Pharmacist [facility] [phone]

## 2018-01-30 ENCOUNTER — Ambulatory Visit (INDEPENDENT_AMBULATORY_CARE_PROVIDER_SITE_OTHER): Payer: Medicare Other | Admitting: Family Medicine

## 2018-01-30 ENCOUNTER — Ambulatory Visit: Payer: Self-pay

## 2018-01-30 ENCOUNTER — Ambulatory Visit (INDEPENDENT_AMBULATORY_CARE_PROVIDER_SITE_OTHER): Payer: Medicare Other

## 2018-01-30 ENCOUNTER — Encounter: Payer: Self-pay | Admitting: Family Medicine

## 2018-01-30 VITALS — BP 120/72 | HR 90 | Temp 99.3°F | Resp 18 | Ht 77.0 in | Wt 195.0 lb

## 2018-01-30 DIAGNOSIS — N3000 Acute cystitis without hematuria: Secondary | ICD-10-CM | POA: Diagnosis not present

## 2018-01-30 DIAGNOSIS — E46 Unspecified protein-calorie malnutrition: Secondary | ICD-10-CM

## 2018-01-30 DIAGNOSIS — Z992 Dependence on renal dialysis: Secondary | ICD-10-CM | POA: Diagnosis not present

## 2018-01-30 DIAGNOSIS — E1129 Type 2 diabetes mellitus with other diabetic kidney complication: Secondary | ICD-10-CM

## 2018-01-30 DIAGNOSIS — I129 Hypertensive chronic kidney disease with stage 1 through stage 4 chronic kidney disease, or unspecified chronic kidney disease: Secondary | ICD-10-CM | POA: Diagnosis not present

## 2018-01-30 DIAGNOSIS — E1122 Type 2 diabetes mellitus with diabetic chronic kidney disease: Secondary | ICD-10-CM | POA: Diagnosis not present

## 2018-01-30 DIAGNOSIS — N186 End stage renal disease: Secondary | ICD-10-CM

## 2018-01-30 DIAGNOSIS — N289 Disorder of kidney and ureter, unspecified: Secondary | ICD-10-CM

## 2018-01-30 DIAGNOSIS — R809 Proteinuria, unspecified: Secondary | ICD-10-CM

## 2018-01-30 DIAGNOSIS — N183 Chronic kidney disease, stage 3 (moderate): Secondary | ICD-10-CM

## 2018-01-30 MED ORDER — TAMSULOSIN HCL 0.4 MG PO CAPS: 0.4000 mg | ORAL_CAPSULE | Freq: Every day | ORAL | 0 refills | Status: DC

## 2018-01-30 NOTE — Patient Instructions (Signed)
Please collect a urine sample in the cup provided between the dates of 02/11/2018-02/15/2018 and bring back to the office that same day.  If unable to collect urine, just call our office and we can discuss next steps.

## 2018-01-30 NOTE — Chronic Care Management (AMB) (Signed)
Chronic Care Management   Note  01/30/2018 Name: Darrious Youman MRN: [id number] DOB: 1945/10/31  Subjective:   Does the patient  feel that his/her medications are working for him/her?  yes  Has the patient been experiencing any side effects to the medications prescribed?  no  Does the patient measure his/her own blood glucose at home?  yes   Does the patient measure his/her own blood pressure at home? yes   Does the patient have any problems obtaining medications due to transportation or finances?   no  Understanding of regimen: good Understanding of indications: good Potential of compliance: fair  Objective: Lab Results  Component Value Date   CREATININE 2.42 (H) 01/20/2018   CREATININE 2.40 (H) 12/30/2017   CREATININE 4.60 (H) 11/05/2017    Lab Results  Component Value Date   HGBA1C 7.3 (H) 09/29/2017    Lipid Panel     Component Value Date/Time   CHOL 66 09/26/2017 0354   TRIG 44 09/26/2017 0354   HDL 29 (L) 09/26/2017 0354   CHOLHDL 2.3 09/26/2017 0354   VLDL 9 09/26/2017 0354   LDLCALC 28 09/26/2017 0354   LDLCALC 45 06/15/2017 0934    BP Readings from Last 3 Encounters:  01/30/18 120/72  01/20/18 (!) 161/70  01/09/18 122/78    No Known Allergies  Medications Reviewed Today    Reviewed by Cathi Roan, [place] (Pharmacist) on 01/30/18 at 1140  Med List Status: <None>  Medication Order Taking? Sig Documenting Provider Last Dose Status Informant  acetaminophen (TYLENOL) 325 MG tablet [id number]  Take 2 tablets (650 mg total) by mouth every 6 (six) hours as needed for mild pain (or Fever >/= 101).  Patient not taking:  Reported on 12/31/2017   Loletha Grayer, MD  Active   allopurinol (ZYLOPRIM) 100 MG tablet [id number] Yes Take 0.5 tablets (50 mg total) by mouth every other day. Gladstone Lighter, MD Taking Active   aspirin EC 81 MG tablet [id number] Yes Take 81 mg by mouth daily. [provider] Taking Active Pharmacy Records  atorvastatin (LIPITOR)  40 MG tablet [id number] Yes Take 1 tablet (40 mg total) by mouth daily. Steele Sizer, MD Taking Active   Blood Glucose Monitoring Suppl (ONE TOUCH ULTRA SYSTEM KIT) W/DEVICE KIT [id number] Yes 1 kit by Does not apply route once. [provider] Taking Active Pharmacy Records  calcitRIOL (ROCALTROL) 0.25 MCG capsule [id number] Yes Take 0.25 mcg by mouth daily.  [provider] Taking Active Pharmacy Records  carvedilol (COREG) 6.25 MG tablet [id number] Yes Take 6.25 mg by mouth 2 (two) times daily with a meal.  Anthonette Legato, MD Taking Active Pharmacy Records           Med Note Owens Shark, KADESHA   Tue Sep 25, 2017  2:53 PM)    cholecalciferol (VITAMIN D3) 25 MCG (1000 UT) tablet [id number] Yes Take 1 capsule by mouth daily.  [provider] Taking Active Pharmacy Records  Cyanocobalamin (VITAMIN B-12) 1000 MCG SUBL [id number] Yes Take 1 tablet by mouth daily. [provider] Taking Active Pharmacy Records           Med Note Owens Shark, KADESHA   Tue Sep 25, 2017  2:53 PM)    fluticasone (FLONASE) 50 MCG/ACT nasal spray [id number] No Place 2 sprays into both nostrils daily.  Patient not taking:  Reported on 01/09/2018   Loletha Grayer, MD Not Taking Active   multivitamin (RENA-VIT) TABS tablet [id number] Yes Take 1 tablet by mouth at bedtime. Wieting, Richard,  MD Taking Active   Nutritional Supplements (FEEDING SUPPLEMENT, NEPRO CARB STEADY,) LIQD [id number] No Take 237 mLs by mouth 3 (three) times daily between meals.  Patient not taking:  Reported on 01/30/2018   Loletha Grayer, MD Not Taking Active            Med Note [facility], Blair Heys   Wed Jan 30, 2018 11:40 AM) Taking "as needed" and not regularly  Olmesartan-amLODIPine-HCTZ 40-10-25 MG TABS [id number] Yes Take 1 tablet by mouth daily. Steele Sizer, MD Taking Active   ondansetron (ZOFRAN ODT) 4 MG disintegrating tablet [id number] Yes Take 1 tablet (4 mg total) by mouth every 8 (eight) hours as needed for nausea or  vomiting. Harvest Dark, MD Taking Active   tamsulosin [facility]) 0.4 MG CAPS capsule [id number] No Take 1 capsule (0.4 mg total) by mouth daily after breakfast.  Patient not taking:  Reported on 01/30/2018   Hubbard Hartshorn, FNP Not Taking Active   TRULICITY 1.5 JS/2.8BT [employer] [facility] [id number] Yes  [provider] Taking Active            Assessment:   Total Number of meds: 10 prescription med  (polypharmacy > 10 meds)  Indications for all medications: [x]  Yes       []  No  Adherence Review  []  Excellent (no doses missed/week)     []  Good (no more than 1 dose missed/week)     [x]  Partial (2-3 doses missed/week)     []  Poor (>3 doses missed/week)  Intervention  YES NO  Explanation   Safety/Adverse Med Event      Undesirable side effect [x]  []  Trulicity can cause weight loss and decreased appetite. Although Mr. Duecker DM is very well controlled on Trulicity, I am concerned that it is hindering his nutrition status.    Adherence      Cannot afford medication []  []     Cannot self-administer medication appropriately []  []     Does not understand directions [x]  []  Per patient's wife, she will skip morning doses of all medications on the mornings of dialysis   Other pertinent pharmacist  counseling      Goals Addressed            This Visit's Progress   . "my diabetes is doing pretty good" (pt-stated)       Nurse Case Manager Clinical Goal(s): Over the next 30 days patient will continue to report fasting blood glucose readings below 140  Interventions:  . Commended patient/wife on dietary discretions and maintaining control of diabetes  . Encouraged continued monitoring and medication compliance   Patient Self Care Activities: ex. Takes medications (independent), administers insulin (in progress), checks cbg daily (independent) . Patient/wife will continue to check cbg dialy  Please see past updates related to this goal by clicking on the "Past Updates" button in the selected  goal   Pharmacist Clinical Goal(s): Over the next 30 days, patient, pharmacist, and provider, will work together to optimize diabetes pharmacotherapy to a plan that controls CBG but doesn't contribute to weight loss, feelings of satiety, or other GI side effects.   Interventions: . Recommend switching from Trulicity (a GLP-1 medication that can cause weight loss and lack of appetite) to pioglitazone (no renal adjustment)  Patient Self Care Activities: ex. Takes medications (independent), administers insulin (in progress), checks cbg daily (independent) . Continue checking CBGs daily . Take all medications as prescribed  Please see past updates related to this goal by clicking on the "Past Updates" button in  the selected goal        . "we really need assistance with transportation to and from dialysis" (pt-stated)       Nurse Case Manager Clinical Goal(s): Over the next two weeks, patient/wife will verbalize engagement with [employer] transportation for dialysis/medical appointments Interventions:  Marland Kitchen Verified with patients [facility] Medicare plan that needed transportation IS NOT covered . Collaborated with Celest at [facility] for transportation assistance via [employer]   Patient Self Care Activities: ex. Takes medications (independent), administers insulin (in progress), checks cbg daily (independent) . Patient/wife will continue to utilize [place] transportation on Saturdays and engage [employer] for all other transportation  Please see past updates related to this goal by clicking on the "Past Updates" button in the selected goal      . I would like to work on walking again (pt-stated)       Nurse Case Manager Clinical Goal(s): Over the next 14 days, patient/wife will verbalize importance of utilizing nutritional supplementation for malnutrition and muscle support  Over the next 14 days, patient/wife will verbalize engaging in home exercises prescribed by [facility] PT  Interventions:  . Discussed  importance of nutritional supplementation and effects on muscle strength and endurance . Encouraged patient/wife to discuss appropriate nutritional supplement (Nepro vs Community education officer) with dialysis team   Encouraged patient to consume currently prescribed supplement daily and increase to twice daily if approved by nephrologist   Encouraged patient/wife to participate in [facility] PT exercises as prescribed to support muscle gain and strengthy   Patient Self Care Activities: ex. Takes medications (independent), administers insulin (in progress), checks cbg daily (independent) . Patient will consume nutritional supplements as prescribe . Patient will continue to work with in home PT and work on prescribed activities/exercise independently  Please see past updates related to this goal by clicking on the "Past Updates" button in the selected goal          Time:  Time spent counseling patient: 47  Additional time spent on charting: 10  Plan: Recommendations discussed with provider - DC Trulicity - Add pioglitazone 40m once daily   JRuben Reason PharmD Clinical Pharmacist [phone]

## 2018-01-30 NOTE — Chronic Care Management (AMB) (Signed)
Chronic Care Management   Initial Visit Note  01/30/2018 Name: Joseph Newman MRN: [id number] DOB: 02-Jan-1946  Referred by: Steele Sizer, MD Reason for referral : Chronic Care Management (initial face to face)   Subjective: "I have been paying over 200.00 a week for transportation to dialysis" "I was able to walk, go out into the garage and drive before I went to the hospital"  Objective:  BP Readings from Last 3 Encounters:  01/30/18 120/72  01/20/18 (!) 161/70  01/09/18 122/78   Lab Results  Component Value Date   HGBA1C 7.3 (H) 09/29/2017    Assessment: [name] is a 73 y.o.year old male who sees Dr. Laure Kidney primary care. Dr. Ancil Boozer asked the CCM team to consult the patient for assistance with chronic disease management related totransportation needs. Patient has a medical history of HTN, COPD, DM with A1C 7.3, Nephropathy, CKD requiring dialysis. Referral was placed12/18/19. Today the CCM Team met with patient and wife to establish needs and healthcare goals.  Goals Addressed    . "my diabetes is doing pretty good" (pt-stated)       Ms. Fetterolf is patients healthcare advocate and caregiver since his hospitalization 09/2017 for which he had an extended stay for sepsis. She continues to check his sugars daily and upon meter review today, fasting sugars since 12/23/17 have ranged from 84-141. He has been taken off insulin and remains on Trulicity. Malnutrition is now a complication of his critical illness and high caloric foods is the focus while maintaining a renal and diabetic diet. Last A1C as documented above.  Nurse Case Manager Clinical Goal(s): Over the next 30 days patient will continue to report fasting blood glucose readings below 140  Interventions:  . Commended patient/wife on dietary discretions and maintaining control of diabetes  . Encouraged continued monitoring and medication compliance   Patient Self Care Activities: ex. Takes medications  (independent), administers insulin (in progress), checks cbg daily (independent) . Patient/wife will continue to check cbg dialy  Please see past updates related to this goal by clicking on the "Past Updates" button in the selected goal   Pharmacist Clinical Goal(s): Over the next 30 days, patient, pharmacist, and provider, will work together to optimize diabetes pharmacotherapy to a plan that controls CBG but doesn't contribute to weight loss, feelings of satiety, or other GI side effects.   Interventions: . Recommend switching from Trulicity (a GLP-1 medication that can cause weight loss and lack of appetite) to pioglitazone (no renal adjustment)  Patient Self Care Activities: ex. Takes medications (independent), administers insulin (in progress), checks cbg daily (independent) . Continue checking CBGs daily . Take all medications as prescribed     . "we really need assistance with transportation to and from dialysis" (pt-stated)       Ms. Frier is paying over 200.00 a week for [facility] transportation to and from dialysis. She pays 70.00 for transportation to and from each MD appointment. Upon discharge from [facility], she was told she would be given information on [place] transportation but never received any information. She remained consistant with Joseph Newman post SNF discharge because Joseph Newman provided transportation to dialysis from SNF. Unfortunately, patients [facility] plan will not cover transportation that is needed. They will provided non-emergency ambulance transportation for a 75.00 copay each use. CCM RN CM spoke with Celest at [facility] dialysis at [place] who will arrange medical transportation through [place]. Wife and patient informed and grateful for assistance.   Nurse Case Manager Clinical  Goal(s): Over the next two weeks, patient/wife will verbalize engagement with [employer] transportation for dialysis/medical appointments Interventions:  Marland Kitchen Verified with patients [facility] Medicare plan  that needed transportation IS NOT covered . Collaborated with Celest at [facility] for transportation assistance via [employer]   Patient Self Care Activities: ex. Takes medications (independent), administers insulin (in progress), checks cbg daily (independent) . Patient/wife will continue to utilize [place] transportation on Saturdays and engage [employer] for all other transportation  Please see past updates related to this goal by clicking on the "Past Updates" button in the selected goal     . I would like to work on walking again (pt-stated)       Per patient and wife, [name] was ambulating independently prior to his hospitalization in September. He was also driving. He has become severely deconditioned, malnurished and wheelchair bound. There is a lift in the home that wife has been using for transfer, however with the help of PT patient is now able to stand and pivot. This is difficult for him post dialysis. PT has extended sessions.  Nurse Case Manager Clinical Goal(s): Over the next 14 days, patient/wife will verbalize importance of utilizing nutritional supplementation for malnutrition and muscle support  Over the next 14 days, patient/wife will verbalize engaging in home exercises prescribed by [facility] PT  Interventions:  . Discussed importance of nutritional supplementation and effects on muscle strength and endurance . Encouraged patient/wife to discuss appropriate nutritional supplement (Nepro vs Community education officer) with dialysis team  . Encouraged patient to consume currently prescribed supplement daily and increase to twice daily if approved by nephrologist . Encouraged patient/wife to participate in [facility] PT exercises as prescribed to support muscle gain and strength  Patient Self Care Activities: ex. Takes medications (independent), administers insulin (in progress), checks cbg daily (independent) . Patient will consume nutritional supplements as prescribe . Patient will continue to  work with in home PT and work on prescribed activities/exercise independently  Please see past updates related to this goal by clicking on the "Past Updates" button in the selected goal       Plan: CCM Team will follow up with patient in 2 weeks   Mr. Hudon was given information about Chronic Care Management services today including:  1. CCM service includes personalized support from designated clinical staff supervised by his physician, including individualized plan of care and coordination with other care providers 2. 24/7 contact phone numbers for assistance for urgent and routine care needs. 3. Service will only be billed when office clinical staff spend 20 minutes or more in a month to coordinate care. 4. Only one practitioner may furnish and bill the service in a calendar month. 5. The patient may stop CCM services at any time (effective at the end of the month) by phone call to the office staff. 6. The patient will be responsible for cost sharing (co-pay) of up to 20% of the service fee (after annual deductible is met).  Patient agreed to services and verbal consent obtained.    Consepcion Utt E. Rollene Rotunda, RN, BSN Nurse Care Coordinator [facility] / [facility] Care Management  [phone]

## 2018-01-30 NOTE — Progress Notes (Signed)
Name: Joseph Newman   MRN: [id number]    DOB: 1945-04-01   Date:01/30/2018       Progress Note  Subjective  Chief Complaint  Chief Complaint  Patient presents with  . Follow-up    ER for UTI , having hard time urinating    HPI  Pt presents to follow up on ER visit on 01/19/2018. He is here with his wife who contributes to his history.  He was diagnosed with acute cystitis and given IV Meropenem and sent home with PO macrobid and flomax.  He has been taking flomax, but ran out, completed the macrobid.  He denies dysuria or hematuria, fatigue is at baseline, no confusion, fevers, chills, flank/abdominal pain.  He is ESRD on HD - still makes urine daily.  He has ongoing incontinence since hospitalization in September 2019 and uses adult diapers, so it is difficult for he and his wife to determine if he is making about the same amount of urine as usual.    Patient Active Problem List   Diagnosis Date Noted  . End stage renal disease ([place]) 12/31/2017  . Chronic gouty arthritis 10/22/2017  . Hyperlipidemia associated with type 2 diabetes mellitus ([place]) 09/06/2017  . Chronic kidney disease (CKD), stage V ([place]) 07/26/2017  . Dyslipidemia associated with type 2 diabetes mellitus ([place]) 03/13/2016  . Diabetic ulcer of toe of right foot associated with type 2 diabetes mellitus, limited to breakdown of skin ([place]) 03/07/2016  . Type 2 diabetes mellitus with diabetic nephropathy, without long-term current use of insulin ([place]) 06/17/2015  . Microscopic hematuria 05/30/2015  . 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 Dyslipidemia 08/31/2014  . Diabetes mellitus with renal manifestation ([place]) 08/31/2014  . COPD (chronic obstructive pulmonary disease) ([place]) 08/31/2014  . Microalbuminuria 08/31/2014  . ED (erectile dysfunction) 08/31/2014  . Hypertension     Social History   Tobacco Use  . Smoking status: Former Smoker    Packs/day: 0.25    Years: 30.00    Pack years: 7.50    Types: Cigarettes, Cigars    Last attempt to quit: 07/23/2017    Years since quitting: 0.5  . Smokeless tobacco: Never Used  Substance Use Topics  . Alcohol use: Yes    Alcohol/week: 0.0 standard drinks    Comment: occasional     Current Outpatient Medications:  .  allopurinol (ZYLOPRIM) 100 MG tablet, Take 0.5 tablets (50 mg total) by mouth every other day., Disp: 30 tablet, Rfl: 2 .  aspirin EC 81 MG tablet, Take 81 mg by mouth daily., Disp: , Rfl:  .  atorvastatin (LIPITOR) 40 MG tablet, Take 1 tablet (40 mg total) by mouth daily., Disp: 90 tablet, Rfl: 0 .  Blood Glucose Monitoring Suppl (ONE TOUCH ULTRA SYSTEM KIT) W/DEVICE KIT, 1 kit by Does not apply route once., Disp: , Rfl:  .  calcitRIOL (ROCALTROL) 0.25 MCG capsule, Take 0.25 mcg by mouth daily. , Disp: , Rfl:  .  carvedilol (COREG) 6.25 MG tablet, Take 6.25 mg by mouth 2 (two) times daily with a meal. , Disp: , Rfl:  .  Cholecalciferol (VITAMIN D) 2000 UNITS CAPS, Take 1 capsule by mouth daily. , Disp: , Rfl:  .  Cyanocobalamin (VITAMIN B-12) 1000 MCG SUBL, Take 1 tablet by mouth daily., Disp: , Rfl:  .  multivitamin (RENA-VIT) TABS tablet, Take 1 tablet by mouth at bedtime., Disp: 30 tablet, Rfl: 0 .  Nutritional Supplements (FEEDING SUPPLEMENT, NEPRO CARB STEADY,) LIQD,  Take 237 mLs by mouth 3 (three) times daily between meals., Disp: 90 Can, Rfl: 0 .  Olmesartan-amLODIPine-HCTZ 40-10-25 MG TABS, Take 1 tablet by mouth daily., Disp: 30 tablet, Rfl: 0 .  ondansetron (ZOFRAN ODT) 4 MG disintegrating tablet, Take 1 tablet (4 mg total) by mouth every 8 (eight) hours as needed for nausea or vomiting., Disp: 20 tablet, Rfl: 0 .  TRULICITY 1.5 ZO/1.0RU SOPN, , Disp: , Rfl:  .  acetaminophen (TYLENOL) 325 MG tablet, Take 2 tablets (650 mg total) by mouth every 6 (six) hours as needed for mild pain (or Fever >/= 101). (Patient not taking: Reported on 12/31/2017), Disp: , Rfl:  .  fluticasone (FLONASE) 50 MCG/ACT  nasal spray, Place 2 sprays into both nostrils daily. (Patient not taking: Reported on 01/09/2018), Disp: 16 g, Rfl: 0 .  OXYGEN, Inhale into the lungs. 2. liters, Disp: , Rfl:  .  tamsulosin (FLOMAX) 0.4 MG CAPS capsule, Take 0.4 mg by mouth daily after breakfast. , Disp: , Rfl:   No Known Allergies  I personally reviewed active problem list, medication list, allergies, notes from last encounter, lab results with the patient/caregiver today.  ROS  Constitutional: Negative for fever or weight change.  Respiratory: Negative for cough and shortness of breath.   Cardiovascular: Negative for chest pain or palpitations.  Gastrointestinal: Negative for abdominal pain, no bowel changes.  Musculoskeletal: Negative for gait problem or joint swelling.  Skin: Negative for rash.  Neurological: Negative for dizziness or headache.  No other specific complaints in a complete review of systems (except as listed in HPI above).  Objective  Vitals:   01/30/18 1008  BP: 120/72  Pulse: 90  Resp: 18  Temp: 99.3 F (37.4 C)  TempSrc: Oral  SpO2: 99%  Weight: 195 lb (88.5 kg)  Height: _0  (1.956 m)   Body mass index is 23.12 kg/m.  Nursing Note and Vital Signs reviewed.  Physical Exam  Constitutional: Patient appears well-developed and well-nourished. No distress.  HENT: Head: Normocephalic and atraumatic.   Eyes: Conjunctivae and EOM are normal. No scleral icterus.  Neck: Normal range of motion. Neck supple. No JVD present. No thyromegaly present.  Cardiovascular: Normal rate, regular rhythm and normal heart sounds.  No murmur heard. No BLE edema. Pulmonary/Chest: Effort normal and breath sounds normal. No respiratory distress. Abdominal: Soft. Bowel sounds are normal, no distension. There is no tenderness. No masses. No CVA tenderness. Musculoskeletal: Normal range of motion, no joint effusions. No gross deformities Neurological: Pt is alert and oriented to person, place, and time. He is  in wheelchair for entirety of examination. Answers questions appropriately. Skin: Skin is warm and dry. No rash noted. No erythema.  Psychiatric: Patient has a normal mood and affect. behavior is normal. Judgment and thought content normal.  No results found for this or any previous visit (from the past 72 hour(s)).  Assessment & Plan  1. Acute cystitis without hematuria - tamsulosin (FLOMAX) 0.4 MG CAPS capsule; Take 1 capsule (0.4 mg total) by mouth daily after breakfast.  Dispense: 30 capsule; Refill: 0 - If not able to collect urine sample in 1 week to recheck for resolution and if still symptomatic and unable to obtain specimen, we will refer to urology. - Urine Culture; Future - Urine Culture  2. End stage renal disease ([place]) 3. 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 [facility]) Continue dialysis as usual at this time.  Monitor for decreased urine output.

## 2018-01-30 NOTE — Patient Instructions (Addendum)
1. 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   2. Ask tomorrow at dialysis about the [employer] brand protein shakes and alternative protein bars- are these nutrients that interact with dialysis  A safe protein shake for dialysis is called "Nepro" and available at [facility]  3. Await for [place] from dialysis to call with [employer] transportation information  4. Continue to work with PT and participate in your prescribed exercise independently as tolerated  5. Continue to take all medications as prescribed and notify the CCM Team if you have any financial hardships that would prevent you from purchasing your medication.  6. Call [employer] for possible coupons for Nepro if needed [phone]  7. Information provided below on [employer] for comparing different health plans during Medicare Open Enrollment:   [place] [facility] ([place])  [address] [place]  [address] [phone] or toll free [phone]   Protein-Energy Malnutrition Protein-energy malnutrition is when a person does not eat enough protein, fat, and calories. When this happens over time, it can lead to severe loss of muscle tissue (muscle wasting). This condition also affects the body's defense system (immune system) and can lead to other health problems. What are the causes? This condition may be caused by:  Not eating enough protein, fat, or calories.  Having certain chronic medical conditions.  Eating too little. What increases the risk? The following factors may make you more likely to develop this condition:  Living in poverty.  Long-term hospitalization.  Alcohol or drug dependency. Addiction often leads to a lifestyle in which proper diet is ignored. Dependency can also hurt the metabolism and the body's ability to absorb nutrients.  Eating disorders, such  as anorexia nervosa or bulimia.  Chewing or swallowing problems. People with these disorders may not eat enough.  Having certain conditions, such as: ? Inflammatory bowel disease. Inflammation of the intestines makes it difficult for the body to absorb nutrients. ? Cancer or AIDS. These diseases can cause a loss of appetite. ? Chronic heart failure. This interferes with how the body uses nutrients. ? Cystic fibrosis. This disease can make it difficult for the body to absorb nutrients.  Eating a diet that extremely restricts protein, fat, or calorie intake. What are the signs or symptoms? Symptoms of this condition include:  Fatigue.  Weakness.  Dizziness.  Fainting.  Weight loss.  Loss of muscle tone and muscle mass.  Poor immune response.  Lack of menstruation.  Poor memory.  Hair loss.  Skin changes. How is this diagnosed? This condition may be diagnosed based on:  Your medical and dietary history.  A physical exam. This may include a measurement of your body mass index (BMI).  Blood tests. How is this treated? This condition may be managed with:  Nutrition therapy. This may include working with a diet and nutrition specialist (dietitian).  Treatment for underlying conditions. People with severe protein-energy malnutrition may need to be treated in a hospital. This may involve receiving nutrition and fluids through an IV. Follow these instructions at home:   Eat a balanced diet. In each meal, include at least one food that is high in protein. Foods that are high in protein include: ? Meat. ? Poultry. ? Fish. ? Eggs. ? Cheese. ? Milk. ? Beans. ? Nuts.  Eat nutrient-rich foods that are easy to swallow and digest, such as: ?  Fruit and yogurt smoothies. ? Oatmeal with nut butter.  Try to eat six small meals each day instead of three large meals.  Take vitamin and protein supplements as told by your health care provider or dietitian.  Follow your  health care provider's recommendations about exercise and activity.  Keep all follow-up visits as told by your health care provider. This is important. Contact a health care provider if you:  Have increased weakness or fatigue.  Faint.  Are a woman and you stop having your period (menstruating).  Have rapid hair loss.  Have unexpected weight loss.  Have diarrhea.  Have nausea and vomiting. Get help right away if you have:  Difficulty breathing.  Chest pain. Summary  Protein-energy malnutrition is when a person does not eat enough protein, fat, and calories.  Protein-energy malnutrition can lead to severe loss of muscle tissue (muscle wasting). This condition also affects the body's defense system (immune system) and can lead to other health problems.  Talk with your health care provider about treatment for this condition. Effective treatment depends on the underlying cause of the malnutrition. This information is not intended to replace advice given to you by your health care provider. Make sure you discuss any questions you have with your health care provider. Document Released: 11/25/2004 Document Revised: 01/24/2017 Document Reviewed: 01/24/2017 Elsevier Interactive Patient Education  2019 [employer].

## 2018-01-31 ENCOUNTER — Other Ambulatory Visit: Payer: Self-pay

## 2018-01-31 ENCOUNTER — Emergency Department: Payer: Medicare Other

## 2018-01-31 ENCOUNTER — Encounter: Payer: Self-pay | Admitting: Emergency Medicine

## 2018-01-31 ENCOUNTER — Emergency Department
Admission: EM | Admit: 2018-01-31 | Discharge: 2018-01-31 | Disposition: A | Discharge status: Home / Self Care | Payer: Medicare Other | Attending: Emergency Medicine | Admitting: Emergency Medicine

## 2018-01-31 DIAGNOSIS — Z7982 Long term (current) use of aspirin: Secondary | ICD-10-CM | POA: Insufficient documentation

## 2018-01-31 DIAGNOSIS — R009 Unspecified abnormalities of heart beat: Secondary | ICD-10-CM | POA: Diagnosis present

## 2018-01-31 DIAGNOSIS — Z992 Dependence on renal dialysis: Secondary | ICD-10-CM | POA: Diagnosis not present

## 2018-01-31 DIAGNOSIS — R000 Tachycardia, unspecified: Secondary | ICD-10-CM

## 2018-01-31 DIAGNOSIS — J449 Chronic obstructive pulmonary disease, unspecified: Secondary | ICD-10-CM | POA: Insufficient documentation

## 2018-01-31 DIAGNOSIS — I120 Hypertensive chronic kidney disease with stage 5 chronic kidney disease or end stage renal disease: Secondary | ICD-10-CM | POA: Insufficient documentation

## 2018-01-31 DIAGNOSIS — I471 Supraventricular tachycardia: Secondary | ICD-10-CM | POA: Insufficient documentation

## 2018-01-31 DIAGNOSIS — Z79899 Other long term (current) drug therapy: Secondary | ICD-10-CM | POA: Diagnosis not present

## 2018-01-31 DIAGNOSIS — E1122 Type 2 diabetes mellitus with diabetic chronic kidney disease: Secondary | ICD-10-CM | POA: Diagnosis not present

## 2018-01-31 DIAGNOSIS — Z87891 Personal history of nicotine dependence: Secondary | ICD-10-CM | POA: Insufficient documentation

## 2018-01-31 DIAGNOSIS — Z7984 Long term (current) use of oral hypoglycemic drugs: Secondary | ICD-10-CM | POA: Insufficient documentation

## 2018-01-31 DIAGNOSIS — N185 Chronic kidney disease, stage 5: Secondary | ICD-10-CM | POA: Insufficient documentation

## 2018-01-31 LAB — TROPONIN I: Troponin I: <0.03 ng/mL (ref <0.03)

## 2018-01-31 LAB — CBC WITH DIFFERENTIAL/PLATELET
Abs Immature Granulocytes: 0.04 10*3/uL (ref 0.00–0.07)
BASOS ABS: 0.1 10*3/uL (ref 0.0–0.1)
Basophils Relative: 1 %
Eosinophils Absolute: 0.2 10*3/uL (ref 0.0–0.5)
Eosinophils Relative: 2 %
HCT: 37.1 % — ABNORMAL LOW (ref 39.0–52.0)
Hemoglobin: 11.8 g/dL — ABNORMAL LOW (ref 13.0–17.0)
Immature Granulocytes: 0 %
Lymphocytes Relative: 33 %
Lymphs Abs: 3 10*3/uL (ref 0.7–4.0)
MCH: 26.6 pg (ref 26.0–34.0)
MCHC: 31.8 g/dL (ref 30.0–36.0)
MCV: 83.7 fL (ref 80.0–100.0)
MONOS PCT: 9 %
Monocytes Absolute: 0.8 10*3/uL (ref 0.1–1.0)
Neutro Abs: 5 10*3/uL (ref 1.7–7.7)
Neutrophils Relative %: 55 %
Platelets: 249 10*3/uL (ref 150–400)
RBC: 4.43 MIL/uL (ref 4.22–5.81)
RDW: 15.7 % — ABNORMAL HIGH (ref 11.5–15.5)
WBC: 9.1 10*3/uL (ref 4.0–10.5)
nRBC: 0 % (ref 0.0–0.2)

## 2018-01-31 LAB — BASIC METABOLIC PANEL
Anion gap: 10 (ref 5–15)
BUN: 15 mg/dL (ref 8–23)
CALCIUM: 8.8 mg/dL — AB (ref 8.9–10.3)
CO2: 24 mmol/L (ref 22–32)
Chloride: 101 mmol/L (ref 98–111)
Creatinine, Ser: 2.53 mg/dL — ABNORMAL HIGH (ref 0.61–1.24)
GFR calc Af Amer: 28 mL/min — ABNORMAL LOW (ref >60)
GFR calc non Af Amer: 24 mL/min — ABNORMAL LOW (ref >60)
Glucose, Bld: 96 mg/dL (ref 70–99)
Potassium: 3.9 mmol/L (ref 3.5–5.1)
Sodium: 135 mmol/L (ref 135–145)

## 2018-01-31 MED ORDER — CARVEDILOL 6.25 MG PO TABS
6.2500 mg | ORAL_TABLET | Freq: Once | ORAL | Status: AC
  Administered 2018-01-31: 6.25 mg via ORAL
  Filled 2018-01-31: qty 1

## 2018-01-31 NOTE — ED Notes (Signed)
In and out cath was done, no urine was attempted. Provider made aware.

## 2018-01-31 NOTE — ED Notes (Signed)
Dr. Clearnce Hasten to room to assess pt, states only EKG for now.

## 2018-01-31 NOTE — ED Provider Notes (Signed)
  [facility] Emergency Department Provider Note  ____________________________________________   First MD Initiated Contact with Patient 01/31/18 1508     (approximate)  I have reviewed the triage vital signs and the nursing notes.  HISTORY  Chief Complaint Irregular Heart Beat    HPI Joseph Newman is a 73 y.o. male with a history of CKD on dialysis was presented emergency department with a "sinus tachycardia."  Per the patient, he has no complaints.  No palpitations, chest pain, weakness, numbness, abdominal pain, nausea vomiting or fever.  Patient was at dialysis today and had an incomplete session secondary to him being tachycardic.  He was sent to the emergency department for further evaluation.   Past Medical History:  Diagnosis Date  . Acute metabolic encephalopathy 12/14/2017   Per my chart  . Arthritis   . Chronic airway obstruction, not elsewhere classified   . 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 Lateef, nephrologist  . Depressive disorder, not elsewhere classified   . Gout   . Heart murmur   . Hyperlipidemia   . Hypertension   . Hypertrophy of prostate without urinary obstruction and other lower urinary tract symptoms (LUTS)   . Impotence of organic origin   . Microalbuminuria   . 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 Obesity, unspecified   . Plantar wart   . Sleep apnea   . Synovitis and tenosynovitis, unspecified   . Type II or unspecified type diabetes mellitus without mention of complication, uncontrolled   . Unspecified venous (peripheral) insufficiency     Patient Active Problem List   Diagnosis Date Noted  . 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 End stage renal disease (HCC) 12/31/2017  . Chronic gouty arthritis 10/22/2017  . Hyperlipidemia associated with type 2 diabetes mellitus (HCC) 09/06/2017  . Chronic kidney disease (CKD), stage V (HCC) 07/26/2017  . Dyslipidemia  associated with type 2 diabetes mellitus (HCC) 03/13/2016  . Diabetic ulcer of toe of right foot associated with type 2 diabetes mellitus, limited to breakdown of skin (HCC) 03/07/2016  . Type 2 diabetes mellitus with diabetic nephropathy, without long-term current use of insulin (HCC) 06/17/2015  . Microscopic hematuria 05/30/2015  . 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 Dyslipidemia 08/31/2014  . Diabetes mellitus with renal manifestation (HCC) 08/31/2014  . COPD (chronic obstructive pulmonary disease) (HCC) 08/31/2014  . Microalbuminuria 08/31/2014  . ED (erectile dysfunction) 08/31/2014  . Hypertension     Past Surgical History:  Procedure Laterality Date  . DIALYSIS/PERMA CATHETER INSERTION N/A 09/28/2017   Procedure: DIALYSIS/PERMA CATHETER INSERTION;  Surgeon: Schnier, Gregory G, MD;  Location: [facility] INVASIVE CV LAB;  Service: Cardiovascular;  Laterality: N/A;  . foot surgery      Prior to Admission medications   Medication Sig Start Date End Date Taking? Authorizing Provider  acetaminophen (TYLENOL) 325 MG tablet Take 2 tablets (650 mg total) by mouth every 6 (six) hours as needed for mild pain (or Fever >/= 101). Patient not taking: Reported on 12/31/2017 11/07/17   Wieting, Richard, MD  allopurinol (ZYLOPRIM) 100 MG tablet Take 0.5 tablets (50 mg total) by mouth every other day. 09/30/17   Kalisetti, Radhika, MD  aspirin EC 81 MG tablet Take 81 mg by mouth daily.    [provider]  atorvastatin (LIPITOR) 40 MG tablet Take 1 tablet (40 mg total) by mouth daily. 01/11/18   Sowles, Krichna, MD  Blood Glucose Monitoring   Suppl (ONE TOUCH ULTRA SYSTEM KIT) W/DEVICE KIT 1 kit by Does not apply route once.    [provider]  calcitRIOL (ROCALTROL) 0.25 MCG capsule Take 0.25 mcg by mouth daily.  04/02/17   [provider]  carvedilol (COREG) 6.25 MG tablet Take 6.25 mg by mouth 2 (two) times daily with a meal.  04/05/15    Holley Raring, Munsoor, MD  cholecalciferol (VITAMIN D3) 25 MCG (1000 UT) tablet Take 1 capsule by mouth daily.     [provider]  Cyanocobalamin (VITAMIN B-12) 1000 MCG SUBL Take 1 tablet by mouth daily.    [provider]  fluticasone (FLONASE) 50 MCG/ACT nasal spray Place 2 sprays into both nostrils daily. Patient not taking: Reported on 01/09/2018 11/08/17   Loletha Grayer, MD  multivitamin (RENA-VIT) TABS tablet Take 1 tablet by mouth at bedtime. 11/07/17   Loletha Grayer, MD  Nutritional Supplements (FEEDING SUPPLEMENT, NEPRO CARB STEADY,) LIQD Take 237 mLs by mouth 3 (three) times daily between meals. Patient not taking: Reported on 01/30/2018 11/07/17   Loletha Grayer, MD  Olmesartan-amLODIPine-HCTZ 40-10-25 MG TABS Take 1 tablet by mouth daily. 01/11/18   Steele Sizer, MD  ondansetron (ZOFRAN ODT) 4 MG disintegrating tablet Take 1 tablet (4 mg total) by mouth every 8 (eight) hours as needed for nausea or vomiting. 12/30/17   Harvest Dark, MD  tamsulosin (FLOMAX) 0.4 MG CAPS capsule Take 1 capsule (0.4 mg total) by mouth daily after breakfast. Patient not taking: Reported on 01/30/2018 01/30/18   Hubbard Hartshorn, FNP  TRULICITY 1.5 CV/8.9FY [facility]  11/21/17   [provider]    Allergies Patient has no known allergies.  Family History  Problem Relation Age of Onset  . Hypertension Mother   . Kidney disease Neg Hx   . Prostate cancer Neg Hx     Social History Social History   Tobacco Use  . Smoking status: Former Smoker    Packs/day: 0.25    Years: 30.00    Pack years: 7.50    Types: Cigarettes, Cigars    Last attempt to quit: 07/23/2017    Years since quitting: 0.5  . Smokeless tobacco: Never Used  Substance Use Topics  . Alcohol use: Yes    Alcohol/week: 0.0 standard drinks    Comment: occasional  . Drug use: No    Review of Systems  Constitutional: No fever/chills Eyes: No visual changes. ENT: No sore throat. Cardiovascular: Denies  chest pain. Respiratory: Denies shortness of breath. Gastrointestinal: No abdominal pain.  No nausea, no vomiting.  No diarrhea.  No constipation. Genitourinary: Negative for dysuria. Musculoskeletal: Negative for back pain. Skin: Negative for rash. Neurological: Negative for headaches, focal weakness or numbness.   ____________________________________________   PHYSICAL EXAM:  VITAL SIGNS: ED Triage Vitals  Enc Vitals Group     BP 01/31/18 1510 127/75     Pulse Rate 01/31/18 1510 91     Resp 01/31/18 1510 19     Temp 01/31/18 1510 98.5 F (36.9 C)     Temp Source 01/31/18 1510 Oral     SpO2 01/31/18 1510 99 %     Weight 01/31/18 1507 201 lb 11.5 oz (91.5 kg)     Height 01/31/18 1507 6' 5" (1.956 m)     Head Circumference --      Peak Flow --      Pain Score 01/31/18 1507 0     Pain Loc --      Pain Edu? --  Excl. in GC? --     Constitutional: Alert and oriented. Well appearing and in no acute distress. Eyes: Conjunctivae are normal.  Head: Atraumatic. Nose: No congestion/rhinnorhea. Mouth/Throat: Mucous membranes are moist.  Neck: No stridor.   Cardiovascular: Normal rate, regular rhythm. Grossly normal heart sounds.  Right-sided chest permacath with CDI bandages. Respiratory: Normal respiratory effort.  No retractions. Lungs CTAB. Gastrointestinal: Soft and nontender. No distention. No CVA tenderness.  PD catheter in place with dressings CDI.  No surrounding erythema, induration or tenderness to palpation. Musculoskeletal: No lower extremity tenderness nor edema.  No joint effusions. Neurologic:  Normal speech and language. No gross focal neurologic deficits are appreciated. Skin:  Skin is warm, dry and intact. No rash noted. Psychiatric: Mood and affect are normal. Speech and behavior are normal.  ____________________________________________   LABS (all labs ordered are listed, but only abnormal results are displayed)  Labs Reviewed  CBC WITH  DIFFERENTIAL/PLATELET - Abnormal; Notable for the following components:      Result Value   Hemoglobin 11.8 (*)    HCT 37.1 (*)    RDW 15.7 (*)    All other components within normal limits  BASIC METABOLIC PANEL - Abnormal; Notable for the following components:   Creatinine, Ser 2.53 (*)    Calcium 8.8 (*)    GFR calc non Af Amer 24 (*)    GFR calc Af Amer 28 (*)    All other components within normal limits  TROPONIN I  TROPONIN I  URINALYSIS, COMPLETE (UACMP) WITH MICROSCOPIC   ____________________________________________  EKG  ED ECG REPORT I, David M Schaevitz, the attending physician, personally viewed and interpreted this ECG.   Date: 01/31/2018  EKG Time: 1536  Rate: 131  Rhythm: Junctional tachycardia  Axis: Normal  Intervals:Prolonged QTC at 539  ST&T Change: No ST segment elevation or depression.  Single biphasic T wave in aVL.  ED ECG REPORT I, David M Schaevitz, the attending physician, personally viewed and interpreted this ECG.   Date: 01/31/2018  EKG Time: 1508  Rate: 88  Rhythm: normal sinus rhythm  Axis: Normal  Intervals:none  ST&T Change: No ST segment elevation or depression.  No abnormal T wave inversion.  ____________________________________________  RADIOLOGY  Chest x-ray with cardiomegaly with some interstitial opacities.  Suspect chronic interstitial fibrosis and a component of interstitial edema. ____________________________________________   PROCEDURES  Procedure(s) performed:   Procedures  Critical Care performed:   ____________________________________________   INITIAL IMPRESSION / ASSESSMENT AND PLAN / ED COURSE  Pertinent labs & imaging results that were available during my care of the patient were reviewed by me and considered in my medical decision making (see chart for details).  Differential diagnosis includes, but is not limited to, ACS, aortic dissection, pulmonary embolism, cardiac tamponade, pneumothorax,  pneumonia, pericarditis, myocarditis, GI-related causes including esophagitis/gastritis, and musculoskeletal chest wall pain.   As part of my medical decision making, I reviewed the following data within the electronic medical record:  Notes from prior ED visits  Patient in and out of what appears to be a junctional tachycardia.  No history of A. fib or atrial flutter.  Also, patient is supposed to be taking Macrobid for UTI but has not started yet.  ----------------------------------------- 8:23 PM on 01/31/2018 ----------------------------------------- I discussed the case Dr. call would who recommended increasing carvedilol to 12.5 twice daily or possibly add Cardizem.  However, Patient with heart rate persistently in the 70s without any episodes of junctional tachycardia after normal daily dosing of   of carvedilol in the emergency department.  Patient will remain on his baseline carvedilol.  Reassuring cardiac enzymes.  Patient without any complaints at this time persistently.  I advised the patient and his family that if he has persistent palpitations that he may take an extra dose of carvedilol.  However, he should otherwise maintain his twice daily dosing and follow-up with his cardiologist, Dr. Clayborn Bigness.  Unable to obtain urine.  Patient was cathed but without any urine return.  Patient understanding of the diagnosis well treatment and willing to comply. ____________________________________________   FINAL CLINICAL IMPRESSION(S) / ED DIAGNOSES  Junctional tachycardia  NEW MEDICATIONS STARTED DURING THIS VISIT:  New Prescriptions   No medications on file     Note:  This document was prepared using Dragon voice recognition software and may include unintentional dictation errors.     Orbie Pyo, MD 01/31/18 2024

## 2018-01-31 NOTE — ED Notes (Signed)
Bladder scan 134 mL.

## 2018-01-31 NOTE — ED Triage Notes (Signed)
PT to ER from [facility] with reports of intermittent ST at 130s.  Pt has no c/o pain or other c/o at this time.

## 2018-02-01 ENCOUNTER — Telehealth (INDEPENDENT_AMBULATORY_CARE_PROVIDER_SITE_OTHER): Payer: Self-pay

## 2018-02-01 NOTE — Telephone Encounter (Signed)
-----   Message from Carlena Bjornstad sent at 02/01/2018  3:37 PM EST ----- Regarding: surgery Patients wife [name] called because they haven't heard anything about when his surgery will be scheduled.

## 2018-02-01 NOTE — Telephone Encounter (Signed)
I returned the call to the patient's wife, I asked if he has had everything that was needed a the cardiologist office. Patient's wife stated that he has had his stress test and Echo. I explained that a note was in the system from 01/18/18 for the patient to contact Dr. Etta Quill office. The patient's wife stated she would call them.         ---- Message from Carlena Bjornstad sent at 02/01/2018  3:37 PM EST ----- Regarding: surgery Patients wife [name] called because they haven't heard anything about when his surgery will be scheduled.

## 2018-02-01 NOTE — Telephone Encounter (Signed)
See below

## 2018-02-04 ENCOUNTER — Telehealth: Payer: Self-pay | Admitting: Family Medicine

## 2018-02-04 ENCOUNTER — Other Ambulatory Visit: Payer: Self-pay | Admitting: Family Medicine

## 2018-02-04 NOTE — Telephone Encounter (Signed)
Copied from [place] [id number]. Topic: Quick Communication - See Telephone Encounter >> Feb 04, 2018  3:23 PM Antonieta Iba C wrote: CRM for notification. See Telephone encounter for: 02/04/18.  [phone] -  Mrs. Rawson  Pt was seen last week. Mrs Treanor says that provider told them that they would follow up in 1 week. Spouse says that pt cant wait until then because he is in pain. Spouse says that provider sent home a urine cup to have specimen collect for possible UTI. Spouse says that pt is unable to give a urine specimen. Spouse would like to know if provider could send in something for UTI. Spouse says that she feels certain that it is a UTI.   Pharmacy: [id number] [place], [address] [phone] (Phone) [phone] (Fax)  Please advise.

## 2018-02-05 ENCOUNTER — Telehealth: Payer: Self-pay

## 2018-02-05 NOTE — Telephone Encounter (Signed)
Copied from [place] [id number]. Topic: Quick Communication - See Telephone Encounter >> Feb 04, 2018  3:23 PM Antonieta Iba C wrote: CRM for notification. See Telephone encounter for: 02/04/18.  [phone] -  Mrs. Korzeniewski  Pt was seen last week. Mrs Colquhoun says that provider told them that they would follow up in 1 week. Spouse says that pt cant wait until then because he is in pain. Spouse says that provider sent home a urine cup to have specimen collect for possible UTI. Spouse says that pt is unable to give a urine specimen. Spouse would like to know if provider could send in something for UTI. Spouse says that she feels certain that it is a UTI.   Pharmacy: [id number] - [place], [address] [phone] (Phone) [phone] (Fax)  Please advise. >> Feb 05, 2018  8:37 AM Karene Fry P wrote: Wife just dropped off a urine sample. It is in the [facility]

## 2018-02-05 NOTE — Telephone Encounter (Signed)
Please send for culture

## 2018-02-05 NOTE — Telephone Encounter (Signed)
Sorry I cannot send antibiotics without knowing what we are treating. Can he see Urologist and have a straight cath done to collect urine? Urgent care may be able to do that also, or at the HD center? We cannot do it in our office

## 2018-02-05 NOTE — Telephone Encounter (Signed)
Refill request for general medication: Allopurinol 100 mg  Last office visit: 01/30/2018  Last physical exam: None indicated  Follow-ups on file. 02/14/2018

## 2018-02-06 ENCOUNTER — Ambulatory Visit: Payer: Self-pay

## 2018-02-06 NOTE — Telephone Encounter (Signed)
Joseph Newman, PTA with [facility] called to report that when he arrived, the patient's wife told him the patient fell coming out of the bathroom around 0730 this morning. He says there are no injuries. He says he called to report his vital signs taken at 1010, BP 85/60 P 120. I asked has he taken his medications this morning, wife says yes. Joseph Newman says the wife reports that on Saturday while the patient was in dialysis, they sent him to [facility] ED because his heart rate was up, but nothing was wrong, so the ED sent him home. I asked was the patient dizzy, did he pass out, what happened with the fall. He asked and the wife says he just lost his balance, but did not have dizziness or faint. I asked is the patient having any symptoms now, he asked and the patient says no. I asked him to recheck the BP/P, it was checked again at 1023, BP 96/65, P88. Joseph Newman says he will do a light physical therapy session with the patient and the patient looked fine, patient denies weakness, blurry vision, dizziness at present. I advised I will send to Dr. Ancil Boozer and if she has recommendations, someone will call the patient.   Answer Assessment - Initial Assessment Questions 1. BLOOD PRESSURE: "What is the blood pressure?" "Did you take at least two measurements 5 minutes apart?"     85/60, 96/65 2. ONSET: "When did you take your blood pressure?"    1010 and 1023 3. HOW: "How did you obtain the blood pressure?" (e.g., visiting nurse, automatic home BP monitor)     Visiting Physical Therapist automatic machine 4. HISTORY: "Do you have a history of low blood pressure?" "What is your blood pressure normally?"     N/A 5. MEDICATIONS: "Are you taking any medications for blood pressure?" If yes: "Have they been changed recently?"     Yes, took them this morning 6. PULSE RATE: "Do you know what your pulse rate is?"      120, now 88 7. OTHER SYMPTOMS: "Have you been sick recently?" "Have you had a recent injury?"     Fall this  morning at 0730, did not get hurt 8. PREGNANCY: "Is there any chance you are pregnant?" "When was your last menstrual period?"     N/A  Protocols used: LOW BLOOD PRESSURE-A-AH

## 2018-02-06 NOTE — Telephone Encounter (Signed)
Spoke with wife and informed her per [facility] the Urine Culture has not resulted yet. However, when we get his results we will call them with the outcome of the UC.

## 2018-02-06 NOTE — Telephone Encounter (Signed)
Patient wife is calling with a update on the patient Urine culture. Please advise

## 2018-02-07 ENCOUNTER — Encounter (INDEPENDENT_AMBULATORY_CARE_PROVIDER_SITE_OTHER): Payer: Self-pay

## 2018-02-07 LAB — URINE CULTURE
MICRO NUMBER:: 54976
SPECIMEN QUALITY:: ADEQUATE

## 2018-02-12 ENCOUNTER — Telehealth: Payer: Self-pay | Admitting: Family Medicine

## 2018-02-12 ENCOUNTER — Other Ambulatory Visit: Payer: Self-pay | Admitting: Family Medicine

## 2018-02-12 DIAGNOSIS — N186 End stage renal disease: Secondary | ICD-10-CM

## 2018-02-12 DIAGNOSIS — R3916 Straining to void: Secondary | ICD-10-CM

## 2018-02-12 NOTE — Telephone Encounter (Signed)
Just spoke to patients wife and shes requesting for Dr Ancil Boozer to call her directly. Pts wife states, pt is in a wheelchair and transportation is a issue for them.

## 2018-02-12 NOTE — Telephone Encounter (Signed)
Copied from [place] [id number]. Topic: Referral - Request for Referral >> Feb 12, 2018 11:05 AM Margot Ables wrote: Has patient seen PCP for this complaint? No - pt was at dialysis and Dr. Candiss Norse advised to call PCP *If NO, is insurance requiring patient see PCP for this issue before PCP can refer them? Referral for which specialty: Urology Preferred provider/office: local Reason for referral: pt having difficulty urinating, Dr. Candiss Norse (nephrologist) advised to have PCP refer to Urology, Dr. Candiss Norse did prescribe an antibiotic per [name]  Pt has dialysis is Tuesday, Thursday, and Saturday. Pt cannot go this Friday 1/24 or next Friday 1/31 either (has appointments)

## 2018-02-12 NOTE — Telephone Encounter (Signed)
Referral was placed, please notify patient

## 2018-02-12 NOTE — Telephone Encounter (Signed)
Please call and schedule appointment when it is convenient with patient.

## 2018-02-13 NOTE — Telephone Encounter (Signed)
Patient called.  Patient aware.  

## 2018-02-14 ENCOUNTER — Ambulatory Visit: Payer: Self-pay

## 2018-02-14 ENCOUNTER — Other Ambulatory Visit (INDEPENDENT_AMBULATORY_CARE_PROVIDER_SITE_OTHER): Payer: Self-pay | Admitting: Nurse Practitioner

## 2018-02-14 DIAGNOSIS — E1129 Type 2 diabetes mellitus with other diabetic kidney complication: Secondary | ICD-10-CM | POA: Diagnosis not present

## 2018-02-14 DIAGNOSIS — N183 Chronic kidney disease, stage 3 (moderate): Secondary | ICD-10-CM

## 2018-02-14 DIAGNOSIS — I129 Hypertensive chronic kidney disease with stage 1 through stage 4 chronic kidney disease, or unspecified chronic kidney disease: Secondary | ICD-10-CM | POA: Diagnosis not present

## 2018-02-14 DIAGNOSIS — N186 End stage renal disease: Secondary | ICD-10-CM | POA: Diagnosis not present

## 2018-02-14 DIAGNOSIS — R809 Proteinuria, unspecified: Secondary | ICD-10-CM

## 2018-02-14 DIAGNOSIS — E1122 Type 2 diabetes mellitus with diabetic chronic kidney disease: Secondary | ICD-10-CM | POA: Diagnosis not present

## 2018-02-14 NOTE — Chronic Care Management (AMB) (Signed)
Chronic Care Management   Follow Up Note   02/14/2018 Name: Joseph Newman MRN: [id number] DOB: Jan 31, 1945  Referred by: Steele Sizer, MD Reason for referral : Chronic Care Management (telephone follow up)   Subjective: "We are doing ok but he has taken a decline with his physical activity since he has had this UTI. Yesterday he did not get out of bed all day but today I was able to get him into the wheelchair" "Palliative care called me yesterday and asked if I needed anything but I told them we were OK right not"   Objective:   <no information>   Assessment: [name] a 89R.o.year oldmale who seesDr. Laure Newman primary care.Dr. Lenox Newman the CCM team to consult the patient for assistance with chronic disease management related totransportation needs.Patient has a medical history of HTN, COPD, DM with A1C 7.3, Nephropathy, CKD requiring dialysis.Referral was placed12/18/19. Today CCM RN CM followed up with patient's wife to discuss recent fall, UTI, and goal progression.  Goals Addressed    . "my diabetes is doing pretty good" (pt-stated)       Nurse Case Manager Clinical Goal(s): Over the next 30 days patient will continue to report fasting blood glucose readings below 140  Interventions:  . Commended patient/wife on dietary discretions and maintaining control of diabetes  . Encouraged continued monitoring and medication compliance   Patient Self Care Activities: ex. Takes medications (independent), administers insulin (in progress), checks cbg daily (independent) . Patient/wife will continue to check cbg dialy  Please see past updates related to this goal by clicking on the "Past Updates" button in the selected goal   Pharmacist Clinical Goal(s): Over the next 30 days, patient, pharmacist, and provider, will work together to optimize diabetes pharmacotherapy to a plan that controls CBG but doesn't contribute to weight loss, feelings of satiety, or other  GI side effects.   Interventions: . Recommend switching from Trulicity (a GLP-1 medication that can cause weight loss and lack of appetite) to pioglitazone (no renal adjustment)  02/14/2018 Encouraged wife make follow up appointment with endocrinologist to discuss possible transition from Trulicity to a medication that does not cause weight loss.  Patient Self Care Activities: ex. Takes medications (independent), administers insulin (in progress), checks cbg daily (independent) . Continue checking CBGs daily . Take all medications as prescribed  Please see past updates related to this goal by clicking on the "Past Updates" button in the selected goal        . COMPLETED: "we really need assistance with transportation to and from dialysis" (pt-stated)       Ms. Joseph Newman has since engaged with [employer] and utilizing this transportation for [name] M-F. She continues to utilize [name] for his Saturday dialysis appointment but is extremely grateful that she no longer has to pay over 200.00 a week for transportation.  Nurse Case Manager Clinical Goal(s): Over the next two weeks, patient/wife will verbalize engagement with [employer] transportation for dialysis/medical appointments Interventions:  Marland Kitchen Verified with patients [facility] Medicare plan that needed transportation IS NOT covered . Collaborated with Celest at [facility] for transportation assistance via [employer]   Patient Self Care Activities: ex. Takes medications (independent), administers insulin (in progress), checks cbg daily (independent) . Patient/wife will continue to utilize [place] transportation on Saturdays and engage [employer] for all other transportation  Please see past updates related to this goal by clicking on the "Past Updates" button in the selected goal      . I  would like to work on walking again (pt-stated)   Not on track    Nurse Case Manager Clinical Goal(s): Over the next 14 days, patient/wife will verbalize importance of  utilizing nutritional supplementation for malnutrition and muscle support  Over the next 14 days, patient/wife will verbalize engaging in home exercises prescribed by [facility] PT  Interventions:  . Discussed importance of nutritional supplementation and effects on muscle strength and endurance . Encouraged patient/wife to discuss appropriate nutritional supplement (Nepro vs Premier Protein Shakes) with dialysis team   Encouraged patient to consume currently prescribed supplement daily and increase to twice daily if approved by nephrologist   Encouraged patient/wife to participate in [facility] PT exercises as prescribed to support muscle gain and strength   02/14/2018 Encouraged wife and patient to consider engaging with palliative care to assist with goals of care.  Patient Self Care Activities: ex. Takes medications (independent), administers insulin (in progress), checks cbg daily (independent) . Patient will consume nutritional supplements as prescribe . Patient will continue to work with in home PT and work on prescribed activities/exercise independently . Openly communicate with wife regarding goals of care and engagement with Palliative Care  Please see past updates related to this goal by clicking on the "Past Updates" button in the selected goal       Telephone follow up appointment with CCM team member scheduled for: Two weeks    Tillie Viverette E. Rollene Rotunda, RN, BSN Nurse Care Coordinator [facility] Care Management  [phone]

## 2018-02-15 ENCOUNTER — Encounter
Admission: RE | Admit: 2018-02-15 | Discharge: 2018-02-15 | Disposition: A | Discharge status: Home / Self Care | Payer: Medicare Other | Source: Ambulatory Visit | Attending: Vascular Surgery | Admitting: Vascular Surgery

## 2018-02-15 ENCOUNTER — Other Ambulatory Visit: Payer: Self-pay

## 2018-02-15 DIAGNOSIS — Z01818 Encounter for other preprocedural examination: Secondary | ICD-10-CM | POA: Insufficient documentation

## 2018-02-15 HISTORY — DX: Urinary tract infection, site not specified: N39.0

## 2018-02-15 HISTORY — DX: Dependence on renal dialysis: Z99.2

## 2018-02-15 LAB — PROTIME-INR
INR: 1.08
Prothrombin Time: 13.9 seconds (ref 11.4–15.2)

## 2018-02-15 LAB — CBC WITH DIFFERENTIAL/PLATELET
ABS IMMATURE GRANULOCYTES: 0.05 10*3/uL (ref 0.00–0.07)
Basophils Absolute: 0.1 10*3/uL (ref 0.0–0.1)
Basophils Relative: 1 %
Eosinophils Absolute: 0.3 10*3/uL (ref 0.0–0.5)
Eosinophils Relative: 3 %
HCT: 32.1 % — ABNORMAL LOW (ref 39.0–52.0)
Hemoglobin: 10.3 g/dL — ABNORMAL LOW (ref 13.0–17.0)
Immature Granulocytes: 0 %
Lymphocytes Relative: 20 %
Lymphs Abs: 2.3 10*3/uL (ref 0.7–4.0)
MCH: 26.9 pg (ref 26.0–34.0)
MCHC: 32.1 g/dL (ref 30.0–36.0)
MCV: 83.8 fL (ref 80.0–100.0)
Monocytes Absolute: 0.9 10*3/uL (ref 0.1–1.0)
Monocytes Relative: 8 %
NEUTROS ABS: 7.9 10*3/uL — AB (ref 1.7–7.7)
Neutrophils Relative %: 68 %
Platelets: 327 10*3/uL (ref 150–400)
RBC: 3.83 MIL/uL — ABNORMAL LOW (ref 4.22–5.81)
RDW: 16.4 % — ABNORMAL HIGH (ref 11.5–15.5)
WBC: 11.5 10*3/uL — ABNORMAL HIGH (ref 4.0–10.5)
nRBC: 0 % (ref 0.0–0.2)

## 2018-02-15 LAB — BASIC METABOLIC PANEL
Anion gap: 7 (ref 5–15)
BUN: 19 mg/dL (ref 8–23)
CO2: 30 mmol/L (ref 22–32)
Calcium: 8.7 mg/dL — ABNORMAL LOW (ref 8.9–10.3)
Chloride: 99 mmol/L (ref 98–111)
Creatinine, Ser: 2.85 mg/dL — ABNORMAL HIGH (ref 0.61–1.24)
GFR calc Af Amer: 24 mL/min — ABNORMAL LOW (ref >60)
GFR calc non Af Amer: 21 mL/min — ABNORMAL LOW (ref >60)
Glucose, Bld: 125 mg/dL — ABNORMAL HIGH (ref 70–99)
POTASSIUM: 3.5 mmol/L (ref 3.5–5.1)
Sodium: 136 mmol/L (ref 135–145)

## 2018-02-15 LAB — TYPE AND SCREEN
ABO/RH(D): O POS
Antibody Screen: NEGATIVE

## 2018-02-15 LAB — APTT: APTT: 39 s — AB (ref 24–36)

## 2018-02-15 NOTE — Patient Instructions (Signed)
Your procedure is scheduled on: Friday, February 22, 2018  Report to TO THE SECOND FLOOR OF THE MEDICAL MALL    DO NOT STOP ON THE FIRST FLOOR TO REGISTER  To find out your arrival time please call [phone] between 1PM - 3PM on Thursday, February 21, 2018   Remember: Instructions that are not followed completely may result in serious medical risk,  up to and including death, or upon the discretion of your surgeon and anesthesiologist your  surgery may need to be rescheduled.     _X__ 1. Do not eat food after midnight the night before your procedure.                 No gum chewing or hard candies.                       ABSOLUTELY NOTHING SOLID IN YOUR MOUTH AFTER MIDNIGHT                  You may drink clear liquids up to 2 hours before you are scheduled to arrive for your surgery-                  DO not drink clear liquids within 2 hours of the start of your surgery.                  Clear Liquids include:  water, apple juice without pulp, clear carbohydrate                 drink such as Clearfast of Gatorade, Black Coffee or Tea (Do not add                 anything to coffee or tea).  __X__2.  On the morning of surgery brush your teeth with toothpaste and water,                  You may rinse your mouth with mouthwash if you wish.                       Do not swallow any toothpaste of mouthwash.     _X__ 3.  No Alcohol for 24 hours before or after surgery.   _X__ 4.  Do Not Smoke or use e-cigarettes For 24 Hours Prior to Your Surgery.                 Do not use any chewable tobacco products for at least 6 hours prior to                 surgery.  ____  5.  Bring all medications with you on the day of surgery if instructed.   ____  6.  Notify your doctor if there is any change in your medical condition      (cold, fever, infections).     Do not wear jewelry, make-up, hairpins, clips or nail polish. Do not wear lotions, powders, or perfumes. You may  NOT wear deodorant. Do not shave 48 hours prior to surgery. Men may shave face and neck. Do not bring valuables to the hospital.    [facility] is not responsible for any belongings or valuables.  Contacts, dentures or bridgework may not be worn into surgery. Leave your suitcase in the car. After surgery it may be brought to your room. For patients admitted to the hospital, discharge time is determined by your treatment team.  Patients discharged the day of surgery will not be allowed to drive home.   Please read over the following fact sheets that you were given:   PREPARING FOR SURGERY  ____ Take these medicines the morning of surgery with A SIP OF WATER:    1. CARVEDILOL  2. TAMSULOSIN  3.   4.  5.  6.  ____ Fleet Enema (as directed)   _X___ Use CHG WIPES as directed  ____ Use inhalers on the day of surgery  ____ Stop metformin 2 days prior to surgery    ____ Take 1/2 of usual insulin dose the night before surgery. No insulin the morning          of surgery.   __X__ Stop ASPIRIN AS OF TODAY  __X__ Stop Anti-inflammatories AS OF TODAY  ____ Stop supplements until after surgery.           HE MAY CONTINUE TAKING VITAMIN D3 / B12 / MULTIVITS AND CALCITROL             DO NOT TAKE ON THE DAY OF SURGERY  . ALSO, CONTINUE THE STOOL SOFTENER            BUT DO NOT TAKE ON THE DAY OF SURGERY   ____ Bring C-Pap to the hospital.   DO NOT TAKE ON THE DAY OF SURGERY ONLY, THE       OLMESARTAN  MEDAXOMIL  AMLODIPINE HYDROCHLOROTHIAZIDE         TAKE IT EVERY DAY UP UNTIL SURGERY  WEAR LOOSE AND COMFORTABLE CLOTHING  CONTINUE NIGHT TIME MEDICATIONS AS USUAL. THIS INCLUDES        ATORVASTATIN AND TRULICITY ON SUNDAY

## 2018-02-15 NOTE — Patient Instructions (Addendum)
1. Please consider engaging with Palliative Care. 2. Make an appointment with you endocrinologist. You missed your last appointment because of being in rehab. 3. Please call the CCM Team with any questions or concerns.   CCM (Chronic Care Management) Team   Trish Fountain RN, BSN Nurse Care Coordinator  [phone]  Ruben Reason PharmD  Clinical Pharmacist  [phone]  Goals Addressed            This Visit's Progress   . "my diabetes is doing pretty good" (pt-stated)       Nurse Case Manager Clinical Goal(s): Over the next 30 days patient will continue to report fasting blood glucose readings below 140  Interventions:  . Commended patient/wife on dietary discretions and maintaining control of diabetes  . Encouraged continued monitoring and medication compliance   Patient Self Care Activities: ex. Takes medications (independent), administers insulin (in progress), checks cbg daily (independent) . Patient/wife will continue to check cbg dialy  Please see past updates related to this goal by clicking on the "Past Updates" button in the selected goal   Pharmacist Clinical Goal(s): Over the next 30 days, patient, pharmacist, and provider, will work together to optimize diabetes pharmacotherapy to a plan that controls CBG but doesn't contribute to weight loss, feelings of satiety, or other GI side effects.   Interventions: . Recommend switching from Trulicity (a GLP-1 medication that can cause weight loss and lack of appetite) to pioglitazone (no renal adjustment)  02/14/2018 Encouraged wife make follow up appointment with endocrinologist for follow up  Patient Self Care Activities: ex. Takes medications (independent), administers insulin (in progress), checks cbg daily (independent) . Continue checking CBGs daily . Take all medications as prescribed  Please see past updates related to this goal by clicking on the "Past Updates" button in the selected goal        .  COMPLETED: "we really need assistance with transportation to and from dialysis" (pt-stated)       Nurse Case Manager Clinical Goal(s): Over the next two weeks, patient/wife will verbalize engagement with [employer] transportation for dialysis/medical appointments Interventions:  Marland Kitchen Verified with patients [facility] Medicare plan that needed transportation IS NOT covered . Collaborated with Celest at [facility] for transportation assistance via [employer]   Patient Self Care Activities: ex. Takes medications (independent), administers insulin (in progress), checks cbg daily (independent) . Patient/wife will continue to utilize [place] transportation on Saturdays and engage [employer] for all other transportation  Please see past updates related to this goal by clicking on the "Past Updates" button in the selected goal      . I would like to work on walking again (pt-stated)   Not on track    Nurse Case Manager Clinical Goal(s): Over the next 14 days, patient/wife will verbalize importance of utilizing nutritional supplementation for malnutrition and muscle support  Over the next 14 days, patient/wife will verbalize engaging in home exercises prescribed by [facility] PT  Interventions:  . Discussed importance of nutritional supplementation and effects on muscle strength and endurance . Encouraged patient/wife to discuss appropriate nutritional supplement (Nepro vs Premier Protein Shakes) with dialysis team   Encouraged patient to consume currently prescribed supplement daily and increase to twice daily if approved by nephrologist   Encouraged patient/wife to participate in [facility] PT exercises as prescribed to support muscle gain and strength   02/14/2018 Encouraged wife and patient to consider engaging with palliative care to assist with goals of care.  Patient Self Care Activities: ex. Takes medications (independent), administers insulin (in progress), checks cbg daily (independent) . Patient will consume  nutritional supplements as prescribe . Patient will continue to work with in home PT and work on prescribed activities/exercise independently . Openly communicate with wife regarding goals of care and engagement with Palliative Care  .   Please see past updates related to this goal by clicking on the "Past Updates" button in the selected goal

## 2018-02-15 NOTE — Pre-Procedure Instructions (Signed)
Patient sleeping through most of preadmit interview.  Wife was able to provide medical information. He is still complaining of pain with urination.  Wife states he needs to void often.  Still taking antibiotic for 3 more days. Denies any open areas/ sores on his body.

## 2018-02-16 NOTE — Pre-Procedure Instructions (Signed)
Met B, CBC, and PTT results sent to Dr. Delana Meyer and Anesthesia for review.

## 2018-02-20 ENCOUNTER — Encounter: Payer: Self-pay | Admitting: Urology

## 2018-02-20 ENCOUNTER — Ambulatory Visit (INDEPENDENT_AMBULATORY_CARE_PROVIDER_SITE_OTHER): Payer: Medicare Other | Admitting: Urology

## 2018-02-20 ENCOUNTER — Telehealth (INDEPENDENT_AMBULATORY_CARE_PROVIDER_SITE_OTHER): Payer: Self-pay

## 2018-02-20 ENCOUNTER — Encounter

## 2018-02-20 VITALS — BP 120/73 | HR 93 | Ht 77.0 in | Wt 200.0 lb

## 2018-02-20 DIAGNOSIS — R339 Retention of urine, unspecified: Secondary | ICD-10-CM

## 2018-02-20 DIAGNOSIS — R3916 Straining to void: Secondary | ICD-10-CM

## 2018-02-20 DIAGNOSIS — R8271 Bacteriuria: Secondary | ICD-10-CM | POA: Diagnosis not present

## 2018-02-20 LAB — URINALYSIS, COMPLETE
Bilirubin, UA: NEGATIVE
GLUCOSE, UA: NEGATIVE
Ketones, UA: NEGATIVE
Nitrite, UA: NEGATIVE
Specific Gravity, UA: 1.02 (ref 1.005–1.030)
Urobilinogen, Ur: 0.2 mg/dL (ref 0.2–1.0)
pH, UA: 8.5 — ABNORMAL HIGH (ref 5.0–7.5)

## 2018-02-20 LAB — MICROSCOPIC EXAMINATION: RBC, UA: NONE SEEN /hpf (ref 0–2)

## 2018-02-20 NOTE — Patient Instructions (Addendum)
Urinary Tract Infection, Adult A urinary tract infection (UTI) is an infection of any part of the urinary tract. The urinary tract includes:  The kidneys.  The ureters.  The bladder.  The urethra. These organs make, store, and get rid of pee (urine) in the body. What are the causes? This is caused by germs (bacteria) in your genital area. These germs grow and cause swelling (inflammation) of your urinary tract. What increases the risk? You are more likely to develop this condition if:  You have a small, thin tube (catheter) to drain pee.  You cannot control when you pee or poop (incontinence).  You are male, and: ? You use these methods to prevent pregnancy: ? A medicine that kills sperm (spermicide). ? A device that blocks sperm (diaphragm). ? You have low levels of a male hormone (estrogen). ? You are pregnant.  You have genes that add to your risk.  You are sexually active.  You take antibiotic medicines.  You have trouble peeing because of: ? A prostate that is bigger than normal, if you are male. ? A blockage in the part of your body that drains pee from the bladder (urethra). ? A kidney stone. ? A nerve condition that affects your bladder (neurogenic bladder). ? Not getting enough to drink. ? Not peeing often enough.  You have other conditions, such as: ? Diabetes. ? A weak disease-fighting system (immune system). ? Sickle cell disease. ? Gout. ? Injury of the spine. What are the signs or symptoms? Symptoms of this condition include:  Needing to pee right away (urgently).  Peeing often.  Peeing small amounts often.  Pain or burning when peeing.  Blood in the pee.  Pee that smells bad or not like normal.  Trouble peeing.  Pee that is cloudy.  Fluid coming from the vagina, if you are male.  Pain in the belly or lower back. Other symptoms include:  Throwing up (vomiting).  No urge to eat.  Feeling mixed up (confused).  Being tired  and grouchy (irritable).  A fever.  Watery poop (diarrhea). How is this treated? This condition may be treated with:  Antibiotic medicine.  Other medicines.  Drinking enough water. Follow these instructions at home:  Medicines  Take over-the-counter and prescription medicines only as told by your doctor.  If you were prescribed an antibiotic medicine, take it as told by your doctor. Do not stop taking it even if you start to feel better. General instructions  Make sure you: ? Pee until your bladder is empty. ? Do not hold pee for a long time. ? Empty your bladder after sex. ? Wipe from front to back after pooping if you are a male. Use each tissue one time when you wipe.  Drink enough fluid to keep your pee pale yellow.  Keep all follow-up visits as told by your doctor. This is important. Contact a doctor if:  You do not get better after 1-2 days.  Your symptoms go away and then come back. Get help right away if:  You have very bad back pain.  You have very bad pain in your lower belly.  You have a fever.  You are sick to your stomach (nauseous).  You are throwing up. Summary  A urinary tract infection (UTI) is an infection of any part of the urinary tract.  This condition is caused by germs in your genital area.  There are many risk factors for a UTI. These include having a small, thin   tube to drain pee and not being able to control when you pee or poop.  Treatment includes antibiotic medicines for germs.  Drink enough fluid to keep your pee pale yellow. This information is not intended to replace advice given to you by your health care provider. Make sure you discuss any questions you have with your health care provider. Document Released: 06/28/2007 Document Revised: 07/19/2017 Document Reviewed: 07/19/2017 Elsevier Interactive Patient Education  2019 [employer].  Cystoscopy  Cystoscopy is a procedure that is used to help diagnose and sometimes  treat conditions that affect that lower urinary tract. The lower urinary tract includes the bladder and the tube that drains urine from the bladder out of the body (urethra). Cystoscopy is performed with a thin, tube-shaped instrument with a light and camera at the end (cystoscope). The cystoscope may be hard (rigid) or flexible, depending on the goal of the procedure.The cystoscope is inserted through the urethra, into the bladder. Cystoscopy may be recommended if you have:  Urinary tractinfections that keep coming back (recurring).  Blood in the urine (hematuria).  Loss of bladder control (urinary incontinence) or an overactive bladder.  Unusual cells found in a urine sample.  A blockage in the urethra.  Painful urination.  An abnormality in the bladder found during an intravenous pyelogram (IVP) or CT scan. Cystoscopy may also be done to remove a sample of tissue to be examined under a microscope (biopsy). Tell a health care provider about:  Any allergies you have.  All medicines you are taking, including vitamins, herbs, eye drops, creams, and over-the-counter medicines.  Any problems you or family members have had with anesthetic medicines.  Any blood disorders you have.  Any surgeries you have had.  Any medical conditions you have.  Whether you are pregnant or may be pregnant. What are the risks? Generally, this is a safe procedure. However, problems may occur, including:  Infection.  Bleeding.  Allergic reactions to medicines.  Damage to other structures or organs. What happens before the procedure?  Ask your health care provider about: ? Changing or stopping your regular medicines. This is especially important if you are taking diabetes medicines or blood thinners. ? Taking medicines such as aspirin and ibuprofen. These medicines can thin your blood. Do not take these medicines before your procedure if your health care provider instructs you not to.  Follow  instructions from your health care provider about eating or drinking restrictions.  You may be given antibiotic medicine to help prevent infection.  You may have an exam or testing, such as X-rays of the bladder, urethra, or kidneys.  You may have urine tests to check for signs of infection.  Plan to have someone take you home after the procedure. What happens during the procedure?  To reduce your risk of infection,your health care team will wash or sanitize their hands.  You will be given one or more of the following: ? A medicine to help you relax (sedative). ? A medicine to numb the area (local anesthetic).  The area around the opening of your urethra will be cleaned.  The cystoscope will be passed through your urethra into your bladder.  Germ-free (sterile)fluid will flow through the cystoscope to fill your bladder. The fluid will stretch your bladder so that your surgeon can clearly examine your bladder walls.  The cystoscope will be removed and your bladder will be emptied. The procedure may vary among health care providers and hospitals. What happens after the procedure?  You  may have some soreness or pain in your abdomen and urethra. Medicines will be available to help you.  You may have some blood in your urine.  Do not drive for 24 hours if you received a sedative. This information is not intended to replace advice given to you by your health care provider. Make sure you discuss any questions you have with your health care provider. Document Released: 01/07/2000 Document Revised: 10/20/2016 Document Reviewed: 11/26/2014 Elsevier Interactive Patient Education  2019 [employer].

## 2018-02-20 NOTE — Telephone Encounter (Signed)
I received the information form the patient's wife that he still has a UTI, the patient is scheduled for surgery on 02/22/2018. Spoke with Dr. Delana Meyer and the patient should be rescheduled. The patient has been rescheduled to 03/08/2018 and the patient's wife has been informed of this.

## 2018-02-20 NOTE — Progress Notes (Signed)
02/20/2018 9:18 AM   Joseph Newman Jul 03, 1945 [id number]  Referring provider: Steele Sizer, MD [address] [place], [place] [address]  Chief Complaint  Patient presents with  . Recurrent UTI  . Urinary Retention    HPI: 73 year old male seen at the request of Raelyn Ensign for evaluation of obstructive voiding symptoms and recurrent UTI.  He was seen in the ED on 12/30/2017 complaining of nausea and vomiting but no voiding symptoms.  He is on hemodialysis for end-stage renal disease but is not oliguric.  For the past several months he does complain of urinary hesitancy and decreased force and caliber of his urinary stream.  Urinalysis at that visit showed >50 WBCs however available urinalyses since 2017 have shown pyuria.  Urine culture did grow ESBL E. coli.  He was given a dose of IV Rocephin and discharged on Keflex however his urine culture was resistant to these medications.  He was seen back in the ED on 12/29 complaining of dysuria.  The note states his symptoms have not improved however the original ED note stated he was not having any symptoms.  Urinalysis at that visit again showed pyuria and urine culture grew ESBL E. coli.  He was given a IV dose of meropenem and discharged on nitrofurantoin.  He was seen by his PCP in follow-up in 01/30/2017.  Repeat urine culture again grew E. coli although at that visit he was not having any symptoms.  He does have mild intermittent dysuria and urinary hesitancy.  He was started again on nitrofurantoin.  A renal ultrasound performed in March 2019 showed no renal masses, hydronephrosis or significant bladder distention.  PMH: Past Medical History:  Diagnosis Date  . Acute metabolic encephalopathy 20/25/4270   Per my chart  . Arthritis   . Chronic airway obstruction, not elsewhere classified    worked in [employer] and was exposed to [employer]  . 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  Holley Raring, nephrologist.  on dialysis tu - thur - sat  . Depressive disorder, not elsewhere classified   . Dialysis patient [facility]) 01/2018   dialysis tuesday - thursday - saturday  . Gout   . Gout   . Heart murmur    followed by dr. Clayborn Bigness  . Hyperlipidemia   . Hypertension   . Hypertrophy of prostate without urinary obstruction and other lower urinary tract symptoms (LUTS)   . Impotence of organic origin   . Microalbuminuria   . 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 Obesity, unspecified   . Plantar wart   . Sleep apnea    years ago but never used a cpap  . Synovitis and tenosynovitis, unspecified   . Type II or unspecified type diabetes mellitus without mention of complication, uncontrolled   . Unspecified venous (peripheral) insufficiency   . UTI (urinary tract infection)    frequent with ESBL    Surgical History: Past Surgical History:  Procedure Laterality Date  . DIALYSIS/PERMA CATHETER INSERTION N/A 09/28/2017   Procedure: DIALYSIS/PERMA CATHETER INSERTION;  Surgeon: Katha Cabal, MD;  Location: [facility];  Service: Cardiovascular;  Laterality: N/A;  . DIALYSIS/PERMA CATHETER INSERTION     peritoneal catheter  . foot surgery Right    repair of calus    Home Medications:  Allergies as of 02/20/2018   No Known Allergies     Medication List       Accurate as of February 20, 2018  11:59 PM. Always use your most recent med list.        allopurinol 100 MG tablet Commonly known as:  ZYLOPRIM GIVE .5 TABLET BY MOUTH ONE TIME A DAY EVERY OTHER DAY FOR GOUT   aspirin EC 81 MG tablet Take 81 mg by mouth daily.   atorvastatin 40 MG tablet Commonly known as:  LIPITOR Take 1 tablet (40 mg total) by mouth daily.   calcitRIOL 0.25 MCG capsule Commonly known as:  ROCALTROL Take 0.25 mcg by mouth daily.   carvedilol 6.25 MG tablet Commonly known as:  COREG Take 6.25 mg by mouth 2 (two) times daily with a meal.   cholecalciferol 25 MCG (1000  UT) tablet Commonly known as:  VITAMIN D3 Take 2 capsules by mouth daily.   docusate sodium 100 MG capsule Commonly known as:  COLACE Take 100 mg by mouth daily.   feeding supplement (NEPRO CARB STEADY) Liqd Take 237 mLs by mouth 3 (three) times daily between meals.   MULTIVITAMINS PO Take 1 tablet by mouth daily.   Olmesartan-amLODIPine-HCTZ 40-10-25 MG Tabs Take 1 tablet by mouth daily.   ondansetron 4 MG disintegrating tablet Commonly known as:  ZOFRAN ODT Take 1 tablet (4 mg total) by mouth every 8 (eight) hours as needed for nausea or vomiting.   ONE TOUCH ULTRA SYSTEM KIT w/Device Kit 1 kit by Does not apply route once.   phenazopyridine 100 MG tablet Commonly known as:  PYRIDIUM Take 100 mg by mouth 2 (two) times daily.   tamsulosin 0.4 MG Caps capsule Commonly known as:  FLOMAX Take 1 capsule (0.4 mg total) by mouth daily after breakfast.   TRULICITY 1.5 YS/0.6TK Sopn Generic drug:  Dulaglutide Inject 1.5 mg into the skin See admin instructions. Injects on Sundays   Vitamin B-12 1000 MCG Subl Take 1 tablet by mouth daily.       Allergies: No Known Allergies  Family History: Family History  Problem Relation Age of Onset  . Hypertension Mother   . Kidney disease Neg Hx   . Prostate cancer Neg Hx     Social History:  reports that he quit smoking about 7 months ago. His smoking use included cigarettes and cigars. He has a 7.50 pack-year smoking history. He has never used smokeless tobacco. He reports current alcohol use. He reports that he does not use drugs.  ROS: UROLOGY Frequent Urination?: No Hard to postpone urination?: No Burning/pain with urination?: Yes Get up at night to urinate?: No Leakage of urine?: No Urine stream starts and stops?: No Trouble starting stream?: No Do you have to strain to urinate?: Yes Blood in urine?: No Urinary tract infection?: Yes Sexually transmitted disease?: No Injury to kidneys or bladder?: No Painful  intercourse?: No Weak stream?: No Erection problems?: No Penile pain?: No  Gastrointestinal Nausea?: No Vomiting?: No Indigestion/heartburn?: No Diarrhea?: No Constipation?: No  Constitutional Fever: No Night sweats?: No Weight loss?: No Fatigue?: No  Skin Skin rash/lesions?: No Itching?: No  Eyes Blurred vision?: No Double vision?: No  Ears/Nose/Throat Sore throat?: No Sinus problems?: No  Hematologic/Lymphatic Swollen glands?: No Easy bruising?: No  Cardiovascular Leg swelling?: No Chest pain?: No  Respiratory Cough?: No Shortness of breath?: No  Endocrine Excessive thirst?: No  Musculoskeletal Back pain?: No Joint pain?: No  Neurological Headaches?: No Dizziness?: No  Psychologic Depression?: No Anxiety?: No  Physical Exam: BP 120/73 (BP Location: Left Arm, Patient Position: Sitting, Cuff Size: Large)   Pulse 93   Ht 6'  5" (1.956 m)   Wt 200 lb (90.7 kg)   BMI 23.72 kg/m   Constitutional:  Alert, No acute distress. HEENT: [place] AT, moist mucus membranes.  Trachea midline, no masses. Cardiovascular: No clubbing, cyanosis, or edema. Respiratory: Normal respiratory effort, no increased work of breathing. GI: Abdomen is soft, nontender, nondistended, no abdominal masses GU: No CVA tenderness Lymph: No cervical or inguinal lymphadenopathy. Skin: No rashes, bruises or suspicious lesions. Neurologic: Grossly intact, no focal deficits, moving all 4 extremities. Psychiatric: Normal mood and affect.   Assessment & Plan:   73 year old male with obstructive voiding symptoms and incomplete bladder emptying.  He has chronic bacteriuria but minimally symptomatic.  Would avoid treating based on his resistant bacteria.  Urine culture was repeated today.  Recommend scheduling cystoscopy and a follow-up bladder scan.  Will start on antibiotic 1 day prior to cystoscopy based on today's urine culture.  Abbie Sons, [place] [address], [place], [place] [address] [phone]

## 2018-02-21 ENCOUNTER — Other Ambulatory Visit: Payer: Self-pay | Admitting: Family Medicine

## 2018-02-21 ENCOUNTER — Encounter: Payer: Self-pay | Admitting: Urology

## 2018-02-21 DIAGNOSIS — N3000 Acute cystitis without hematuria: Secondary | ICD-10-CM

## 2018-02-23 LAB — CULTURE, URINE COMPREHENSIVE

## 2018-02-27 ENCOUNTER — Telehealth: Payer: Self-pay | Admitting: Family Medicine

## 2018-02-27 NOTE — Telephone Encounter (Signed)
Per Dr. Ancil Boozer ok to give verbal orders to [facility] for OT orders.

## 2018-02-27 NOTE — Telephone Encounter (Signed)
Copied from [place] [id number]. Topic: Quick Communication - Home Health Verbal Orders >> Feb 27, 2018  1:44 PM Scherrie Gerlach wrote: Caller/Agency: Lattie Haw // [facility] Callback Number: [phone] Requesting verbal OT orders 2 wk 4

## 2018-03-01 ENCOUNTER — Telehealth: Payer: Self-pay | Admitting: Family Medicine

## 2018-03-01 NOTE — Telephone Encounter (Signed)
Please check on patient . Seems like he is okay

## 2018-03-01 NOTE — Telephone Encounter (Signed)
Copied from [place] [id number]. Topic: Quick Communication - See Telephone Encounter >> Mar 01, 2018 11:15 AM Blase Mess A wrote: CRM for notification. See Telephone encounter for: 03/01/18.  Vista Deck calling from [employer] calling report the patient fell.  There is no brusing. Back is stiff pain 5 out of 10.  No interference with walking or transfers. [phone]

## 2018-03-04 ENCOUNTER — Telehealth: Payer: Self-pay

## 2018-03-04 NOTE — Telephone Encounter (Signed)
Unable to reach no voicemail

## 2018-03-04 NOTE — Telephone Encounter (Signed)
-----   Message from Abbie Sons, MD sent at 03/04/2018  7:58 AM EST ----- Urine culture was positive.  He has asymptomatic bacteriuria.  Scheduled for cystoscopy on 2/28.  Recommend starting Cipro 500 mg daily 1 day prior to cystoscopy for only 2 doses

## 2018-03-05 NOTE — Telephone Encounter (Signed)
Left vmail to call

## 2018-03-06 MED ORDER — CIPROFLOXACIN HCL 500 MG PO TABS: 500.0000 mg | ORAL_TABLET | Freq: Two times a day (BID) | ORAL | 0 refills | Status: DC

## 2018-03-06 NOTE — Telephone Encounter (Signed)
Patient and wife on speakerphone were notified and script was sent to the pharm

## 2018-03-06 NOTE — Telephone Encounter (Signed)
Pt's wife (I think) she didn't leave a name, LMOM and states that they were returning a call back for lab results. Thanks

## 2018-03-08 ENCOUNTER — Other Ambulatory Visit: Payer: Self-pay

## 2018-03-08 ENCOUNTER — Ambulatory Visit: Payer: Medicare Other | Admitting: Anesthesiology

## 2018-03-08 ENCOUNTER — Encounter: Payer: Self-pay | Admitting: Vascular Surgery

## 2018-03-08 ENCOUNTER — Encounter
Admission: RE | Disposition: A | Discharge status: Home / Self Care | Payer: Self-pay | Source: Home / Self Care | Attending: Vascular Surgery

## 2018-03-08 ENCOUNTER — Ambulatory Visit
Admission: RE | Admit: 2018-03-08 | Discharge: 2018-03-08 | Disposition: A | Discharge status: Home / Self Care | Payer: Medicare Other | Attending: Vascular Surgery | Admitting: Vascular Surgery

## 2018-03-08 DIAGNOSIS — Z79899 Other long term (current) drug therapy: Secondary | ICD-10-CM | POA: Insufficient documentation

## 2018-03-08 DIAGNOSIS — E785 Hyperlipidemia, unspecified: Secondary | ICD-10-CM | POA: Insufficient documentation

## 2018-03-08 DIAGNOSIS — Z7982 Long term (current) use of aspirin: Secondary | ICD-10-CM | POA: Insufficient documentation

## 2018-03-08 DIAGNOSIS — J449 Chronic obstructive pulmonary disease, unspecified: Secondary | ICD-10-CM | POA: Diagnosis not present

## 2018-03-08 DIAGNOSIS — E1122 Type 2 diabetes mellitus with diabetic chronic kidney disease: Secondary | ICD-10-CM | POA: Insufficient documentation

## 2018-03-08 DIAGNOSIS — F17290 Nicotine dependence, other tobacco product, uncomplicated: Secondary | ICD-10-CM | POA: Diagnosis not present

## 2018-03-08 DIAGNOSIS — Z992 Dependence on renal dialysis: Secondary | ICD-10-CM | POA: Insufficient documentation

## 2018-03-08 DIAGNOSIS — T85611A Breakdown (mechanical) of intraperitoneal dialysis catheter, initial encounter: Secondary | ICD-10-CM | POA: Insufficient documentation

## 2018-03-08 DIAGNOSIS — F17210 Nicotine dependence, cigarettes, uncomplicated: Secondary | ICD-10-CM | POA: Insufficient documentation

## 2018-03-08 DIAGNOSIS — N186 End stage renal disease: Secondary | ICD-10-CM

## 2018-03-08 DIAGNOSIS — M109 Gout, unspecified: Secondary | ICD-10-CM | POA: Diagnosis not present

## 2018-03-08 DIAGNOSIS — N182 Chronic kidney disease, stage 2 (mild): Secondary | ICD-10-CM | POA: Diagnosis not present

## 2018-03-08 DIAGNOSIS — I129 Hypertensive chronic kidney disease with stage 1 through stage 4 chronic kidney disease, or unspecified chronic kidney disease: Secondary | ICD-10-CM | POA: Insufficient documentation

## 2018-03-08 DIAGNOSIS — Y838 Other surgical procedures as the cause of abnormal reaction of the patient, or of later complication, without mention of misadventure at the time of the procedure: Secondary | ICD-10-CM | POA: Insufficient documentation

## 2018-03-08 DIAGNOSIS — N400 Enlarged prostate without lower urinary tract symptoms: Secondary | ICD-10-CM | POA: Insufficient documentation

## 2018-03-08 HISTORY — PX: REMOVAL OF A DIALYSIS CATHETER: SHX6053

## 2018-03-08 HISTORY — PX: AV FISTULA PLACEMENT: SHX1204

## 2018-03-08 LAB — POCT I-STAT 4, (NA,K, GLUC, HGB,HCT)
Glucose, Bld: 97 mg/dL (ref 70–99)
HCT: 34 % — ABNORMAL LOW (ref 39.0–52.0)
Hemoglobin: 11.6 g/dL — ABNORMAL LOW (ref 13.0–17.0)
Potassium: 4.1 mmol/L (ref 3.5–5.1)
Sodium: 137 mmol/L (ref 135–145)

## 2018-03-08 LAB — TYPE AND SCREEN
ABO/RH(D): O POS
Antibody Screen: NEGATIVE

## 2018-03-08 LAB — GLUCOSE, CAPILLARY: Glucose-Capillary: 90 mg/dL (ref 70–99)

## 2018-03-08 SURGERY — ARTERIOVENOUS (AV) FISTULA CREATION
Anesthesia: General | Laterality: Right

## 2018-03-08 MED ORDER — BACITRACIN 500 UNIT/GM EX OINT
TOPICAL_OINTMENT | CUTANEOUS | Status: DC | PRN
  Administered 2018-03-08: 1 via TOPICAL

## 2018-03-08 MED ORDER — HEPARIN SODIUM (PORCINE) 5000 UNIT/ML IJ SOLN
INTRAMUSCULAR | Status: AC
  Filled 2018-03-08: qty 1

## 2018-03-08 MED ORDER — ROCURONIUM BROMIDE 50 MG/5ML IV SOLN
INTRAVENOUS | Status: AC
  Filled 2018-03-08: qty 2

## 2018-03-08 MED ORDER — PHENYLEPHRINE HCL 10 MG/ML IJ SOLN
INTRAMUSCULAR | Status: DC | PRN
  Administered 2018-03-08 (×3): 100 ug via INTRAVENOUS
  Administered 2018-03-08: 200 ug via INTRAVENOUS
  Administered 2018-03-08 (×2): 100 ug via INTRAVENOUS
  Administered 2018-03-08 (×2): 200 ug via INTRAVENOUS
  Administered 2018-03-08: 100 ug via INTRAVENOUS
  Administered 2018-03-08: 200 ug via INTRAVENOUS

## 2018-03-08 MED ORDER — SUGAMMADEX SODIUM 200 MG/2ML IV SOLN
INTRAVENOUS | Status: DC | PRN
  Administered 2018-03-08: 200 mg via INTRAVENOUS

## 2018-03-08 MED ORDER — ONDANSETRON HCL 4 MG/2ML IJ SOLN
INTRAMUSCULAR | Status: AC
  Filled 2018-03-08: qty 2

## 2018-03-08 MED ORDER — ONDANSETRON HCL 4 MG/2ML IJ SOLN: 4.0000 mg | Freq: Once | INTRAMUSCULAR | Status: DC | PRN

## 2018-03-08 MED ORDER — PROPOFOL 10 MG/ML IV BOLUS
INTRAVENOUS | Status: AC
  Filled 2018-03-08: qty 40

## 2018-03-08 MED ORDER — PHENYLEPHRINE HCL 10 MG/ML IJ SOLN
INTRAMUSCULAR | Status: AC
  Filled 2018-03-08: qty 1

## 2018-03-08 MED ORDER — PROPOFOL 10 MG/ML IV BOLUS
INTRAVENOUS | Status: DC | PRN
  Administered 2018-03-08: 180 mg via INTRAVENOUS

## 2018-03-08 MED ORDER — HYDROCODONE-ACETAMINOPHEN 5-325 MG PO TABS: 1.0000 | ORAL_TABLET | Freq: Four times a day (QID) | ORAL | 0 refills | Status: DC | PRN

## 2018-03-08 MED ORDER — FENTANYL CITRATE (PF) 100 MCG/2ML IJ SOLN
INTRAMUSCULAR | Status: DC | PRN
  Administered 2018-03-08 (×2): 25 ug via INTRAVENOUS

## 2018-03-08 MED ORDER — CEFAZOLIN SODIUM-DEXTROSE 1-4 GM/50ML-% IV SOLN
INTRAVENOUS | Status: AC
  Filled 2018-03-08: qty 50

## 2018-03-08 MED ORDER — MIDAZOLAM HCL 2 MG/2ML IJ SOLN
INTRAMUSCULAR | Status: DC | PRN
  Administered 2018-03-08: 1 mg via INTRAVENOUS

## 2018-03-08 MED ORDER — FENTANYL CITRATE (PF) 100 MCG/2ML IJ SOLN
INTRAMUSCULAR | Status: AC
  Filled 2018-03-08: qty 2

## 2018-03-08 MED ORDER — SODIUM CHLORIDE 0.9 % IV SOLN
INTRAVENOUS | Status: DC | PRN
  Administered 2018-03-08: 20 mL via INTRAMUSCULAR

## 2018-03-08 MED ORDER — SUGAMMADEX SODIUM 200 MG/2ML IV SOLN
INTRAVENOUS | Status: AC
  Filled 2018-03-08: qty 2

## 2018-03-08 MED ORDER — LIDOCAINE HCL (CARDIAC) PF 100 MG/5ML IV SOSY
PREFILLED_SYRINGE | INTRAVENOUS | Status: DC | PRN
  Administered 2018-03-08: 100 mg via INTRAVENOUS

## 2018-03-08 MED ORDER — SODIUM CHLORIDE 0.9 % IV SOLN
INTRAVENOUS | Status: DC | PRN
  Administered 2018-03-08: 20 ug/min via INTRAVENOUS

## 2018-03-08 MED ORDER — BUPIVACAINE HCL (PF) 0.5 % IJ SOLN
INTRAMUSCULAR | Status: DC | PRN
  Administered 2018-03-08: 28 mL

## 2018-03-08 MED ORDER — LIDOCAINE HCL (PF) 2 % IJ SOLN
INTRAMUSCULAR | Status: AC
  Filled 2018-03-08: qty 10

## 2018-03-08 MED ORDER — MIDAZOLAM HCL 2 MG/2ML IJ SOLN
INTRAMUSCULAR | Status: AC
  Filled 2018-03-08: qty 2

## 2018-03-08 MED ORDER — BACITRACIN ZINC 500 UNIT/GM EX OINT
TOPICAL_OINTMENT | CUTANEOUS | Status: AC
  Filled 2018-03-08: qty 28.35

## 2018-03-08 MED ORDER — ONDANSETRON HCL 4 MG/2ML IJ SOLN
INTRAMUSCULAR | Status: DC | PRN
  Administered 2018-03-08: 4 mg via INTRAVENOUS

## 2018-03-08 MED ORDER — BUPIVACAINE HCL (PF) 0.5 % IJ SOLN
INTRAMUSCULAR | Status: AC
  Filled 2018-03-08: qty 30

## 2018-03-08 MED ORDER — SODIUM CHLORIDE 0.9 % IV SOLN
INTRAVENOUS | Status: DC
  Administered 2018-03-08: 06:00:00 via INTRAVENOUS

## 2018-03-08 MED ORDER — ROCURONIUM BROMIDE 100 MG/10ML IV SOLN
INTRAVENOUS | Status: DC | PRN
  Administered 2018-03-08: 10 mg via INTRAVENOUS
  Administered 2018-03-08: 40 mg via INTRAVENOUS
  Administered 2018-03-08: 20 mg via INTRAVENOUS
  Administered 2018-03-08: 10 mg via INTRAVENOUS

## 2018-03-08 MED ORDER — CEFAZOLIN SODIUM-DEXTROSE 1-4 GM/50ML-% IV SOLN
1.0000 g | INTRAVENOUS | Status: AC
  Administered 2018-03-08: 1 g via INTRAVENOUS

## 2018-03-08 MED ORDER — FENTANYL CITRATE (PF) 100 MCG/2ML IJ SOLN: 25.0000 ug | INTRAMUSCULAR | Status: DC | PRN

## 2018-03-08 SURGICAL SUPPLY — 70 items
APPLIER CLIP 11 MED OPEN (CLIP)
APPLIER CLIP 9.375 SM OPEN (CLIP)
BAG DECANTER FOR FLEXI CONT (MISCELLANEOUS) ×4 IMPLANT
BLADE SURG 15 STRL LF DISP TIS (BLADE) ×2 IMPLANT
BLADE SURG 15 STRL SS (BLADE) ×2
BLADE SURG SZ11 CARB STEEL (BLADE) ×4 IMPLANT
BOOT SUTURE AID YELLOW STND (SUTURE) ×4 IMPLANT
BRUSH SCRUB EZ  4% CHG (MISCELLANEOUS) ×2
BRUSH SCRUB EZ 4% CHG (MISCELLANEOUS) ×2 IMPLANT
CANISTER SUCT 1200ML W/VALVE (MISCELLANEOUS) ×4 IMPLANT
CHLORAPREP W/TINT 26ML (MISCELLANEOUS) ×4 IMPLANT
CLIP APPLIE 11 MED OPEN (CLIP) IMPLANT
CLIP APPLIE 9.375 SM OPEN (CLIP) IMPLANT
COVER WAND RF STERILE (DRAPES) ×4 IMPLANT
DERMABOND ADVANCED (GAUZE/BANDAGES/DRESSINGS) ×4
DERMABOND ADVANCED .7 DNX12 (GAUZE/BANDAGES/DRESSINGS) ×4 IMPLANT
DRAPE LAPAROTOMY 100X77 ABD (DRAPES) ×4 IMPLANT
DRESSING SURGICEL FIBRLLR 1X2 (HEMOSTASIS) ×2 IMPLANT
DRSG SURGICEL FIBRILLAR 1X2 (HEMOSTASIS) ×4
ELECT CAUTERY BLADE 6.4 (BLADE) ×8 IMPLANT
ELECT REM PT RETURN 9FT ADLT (ELECTROSURGICAL) ×4
ELECTRODE REM PT RTRN 9FT ADLT (ELECTROSURGICAL) ×2 IMPLANT
GEL ULTRASOUND 20GR AQUASONIC (MISCELLANEOUS) IMPLANT
GLOVE BIO SURGEON STRL SZ7 (GLOVE) ×4 IMPLANT
GLOVE INDICATOR 7.5 STRL GRN (GLOVE) ×4 IMPLANT
GLOVE SURG SYN 8.0 (GLOVE) ×4 IMPLANT
GOWN STRL REUS W/ TWL LRG LVL3 (GOWN DISPOSABLE) ×4 IMPLANT
GOWN STRL REUS W/ TWL XL LVL3 (GOWN DISPOSABLE) ×2 IMPLANT
GOWN STRL REUS W/TWL LRG LVL3 (GOWN DISPOSABLE) ×4
GOWN STRL REUS W/TWL XL LVL3 (GOWN DISPOSABLE) ×2
HANDLE YANKAUER SUCT BULB TIP (MISCELLANEOUS) ×4 IMPLANT
IV NS 500ML (IV SOLUTION) ×2
IV NS 500ML BAXH (IV SOLUTION) ×2 IMPLANT
KIT TURNOVER KIT A (KITS) ×4 IMPLANT
LABEL OR SOLS (LABEL) ×4 IMPLANT
LOOP RED MAXI  1X406MM (MISCELLANEOUS) ×2
LOOP VESSEL MAXI 1X406 RED (MISCELLANEOUS) ×2 IMPLANT
LOOP VESSEL MINI 0.8X406 BLUE (MISCELLANEOUS) ×2 IMPLANT
LOOPS BLUE MINI 0.8X406MM (MISCELLANEOUS) ×2
NEEDLE FILTER BLUNT 18X 1/2SAF (NEEDLE) ×2
NEEDLE FILTER BLUNT 18X1 1/2 (NEEDLE) ×2 IMPLANT
NEEDLE HYPO 25X1 1.5 SAFETY (NEEDLE) ×4 IMPLANT
NEEDLE HYPO 30X.5 LL (NEEDLE) IMPLANT
NS IRRIG 500ML POUR BTL (IV SOLUTION) ×4 IMPLANT
PACK BASIN MINOR ARMC (MISCELLANEOUS) IMPLANT
PACK EXTREMITY ARMC (MISCELLANEOUS) ×4 IMPLANT
PAD PREP 24X41 OB/GYN DISP (PERSONAL CARE ITEMS) ×4 IMPLANT
PENCIL ELECTRO HAND CTR (MISCELLANEOUS) ×4 IMPLANT
PUNCH SURGICAL ROTATE 2.7MM (MISCELLANEOUS) IMPLANT
SPONGE GAUZE 2X2 8PLY STER LF (GAUZE/BANDAGES/DRESSINGS) ×1
SPONGE GAUZE 2X2 8PLY STRL LF (GAUZE/BANDAGES/DRESSINGS) ×3 IMPLANT
STOCKINETTE STRL 4IN 9604848 (GAUZE/BANDAGES/DRESSINGS) ×4 IMPLANT
SUT MNCRL+ 5-0 UNDYED PC-3 (SUTURE) ×2 IMPLANT
SUT MONOCRYL 5-0 (SUTURE) ×2
SUT PROLENE 6 0 BV (SUTURE) ×12 IMPLANT
SUT SILK 2 0 (SUTURE) ×2
SUT SILK 2-0 18XBRD TIE 12 (SUTURE) ×2 IMPLANT
SUT SILK 3 0 (SUTURE) ×2
SUT SILK 3-0 18XBRD TIE 12 (SUTURE) ×2 IMPLANT
SUT SILK 4 0 (SUTURE) ×2
SUT SILK 4-0 18XBRD TIE 12 (SUTURE) ×2 IMPLANT
SUT VIC AB 0 CT2 27 (SUTURE) ×4 IMPLANT
SUT VIC AB 3-0 SH 27 (SUTURE) ×2
SUT VIC AB 3-0 SH 27X BRD (SUTURE) ×2 IMPLANT
SYR 10ML LL (SYRINGE) ×4 IMPLANT
SYR 20CC LL (SYRINGE) ×4 IMPLANT
SYR 3ML LL SCALE MARK (SYRINGE) ×4 IMPLANT
TOWEL OR 17X26 4PK STRL BLUE (TOWEL DISPOSABLE) IMPLANT
TUBING CONNECTING 10 (TUBING) ×3 IMPLANT
TUBING CONNECTING 10' (TUBING) ×1

## 2018-03-08 NOTE — Anesthesia Procedure Notes (Signed)
Procedure Name: Intubation Date/Time: 03/08/2018 7:48 AM Performed by: Lavone Orn, CRNA Pre-anesthesia Checklist: Patient identified, Emergency Drugs available, Suction available, Patient being monitored and Timeout performed Patient Re-evaluated:Patient Re-evaluated prior to induction Oxygen Delivery Method: Circle system utilized Preoxygenation: Pre-oxygenation with 100% oxygen Induction Type: IV induction Ventilation: Mask ventilation without difficulty and Oral airway inserted - appropriate to patient size Laryngoscope Size: Mac and 4 Grade View: Grade II Tube type: Oral Number of attempts: 1 Airway Equipment and Method: Stylet Placement Confirmation: ETT inserted through vocal cords under direct vision,  positive ETCO2 and breath sounds checked- equal and bilateral Secured at: 21 cm Tube secured with: Tape Dental Injury: Teeth and Oropharynx as per pre-operative assessment

## 2018-03-08 NOTE — Anesthesia Postprocedure Evaluation (Signed)
Anesthesia Post Note  Patient: [facility]  Procedure(s) Performed: ARTERIOVENOUS (AV) FISTULA CREATION (Right ) REMOVAL OF A DIALYSIS CATHETER (N/A )  Patient location during evaluation: PACU Anesthesia Type: General Level of consciousness: awake and alert Pain management: pain level controlled Vital Signs Assessment: post-procedure vital signs reviewed and stable Respiratory status: spontaneous breathing and respiratory function stable Cardiovascular status: stable Anesthetic complications: no     Last Vitals:  Vitals:   03/08/18 1050 03/08/18 1152  BP: (!) 109/51 122/61  Pulse: 77 77  Resp:  18  Temp: (!) 36.3 C   SpO2:  94%    Last Pain:  Vitals:   03/08/18 1050  TempSrc: Temporal  PainSc: 0-No pain                 KEPHART,WILLIAM K

## 2018-03-08 NOTE — Anesthesia Preprocedure Evaluation (Signed)
Anesthesia Evaluation  Patient identified by MRN, date of birth, ID band Patient awake    Reviewed: Allergy & Precautions, NPO status , Patient's Chart, lab work & pertinent test results  History of Anesthesia Complications Negative for: history of anesthetic complications  Airway Mallampati: II       Dental   Pulmonary sleep apnea , COPD, former smoker,           Cardiovascular hypertension, Pt. on medications and Pt. on home beta blockers (-) Past MI and (-) CHF (-) dysrhythmias      Neuro/Psych neg Seizures Depression    GI/Hepatic Neg liver ROS, neg GERD  ,  Endo/Other  diabetes, Type 2, Oral Hypoglycemic Agents  Renal/GU ESRF and DialysisRenal disease     Musculoskeletal   Abdominal   Peds  Hematology   Anesthesia Other Findings   Reproductive/Obstetrics                             Anesthesia Physical Anesthesia Plan  ASA: IV  Anesthesia Plan: General   Post-op Pain Management:    Induction: Intravenous  PONV Risk Score and Plan: 2 and Ondansetron and Midazolam  Airway Management Planned: Oral ETT and LMA  Additional Equipment:   Intra-op Plan:   Post-operative Plan:   Informed Consent: I have reviewed the patients History and Physical, chart, labs and discussed the procedure including the risks, benefits and alternatives for the proposed anesthesia with the patient or authorized representative who has indicated his/her understanding and acceptance.       Plan Discussed with:   Anesthesia Plan Comments:         Anesthesia Quick Evaluation

## 2018-03-08 NOTE — Discharge Instructions (Signed)

## 2018-03-08 NOTE — Transfer of Care (Signed)
Immediate Anesthesia Transfer of Care Note  Patient: Joseph Newman  Procedure(s) Performed: ARTERIOVENOUS (AV) FISTULA CREATION (Right ) REMOVAL OF A DIALYSIS CATHETER (N/A )  Patient Location: PACU  Anesthesia Type:General  Level of Consciousness: awake and drowsy  Airway & Oxygen Therapy: Patient Spontanous Breathing and Patient connected to face mask oxygen  Post-op Assessment: Report given to RN and Post -op Vital signs reviewed and stable  Post vital signs: stable  Last Vitals:  Vitals Value Taken Time  BP 140/63 03/08/2018 10:14 AM  Temp    Pulse 79 03/08/2018 10:18 AM  Resp 21 03/08/2018 10:18 AM  SpO2 99 % 03/08/2018 10:18 AM  Vitals shown include unvalidated device data.  Last Pain:  Vitals:   03/08/18 0619  TempSrc: Temporal  PainSc: 0-No pain         Complications: No apparent anesthesia complications

## 2018-03-08 NOTE — Op Note (Signed)
OPERATIVE NOTE   PROCEDURE: right brachial cephalic arteriovenous fistula placement  PRE-OPERATIVE DIAGNOSIS: End Stage Renal Disease  POST-OPERATIVE DIAGNOSIS: End Stage Renal Disease  SURGEON: Hortencia Pilar  ASSISTANT(S): Ms. Hezzie Bump  ANESTHESIA: general  ESTIMATED BLOOD LOSS: <50 cc  FINDING(S): 5 mm cephalic vein  SPECIMEN(S):  none  INDICATIONS:   Corvin Sorbo is a 73 y.o. male who presents with end stage renal disease.  The patient is scheduled for right brachiocephalic arteriovenous fistula placement.  The patient is aware the risks include but are not limited to: bleeding, infection, steal syndrome, nerve damage, ischemic monomelic neuropathy, failure to mature, and need for additional procedures.  The patient is aware of the risks of the procedure and elects to proceed forward.  DESCRIPTION: After full informed written consent was obtained from the patient, the patient was brought back to the operating room and placed supine upon the operating table.  Prior to induction, the patient received IV antibiotics.   After obtaining adequate anesthesia, the patient was then prepped and draped in the standard fashion for a right arm access procedure.   A first assistant was required to provide a safe and appropriate environment for executing the surgery.  The assistant was integral in providing retraction, exposure, running suture providing suction and in the closing process.   A curvilinear incision was then created midway between the radial impulse and the cephalic vein. The cephalic vein was then identified and dissected circumferentially. It was marked with a surgical marker.    Attention was then turned to the brachial artery which was exposed through the same incision and looped proximally and distally. Side branches were controlled with 4-0 silk ties.  The distal segment of the vein was ligated with a  2-0 silk, and the vein was transected.  The proximal  segment was interrogated with serial dilators.  The vein accepted up to a 5 mm dilator without any difficulty. Heparinized saline was infused into the vein and clamped it with a small bulldog.  At this point, I reset my exposure of the brachial artery and controlled the artery with vessel loops proximally and distally.  An arteriotomy was then made with a #11 blade, and extended with a Potts scissor.  Heparinized saline was injected proximal and distal into the radial artery.  The vein was then approximated to the artery while the artery was in its native bed and subsequently the vein was beveled using Potts scissors. The vein was then sewn to the artery in an end-to-side configuration with a running stitch of 6-0 Prolene.  Prior to completing this anastomosis Flushing maneuvers were performed and the artery was allowed to forward and back bleed.  There was no evidence of clot from any vessels.  I completed the anastomosis in the usual fashion and then released all vessel loops and clamps.    There was good  thrill in the venous outflow, and there was 1+ palpable radial pulse.  At this point, I irrigated out the surgical wound.  There was no further active bleeding.  The subcutaneous tissue was reapproximated with a running stitch of 3-0 Vicryl.  The skin was then reapproximated with a running subcuticular stitch of 4-0 Vicryl.  The skin was then cleaned, dried, and reinforced with Dermabond.  Attention is then turned to the PD catheter and the patient is reprepped and draped in a sterile fashion  The cuff was localized near the umbilicus.  After appropriate timeout is called, local anesthetic using  quarter percent Marcaine is infiltrated into the surrounding tissues around the cuff in the left lower quadrant. Previous incision is open with an 15 blade scalpel and the dissection was carried down to expose the cuff of the tunneled catheter.  The catheter is then freed from the surrounding attachments and  adhesions. Once the catheter has been freed circumferentially a pursestring suture of 0 Vicryl is placed within the anterior rectus sheath. The catheter is then removed from the peritoneal cavity and the pursestring suture secured. The tissue surrounding the second cuff is then infiltrated with quarter percent Marcaine and dissected free from the surrounding tissues near the exit site.  This particular PD catheter has 3 cuffs and the third cuff is located just lateral to the midline in the left upper quadrant.  Quarter percent Marcaine is infiltrated in surrounding tissues and a small transverse incision is created dissection is carried down to expose the catheter was which is grasped with a [name].  Cuff is located and dissected free from surrounding tissues.  The catheter is then transected above and below this middle cuff.  The inferior pieces then removed as well as the exit piece.  All 3 pieces are then laid on the back table and when placed together re-create the entire catheter.  The lower quadrant incision and the upper incision is then irrigated and closed in layers with Vicryl.. A 4-0 Monocryl was used close the skin. Dermabond is applied to the incision.   Antibiotic ointment and a sterile dressing is applied to the exit site. Patient tolerated procedure well and there were no complications.   The patient tolerated this procedure well.   COMPLICATIONS: None  CONDITION: [facility]  Office: [phone]   03/08/2018, 10:59 AM

## 2018-03-08 NOTE — Anesthesia Post-op Follow-up Note (Signed)
Anesthesia QCDR form completed.        

## 2018-03-08 NOTE — H&P (Signed)
[facility] Admission History & Physical  MRN : [id number]  Joseph Newman is a 73 y.o. (August 31, 1945) male who presents with chief complaint of No chief complaint on file. Marland Kitchen  History of Present Illness:   The patient is seen for evaluation of dialysis access.  The patient has a history of multiple failed accesses.  There have been accesses in both arms and in the thighs.    Current access is via a catheter which is functioning poorly.  There have been several episodes of catheter infection.  The patient denies amaurosis fugax or recent TIA symptoms. There are no recent neurological changes noted. The patient denies claudication symptoms or rest pain symptoms. The patient denies history of DVT, PE or superficial thrombophlebitis. The patient denies recent episodes of angina or shortness of breath.   Vein mapping has been performed the patient appears to have adequate vein for a radiocephalic fistula.   Current Facility-Administered Medications  Medication Dose Route Frequency Provider Last Rate Last Dose  . 0.9 %  sodium chloride infusion   Intravenous Continuous Gunnar Bulla, MD 50 mL/hr at 03/08/18 [id number]    . ceFAZolin (ANCEF) 1-4 GM/50ML-% IVPB           . ceFAZolin (ANCEF) IVPB 1 g/50 mL premix  1 g Intravenous On Call to [place], NP        Past Medical History:  Diagnosis Date  . Acute metabolic encephalopathy 90/21/1155   Per my chart  . Arthritis   . Chronic airway obstruction, not elsewhere classified    worked in [employer] and was exposed to [employer]  . 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 Holley Raring, nephrologist.  on dialysis tu - thur - sat  . Depressive disorder, not elsewhere classified   . Dialysis patient [facility]) 01/2018   dialysis tuesday - thursday - saturday  . Gout   . Gout   . Heart murmur    followed by dr. Clayborn Bigness  . Hyperlipidemia   . Hypertension   . Hypertrophy of prostate without  urinary obstruction and other lower urinary tract symptoms (LUTS)   . Impotence of organic origin   . Microalbuminuria   . 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 Obesity, unspecified   . Plantar wart   . Sleep apnea    years ago but never used a cpap  . Synovitis and tenosynovitis, unspecified   . Type II or unspecified type diabetes mellitus without mention of complication, uncontrolled   . Unspecified venous (peripheral) insufficiency   . UTI (urinary tract infection)    frequent with ESBL    Past Surgical History:  Procedure Laterality Date  . DIALYSIS/PERMA CATHETER INSERTION N/A 09/28/2017   Procedure: DIALYSIS/PERMA CATHETER INSERTION;  Surgeon: Katha Cabal, MD;  Location: [facility];  Service: Cardiovascular;  Laterality: N/A;  . DIALYSIS/PERMA CATHETER INSERTION     peritoneal catheter  . foot surgery Right    repair of calus    Social History Social History   Tobacco Use  . Smoking status: Former Smoker    Packs/day: 0.25    Years: 30.00    Pack years: 7.50    Types: Cigarettes, Cigars    Last attempt to quit: 07/23/2017    Years since quitting: 0.6  . Smokeless tobacco: Never Used  Substance Use Topics  . Alcohol use: Yes    Alcohol/week: 0.0 standard drinks    Comment: occasional  .  Drug use: No    Family History Family History  Problem Relation Age of Onset  . Hypertension Mother   . Kidney disease Neg Hx   . Prostate cancer Neg Hx     No family history of bleeding or clotting disorders, autoimmune disease or porphyria  No Known Allergies   REVIEW OF SYSTEMS (Negative unless checked)  Constitutional: [] Weight loss  [] Fever  [] Chills Cardiac: [] Chest pain   [] Chest pressure   [] Palpitations   [] Shortness of breath when laying flat   [] Shortness of breath at rest   [x] Shortness of breath with exertion. Vascular:  [] Pain in legs with walking   [] Pain in legs at rest   [] Pain in legs when laying flat   [] Claudication   [] Pain  in feet when walking  [] Pain in feet at rest  [] Pain in feet when laying flat   [] History of DVT   [] Phlebitis   [] Swelling in legs   [] Varicose veins   [] Non-healing ulcers Pulmonary:   [] Uses home oxygen   [] Productive cough   [] Hemoptysis   [] Wheeze  [x] COPD   [] Asthma Neurologic:  [] Dizziness  [] Blackouts   [] Seizures   [] History of stroke   [] History of TIA  [] Aphasia   [] Temporary blindness   [] Dysphagia   [] Weakness or numbness in arms   [] Weakness or numbness in legs Musculoskeletal:  [] Arthritis   [] Joint swelling   [] Joint pain   [] Low back pain Hematologic:  [] Easy bruising  [] Easy bleeding   [] Hypercoagulable state   [] Anemic  [] Hepatitis Gastrointestinal:  [] Blood in stool   [] Vomiting blood  [] Gastroesophageal reflux/heartburn   [] Difficulty swallowing. Genitourinary:  [x] Chronic kidney disease   [] Difficult urination  [] Frequent urination  [] Burning with urination   [] Blood in urine Skin:  [] Rashes   [] Ulcers   [] Wounds Psychological:  [] History of anxiety   []  History of major depression.  Physical Examination  Vitals:   03/08/18 0619  BP: (!) 155/82  Pulse: 74  Resp: 17  Temp: 98.5 F (36.9 C)  TempSrc: Temporal  SpO2: 97%  Weight: 90.7 kg  Height: 6\' 5"  (1.956 m)   Body mass index is 23.72 kg/m. Gen: WD/WN, NAD Head: [place]/AT, No temporalis wasting. Prominent temp pulse not noted. Ear/Nose/Throat: Hearing grossly intact, nares w/o erythema or drainage, oropharynx w/o Erythema/Exudate,  Eyes: Conjunctiva clear, sclera non-icteric Neck: Trachea midline.  No JVD.  Pulmonary:  Good air movement, respirations not labored, no use of accessory muscles.  Cardiac: RRR, normal S1, S2. Vascular: Tunneled catheter clean dry and intact cephalic vein visible at the wrist PD catheter in place noninfected Vessel Right Left  Radial Palpable Palpable  Ulnar Not Palpable Not Palpable  Brachial Palpable Palpable  Carotid Palpable, without bruit Palpable, without bruit   Gastrointestinal: soft, non-tender/non-distended. No guarding/reflex.  Musculoskeletal: M/S 5/5 throughout.  Extremities without ischemic changes.  No deformity or atrophy.  Neurologic: Sensation grossly intact in extremities.  Symmetrical.  Speech is fluent. Motor exam as listed above. Psychiatric: Judgment intact, Mood & affect appropriate for pt's clinical situation. Dermatologic: No rashes or ulcers noted.  No cellulitis or open wounds. Lymph : No Cervical, Axillary, or Inguinal lymphadenopathy.   CBC Lab Results  Component Value Date   WBC 11.5 (H) 02/15/2018   HGB 11.6 (L) 03/08/2018   HCT 34.0 (L) 03/08/2018   MCV 83.8 02/15/2018   PLT 327 02/15/2018    BMET    Component Value Date/Time   NA 137 03/08/2018 0638   NA 142 03/17/2016  NA 134 (L) 07/10/2013 1410   K 4.1 03/08/2018 0638   K 5.2 (H) 07/10/2013 1410   CL 99 02/15/2018 0930   CL 104 07/10/2013 1410   CO2 30 02/15/2018 0930   CO2 25 07/10/2013 1410   GLUCOSE 97 03/08/2018 0638   GLUCOSE 372 (H) 07/10/2013 1410   BUN 19 02/15/2018 0930   BUN 29 (A) 03/17/2016   BUN 47 (H) 07/10/2013 1410   CREATININE 2.85 (H) 02/15/2018 0930   CREATININE 2.06 (H) 03/13/2016 1122   CALCIUM 8.7 (L) 02/15/2018 0930   CALCIUM 8.7 07/10/2013 1410   GFRNONAA 21 (L) 02/15/2018 0930   GFRNONAA 31 (L) 03/13/2016 1122   GFRAA 24 (L) 02/15/2018 0930   GFRAA 36 (L) 03/13/2016 1122   Estimated Creatinine Clearance: 28.7 mL/min (A) (by C-G formula based on SCr of 2.85 mg/dL (H)).  COAG Lab Results  Component Value Date   INR 1.08 02/15/2018   INR 1.12 09/25/2017    Radiology No results found.  Assessment/Plan 1. End stage renal disease ([place]) Recommend:  At this time the patient does not have appropriate extremity access for dialysis  Patient should have a right radiocephalic fistula created.  This is because he had a severe gouty flare of the left  The risks, benefits and alternative therapies were reviewed in  detail with the patient.  All questions were answered.  The patient agrees to proceed with surgery.    2. Complication from renal dialysis device, sequela I will plan to remove the PD catheter at the time of surgery  3. Essential hypertension Continue antihypertensive medications as already ordered, these medications have been reviewed and there are no changes at this time.   4. Type 2 diabetes mellitus with microalbuminuria, unspecified whether long term insulin use ([place]) Continue hypoglycemic medications as already ordered, these medications have been reviewed and there are no changes at this time.  Hgb A1C to be monitored as already arranged by primary service   5. Dyslipidemia Continue statin as ordered and reviewed, no changes at this time   6. Simple chronic bronchitis (HCC) Continue pulmonary medications and aerosols as already ordered, these medications have been reviewed and there are no changes at this time.    Hortencia Pilar, MD  03/08/2018 7:22 AM

## 2018-03-15 ENCOUNTER — Ambulatory Visit: Payer: Medicare Other | Admitting: Urology

## 2018-03-16 ENCOUNTER — Other Ambulatory Visit: Payer: Self-pay | Admitting: Family Medicine

## 2018-03-16 DIAGNOSIS — N3000 Acute cystitis without hematuria: Secondary | ICD-10-CM

## 2018-03-22 ENCOUNTER — Ambulatory Visit: Payer: Medicare Other | Admitting: Urology

## 2018-03-22 ENCOUNTER — Encounter: Payer: Self-pay | Admitting: Urology

## 2018-03-22 VITALS — BP 124/80 | HR 80

## 2018-03-22 DIAGNOSIS — R3916 Straining to void: Secondary | ICD-10-CM

## 2018-03-22 DIAGNOSIS — R8271 Bacteriuria: Secondary | ICD-10-CM

## 2018-03-22 LAB — MICROSCOPIC EXAMINATION
RBC, UA: NONE SEEN /hpf (ref 0–2)
WBC, UA: >30 /hpf — ABNORMAL HIGH (ref 0–5)

## 2018-03-22 LAB — BLADDER SCAN AMB NON-IMAGING: Scan Result: 71

## 2018-03-22 LAB — URINALYSIS, COMPLETE
Bilirubin, UA: NEGATIVE
Glucose, UA: NEGATIVE
Ketones, UA: NEGATIVE
Nitrite, UA: NEGATIVE
Specific Gravity, UA: 1.02 (ref 1.005–1.030)
Urobilinogen, Ur: 0.2 mg/dL (ref 0.2–1.0)
pH, UA: 8.8 — ABNORMAL HIGH (ref 5.0–7.5)

## 2018-03-22 NOTE — Progress Notes (Signed)
Joseph Newman scheduled for cystoscopy today for a history of urinary retention and bothersome lower urinary tract symptoms.  He states his voiding symptoms have resolved and he has no complaints.  He did not want to proceed with cystoscopy.  His urine does show significant pyuria however he is asymptomatic and I suspect this is colonization.  A urine culture was ordered in the event he develops symptoms.  PVR by bladder scan was 71 mL.

## 2018-03-24 ENCOUNTER — Encounter: Payer: Self-pay | Admitting: Urology

## 2018-03-26 LAB — CULTURE, URINE COMPREHENSIVE

## 2018-03-28 ENCOUNTER — Telehealth: Payer: Self-pay

## 2018-04-01 ENCOUNTER — Ambulatory Visit (INDEPENDENT_AMBULATORY_CARE_PROVIDER_SITE_OTHER): Payer: Medicare Other

## 2018-04-01 ENCOUNTER — Ambulatory Visit (INDEPENDENT_AMBULATORY_CARE_PROVIDER_SITE_OTHER): Payer: Medicare Other | Admitting: Nurse Practitioner

## 2018-04-01 ENCOUNTER — Other Ambulatory Visit: Payer: Self-pay

## 2018-04-01 ENCOUNTER — Other Ambulatory Visit (INDEPENDENT_AMBULATORY_CARE_PROVIDER_SITE_OTHER): Payer: Self-pay | Admitting: Vascular Surgery

## 2018-04-01 ENCOUNTER — Encounter (INDEPENDENT_AMBULATORY_CARE_PROVIDER_SITE_OTHER): Payer: Self-pay | Admitting: Nurse Practitioner

## 2018-04-01 VITALS — BP 172/103 | HR 84 | Resp 10 | Ht 77.0 in | Wt 200.0 lb

## 2018-04-01 DIAGNOSIS — Z9889 Other specified postprocedural states: Secondary | ICD-10-CM | POA: Diagnosis not present

## 2018-04-01 DIAGNOSIS — I10 Essential (primary) hypertension: Secondary | ICD-10-CM

## 2018-04-01 DIAGNOSIS — Z992 Dependence on renal dialysis: Secondary | ICD-10-CM

## 2018-04-01 DIAGNOSIS — F17211 Nicotine dependence, cigarettes, in remission: Secondary | ICD-10-CM

## 2018-04-01 DIAGNOSIS — E785 Hyperlipidemia, unspecified: Secondary | ICD-10-CM

## 2018-04-01 DIAGNOSIS — Z79899 Other long term (current) drug therapy: Secondary | ICD-10-CM

## 2018-04-01 DIAGNOSIS — I120 Hypertensive chronic kidney disease with stage 5 chronic kidney disease or end stage renal disease: Secondary | ICD-10-CM

## 2018-04-01 DIAGNOSIS — N186 End stage renal disease: Secondary | ICD-10-CM

## 2018-04-01 DIAGNOSIS — J410 Simple chronic bronchitis: Secondary | ICD-10-CM

## 2018-04-01 DIAGNOSIS — R600 Localized edema: Secondary | ICD-10-CM

## 2018-04-01 NOTE — Progress Notes (Signed)
SUBJECTIVE:  Patient ID: Joseph Newman, male    DOB: [birth year], 73 y.o.   MRN: [id number] Chief Complaint  Patient presents with  . Follow-up    HPI  Joseph Newman is a 73 y.o. male The patient returns to the office for followup status post creation  of the dialysis access of the right brachial cephalic AVF.  The patient denies an increase in arm swelling. At the present time the patient denies hand pain.  The patient has not yet started to utilize this access for dialysis yet.  The patient underwent creation of this access on 03/08/2018.  The incision is completely healed.  The patient's wife also complains of swelling in her husband's lower extremities.  She states that elevation does not make him much worse but his prolonged dependency in his wheelchair makes them worse.  She denies utilizing medical grade 1 compression stockings, consistent limitation, or exercise.  The patient denies amaurosis fugax or recent TIA symptoms. There are no recent neurological changes noted. The patient denies claudication symptoms or rest pain symptoms. The patient denies history of DVT, PE or superficial thrombophlebitis. The patient denies recent episodes of angina or shortness of breath.   The patient underwent hemodialysis access duplex today which revealed a flow volume of 1632.  There were some elevated velocities near the distal upper arm and mid upper arm however there are no hemodynamically significant.     Past Medical History:  Diagnosis Date  . Acute metabolic encephalopathy 82/99/3716   Per my chart  . Arthritis   . Chronic airway obstruction, not elsewhere classified    worked in [employer] and was exposed to [employer]  . 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 Holley Raring, nephrologist.  on dialysis tu - thur - sat  . Depressive disorder, not elsewhere classified   . Dialysis patient [facility]) 01/2018   dialysis tuesday - thursday - saturday  . Gout   . Gout    . Heart murmur    followed by dr. Clayborn Bigness  . Hyperlipidemia   . Hypertension   . Hypertrophy of prostate without urinary obstruction and other lower urinary tract symptoms (LUTS)   . Impotence of organic origin   . Microalbuminuria   . 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 Obesity, unspecified   . Plantar wart   . Sleep apnea    years ago but never used a cpap  . Synovitis and tenosynovitis, unspecified   . Type II or unspecified type diabetes mellitus without mention of complication, uncontrolled   . Unspecified venous (peripheral) insufficiency   . UTI (urinary tract infection)    frequent with ESBL    Past Surgical History:  Procedure Laterality Date  . AV FISTULA PLACEMENT Right 03/08/2018   Procedure: ARTERIOVENOUS (AV) FISTULA CREATION;  Surgeon: Katha Cabal, MD;  Location: [facility];  Service: Vascular;  Laterality: Right;  . DIALYSIS/PERMA CATHETER INSERTION N/A 09/28/2017   Procedure: DIALYSIS/PERMA CATHETER INSERTION;  Surgeon: Katha Cabal, MD;  Location: [facility];  Service: Cardiovascular;  Laterality: N/A;  . DIALYSIS/PERMA CATHETER INSERTION     peritoneal catheter  . foot surgery Right    repair of calus  . REMOVAL OF A DIALYSIS CATHETER N/A 03/08/2018   Procedure: REMOVAL OF A DIALYSIS CATHETER;  Surgeon: Katha Cabal, MD;  Location: [facility];  Service: Vascular;  Laterality: N/A;    Social History   Socioeconomic History  . Marital status:  Married    Spouse name: frances  . Number of children: 2  . Years of education: Not on file  . Highest education level: Not on file  Occupational History  . Occupation:  Engineer, manufacturing systems,     Comment: retired  Scientific laboratory technician  . Financial resource strain: Somewhat hard  . Food insecurity:    Worry: Never true    Inability: Never true  . Transportation needs:    Medical: No    Non-medical: No  Tobacco Use  . Smoking status: Former Smoker    Packs/day: 0.25    Years: 30.00     Pack years: 7.50    Types: Cigarettes, Cigars    Last attempt to quit: 07/23/2017    Years since quitting: 0.6  . Smokeless tobacco: Never Used  Substance and Sexual Activity  . Alcohol use: Yes    Alcohol/week: 0.0 standard drinks    Comment: occasional  . Drug use: No  . Sexual activity: Not Currently  Lifestyle  . Physical activity:    Days per week: 0 days    Minutes per session: 0 min  . Stress: Not on file  Relationships  . Social connections:    Talks on phone: Twice a week    Gets together: Once a week    Attends religious service: Never    Active member of club or organization: No    Attends meetings of clubs or organizations: Never    Relationship status: Married  . Intimate partner violence:    Fear of current or ex partner: No    Emotionally abused: No    Physically abused: No    Forced sexual activity: No  Other Topics Concern  . Not on file  Social History Narrative   Lives with wife   Very poor balance, uses wheelchair    Family History  Problem Relation Age of Onset  . Hypertension Mother   . Kidney disease Neg Hx   . Prostate cancer Neg Hx     No Known Allergies   Review of Systems   Review of Systems: Negative Unless Checked Constitutional: [] Weight loss  [] Fever  [] Chills Cardiac: [] Chest pain   []  Atrial Fibrillation  [] Palpitations   [] Shortness of breath when laying flat   [] Shortness of breath with exertion. [] Shortness of breath at rest Vascular:  [] Pain in legs with walking   [] Pain in legs with standing [] Pain in legs when laying flat   [] Claudication    [] Pain in feet when laying flat    [] History of DVT   [] Phlebitis   [x] Swelling in legs   [] Varicose veins   [] Non-healing ulcers Pulmonary:   [] Uses home oxygen   [] Productive cough   [] Hemoptysis   [] Wheeze  [] COPD   [] Asthma Neurologic:  [] Dizziness   [] Seizures  [] Blackouts [] History of stroke   [] History of TIA  [] Aphasia   [] Temporary Blindness   [] Weakness or numbness in arm    [x] Weakness or numbness in leg Musculoskeletal:   [] Joint swelling   [] Joint pain   [] Low back pain  []  History of Knee Replacement [] Arthritis [] back Surgeries  []  Spinal Stenosis    Hematologic:  [] Easy bruising  [] Easy bleeding   [] Hypercoagulable state   [x] Anemic Gastrointestinal:  [] Diarrhea   [] Vomiting  [] Gastroesophageal reflux/heartburn   [] Difficulty swallowing. [] Abdominal pain Genitourinary:  [x] Chronic kidney disease   [] Difficult urination  [] Anuric   [] Blood in urine [] Frequent urination  [] Burning with urination   [] Hematuria Skin:  [] Rashes   []   Ulcers [] Wounds Psychological:  [] History of anxiety   []  History of major depression  []  Memory Difficulties      OBJECTIVE:   Physical Exam  BP (!) 172/103 (BP Location: Left Wrist, Patient Position: Sitting, Cuff Size: Small)   Pulse 84   Resp 10   Ht 6' 5"  (1.956 m)   Wt 200 lb (90.7 kg)   BMI 23.72 kg/m   Gen: WD/WN, NAD Head: [place]/AT, No temporalis wasting.  Ear/Nose/Throat: Hearing grossly intact, nares w/o erythema or drainage Eyes: PER, EOMI, sclera nonicteric.  Neck: Supple, no masses.  No JVD.  Pulmonary:  Good air movement, no use of accessory muscles.  Cardiac: RRR Vascular: good thrill and bruit. 2+ lower extremity edema  Vessel Right Left  Radial Palpable Palpable   Gastrointestinal: soft, non-distended. No guarding/no peritoneal signs.  Musculoskeletal: Uses wheelchair for transportation  No deformity or atrophy.  Neurologic: Pain and light touch intact in extremities.  Symmetrical.  Speech is fluent. Motor exam as listed above. Psychiatric: Judgment intact, Mood & affect appropriate for pt's clinical situation. Dermatologic: No Venous rashes. No Ulcers Noted.  No changes consistent with cellulitis. Lymph : No Cervical lymphadenopathy, no lichenification or skin changes of chronic lymphedema.       ASSESSMENT AND PLAN:  1. End stage renal disease ([place]) Recommend:  The patient has not yet utilize  their dialysis access.  I have advised him that it typically requires at least 3 full sessions using both needles in order to have the PermCath removed.  I notified him that their dialysis center will contact us when it is appropriate for removal and we will contact him to schedule the procedure.  They were given a letter today to indicate that dialysis access can start on or after 04/15/2018.  This was also faxed directly to dialysis center for your convenience as well.  The patient will follow-up with me in the office in 6 months with an HDA   2. Essential hypertension Continue antihypertensive medications as already ordered, these medications have been reviewed and there are no changes at this time.   3. Simple chronic bronchitis (HCC) Continue pulmonary medications and aerosols as already ordered, these medications have been reviewed and there are no changes at this time.    4. Dyslipidemia Continue statin as ordered and reviewed, no changes at this time   5. Lower extremity edema I have had a long discussion with the patient regarding swelling and why it  causes symptoms.  Patient will begin wearing graduated compression stockings class 1 (20-30 mmHg) on a daily basis a prescription was given. The patient will  beginning wearing the stockings first thing in the morning and removing them in the evening. The patient is instructed specifically not to sleep in the stockings.   In addition, behavioral modification will be initiated.  This will include frequent elevation, use of over the counter pain medications and exercise such as walking.  I have reviewed systemic causes for chronic edema such as liver, kidney and cardiac etiologies.  The patient denies problems with these organ systems.    Consideration for a lymph pump will also be made based upon the effectiveness of conservative therapy.  This would help to improve the edema control and prevent sequela such as ulcers and infections    Patient will attempt to utilize conservative methods for leg swelling.  If these conservative methods are not effective I have advised the patient and wife to contact us so that we can  schedule an ultrasound to see what other therapies are available to assist with edema.    Current Outpatient Medications on File Prior to Visit  Medication Sig Dispense Refill  . allopurinol (ZYLOPRIM) 100 MG tablet GIVE .5 TABLET BY MOUTH ONE TIME A DAY EVERY OTHER DAY FOR GOUT (Patient taking differently: Take 50 mg by mouth every other day. ) 15 tablet 2  . aspirin EC 81 MG tablet Take 81 mg by mouth daily.    Marland Kitchen atorvastatin (LIPITOR) 40 MG tablet Take 1 tablet (40 mg total) by mouth daily. 90 tablet 0  . Blood Glucose Monitoring Suppl (ONE TOUCH ULTRA SYSTEM KIT) W/DEVICE KIT 1 kit by Does not apply route once.    . calcitRIOL (ROCALTROL) 0.25 MCG capsule Take 0.25 mcg by mouth daily.     . carvedilol (COREG) 6.25 MG tablet Take 6.25 mg by mouth 2 (two) times daily with a meal.     . cholecalciferol (VITAMIN D3) 25 MCG (1000 UT) tablet Take 2 capsules by mouth daily.     . Cyanocobalamin (VITAMIN B-12) 1000 MCG SUBL Take 1 tablet by mouth daily.    Marland Kitchen docusate sodium (COLACE) 100 MG capsule Take 100 mg by mouth daily.    . Multiple Vitamin (MULTIVITAMINS PO) Take 1 tablet by mouth daily.    . Nutritional Supplements (FEEDING SUPPLEMENT, NEPRO CARB STEADY,) LIQD Take 237 mLs by mouth 3 (three) times daily between meals. 90 Can 0  . Olmesartan-amLODIPine-HCTZ 40-10-25 MG TABS Take 1 tablet by mouth daily. 30 tablet 0  . tamsulosin (FLOMAX) 0.4 MG CAPS capsule TAKE 1 CAPSULE (0.4 MG TOTAL) BY MOUTH DAILY AFTER BREAKFAST. 30 capsule 0  . TRULICITY 1.5 RA/7.4AD SOPN Inject 1.5 mg into the skin See admin instructions. Injects on Sundays    . ciprofloxacin (CIPRO) 500 MG tablet Take 1 tablet (500 mg total) by mouth every 12 (twelve) hours. Take the day prior to cysto appointment (Patient not taking: Reported on  04/01/2018) 2 tablet 0  . HYDROcodone-acetaminophen (NORCO) 5-325 MG tablet Take 1-2 tablets by mouth every 6 (six) hours as needed. (Patient not taking: Reported on 04/01/2018) 30 tablet 0  . ondansetron (ZOFRAN ODT) 4 MG disintegrating tablet Take 1 tablet (4 mg total) by mouth every 8 (eight) hours as needed for nausea or vomiting. (Patient not taking: Reported on 04/01/2018) 20 tablet 0   No current facility-administered medications on file prior to visit.     There are no Patient Instructions on file for this visit. No follow-ups on file.   Kris Hartmann, NP  This note was completed with Sales executive.  Any errors are purely unintentional.

## 2018-04-02 ENCOUNTER — Ambulatory Visit (INDEPENDENT_AMBULATORY_CARE_PROVIDER_SITE_OTHER): Payer: Medicare Other

## 2018-04-02 DIAGNOSIS — N186 End stage renal disease: Secondary | ICD-10-CM | POA: Diagnosis not present

## 2018-04-02 DIAGNOSIS — R809 Proteinuria, unspecified: Secondary | ICD-10-CM | POA: Diagnosis not present

## 2018-04-02 DIAGNOSIS — E1129 Type 2 diabetes mellitus with other diabetic kidney complication: Secondary | ICD-10-CM | POA: Diagnosis not present

## 2018-04-02 NOTE — Chronic Care Management (AMB) (Signed)
Chronic Care Management   Follow Up Note   04/02/2018 Name: Joseph Newman MRN: [id number] DOB: 05-31-1945  Referred by: Steele Sizer, MD Reason for referral : Chronic Care Management (follow up DM/dialysis)  [name] a 50year oldmale who seesDr. Laure Kidney primary care.Dr. Lenox Ahr the CCM team to consult the patient for assistance with chronic disease management related totransportation needs.Patient has a medical history of HTN, COPD, DM with A1C 7.3, Nephropathy, CKD requiring dialysis.Referral was placed12/18/19.Today CCM RN CM followed up with patient's wife to discuss goal progression and assessment.   Review of patient status, including review of consultants reports, relevant laboratory and other test results, and collaboration with appropriate care team members and the patient's provider was performed as part of comprehensive patient evaluation and provision of chronic care management services.    Goals Addressed            This Visit's Progress   . COMPLETED: "my diabetes is doing pretty good" (pt-stated)       Per Wife, patients fasting CBG readings range from 107 to 130. His appetite has picked up and she feels he is increasing his body weight. She understands the difference between fluid weight and body weight. [name] continues to take his diabetic medications as prescribed.   Nurse Case Manager Clinical Goal(s): Over the next 30 days patient will continue to report fasting blood glucose readings below 140  Interventions:  . Commended patient/wife on dietary discretions and maintaining control of diabetes  . Encouraged continued monitoring and medication compliance   Patient Self Care Activities: ex. Takes medications (independent), administers insulin (in progress), checks CBG daily (independent) . Patient/wife will continue to check CBG daily  Please see past updates related to this goal by clicking on the "Past Updates" button in the  selected goal   Pharmacist Clinical Goal(s): Over the next 30 days, patient, pharmacist, and provider, will work together to optimize diabetes pharmacotherapy to a plan that controls CBG but doesn't contribute to weight loss, feelings of satiety, or other GI side effects.   Interventions: . Recommend switching from Trulicity (a GLP-1 medication that can cause weight loss and lack of appetite) to pioglitazone (no renal adjustment)  02/14/2018 Encouraged wife make follow up appointment with endocrinologist for follow up  Patient Self Care Activities: ex. Takes medications (independent), administers insulin (in progress), checks CBG daily (independent) . Continue checking CBG's daily . Take all medications as prescribed  Please see past updates related to this goal by clicking on the "Past Updates" button in the selected goal        . I would like to work on walking again (pt-stated)   On track    Per wife, [name] is walking "a lot better". She states he is walking with his walker and she only utilizes his wheelchair post dialysis. She states previously he would request to go to bed after dialysis but now he wishes to stay up once they return home and ambulates in the house with his walker. Wife feels as the weather warms, [name] will rapidly improve as he enjoys being in his garage and outside.   Current Barriers:  Knowledge Deficits related to benefits of daily activity  Chronic Health Conditions  Significant deconditioning from past hospitalizations  Nurse Case Manager Clinical Goal(s):  Over the next 30 days, patient will not experience hospital admission. Hospital Admissions in last 6 months = 1 Over the next 30 days, patient will attend all scheduled medical appointments: dialysis  T/Th/S Over the next 30 days, patient will demonstrate improved adherence to prescribed treatment plan for addressing deconditioning as evidenced bycargiver verbalizing patient's willingness to ambulate  more frequently utilizing his walker  Interventions:  Discussed plans with patient for ongoing care management follow up and provided patient with direct contact information for care management team  Patient Self Care Activities:  Currently UNABLE TO independently ambulate without walker  Plan:  RNCM will follow up with patient/caregiver in 30 days   Please see past updates related to this goal by clicking on the "Past Updates" button in the selected goal  .    Please see past updates related to this goal by clicking on the "Past Updates" button in the selected goal       . per wife "He is having a lot of swelling in his ankles and feet" (pt-stated)       Wife states patient is having a significant amount of lower extremity swelling. This has been observed and discussed by Vascular Physician. Patient has been instructed to utilize compression stockings and elevation as an initial intervention. Per vascular note, patient may be a candidate for lymphedema pump if symptoms do not improve with compression stockings.   Current Barriers:  Marland Kitchen Knowledge Deficits related to preventative strategies related to lower extremity edema  Nurse Case Manager Clinical Goal(s):  Marland Kitchen Over the next 30 days, patient will demonstrate improved adherence to prescribed treatment plan for lower extremity edema as evidenced by caregiver reporting patient is more compliant with compression stocking application and elevation of lower extremities during rest.  Interventions:  . Provided education to patient re: proper compression hose application procedure  . Discussed importance of lower extremity elevation as a fluid reduction strategy . Reviewed complications that may occur if lower extremity edema is not addressed such as weeping skin and cellulitis.  Patient Self Care Activities:  . Independent application and removal of compression stocking  Plan:  . Patient will adhere to plan of care related to lower  extremity edema . RNCM will follow up on compliance in 30 days   Initial goal documentation         Telephone follow up appointment with CCM team member scheduled for: 30 days     Catherine Cubero E. Rollene Rotunda, RN, BSN Nurse Care Coordinator [facility] Care Management  [phone]

## 2018-04-02 NOTE — Patient Instructions (Signed)
Thank you allowing the Chronic Care Management Team to be a part of your care! It was a pleasure speaking with you today!  1. Please utilize compression hose and leg elevation daily to reduce swelling in your lower legs. 2. You may need to reduce your sodium intake if you are eating salty foods such as chips, deli meats, and restaurant foods often. 3. Take all medications as prescribed 4. Continue to adhere to your dialysis schedule. 5. Walk as much as possible. I am so happy you are walking more than you were when we first met. Keep up the good work. 6. Continue to check your blood sugars daily. 7. Contact the CCM Team with any questions or concerns  CCM (Chronic Care Management) Team   Trish Fountain RN, BSN Nurse Care Coordinator  [phone]  Ruben Reason PharmD  Clinical Pharmacist  [phone]   [place], LCSW Clinical Social Worker [phone]  Goals Addressed            This Visit's Progress   . COMPLETED: "my diabetes is doing pretty good" (pt-stated)       Nurse Case Manager Clinical Goal(s): Over the next 30 days patient will continue to report fasting blood glucose readings below 140  Interventions:  . Commended patient/wife on dietary discretions and maintaining control of diabetes  . Encouraged continued monitoring and medication compliance   Patient Self Care Activities: ex. Takes medications (independent), administers insulin (in progress), checks cbg daily (independent) . Patient/wife will continue to check cbg dialy  Please see past updates related to this goal by clicking on the "Past Updates" button in the selected goal   Pharmacist Clinical Goal(s): Over the next 30 days, patient, pharmacist, and provider, will work together to optimize diabetes pharmacotherapy to a plan that controls CBG but doesn't contribute to weight loss, feelings of satiety, or other GI side effects.   Interventions: . Recommend switching from Trulicity (a GLP-1  medication that can cause weight loss and lack of appetite) to pioglitazone (no renal adjustment)  02/14/2018 Encouraged wife make follow up appointment with endocrinologist for follow up  Patient Self Care Activities: ex. Takes medications (independent), administers insulin (in progress), checks cbg daily (independent) . Continue checking CBGs daily . Take all medications as prescribed  Please see past updates related to this goal by clicking on the "Past Updates" button in the selected goal        . I would like to work on walking again (pt-stated)   On track    Current Barriers:  Knowledge Deficits related to benefits of daily activity  Chronic Health Conditions  Significant deconditioning from past hospitalizations  Nurse Case Manager Clinical Goal(s):  Over the next 30 days, patient will not experience hospital admission. Hospital Admissions in last 6 months = 1 Over the next 30 days, patient will attend all scheduled medical appointments: dialysis T/Th/S Over the next 30 days, patient will demonstrate improved adherence to prescribed treatment plan for addressing deconditioning as evidenced bycargiver verbalizing patient's willingness to ambulate more frequently utilizing his walker  Interventions:  Discussed plans with patient for ongoing care management follow up and provided patient with direct contact information for care management team  Patient Self Care Activities:  Currently UNABLE TO independently ambulate without walker  Plan:  RNCM will follow up with patient/caregiver in 30 days   Please see past updates related to this goal by clicking on the "Past Updates" button in the selected goal  .  Please see past updates related to this goal by clicking on the "Past Updates" button in the selected goal       . per wife "He is having a lot of swelling in his ankles and feet" (pt-stated)       Current Barriers:  Marland Kitchen Knowledge Deficits related to preventative  stratagies related to lower extremity edema  Nurse Case Manager Clinical Goal(s):  Marland Kitchen Over the next 30 days, patient will demonstrate improved adherence to prescribed treatment plan for lower extremity edema as evidenced by caregiver reporting patient is more compliant with compression stocking application and elevation of lower extremities during rest.  Interventions:  . Provided education to patient re: proper compression hose application procedure  . Discussed importance of lower extremity elevation as a fluid reduction strategy . Reviewed complications that may occur if lower extremity edema is not addressed such as weeping skin and cellulitis.  Patient Self Care Activities:  . Independent application and removal of compression stocking  Plan:  . Patient will adhere to plan of care related to lower extremity edem . RNCM will follow up on compliance in 30 days   Initial goal documentation        The patient's wife/caregiver verbalized understanding of instructions provided today and declined a print copy of patient instruction materials.   Telephone follow up appointment with CCM team member scheduled for: 30 days

## 2018-04-03 ENCOUNTER — Telehealth (INDEPENDENT_AMBULATORY_CARE_PROVIDER_SITE_OTHER): Payer: Self-pay | Admitting: Nurse Practitioner

## 2018-04-03 ENCOUNTER — Other Ambulatory Visit (INDEPENDENT_AMBULATORY_CARE_PROVIDER_SITE_OTHER): Payer: Self-pay | Admitting: Nurse Practitioner

## 2018-04-03 DIAGNOSIS — N186 End stage renal disease: Secondary | ICD-10-CM

## 2018-04-03 DIAGNOSIS — Z992 Dependence on renal dialysis: Principal | ICD-10-CM

## 2018-04-03 DIAGNOSIS — T829XXD Unspecified complication of cardiac and vascular prosthetic device, implant and graft, subsequent encounter: Secondary | ICD-10-CM

## 2018-04-05 ENCOUNTER — Ambulatory Visit (INDEPENDENT_AMBULATORY_CARE_PROVIDER_SITE_OTHER): Payer: Medicare Other

## 2018-04-05 ENCOUNTER — Other Ambulatory Visit: Payer: Self-pay

## 2018-04-05 DIAGNOSIS — Z992 Dependence on renal dialysis: Secondary | ICD-10-CM

## 2018-04-05 DIAGNOSIS — N186 End stage renal disease: Secondary | ICD-10-CM

## 2018-04-05 DIAGNOSIS — T829XXD Unspecified complication of cardiac and vascular prosthetic device, implant and graft, subsequent encounter: Secondary | ICD-10-CM

## 2018-04-09 ENCOUNTER — Other Ambulatory Visit: Payer: Self-pay | Admitting: Family Medicine

## 2018-04-09 DIAGNOSIS — N3000 Acute cystitis without hematuria: Secondary | ICD-10-CM

## 2018-04-09 NOTE — Telephone Encounter (Signed)
Refill request for general medication: Tamsulosin 0.4 mg  Last office visit: 01/30/2018  Follow-ups on file. None indicated

## 2018-04-25 ENCOUNTER — Other Ambulatory Visit: Payer: Self-pay | Admitting: Family Medicine

## 2018-04-26 NOTE — Telephone Encounter (Signed)
Refill request for general medication. Allopurinol to [facility]  Last office visit 01/09/2018   Follow up on 05/03/2018

## 2018-05-03 ENCOUNTER — Ambulatory Visit: Payer: Medicare Other

## 2018-05-07 ENCOUNTER — Ambulatory Visit: Payer: Self-pay

## 2018-05-07 ENCOUNTER — Other Ambulatory Visit: Payer: Self-pay

## 2018-05-07 DIAGNOSIS — R809 Proteinuria, unspecified: Principal | ICD-10-CM

## 2018-05-07 DIAGNOSIS — N186 End stage renal disease: Secondary | ICD-10-CM

## 2018-05-07 DIAGNOSIS — E1129 Type 2 diabetes mellitus with other diabetic kidney complication: Secondary | ICD-10-CM

## 2018-05-07 DIAGNOSIS — I10 Essential (primary) hypertension: Secondary | ICD-10-CM

## 2018-05-07 NOTE — Chronic Care Management (AMB) (Signed)
Chronic Care Management   Follow Up Note   05/07/2018 Name: Marrion Accomando MRN: [id number] DOB: 04/14/1945  Referred by: Steele Sizer, MD Reason for referral : Chronic Care Management (follow up care coordination/chronic conditions)   Subjective: per wife "Mr. Sneath is doing so much better" "He is at dialysis right now"   Objective:  Lab Results  Component Value Date   HGBA1C 7.3 (H) 09/29/2017   BP Readings from Last 3 Encounters:     03/22/18 124/80  03/08/18 122/61   Assessment:  Mr. Javontay Hopeis a 57year oldmale who seesDr. Laure Kidney primary care.Dr. Lenox Ahr the CCM team to consult the patient for assistance with chronic disease management related totransportation needs.Patient has a medical history of HTN, COPD, DM with A1C 7.3, Nephropathy, CKD requiring dialysis.Referral was placed12/18/19.TodayCCM RN CM followed up with patient's wife to discuss goal progression and assessment.  Goals Addressed            This Visit's Progress   . COMPLETED: I would like to work on walking again (pt-stated)       Ms. Varnadore states Mr. Luten is doing much better than he was when [facility] Team first met with patient and wife. He is now completing ADLs independent of assistance from wife. She is no longer having to use the hoyer lift to get him out of bed as he is able to independently and safely transfer himself. He utilizes his rolator for all ambulation and they have taken precautions in the home to prevent falls such as removing cluttering and scatter rugs and the home is well lit. He walks for exercise daily and no longer requires bedrest post dialysis.   Current Barriers:  Knowledge Deficits related to benefits of daily activity  Chronic Health Conditions  Significant deconditioning from past hospitalizations  Nurse Case Manager Clinical Goal(s):  Over the next 30 days, patient will not experience hospital admission. Hospital Admissions in last 6 months = 1 Over  the next 30 days, patient will attend all scheduled medical appointments: dialysis T/Th/S Over the next 30 days, patient will demonstrate improved adherence to prescribed treatment plan for addressing deconditioning as evidenced bycargiver verbalizing patient's willingness to ambulate more frequently utilizing his walker  Interventions:  Discussed plans with patient for ongoing care management follow up and provided patient with direct contact information for care management team  Patient Self Care Activities:  Currently UNABLE TO independently ambulate without walker  Plan:  RNCM will follow up with patient/caregiver in 30 days   Please see past updates related to this goal by clicking on the "Past Updates" button in the selected goal  .    Please see past updates related to this goal by clicking on the "Past Updates" button in the selected goal       . COMPLETED: per wife "He is having a lot of swelling in his ankles and feet" (pt-stated)       Ms. Skillman states that patient legs continue to swell however swelling has improved with elevations, low NA diet, and rest. States she was having a hard time placing compression stockings on patient until she was given a sock aid by a friend. States Mr. Pinkham does not like to recline in his recliner and had rather sit up during the day. She encourages him to elevate his legs as much as possible. She understands the importance of fluid balance and monitoring his intake and output as patient is not anuric although he is dialyzed three times a  week. She feels she has been provided education needed to help patient successfully manage his lower extremity edema without acute hospitalization or ED visit.   Current Barriers:  Marland Kitchen Knowledge Deficits related to preventative stratagies related to lower extremity edema  Nurse Case Manager Clinical Goal(s):  Marland Kitchen Over the next 30 days, patient will demonstrate improved adherence to prescribed treatment plan for  lower extremity edema as evidenced by caregiver reporting patient is more compliant with compression stocking application and elevation of lower extremities during rest.  Interventions:  . Provided education to patient re: proper compression hose application procedure  . Discussed importance of lower extremity elevation as a fluid reduction strategy . Reviewed complications that may occur if lower extremity edema is not addressed such as weeping skin and cellulitis.  Patient Self Care Activities:  . Independent application and removal of compression stocking  Plan:  . Patient will adhere to plan of care related to lower extremity edem . RNCM will follow up on compliance in 30 days   Initial goal documentation          No further follow up required: patient has met goals. Wife declined ongoing monitoring by CCM RN CM but would like the ability to contact CCM RN CM if additional needs arise. The patient/wife has been provided with contact information for the chronic care management team and has been advised to call with any health related questions or concerns.      Hannalee Castor E. Rollene Rotunda, RN, BSN Nurse Care Coordinator [facility] Care Management  [phone]

## 2018-05-07 NOTE — Patient Instructions (Signed)
  Thank you allowing the Chronic Care Management Team to be a part of your care! It was a pleasure speaking with you today!  1. Continue to take medications as prescribed and check your BP and blood sugars as directed.  2. Contact your provider (Dr. Ancil Boozer) or nephrologist with any readings outside discussed parameters 3. Continue to attend all medical provider appointments 4. Wear your compression hose daily and remove at bedtime 5. Keep your legs elevated as much as possible 6. Maintain a renal diet which includes low sodium foods 7. Please contact the CCM Team with any additional needs or concerns  CCM (Chronic Care Management) Team   Trish Fountain RN, BSN Nurse Care Coordinator  [phone]  Ruben Reason PharmD  Clinical Pharmacist  [phone]   [place], LCSW Clinical Social Worker [phone]  Goals Addressed            This Visit's Progress   . COMPLETED: I would like to work on walking again (pt-stated)       Current Barriers:  Knowledge Deficits related to benefits of daily activity  Chronic Health Conditions  Significant deconditioning from past hospitalizations  Nurse Case Manager Clinical Goal(s):  Over the next 30 days, patient will not experience hospital admission. Hospital Admissions in last 6 months = 1 Over the next 30 days, patient will attend all scheduled medical appointments: dialysis T/Th/S Over the next 30 days, patient will demonstrate improved adherence to prescribed treatment plan for addressing deconditioning as evidenced bycargiver verbalizing patient's willingness to ambulate more frequently utilizing his walker  Interventions:  Discussed plans with patient for ongoing care management follow up and provided patient with direct contact information for care management team  Patient Self Care Activities:  Currently UNABLE TO independently ambulate without walker  Plan:  RNCM will follow up with patient/caregiver in 30  days   Please see past updates related to this goal by clicking on the "Past Updates" button in the selected goal  .    Please see past updates related to this goal by clicking on the "Past Updates" button in the selected goal       . COMPLETED: per wife "He is having a lot of swelling in his ankles and feet" (pt-stated)       Current Barriers:  Marland Kitchen Knowledge Deficits related to preventative stratagies related to lower extremity edema  Nurse Case Manager Clinical Goal(s):  Marland Kitchen Over the next 30 days, patient will demonstrate improved adherence to prescribed treatment plan for lower extremity edema as evidenced by caregiver reporting patient is more compliant with compression stocking application and elevation of lower extremities during rest.  Interventions:  . Provided education to patient re: proper compression hose application procedure  . Discussed importance of lower extremity elevation as a fluid reduction strategy . Reviewed complications that may occur if lower extremity edema is not addressed such as weeping skin and cellulitis.  Patient Self Care Activities:  . Independent application and removal of compression stocking  Plan:  . Patient will adhere to plan of care related to lower extremity edem . RNCM will follow up on compliance in 30 days   Initial goal documentation        The patient/wife verbalized understanding of instructions provided today and declined a print copy of patient instruction materials.   No further follow up required: Patient has met health goals

## 2018-05-09 ENCOUNTER — Telehealth: Payer: Self-pay | Admitting: Urology

## 2018-05-09 NOTE — Telephone Encounter (Signed)
Pt's wife noticed blood in pt's urine.  He is a dialysys pt and she wants to know if this is normal.  Please give wife a call.

## 2018-05-09 NOTE — Telephone Encounter (Signed)
Phone message sent to ultrasound

## 2018-05-09 NOTE — Telephone Encounter (Signed)
Spoke with patient's wife and she states patient had kool aid red colored blood this morning denies fever, chills, dysuria, frequency or urgency. How should he proceed for follow up?

## 2018-05-10 NOTE — Telephone Encounter (Signed)
Recommend scheduling cystoscopy within the next 6 weeks

## 2018-05-13 NOTE — Telephone Encounter (Signed)
App made and mailed to patient  Joseph Newman

## 2018-05-15 ENCOUNTER — Emergency Department: Payer: Medicare Other

## 2018-05-15 ENCOUNTER — Other Ambulatory Visit: Payer: Self-pay

## 2018-05-15 ENCOUNTER — Inpatient Hospital Stay
Admission: EM | Admit: 2018-05-15 | Discharge: 2018-05-24 | DRG: 480 | Disposition: A | Discharge status: Skilled Nursing Facility | Payer: Medicare Other | Attending: Internal Medicine | Admitting: Internal Medicine

## 2018-05-15 ENCOUNTER — Ambulatory Visit: Payer: Self-pay

## 2018-05-15 DIAGNOSIS — F329 Major depressive disorder, single episode, unspecified: Secondary | ICD-10-CM | POA: Diagnosis present

## 2018-05-15 DIAGNOSIS — J449 Chronic obstructive pulmonary disease, unspecified: Secondary | ICD-10-CM | POA: Diagnosis present

## 2018-05-15 DIAGNOSIS — I4892 Unspecified atrial flutter: Secondary | ICD-10-CM | POA: Diagnosis present

## 2018-05-15 DIAGNOSIS — Z7982 Long term (current) use of aspirin: Secondary | ICD-10-CM

## 2018-05-15 DIAGNOSIS — Z79899 Other long term (current) drug therapy: Secondary | ICD-10-CM

## 2018-05-15 DIAGNOSIS — I4891 Unspecified atrial fibrillation: Secondary | ICD-10-CM | POA: Diagnosis not present

## 2018-05-15 DIAGNOSIS — I120 Hypertensive chronic kidney disease with stage 5 chronic kidney disease or end stage renal disease: Secondary | ICD-10-CM | POA: Diagnosis present

## 2018-05-15 DIAGNOSIS — S72011A Unspecified intracapsular fracture of right femur, initial encounter for closed fracture: Principal | ICD-10-CM | POA: Diagnosis present

## 2018-05-15 DIAGNOSIS — W1830XA Fall on same level, unspecified, initial encounter: Secondary | ICD-10-CM | POA: Diagnosis present

## 2018-05-15 DIAGNOSIS — M1611 Unilateral primary osteoarthritis, right hip: Secondary | ICD-10-CM | POA: Diagnosis present

## 2018-05-15 DIAGNOSIS — Z1159 Encounter for screening for other viral diseases: Secondary | ICD-10-CM

## 2018-05-15 DIAGNOSIS — J9601 Acute respiratory failure with hypoxia: Secondary | ICD-10-CM | POA: Diagnosis present

## 2018-05-15 DIAGNOSIS — N186 End stage renal disease: Secondary | ICD-10-CM

## 2018-05-15 DIAGNOSIS — Z419 Encounter for procedure for purposes other than remedying health state, unspecified: Secondary | ICD-10-CM

## 2018-05-15 DIAGNOSIS — M109 Gout, unspecified: Secondary | ICD-10-CM | POA: Diagnosis present

## 2018-05-15 DIAGNOSIS — Y92009 Unspecified place in unspecified non-institutional (private) residence as the place of occurrence of the external cause: Secondary | ICD-10-CM

## 2018-05-15 DIAGNOSIS — S72001A Fracture of unspecified part of neck of right femur, initial encounter for closed fracture: Secondary | ICD-10-CM

## 2018-05-15 DIAGNOSIS — E1122 Type 2 diabetes mellitus with diabetic chronic kidney disease: Secondary | ICD-10-CM | POA: Diagnosis present

## 2018-05-15 DIAGNOSIS — N2581 Secondary hyperparathyroidism of renal origin: Secondary | ICD-10-CM | POA: Diagnosis present

## 2018-05-15 DIAGNOSIS — M25551 Pain in right hip: Secondary | ICD-10-CM | POA: Diagnosis present

## 2018-05-15 DIAGNOSIS — R0902 Hypoxemia: Secondary | ICD-10-CM

## 2018-05-15 DIAGNOSIS — E785 Hyperlipidemia, unspecified: Secondary | ICD-10-CM | POA: Diagnosis present

## 2018-05-15 DIAGNOSIS — G4730 Sleep apnea, unspecified: Secondary | ICD-10-CM | POA: Diagnosis present

## 2018-05-15 DIAGNOSIS — Z992 Dependence on renal dialysis: Secondary | ICD-10-CM

## 2018-05-15 DIAGNOSIS — R000 Tachycardia, unspecified: Secondary | ICD-10-CM | POA: Diagnosis present

## 2018-05-15 DIAGNOSIS — Z87891 Personal history of nicotine dependence: Secondary | ICD-10-CM

## 2018-05-15 DIAGNOSIS — R319 Hematuria, unspecified: Secondary | ICD-10-CM | POA: Diagnosis not present

## 2018-05-15 DIAGNOSIS — J811 Chronic pulmonary edema: Secondary | ICD-10-CM | POA: Diagnosis present

## 2018-05-15 DIAGNOSIS — Z79891 Long term (current) use of opiate analgesic: Secondary | ICD-10-CM

## 2018-05-15 DIAGNOSIS — N400 Enlarged prostate without lower urinary tract symptoms: Secondary | ICD-10-CM | POA: Diagnosis present

## 2018-05-15 DIAGNOSIS — R509 Fever, unspecified: Secondary | ICD-10-CM

## 2018-05-15 DIAGNOSIS — Z8249 Family history of ischemic heart disease and other diseases of the circulatory system: Secondary | ICD-10-CM

## 2018-05-15 DIAGNOSIS — D631 Anemia in chronic kidney disease: Secondary | ICD-10-CM | POA: Diagnosis present

## 2018-05-15 DIAGNOSIS — L8990 Pressure ulcer of unspecified site, unspecified stage: Secondary | ICD-10-CM

## 2018-05-15 LAB — CBC WITH DIFFERENTIAL/PLATELET
Abs Immature Granulocytes: 0.02 10*3/uL (ref 0.00–0.07)
Basophils Absolute: 0 10*3/uL (ref 0.0–0.1)
Basophils Relative: 1 %
Eosinophils Absolute: 0.4 10*3/uL (ref 0.0–0.5)
Eosinophils Relative: 5 %
HCT: 35.9 % — ABNORMAL LOW (ref 39.0–52.0)
Hemoglobin: 11.8 g/dL — ABNORMAL LOW (ref 13.0–17.0)
Immature Granulocytes: 0 %
Lymphocytes Relative: 20 %
Lymphs Abs: 1.5 10*3/uL (ref 0.7–4.0)
MCH: 28.4 pg (ref 26.0–34.0)
MCHC: 32.9 g/dL (ref 30.0–36.0)
MCV: 86.3 fL (ref 80.0–100.0)
Monocytes Absolute: 0.5 10*3/uL (ref 0.1–1.0)
Monocytes Relative: 7 %
Neutro Abs: 5.2 10*3/uL (ref 1.7–7.7)
Neutrophils Relative %: 67 %
Platelets: 209 10*3/uL (ref 150–400)
RBC: 4.16 MIL/uL — ABNORMAL LOW (ref 4.22–5.81)
RDW: 15.1 % (ref 11.5–15.5)
WBC: 7.6 10*3/uL (ref 4.0–10.5)
nRBC: 0 % (ref 0.0–0.2)

## 2018-05-15 LAB — COMPREHENSIVE METABOLIC PANEL
ALT: 13 U/L (ref 0–44)
AST: 19 U/L (ref 15–41)
Albumin: 3.2 g/dL — ABNORMAL LOW (ref 3.5–5.0)
Alkaline Phosphatase: 87 U/L (ref 38–126)
Anion gap: 14 (ref 5–15)
BUN: 41 mg/dL — ABNORMAL HIGH (ref 8–23)
CO2: 25 mmol/L (ref 22–32)
Calcium: 8.4 mg/dL — ABNORMAL LOW (ref 8.9–10.3)
Chloride: 96 mmol/L — ABNORMAL LOW (ref 98–111)
Creatinine, Ser: 5.23 mg/dL — ABNORMAL HIGH (ref 0.61–1.24)
GFR calc Af Amer: 12 mL/min — ABNORMAL LOW (ref >60)
GFR calc non Af Amer: 10 mL/min — ABNORMAL LOW (ref >60)
Glucose, Bld: 146 mg/dL — ABNORMAL HIGH (ref 70–99)
Potassium: 5.2 mmol/L — ABNORMAL HIGH (ref 3.5–5.1)
Sodium: 135 mmol/L (ref 135–145)
Total Bilirubin: 0.9 mg/dL (ref 0.3–1.2)
Total Protein: 7.8 g/dL (ref 6.5–8.1)

## 2018-05-15 LAB — GLUCOSE, CAPILLARY: Glucose-Capillary: 123 mg/dL — ABNORMAL HIGH (ref 70–99)

## 2018-05-15 LAB — SURGICAL PCR SCREEN
MRSA, PCR: POSITIVE — AB
Staphylococcus aureus: POSITIVE — AB

## 2018-05-15 MED ORDER — POLYETHYLENE GLYCOL 3350 17 G PO PACK: 17.0000 g | PACK | Freq: Every day | ORAL | Status: DC | PRN

## 2018-05-15 MED ORDER — INSULIN ASPART 100 UNIT/ML ~~LOC~~ SOLN: 0.0000 [IU] | Freq: Every day | SUBCUTANEOUS | Status: DC

## 2018-05-15 MED ORDER — OLMESARTAN-AMLODIPINE-HCTZ 40-10-25 MG PO TABS: 1.0000 | ORAL_TABLET | Freq: Every day | ORAL | Status: DC

## 2018-05-15 MED ORDER — MUPIROCIN 2 % EX OINT
1.0000 "application " | TOPICAL_OINTMENT | Freq: Two times a day (BID) | CUTANEOUS | Status: AC
  Administered 2018-05-16 – 2018-05-20 (×9): 1 via NASAL
  Filled 2018-05-15: qty 22

## 2018-05-15 MED ORDER — ONDANSETRON HCL 4 MG/2ML IJ SOLN: 4.0000 mg | Freq: Four times a day (QID) | INTRAMUSCULAR | Status: DC | PRN

## 2018-05-15 MED ORDER — HYDROCHLOROTHIAZIDE 25 MG PO TABS
25.0000 mg | ORAL_TABLET | Freq: Every day | ORAL | Status: DC
  Administered 2018-05-17 – 2018-05-18 (×2): 25 mg via ORAL
  Filled 2018-05-15 (×2): qty 1

## 2018-05-15 MED ORDER — IRBESARTAN 150 MG PO TABS
300.0000 mg | ORAL_TABLET | Freq: Every day | ORAL | Status: DC
  Administered 2018-05-17 – 2018-05-18 (×2): 300 mg via ORAL
  Filled 2018-05-15 (×2): qty 2

## 2018-05-15 MED ORDER — ONDANSETRON HCL 4 MG PO TABS: 4.0000 mg | ORAL_TABLET | Freq: Four times a day (QID) | ORAL | Status: DC | PRN

## 2018-05-15 MED ORDER — ATORVASTATIN CALCIUM 20 MG PO TABS
40.0000 mg | ORAL_TABLET | Freq: Every day | ORAL | Status: DC
  Administered 2018-05-17 – 2018-05-24 (×8): 40 mg via ORAL
  Filled 2018-05-15 (×8): qty 2

## 2018-05-15 MED ORDER — ACETAMINOPHEN 650 MG RE SUPP: 650.0000 mg | Freq: Four times a day (QID) | RECTAL | Status: DC | PRN

## 2018-05-15 MED ORDER — MORPHINE SULFATE (PF) 2 MG/ML IV SOLN
2.0000 mg | INTRAVENOUS | Status: DC | PRN
  Administered 2018-05-17: 2 mg via INTRAVENOUS
  Filled 2018-05-15: qty 1

## 2018-05-15 MED ORDER — AMLODIPINE BESYLATE 10 MG PO TABS
10.0000 mg | ORAL_TABLET | Freq: Every day | ORAL | Status: DC
  Administered 2018-05-17 – 2018-05-18 (×2): 10 mg via ORAL
  Filled 2018-05-15 (×2): qty 1

## 2018-05-15 MED ORDER — ALLOPURINOL 100 MG PO TABS
50.0000 mg | ORAL_TABLET | ORAL | Status: DC
  Administered 2018-05-18 – 2018-05-24 (×4): 50 mg via ORAL
  Filled 2018-05-15 (×5): qty 1

## 2018-05-15 MED ORDER — TAMSULOSIN HCL 0.4 MG PO CAPS
0.4000 mg | ORAL_CAPSULE | Freq: Every day | ORAL | Status: DC
  Administered 2018-05-17 – 2018-05-24 (×8): 0.4 mg via ORAL
  Filled 2018-05-15 (×8): qty 1

## 2018-05-15 MED ORDER — CEFAZOLIN SODIUM-DEXTROSE 2-4 GM/100ML-% IV SOLN
2.0000 g | Freq: Once | INTRAVENOUS | Status: AC
  Administered 2018-05-16: 17:00:00 2 g via INTRAVENOUS
  Filled 2018-05-15: qty 100

## 2018-05-15 MED ORDER — INSULIN ASPART 100 UNIT/ML ~~LOC~~ SOLN
0.0000 [IU] | Freq: Three times a day (TID) | SUBCUTANEOUS | Status: DC
  Administered 2018-05-17: 08:00:00 1 [IU] via SUBCUTANEOUS
  Administered 2018-05-17 (×2): 2 [IU] via SUBCUTANEOUS
  Administered 2018-05-18: 08:00:00 1 [IU] via SUBCUTANEOUS
  Administered 2018-05-18 – 2018-05-19 (×2): 2 [IU] via SUBCUTANEOUS
  Administered 2018-05-19 – 2018-05-20 (×2): 1 [IU] via SUBCUTANEOUS
  Administered 2018-05-20: 2 [IU] via SUBCUTANEOUS
  Administered 2018-05-20: 12:00:00 5 [IU] via SUBCUTANEOUS
  Administered 2018-05-21 (×3): 2 [IU] via SUBCUTANEOUS
  Administered 2018-05-22: 12:00:00 3 [IU] via SUBCUTANEOUS
  Administered 2018-05-22: 09:00:00 2 [IU] via SUBCUTANEOUS
  Administered 2018-05-23 (×2): 1 [IU] via SUBCUTANEOUS
  Administered 2018-05-24 (×2): 2 [IU] via SUBCUTANEOUS
  Administered 2018-05-24: 09:00:00 1 [IU] via SUBCUTANEOUS
  Filled 2018-05-15 (×20): qty 1

## 2018-05-15 MED ORDER — ASPIRIN EC 81 MG PO TBEC
81.0000 mg | DELAYED_RELEASE_TABLET | Freq: Every day | ORAL | Status: DC
  Administered 2018-05-17 – 2018-05-24 (×8): 81 mg via ORAL
  Filled 2018-05-15 (×8): qty 1

## 2018-05-15 MED ORDER — CARVEDILOL 3.125 MG PO TABS
6.2500 mg | ORAL_TABLET | Freq: Two times a day (BID) | ORAL | Status: DC
  Administered 2018-05-17 – 2018-05-18 (×2): 6.25 mg via ORAL
  Filled 2018-05-15 (×3): qty 2

## 2018-05-15 MED ORDER — ACETAMINOPHEN 325 MG PO TABS: 650.0000 mg | ORAL_TABLET | Freq: Four times a day (QID) | ORAL | Status: DC | PRN

## 2018-05-15 MED ORDER — FENTANYL CITRATE (PF) 100 MCG/2ML IJ SOLN
50.0000 ug | INTRAMUSCULAR | Status: DC | PRN
  Administered 2018-05-15: 50 ug via INTRAVENOUS
  Filled 2018-05-15: qty 2

## 2018-05-15 NOTE — Progress Notes (Signed)
Pt MRSA Swab positive per lab. Pdowless, rn 05/15/2018

## 2018-05-15 NOTE — Telephone Encounter (Signed)
Pt wife called to say that yesterday after coming home from dialysis her husband fell.  She was able to get him up and into bed.  She denies visible injury. She denies head injury.  Today she is calling because her husband is unable to get out of bed. She states that he is unable to walk and is complaining of severe pain when she moves his right leg.  She states the pain is in his hip area. Per protocol pt wife will call 911 for transport to the hospital for evaluation of his injury. I spoke to Mr [facility].  He agrees to go to the ER. Care advice read to his wife. She verbalized understanding of all instructions.  Reason for Disposition . Looks like a broken bone or dislocated joint (e.g., crooked or deformed)  Answer Assessment - Initial Assessment Questions 1. LOCATION and RADIATION: "Where is the pain located?"      Rt hip 2. QUALITY: "What does the pain feel like?"  (e.g., sharp, dull, aching, burning)    Only when moving it 3. SEVERITY: "How bad is the pain?" "What does it keep you from doing?"   (Scale 1-10; or mild, moderate, severe)   -  MILD (1-3): doesn't interfere with normal activities    -  MODERATE (4-7): interferes with normal activities (e.g., work or school) or awakens from sleep, limping    -  SEVERE (8-10): excruciating pain, unable to do any normal activities, unable to walk    10 4. ONSET: "When did the pain start?" "Does it come and go, or is it there all the time?"     Yesterday after a fall 5. WORK OR EXERCISE: "Has there been any recent work or exercise that involved this part of the body?"     After fall yesterday 6. CAUSE: "What do you think is causing the hip pain?"     fall 7. AGGRAVATING FACTORS: "What makes the hip pain worse?" (e.g., walking, climbing stairs, running)   movement 8. OTHER SYMPTOMS: "Do you have any other symptoms?" (e.g., back pain, pain shooting down leg,  fever, rash)     no  Protocols used: HIP PAIN-A-AH

## 2018-05-15 NOTE — ED Triage Notes (Signed)
Pt arrived via [facility] from home with a fall @ 1630. Pt missed the chair and fell straight back. No LOC, head, neck, or back pain. C/O of right hip pain. Dialysis shunt in right arm. Vitals WNL.

## 2018-05-15 NOTE — ED Notes (Addendum)
ED TO INPATIENT HANDOFF REPORT  ED Nurse Name and Phone #: Valeria Batman  S Name/Age/Gender [facility] 73 y.o. male Room/Bed: ED03A/ED03A  Code Status   Code Status: Prior  Home/SNF/Other Home Patient oriented to: self, place, time and situation Is this baseline? Yes   Triage Complete: Triage complete  Chief Complaint Fall/Rt hip pain  Triage Note Pt arrived via [facility] from home with a fall @ 1630. Pt missed the chair and fell straight back. No LOC, head, neck, or back pain. C/O of right hip pain. Dialysis shunt in right arm. Vitals WNL.   Allergies No Known Allergies  Level of Care/Admitting Diagnosis ED Disposition    ED Disposition Condition Comment   Admit  The patient appears reasonably stabilized for admission considering the current resources, flow, and capabilities available in the ED at this time, and I doubt any other [facility] requiring further screening and/or treatment in the ED prior to admission is  present.       B Medical/Surgery History Past Medical History:  Diagnosis Date  . Acute metabolic encephalopathy 54/27/0623   Per my chart  . Arthritis   . Chronic airway obstruction, not elsewhere classified    worked in [employer] and was exposed to [employer]  . 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 Holley Raring, nephrologist.  on dialysis tu - thur - sat  . Depressive disorder, not elsewhere classified   . Dialysis patient [facility]) 01/2018   dialysis tuesday - thursday - saturday  . Gout   . Gout   . Heart murmur    followed by dr. Clayborn Bigness  . Hyperlipidemia   . Hypertension   . Hypertrophy of prostate without urinary obstruction and other lower urinary tract symptoms (LUTS)   . Impotence of organic origin   . Microalbuminuria   . 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 Obesity, unspecified   . Plantar wart   . Sleep apnea    years ago but never used a cpap  . Synovitis and tenosynovitis, unspecified   . Type II or  unspecified type diabetes mellitus without mention of complication, uncontrolled   . Unspecified venous (peripheral) insufficiency   . UTI (urinary tract infection)    frequent with ESBL   Past Surgical History:  Procedure Laterality Date  . AV FISTULA PLACEMENT Right 03/08/2018   Procedure: ARTERIOVENOUS (AV) FISTULA CREATION;  Surgeon: Katha Cabal, MD;  Location: [facility] ORS;  Service: Vascular;  Laterality: Right;  . DIALYSIS/PERMA CATHETER INSERTION N/A 09/28/2017   Procedure: DIALYSIS/PERMA CATHETER INSERTION;  Surgeon: Katha Cabal, MD;  Location: [place] [facility];  Service: Cardiovascular;  Laterality: N/A;  . DIALYSIS/PERMA CATHETER INSERTION     peritoneal catheter  . foot surgery Right    repair of calus  . REMOVAL OF A DIALYSIS CATHETER N/A 03/08/2018   Procedure: REMOVAL OF A DIALYSIS CATHETER;  Surgeon: Katha Cabal, MD;  Location: [facility] ORS;  Service: Vascular;  Laterality: N/A;     A IV Location/Drains/Wounds Patient Lines/Drains/Airways Status   Active Line/Drains/Airways    Name:   Placement date:   Placement time:   Site:   Days:   Peripheral IV 05/15/18 Left Hand   05/15/18    1703    Hand   less than 1   Fistula / Graft Right Forearm Arteriovenous fistula   03/08/18    0841    Forearm   68   Hemodialysis Catheter Right Subclavian Double-lumen;Permanent   -    -  Subclavian      External Urinary Catheter   11/06/17    1755    -   190   Incision (Closed) 03/08/18 Arm Right   03/08/18    0843     68   Incision (Closed) 03/08/18 Abdomen Other (Comment)   03/08/18    0843     68          Intake/Output Last 24 hours No intake or output data in the 24 hours ending 05/15/18 1817  Labs/Imaging Results for orders placed or performed during the hospital encounter of 05/15/18 (from the past 48 hour(s))  CBC with Differential/Platelet     Status: Abnormal   Collection Time: 05/15/18  5:05 PM  Result Value Ref Range   WBC 7.6 4.0 - 10.5 K/uL   RBC  4.16 (L) 4.22 - 5.81 MIL/uL   Hemoglobin 11.8 (L) 13.0 - 17.0 g/dL   HCT 35.9 (L) 39.0 - 52.0 %   MCV 86.3 80.0 - 100.0 fL   MCH 28.4 26.0 - 34.0 pg   MCHC 32.9 30.0 - 36.0 g/dL   RDW 15.1 11.5 - 15.5 %   Platelets 209 150 - 400 K/uL   nRBC 0.0 0.0 - 0.2 %   Neutrophils Relative % 67 %   Neutro Abs 5.2 1.7 - 7.7 K/uL   Lymphocytes Relative 20 %   Lymphs Abs 1.5 0.7 - 4.0 K/uL   Monocytes Relative 7 %   Monocytes Absolute 0.5 0.1 - 1.0 K/uL   Eosinophils Relative 5 %   Eosinophils Absolute 0.4 0.0 - 0.5 K/uL   Basophils Relative 1 %   Basophils Absolute 0.0 0.0 - 0.1 K/uL   Immature Granulocytes 0 %   Abs Immature Granulocytes 0.02 0.00 - 0.07 K/uL    Comment: Performed at [facility], [address] [place], [place] [address]  Comprehensive metabolic panel     Status: Abnormal   Collection Time: 05/15/18  5:05 PM  Result Value Ref Range   Sodium 135 135 - 145 mmol/L   Potassium 5.2 (H) 3.5 - 5.1 mmol/L    Comment: HEMOLYSIS AT THIS LEVEL MAY AFFECT RESULT   Chloride 96 (L) 98 - 111 mmol/L   CO2 25 22 - 32 mmol/L   Glucose, Bld 146 (H) 70 - 99 mg/dL   BUN 41 (H) 8 - 23 mg/dL   Creatinine, Ser 5.23 (H) 0.61 - 1.24 mg/dL   Calcium 8.4 (L) 8.9 - 10.3 mg/dL   Total Protein 7.8 6.5 - 8.1 g/dL   Albumin 3.2 (L) 3.5 - 5.0 g/dL   AST 19 15 - 41 U/L    Comment: HEMOLYSIS AT THIS LEVEL MAY AFFECT RESULT   ALT 13 0 - 44 U/L    Comment: HEMOLYSIS AT THIS LEVEL MAY AFFECT RESULT   Alkaline Phosphatase 87 38 - 126 U/L   Total Bilirubin 0.9 0.3 - 1.2 mg/dL    Comment: HEMOLYSIS AT THIS LEVEL MAY AFFECT RESULT   GFR calc non Af Amer 10 (L) >60 mL/min   GFR calc Af Amer 12 (L) >60 mL/min   Anion gap 14 5 - 15    Comment: Performed at [facility], [address] [place], [place] [address]   Dg Chest Portable 1 View  Result Date: 05/15/2018 CLINICAL DATA:  Right hip pain. EXAM: PORTABLE CHEST 1 VIEW COMPARISON:  January 31, 2018 FINDINGS: The dialysis catheter is  been removed. No pneumothorax. The heart size borderline. The hila  and mediastinum are unremarkable. Increased interstitial prominence. IMPRESSION: Increased interstitial markings in the lungs consistent with pulmonary venous congestion/mild edema. Interval removal of right central line. No other abnormalities. Electronically Signed   By: Dorise Bullion III M.D   On: 05/15/2018 17:13   Dg Hip Unilat W Or Wo Pelvis 2-3 Views Right  Result Date: 05/15/2018 CLINICAL DATA:  Right hip pain status post fall EXAM: DG HIP (WITH OR WITHOUT PELVIS) 2-3V RIGHT COMPARISON:  None. FINDINGS: Acute nondisplaced right subcapital femoral neck fracture. No other fracture or dislocation. Generalized osteopenia. IMPRESSION: 1. Acute nondisplaced right subcapital femoral neck fracture. Electronically Signed   By: Kathreen Devoid   On: 05/15/2018 17:11    Pending Labs Unresulted Labs (From admission, onward)   None      Vitals/Pain Today's Vitals   05/15/18 1602 05/15/18 1630 05/15/18 1730 05/15/18 1800  BP:  131/64 (!) 145/71 128/77  Pulse:  94 97 97  Resp:   18 19  Temp:      TempSrc:      SpO2: 90% 93% 94%   Weight:      Height:      PainSc:        Isolation Precautions No active isolations  Medications Medications  fentaNYL (SUBLIMAZE) injection 50 mcg (50 mcg Intravenous Given 05/15/18 1703)    Mobility walks with device Low fall risk   Focused Assessments    R Recommendations: See Admitting Provider Note  Report given to: Larena Glassman, RN

## 2018-05-15 NOTE — ED Notes (Signed)
Called placed to [facility] (wife) to update on pt's care.   [phone]-home [phone]

## 2018-05-15 NOTE — H&P (Signed)
[employer] [facility] [place] NAME: Joseph Newman    MR#:  [id number]  DATE OF BIRTH:  Jun 09, 1945  DATE OF ADMISSION:  05/15/2018  PRIMARY CARE PHYSICIAN: Steele Sizer, MD   REQUESTING/REFERRING PHYSICIAN: Merlyn Lot, MD  CHIEF COMPLAINT:   Chief Complaint  Patient presents with  . Fall    HISTORY OF PRESENT ILLNESS:  Joseph Newman  is a 73 y.o. male with a known history of ESRD on HD, hypertension, hyperlipidemia, type 2 diabetes, depression who presented to the ED with right hip pain after a mechanical fall at home.  He states he was trying to sit down on a chair, when he completely missed the chair and landed on his bottom and right hip.  He denies any head trauma or loss of consciousness.  He states he does not have any hip pain when he lays completely still, but he has pretty significant pain whenever he tries to move his legs.  He denies any numbness or tingling of his lower extremities.  He denies any chest pain or shortness of breath.  In the ED, he had desaturations to 88% on room air and was placed on 2 L O2.  Labs are significant for K 5.2.  Chest x-ray with pulmonary venous congestion and mild edema.  Right hip x-ray with acute nondisplaced right subcapital femoral neck fracture.  Hospitalists were called for admission.  PAST MEDICAL HISTORY:   Past Medical History:  Diagnosis Date  . Acute metabolic encephalopathy 99/83/3825   Per my chart  . Arthritis   . Chronic airway obstruction, not elsewhere classified    worked in [employer] and was exposed to [employer]  . 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 Holley Raring, nephrologist.  on dialysis tu - thur - sat  . Depressive disorder, not elsewhere classified   . Dialysis patient [facility]) 01/2018   dialysis tuesday - thursday - saturday  . Gout   . Gout   . Heart murmur    followed by dr. Clayborn Bigness  . Hyperlipidemia   . Hypertension   . Hypertrophy of  prostate without urinary obstruction and other lower urinary tract symptoms (LUTS)   . Impotence of organic origin   . Microalbuminuria   . 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 Obesity, unspecified   . Plantar wart   . Sleep apnea    years ago but never used a cpap  . Synovitis and tenosynovitis, unspecified   . Type II or unspecified type diabetes mellitus without mention of complication, uncontrolled   . Unspecified venous (peripheral) insufficiency   . UTI (urinary tract infection)    frequent with ESBL    PAST SURGICAL HISTORY:   Past Surgical History:  Procedure Laterality Date  . AV FISTULA PLACEMENT Right 03/08/2018   Procedure: ARTERIOVENOUS (AV) FISTULA CREATION;  Surgeon: Katha Cabal, MD;  Location: [facility] ORS;  Service: Vascular;  Laterality: Right;  . DIALYSIS/PERMA CATHETER INSERTION N/A 09/28/2017   Procedure: DIALYSIS/PERMA CATHETER INSERTION;  Surgeon: Katha Cabal, MD;  Location: [place] [facility];  Service: Cardiovascular;  Laterality: N/A;  . DIALYSIS/PERMA CATHETER INSERTION     peritoneal catheter  . foot surgery Right    repair of calus  . REMOVAL OF A DIALYSIS CATHETER N/A 03/08/2018   Procedure: REMOVAL OF A DIALYSIS CATHETER;  Surgeon: Katha Cabal, MD;  Location: [facility] ORS;  Service: Vascular;  Laterality: N/A;  SOCIAL HISTORY:   Social History   Tobacco Use  . Smoking status: Former Smoker    Packs/day: 0.25    Years: 30.00    Pack years: 7.50    Types: Cigarettes, Cigars    Last attempt to quit: 07/23/2017    Years since quitting: 0.8  . Smokeless tobacco: Never Used  Substance Use Topics  . Alcohol use: Yes    Alcohol/week: 0.0 standard drinks    Comment: occasional    FAMILY HISTORY:   Family History  Problem Relation Age of Onset  . Hypertension Mother   . Kidney disease Neg Hx   . Prostate cancer Neg Hx     DRUG ALLERGIES:  No Known Allergies  REVIEW OF SYSTEMS:   Review of Systems   Constitutional: Negative for chills and fever.  HENT: Negative for congestion and sore throat.   Eyes: Negative for blurred vision and double vision.  Respiratory: Negative for cough and shortness of breath.   Cardiovascular: Negative for chest pain and palpitations.  Gastrointestinal: Negative for nausea and vomiting.  Genitourinary: Negative for dysuria and urgency.  Musculoskeletal: Positive for joint pain. Negative for back pain and neck pain.  Neurological: Negative for dizziness, tingling, sensory change and headaches.  Psychiatric/Behavioral: Negative for depression. The patient is not nervous/anxious.     MEDICATIONS AT HOME:   Prior to Admission medications   Medication Sig Start Date End Date Taking? Authorizing Provider  allopurinol (ZYLOPRIM) 100 MG tablet Take 0.5 tablets (50 mg total) by mouth every other day. 04/26/18 07/25/18 Yes Sowles, Drue Stager, MD  aspirin EC 81 MG tablet Take 81 mg by mouth daily.   Yes [provider]  atorvastatin (LIPITOR) 40 MG tablet Take 1 tablet (40 mg total) by mouth daily. 01/11/18  Yes Sowles, Drue Stager, MD  calcitRIOL (ROCALTROL) 0.25 MCG capsule Take 0.25 mcg by mouth daily.  04/02/17  Yes [provider]  carvedilol (COREG) 6.25 MG tablet Take 6.25 mg by mouth 2 (two) times daily with a meal.  04/05/15  Yes Lateef, Munsoor, MD  cholecalciferol (VITAMIN D3) 25 MCG (1000 UT) tablet Take 2 capsules by mouth daily.    Yes [provider]  Cyanocobalamin (VITAMIN B-12) 1000 MCG SUBL Take 1 tablet by mouth daily.   Yes [provider]  docusate sodium (COLACE) 100 MG capsule Take 100 mg by mouth daily.   Yes [provider]  Multiple Vitamin (MULTIVITAMINS PO) Take 1 tablet by mouth daily.   Yes [provider]  Olmesartan-amLODIPine-HCTZ 40-10-25 MG TABS Take 1 tablet by mouth daily. 01/11/18  Yes Sowles, Drue Stager, MD  tamsulosin (FLOMAX) 0.4 MG CAPS capsule TAKE 1 CAPSULE (0.4 MG TOTAL) BY MOUTH DAILY  AFTER BREAKFAST. 04/09/18  Yes Sowles, Drue Stager, MD  TRULICITY 1.5 XB/1.4NW SOPN Inject 1.5 mg into the skin See admin instructions. Injects on Sundays 11/21/17  Yes [provider]      VITAL SIGNS:  Blood pressure 126/83, pulse 94, temperature 98 F (36.7 C), temperature source Oral, resp. rate 18, height 6\' 5"  (1.956 m), weight 90.7 kg, SpO2 (!) 88 %.  PHYSICAL EXAMINATION:  Physical Exam  GENERAL:  73 y.o.-year-old patient lying in the bed with no acute distress.  EYES: Pupils equal, round, reactive to light and accommodation. No scleral icterus. Extraocular muscles intact.  HEENT: Head atraumatic, normocephalic. Oropharynx and nasopharynx clear.  NECK:  Supple, no jugular venous distention. No thyroid enlargement, no tenderness.  LUNGS: Normal breath sounds bilaterally, no wheezing, rales,rhonchi or crepitation.  No use of accessory muscles of respiration.  CARDIOVASCULAR: Mildly tachycardic, regular rhythm, S1, S2 normal. No murmurs, rubs, or gallops.  ABDOMEN: Soft, nontender, nondistended. Bowel sounds present. No organomegaly or mass.  EXTREMITIES: No pedal edema, cyanosis, or clubbing. + Right lower extremity is internally rotated NEUROLOGIC: Cranial nerves II through XII are intact.  Unable to assess muscle strength in the right lower extremity.  Moves all other extremities.  Sensation intact. Gait not checked.  PSYCHIATRIC: The patient is alert and oriented x 3.  SKIN: No obvious rash, lesion, or ulcer.   LABORATORY PANEL:   CBC Recent Labs  Lab 05/15/18 1705  WBC 7.6  HGB 11.8*  HCT 35.9*  PLT 209   ------------------------------------------------------------------------------------------------------------------  Chemistries  Recent Labs  Lab 05/15/18 1705  NA 135  K 5.2*  CL 96*  CO2 25  GLUCOSE 146*  BUN 41*  CREATININE 5.23*  CALCIUM 8.4*  AST 19  ALT 13  ALKPHOS 87  BILITOT 0.9    ------------------------------------------------------------------------------------------------------------------  Cardiac Enzymes No results for input(s): TROPONINI in the last 168 hours. ------------------------------------------------------------------------------------------------------------------  RADIOLOGY:  Dg Chest Portable 1 View  Result Date: 05/15/2018 CLINICAL DATA:  Right hip pain. EXAM: PORTABLE CHEST 1 VIEW COMPARISON:  January 31, 2018 FINDINGS: The dialysis catheter is been removed. No pneumothorax. The heart size borderline. The hila and mediastinum are unremarkable. Increased interstitial prominence. IMPRESSION: Increased interstitial markings in the lungs consistent with pulmonary venous congestion/mild edema. Interval removal of right central line. No other abnormalities. Electronically Signed   By: Dorise Bullion III M.D   On: 05/15/2018 17:13   Dg Hip Unilat W Or Wo Pelvis 2-3 Views Right  Result Date: 05/15/2018 CLINICAL DATA:  Right hip pain status post fall EXAM: DG HIP (WITH OR WITHOUT PELVIS) 2-3V RIGHT COMPARISON:  None. FINDINGS: Acute nondisplaced right subcapital femoral neck fracture. No other fracture or dislocation. Generalized osteopenia. IMPRESSION: 1. Acute nondisplaced right subcapital femoral neck fracture. Electronically Signed   By: Kathreen Devoid   On: 05/15/2018 17:11      IMPRESSION AND PLAN:   Right hip fracture-s/p mechanical fall -Ortho consult-plan for surgical repair tomorrow -Pain control -We will make patient n.p.o. at midnight  ESRD on HD TTS- chest x-ray with mild pulmonary edema.  Patient on 2 L O2. -Nephrology consult -Plan for HD tomorrow morning prior to surgery  Hypertension-blood pressures well controlled -Continue home Coreg, olmesartan, amlodipine, HCTZ  Type 2 diabetes-blood sugars mildly elevated in the ED -SSI  Hyperlipidemia-stable -Continue home statin  BPH-stable -Continue home Flomax  DVT prophylaxis- SCDs.   Start pharmacologic DVT prophylaxis after surgery.  All the records are reviewed and case discussed with ED provider. Management plans discussed with the patient, family and they are in agreement.  CODE STATUS: FULL  TOTAL TIME TAKING CARE OF THIS PATIENT: 45 minutes.    Berna Spare Mayo M.D on 05/15/2018 at 7:43 PM  Between 7am to 6pm - Pager - [phone]  After 6pm go to [email or web address] - Technical brewer [place] [facility]  Office  [phone]  CC: Primary care physician; Steele Sizer, MD   Note: This dictation was prepared with Dragon dictation along with smaller phrase technology. Any transcriptional errors that result from this process are unintentional.

## 2018-05-15 NOTE — Progress Notes (Signed)
Family Meeting Note  Advance Directive:no  Today a meeting took place with the Patient.  Patient is uable to participate.  The following clinical team members were present during this meeting:MD  The following were discussed:Patient's diagnosis: right hip fracture, Patient's progosis: Unable to determine and Goals for treatment: Full Code  Additional follow-up to be provided: prn  Time spent during discussion:20 minutes  Evette Doffing, MD

## 2018-05-15 NOTE — ED Provider Notes (Signed)
[facility] Emergency Department Provider Note    First MD Initiated Contact with Patient 05/15/18 1622     (approximate)  I have reviewed the triage vital signs and the nursing notes.   HISTORY  Chief Complaint Fall    HPI Joseph Newman is a 73 y.o. male extensive past medical history as listed below also on dialysis receiving dialysis yesterday presents to the ER after mechanical fall.  States he was sitting down in his chair missed it landing on his right hip.  Denies hitting his head or neck.  Denies any headache but is complaining of moderate to severe right hip pain whenever he moves.  Denies any numbness or tingling.  Does not have any shortness of breath symptomatically denies any cough but patient noted to be hypoxic on arrival.    Past Medical History:  Diagnosis Date   Acute metabolic encephalopathy 68/61/6837   Per my chart   Arthritis    Chronic airway obstruction, not elsewhere classified    worked in [employer] and was exposed to many chemicals   CKD (chronic kidney disease) stage 2, GFR 60-89 ml/min 05/03/2017   Dr. Holley Raring, nephrologist.  on dialysis tu - thur - sat   Depressive disorder, not elsewhere classified    Dialysis patient [facility]) 01/2018   dialysis tuesday - thursday - saturday   Gout    Gout    Heart murmur    followed by dr. Clayborn Bigness   Hyperlipidemia    Hypertension    Hypertrophy of prostate without urinary obstruction and other lower urinary tract symptoms (LUTS)    Impotence of organic origin    Microalbuminuria    Nodular prostate without urinary obstruction    Obesity, unspecified    Plantar wart    Sleep apnea    years ago but never used a cpap   Synovitis and tenosynovitis, unspecified    Type II or unspecified type diabetes mellitus without mention of complication, uncontrolled    Unspecified venous (peripheral) insufficiency    UTI (urinary tract infection)    frequent with ESBL   Family  History  Problem Relation Age of Onset   Hypertension Mother    Kidney disease Neg Hx    Prostate cancer Neg Hx    Past Surgical History:  Procedure Laterality Date   AV FISTULA PLACEMENT Right 03/08/2018   Procedure: ARTERIOVENOUS (AV) FISTULA CREATION;  Surgeon: Katha Cabal, MD;  Location: [facility] ORS;  Service: Vascular;  Laterality: Right;   DIALYSIS/PERMA CATHETER INSERTION N/A 09/28/2017   Procedure: DIALYSIS/PERMA CATHETER INSERTION;  Surgeon: Katha Cabal, MD;  Location: [facility];  Service: Cardiovascular;  Laterality: N/A;   DIALYSIS/PERMA CATHETER INSERTION     peritoneal catheter   foot surgery Right    repair of calus   REMOVAL OF A DIALYSIS CATHETER N/A 03/08/2018   Procedure: REMOVAL OF A DIALYSIS CATHETER;  Surgeon: Katha Cabal, MD;  Location: [facility] ORS;  Service: Vascular;  Laterality: N/A;   Patient Active Problem List   Diagnosis Date Noted   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 ([place]) 01/30/2018   End stage renal disease ([place]) 12/31/2017   Chronic gouty arthritis 10/22/2017   Hyperlipidemia associated with type 2 diabetes mellitus ([place]) 09/06/2017   Chronic kidney disease (CKD), stage V ([place]) 07/26/2017   Dyslipidemia associated with type 2 diabetes mellitus ([place]) 03/13/2016   Diabetic ulcer of toe of right foot associated with type 2 diabetes mellitus, limited to breakdown of skin ([place]) 03/07/2016  Type 2 diabetes mellitus with diabetic nephropathy, without long-term current use of insulin ([place]) 06/17/2015   Microscopic hematuria 05/30/2015   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 Authorizing Provider  allopurinol (ZYLOPRIM) 100 MG tablet Take 0.5 tablets (50 mg total) by mouth every other day. 04/26/18 07/25/18  Steele Sizer, MD  aspirin EC 81 MG tablet Take 81 mg by mouth daily.    [provider]  atorvastatin (LIPITOR) 40 MG tablet Take 1 tablet (40 mg total) by mouth daily. 01/11/18   Steele Sizer, MD  Blood Glucose Monitoring Suppl (ONE TOUCH ULTRA SYSTEM KIT) W/DEVICE KIT 1 kit by Does not apply route once.    [provider]  calcitRIOL (ROCALTROL) 0.25 MCG capsule Take 0.25 mcg by mouth daily.  04/02/17   [provider]  carvedilol (COREG) 6.25 MG tablet Take 6.25 mg by mouth 2 (two) times daily with a meal.  04/05/15   Holley Raring, [place], MD  cholecalciferol (VITAMIN D3) 25 MCG (1000 UT) tablet Take 2 capsules by mouth daily.     [provider]  ciprofloxacin (CIPRO) 500 MG tablet Take 1 tablet (500 mg total) by mouth every 12 (twelve) hours. Take the day prior to cysto appointment Patient not taking: Reported on 04/01/2018 03/06/18   Abbie Sons, MD  Cyanocobalamin (VITAMIN B-12) 1000 MCG SUBL Take 1 tablet by mouth daily.    [provider]  docusate sodium (COLACE) 100 MG capsule Take 100 mg by mouth daily.    [provider]  HYDROcodone-acetaminophen (NORCO) 5-325 MG tablet Take 1-2 tablets by mouth every 6 (six) hours as needed. Patient not taking: Reported on 04/01/2018 03/08/18   Schnier, Dolores Lory, MD  Multiple Vitamin (MULTIVITAMINS PO) Take 1 tablet by mouth daily.    [provider]  Nutritional Supplements (FEEDING SUPPLEMENT, NEPRO CARB STEADY,) LIQD Take 237 mLs by mouth 3 (three) times daily between meals. 11/07/17   Loletha Grayer, MD  Olmesartan-amLODIPine-HCTZ 40-10-25 MG TABS Take 1 tablet by mouth daily. 01/11/18   Steele Sizer, MD  ondansetron (ZOFRAN ODT) 4 MG disintegrating tablet Take 1 tablet (4 mg total) by mouth every 8 (eight) hours as needed for nausea or  vomiting. Patient not taking: Reported on 04/01/2018 12/30/17   Harvest Dark, MD  tamsulosin (FLOMAX) 0.4 MG CAPS capsule TAKE 1 CAPSULE (0.4 MG TOTAL) BY MOUTH DAILY AFTER BREAKFAST. 04/09/18   Steele Sizer, MD  TRULICITY 1.5 QJ/3.3LK SOPN Inject 1.5 mg into the skin See admin instructions. Injects on Sundays 11/21/17   [provider]    Allergies Patient has no known allergies.    Social History Social History   Tobacco Use   Smoking status: Former Smoker    Packs/day: 0.25    Years: 30.00    Pack years: 7.50    Types: Cigarettes, Cigars    Last attempt to quit: 07/23/2017    Years since quitting: 0.8   Smokeless tobacco: Never Used  Substance Use Topics   Alcohol use: Yes    Alcohol/week: 0.0 standard drinks    Comment: occasional   Drug use: No    Review of Systems Patient denies headaches, rhinorrhea, blurry  vision, numbness, shortness of breath, chest pain, edema, cough, abdominal pain, nausea, vomiting, diarrhea, dysuria, fevers, rashes or hallucinations unless otherwise stated above in HPI. ____________________________________________   PHYSICAL EXAM:  VITAL SIGNS: Vitals:   05/15/18 1601 05/15/18 1602  BP: 132/62   Pulse: 95   Resp: 20   Temp: 98 F (36.7 C)   SpO2:  90%    Constitutional: Alert and oriented. Chronically ill appearing,  Normal respirations on 2L [place]  Eyes: Conjunctivae are normal.  Head: Atraumatic. Nose: No congestion/rhinnorhea. Mouth/Throat: Mucous membranes are moist.   Neck: No stridor. Painless ROM.  Cardiovascular: Normal rate, regular rhythm. Grossly normal heart sounds.  Good peripheral circulation. Respiratory: Normal respiratory effort.  No retractions. Lungs with coarse wheeze throughout,. Gastrointestinal: Soft and nontender. No distention. No abdominal bruits. No CVA tenderness. Genitourinary: deferred Musculoskeletal: TTP with log roll of right side. NV intact distally  No joint effusions. Neurologic:   Normal speech and language. No gross focal neurologic deficits are appreciated. No facial droop Skin:  Skin is warm, dry and intact. No rash noted. Psychiatric: Mood and affect are normal. Speech and behavior are normal.  ____________________________________________   LABS (all labs ordered are listed, but only abnormal results are displayed)  No results found for this or any previous visit (from the past 24 hour(s)). ____________________________________________ ____________________________________________  [id number]  I personally reviewed all radiographic images ordered to evaluate for the above acute complaints and reviewed radiology reports and findings.  These findings were personally discussed with the patient.  Please see medical record for radiology report.  ____________________________________________   PROCEDURES  Procedure(s) performed:  Procedures    Critical Care performed: no ____________________________________________   INITIAL IMPRESSION / ASSESSMENT AND PLAN / ED COURSE  Pertinent labs & imaging results that were available during my care of the patient were reviewed by me and considered in my medical decision making (see chart for details).   DDX: fractre, contusion, dislocation, chf, pna, copd, atalectasis  Joseph Newman is a 73 y.o. who presents to the ED with right hip after mechanical fall as described above.  Blood work and radiographs of be ordered for the by differential.  The patient will be placed on continuous pulse oximetry and telemetry for monitoring.  Laboratory evaluation will be sent to evaluate for the above complaints.  He hypoxic concerning for edema.  He is stable on 2 L nasal cannula.  Clinical Course as of May 14 1933  Wed May 15, 2018  1745 Patient does have evidence of nondisplaced subcapital right hip fracture.  Potassium borderline elevated at 5.2.  Patient does have mild hypoxia requiring supplemental oxygen.  Discussed case with Dr.  Candiss Norse of nephrology as well as Dr. Rudene Christians of orthopedics.  Tentative plan is dialysis first thing tomorrow morning with OR hip repair tomorrow.  Discussed case with hospitalist who kindly agrees to admit patient for further evaluation and management.   [PR]    Clinical Course User Index [PR] Merlyn Lot, MD    The patient was evaluated in Emergency Department today for the symptoms described in the history of present illness. He/she was evaluated in the context of the global COVID-19 pandemic, which necessitated consideration that the patient might be at risk for infection with the SARS-CoV-2 virus that causes COVID-19. Institutional protocols and algorithms that pertain to the evaluation of patients at risk for COVID-19 are in a state of rapid change based on information released by regulatory bodies including the CDC and federal and state organizations.  These policies and algorithms were followed during the patient's care in the ED.  As part of my medical decision making, I reviewed the following data within the [place] notes reviewed and incorporated, Labs reviewed, notes from prior ED visits and  Controlled Substance Database   ____________________________________________   FINAL CLINICAL IMPRESSION(S) / ED DIAGNOSES  Final diagnoses:  Acute respiratory failure with hypoxia (HCC)  ESRD (end stage renal disease) on dialysis ([place])  Closed fracture of right hip, initial encounter ([place])      NEW MEDICATIONS STARTED DURING THIS VISIT:  New Prescriptions   No medications on file     Note:  This document was prepared using Dragon voice recognition software and may include unintentional dictation errors.    Merlyn Lot, MD 05/15/18 [phone]

## 2018-05-16 ENCOUNTER — Encounter: Payer: Self-pay | Admitting: Anesthesiology

## 2018-05-16 ENCOUNTER — Encounter: Payer: Self-pay | Admitting: Family Medicine

## 2018-05-16 ENCOUNTER — Inpatient Hospital Stay: Payer: Medicare Other

## 2018-05-16 ENCOUNTER — Encounter
Admission: EM | Disposition: A | Discharge status: Skilled Nursing Facility | Payer: Self-pay | Source: Home / Self Care | Attending: Internal Medicine

## 2018-05-16 ENCOUNTER — Inpatient Hospital Stay: Payer: Medicare Other | Admitting: Anesthesiology

## 2018-05-16 HISTORY — PX: HIP PINNING,CANNULATED: SHX1758

## 2018-05-16 LAB — BASIC METABOLIC PANEL
Anion gap: 13 (ref 5–15)
BUN: 44 mg/dL — ABNORMAL HIGH (ref 8–23)
CO2: 23 mmol/L (ref 22–32)
Calcium: 8.5 mg/dL — ABNORMAL LOW (ref 8.9–10.3)
Chloride: 100 mmol/L (ref 98–111)
Creatinine, Ser: 5.62 mg/dL — ABNORMAL HIGH (ref 0.61–1.24)
GFR calc Af Amer: 11 mL/min — ABNORMAL LOW (ref >60)
GFR calc non Af Amer: 9 mL/min — ABNORMAL LOW (ref >60)
Glucose, Bld: 145 mg/dL — ABNORMAL HIGH (ref 70–99)
Potassium: 4.6 mmol/L (ref 3.5–5.1)
Sodium: 136 mmol/L (ref 135–145)

## 2018-05-16 LAB — CBC
HCT: 35.2 % — ABNORMAL LOW (ref 39.0–52.0)
Hemoglobin: 11.5 g/dL — ABNORMAL LOW (ref 13.0–17.0)
MCH: 28.4 pg (ref 26.0–34.0)
MCHC: 32.7 g/dL (ref 30.0–36.0)
MCV: 86.9 fL (ref 80.0–100.0)
Platelets: 209 10*3/uL (ref 150–400)
RBC: 4.05 MIL/uL — ABNORMAL LOW (ref 4.22–5.81)
RDW: 15 % (ref 11.5–15.5)
WBC: 7.7 10*3/uL (ref 4.0–10.5)
nRBC: 0 % (ref 0.0–0.2)

## 2018-05-16 LAB — POCT I-STAT 4, (NA,K, GLUC, HGB,HCT)
Glucose, Bld: 123 mg/dL — ABNORMAL HIGH (ref 70–99)
HCT: 32 % — ABNORMAL LOW (ref 39.0–52.0)
Hemoglobin: 10.9 g/dL — ABNORMAL LOW (ref 13.0–17.0)
Potassium: 4.1 mmol/L (ref 3.5–5.1)
Sodium: 138 mmol/L (ref 135–145)

## 2018-05-16 LAB — GLUCOSE, CAPILLARY
Glucose-Capillary: 130 mg/dL — ABNORMAL HIGH (ref 70–99)
Glucose-Capillary: 120 mg/dL — ABNORMAL HIGH (ref 70–99)
Glucose-Capillary: 109 mg/dL — ABNORMAL HIGH (ref 70–99)
Glucose-Capillary: 131 mg/dL — ABNORMAL HIGH (ref 70–99)

## 2018-05-16 SURGERY — FIXATION, FEMUR, NECK, PERCUTANEOUS, USING SCREW
Anesthesia: Spinal | Site: Hip | Laterality: Right

## 2018-05-16 MED ORDER — BUPIVACAINE HCL (PF) 0.5 % IJ SOLN
INTRAMUSCULAR | Status: DC | PRN
  Administered 2018-05-16: 3 mL

## 2018-05-16 MED ORDER — ONDANSETRON HCL 4 MG PO TABS: 4.0000 mg | ORAL_TABLET | Freq: Four times a day (QID) | ORAL | Status: DC | PRN

## 2018-05-16 MED ORDER — MENTHOL 3 MG MT LOZG
1.0000 | LOZENGE | OROMUCOSAL | Status: DC | PRN
  Filled 2018-05-16: qty 9

## 2018-05-16 MED ORDER — CEFAZOLIN SODIUM-DEXTROSE 1-4 GM/50ML-% IV SOLN
1.0000 g | Freq: Two times a day (BID) | INTRAVENOUS | Status: AC
  Administered 2018-05-17 (×2): 1 g via INTRAVENOUS
  Filled 2018-05-16 (×3): qty 50

## 2018-05-16 MED ORDER — SODIUM CHLORIDE 0.9 % IV SOLN
INTRAVENOUS | Status: DC | PRN
  Administered 2018-05-16: 50 mL

## 2018-05-16 MED ORDER — PROPOFOL 500 MG/50ML IV EMUL
INTRAVENOUS | Status: AC
  Filled 2018-05-16: qty 50

## 2018-05-16 MED ORDER — DOCUSATE SODIUM 100 MG PO CAPS
100.0000 mg | ORAL_CAPSULE | Freq: Two times a day (BID) | ORAL | Status: DC
  Administered 2018-05-16 – 2018-05-24 (×13): 100 mg via ORAL
  Filled 2018-05-16 (×15): qty 1

## 2018-05-16 MED ORDER — MIDAZOLAM HCL 2 MG/2ML IJ SOLN
INTRAMUSCULAR | Status: AC
  Filled 2018-05-16: qty 2

## 2018-05-16 MED ORDER — MAGNESIUM HYDROXIDE 400 MG/5ML PO SUSP
30.0000 mL | Freq: Every day | ORAL | Status: DC | PRN
  Filled 2018-05-16: qty 30

## 2018-05-16 MED ORDER — ENOXAPARIN SODIUM 30 MG/0.3ML ~~LOC~~ SOLN
30.0000 mg | SUBCUTANEOUS | Status: DC
  Administered 2018-05-17 – 2018-05-18 (×2): 30 mg via SUBCUTANEOUS
  Filled 2018-05-16 (×2): qty 0.3

## 2018-05-16 MED ORDER — PIPERACILLIN-TAZOBACTAM 3.375 G IVPB
3.3750 g | Freq: Two times a day (BID) | INTRAVENOUS | Status: DC
  Filled 2018-05-16: qty 50

## 2018-05-16 MED ORDER — PIPERACILLIN-TAZOBACTAM 3.375 G IVPB
3.3750 g | Freq: Two times a day (BID) | INTRAVENOUS | Status: DC
  Administered 2018-05-16 – 2018-05-18 (×5): 3.375 g via INTRAVENOUS
  Filled 2018-05-16 (×4): qty 50

## 2018-05-16 MED ORDER — ONDANSETRON HCL 4 MG/2ML IJ SOLN: 4.0000 mg | Freq: Four times a day (QID) | INTRAMUSCULAR | Status: DC | PRN

## 2018-05-16 MED ORDER — SODIUM CHLORIDE 0.9 % IV SOLN
INTRAVENOUS | Status: DC | PRN
  Administered 2018-05-16: 17:00:00 50 ug/min via INTRAVENOUS

## 2018-05-16 MED ORDER — CHLORHEXIDINE GLUCONATE CLOTH 2 % EX PADS
6.0000 | MEDICATED_PAD | Freq: Every day | CUTANEOUS | Status: DC
  Administered 2018-05-16 – 2018-05-24 (×5): 6 via TOPICAL

## 2018-05-16 MED ORDER — PROPOFOL 10 MG/ML IV BOLUS
INTRAVENOUS | Status: DC | PRN
  Administered 2018-05-16: 40 mg via INTRAVENOUS

## 2018-05-16 MED ORDER — SODIUM CHLORIDE 0.9 % IV SOLN
INTRAVENOUS | Status: DC
  Administered 2018-05-16 (×2): via INTRAVENOUS

## 2018-05-16 MED ORDER — PROPOFOL 500 MG/50ML IV EMUL
INTRAVENOUS | Status: DC | PRN
  Administered 2018-05-16: 75 ug/kg/min via INTRAVENOUS

## 2018-05-16 MED ORDER — PHENOL 1.4 % MT LIQD
1.0000 | OROMUCOSAL | Status: DC | PRN
  Filled 2018-05-16: qty 177

## 2018-05-16 MED ORDER — BISACODYL 10 MG RE SUPP: 10.0000 mg | Freq: Every day | RECTAL | Status: DC | PRN

## 2018-05-16 MED ORDER — FENTANYL CITRATE (PF) 100 MCG/2ML IJ SOLN: 25.0000 ug | INTRAMUSCULAR | Status: DC | PRN

## 2018-05-16 MED ORDER — ALUM & MAG HYDROXIDE-SIMETH 200-200-20 MG/5ML PO SUSP: 30.0000 mL | ORAL | Status: DC | PRN

## 2018-05-16 MED ORDER — FENTANYL CITRATE (PF) 100 MCG/2ML IJ SOLN
INTRAMUSCULAR | Status: AC
  Filled 2018-05-16: qty 4

## 2018-05-16 MED ORDER — MAGNESIUM CITRATE PO SOLN
1.0000 | Freq: Once | ORAL | Status: DC | PRN
  Filled 2018-05-16: qty 296

## 2018-05-16 MED ORDER — FENTANYL CITRATE (PF) 100 MCG/2ML IJ SOLN
INTRAMUSCULAR | Status: DC | PRN
  Administered 2018-05-16: 50 ug via INTRAVENOUS

## 2018-05-16 SURGICAL SUPPLY — 39 items
CANISTER SUCT 1200ML W/VALVE (MISCELLANEOUS) ×3 IMPLANT
CHLORAPREP W/TINT 26 (MISCELLANEOUS) ×3 IMPLANT
COVER WAND RF STERILE (DRAPES) ×3 IMPLANT
DRSG OPSITE POSTOP 4X6 (GAUZE/BANDAGES/DRESSINGS) ×3 IMPLANT
DRSG TEGADERM 6X8 (GAUZE/BANDAGES/DRESSINGS) ×3 IMPLANT
ELECT REM PT RETURN 9FT ADLT (ELECTROSURGICAL) ×3
ELECTRODE REM PT RTRN 9FT ADLT (ELECTROSURGICAL) ×1 IMPLANT
GAUZE SPONGE 4X4 12PLY STRL (GAUZE/BANDAGES/DRESSINGS) ×3 IMPLANT
GAUZE XEROFORM 1X8 LF (GAUZE/BANDAGES/DRESSINGS) ×3 IMPLANT
GLOVE BIOGEL PI IND STRL 7.5 (GLOVE) ×4 IMPLANT
GLOVE BIOGEL PI IND STRL 9 (GLOVE) ×1 IMPLANT
GLOVE BIOGEL PI INDICATOR 7.5 (GLOVE) ×8
GLOVE BIOGEL PI INDICATOR 9 (GLOVE) ×2
GLOVE SURG SYN 9.0  PF PI (GLOVE) ×2
GLOVE SURG SYN 9.0 PF PI (GLOVE) ×1 IMPLANT
GOWN SRG 2XL LVL 4 RGLN SLV (GOWNS) ×1 IMPLANT
GOWN STRL NON-REIN 2XL LVL4 (GOWNS) ×2
GOWN STRL REUS W/ TWL LRG LVL3 (GOWN DISPOSABLE) ×1 IMPLANT
GOWN STRL REUS W/TWL LRG LVL3 (GOWN DISPOSABLE) ×2
GUIDEWIRE THREADED 2.8 (WIRE) ×12 IMPLANT
HOLDER FOLEY CATH W/STRAP (MISCELLANEOUS) ×3 IMPLANT
KIT TURNOVER KIT A (KITS) ×3 IMPLANT
MAT ABSORB  FLUID 56X50 GRAY (MISCELLANEOUS) ×2
MAT ABSORB FLUID 56X50 GRAY (MISCELLANEOUS) ×1 IMPLANT
NEEDLE FILTER BLUNT 18X 1/2SAF (NEEDLE) ×2
NEEDLE FILTER BLUNT 18X1 1/2 (NEEDLE) ×1 IMPLANT
NS IRRIG 500ML POUR BTL (IV SOLUTION) ×3 IMPLANT
PACK HIP COMPR (MISCELLANEOUS) ×3 IMPLANT
SCALPEL PROTECTED #10 DISP (BLADE) ×6 IMPLANT
SCREW CANN 16 THRD/115 7.3 (Screw) ×3 IMPLANT
SCREW CANN 32 THRD/115 7.3 (Screw) ×6 IMPLANT
SCREW CANN 32 THRD/95 7.3 (Screw) ×3 IMPLANT
STAPLER SKIN PROX 35W (STAPLE) ×3 IMPLANT
SUT PROLENE 2 0 FS (SUTURE) ×3 IMPLANT
SUT VIC AB 2-0 SH 27 (SUTURE) ×2
SUT VIC AB 2-0 SH 27XBRD (SUTURE) ×1 IMPLANT
SYR 5ML LL (SYRINGE) ×3 IMPLANT
TAPE MICROFOAM 4IN (TAPE) ×3 IMPLANT
TRAY FOLEY MTR SLVR 16FR STAT (SET/KITS/TRAYS/PACK) ×3 IMPLANT

## 2018-05-16 NOTE — Op Note (Signed)
05/16/2018  6:19 PM  PATIENT:  Joseph Newman  73 y.o. male  PRE-OPERATIVE DIAGNOSIS:  Right hip Fracture impacted femoral neck  POST-OPERATIVE DIAGNOSIS:  Right hip Fracture  PROCEDURE:  Procedure(s): CANNULATED HIP PINNING (Right)  SURGEON: Laurene Footman, MD  ASSISTANTS: None  ANESTHESIA:   spinal  EBL:  Total I/O In: 250 [I.V.:200; IV Piggyback:50] Out: 1749 [Urine:300; Other:1502]  BLOOD ADMINISTERED:none  DRAINS: none   LOCAL MEDICATIONS USED:  NONE  SPECIMEN:  No Specimen  DISPOSITION OF SPECIMEN:  N/A  COUNTS:  YES  TOURNIQUET:  * No tourniquets in log *  IMPLANTS: Synthes 7.3 cannulated screws x4  DICTATION: .Dragon Dictation patient was brought to the operating room and after adequate anesthesia was obtained with a spinal anesthetic the patient was transferred to the fracture table with the left leg in the well-leg holder right foot in the traction boot with with no traction applied.  The C arm was brought in and good visualization of the fracture was obtained in AP and lateral imaging with no loss of alignment since admission.  After prepping and draping using a barrier drape method appropriate patient identification and timeout procedures were completed.  A small incision was made laterally and soft tissue was spread getting down to the bone on the lateral femoral cortex.  A single guidewire was first inserted and it was a center anterior position based on this guidewire 3 additional guidewires were placed in a diamond configuration.  Measurements were made off the wires drilling carried out followed by tapping and placing the cannulated screws.  Care was taken to make sure there is no penetration into the joint.  After all 4 screws were placed on the appropriate depth guidewires were removed and permanent C arm views obtained.  The wound was irrigated and then closed with 2-0 Vicryl deep and subcutaneous followed by skin staples followed by honeycomb dressing  PLAN OF  CARE: Continue as inpatient  PATIENT DISPOSITION:  PACU - hemodynamically stable.

## 2018-05-16 NOTE — Anesthesia Postprocedure Evaluation (Signed)
Anesthesia Post Note  Patient: [facility]  Procedure(s) Performed: CANNULATED HIP PINNING (Right Hip)  Patient location during evaluation: PACU Anesthesia Type: Spinal and General Level of consciousness: awake and alert Pain management: pain level controlled Vital Signs Assessment: post-procedure vital signs reviewed and stable Respiratory status: spontaneous breathing, nonlabored ventilation and respiratory function stable Cardiovascular status: blood pressure returned to baseline and stable Postop Assessment: no apparent nausea or vomiting Anesthetic complications: no     Last Vitals:  Vitals:   05/16/18 2006 05/16/18 2039  BP: 119/64 127/67  Pulse: 86 (!) 49  Resp: 19 20  Temp: 36.9 C 36.8 C  SpO2: 93% (!) 77%    Last Pain:  Vitals:   05/16/18 2039  TempSrc: Oral  PainSc:                  Durenda Hurt

## 2018-05-16 NOTE — Consult Note (Signed)
Pharmacy Antibiotic Note  Joseph Newman is a 73 y.o. male admitted on 05/15/2018 with Fevers/Hip Fracture.  Pharmacy has been consulted for Zosyn dosing. Patient receives Hemodialysis on Tuesday Thursday and Saturday  Plan: Zosyn 3.375g IV q12h (4 hour infusion).  Height: 6\' 5"  (195.6 cm) Weight: 199 lb 15.3 oz (90.7 kg) IBW/kg (Calculated) : 89.1  Temp (24hrs), Avg:99.3 F (37.4 C), Min:98 F (36.7 C), Max:100.6 F (38.1 C)  Recent Labs  Lab 05/15/18 1705 05/16/18 0255  WBC 7.6 7.7  CREATININE 5.23* 5.62*    Estimated Creatinine Clearance: 14.5 mL/min (A) (by C-G formula based on SCr of 5.62 mg/dL (H)).    No Known Allergies  Antimicrobials this admission: Zosyn 4/23 >>    Dose adjustments this admission:   Microbiology results: 4/23 BCx: pending 4/23 Surgical MRSA PCR: Positive  Thank you for allowing pharmacy to be a part of this patient's care.  Pearla Dubonnet, PharmD Clinical Pharmacist 05/16/2018 9:34 AM

## 2018-05-16 NOTE — Progress Notes (Signed)
[place] at [place] NAME: Joseph Newman    MR#:  [id number]  DATE OF BIRTH:  May 12, 1945  SUBJECTIVE:  CHIEF COMPLAINT:   Chief Complaint  Patient presents with   Fall   Patient noted to have fevers with temperature 100.6 this morning.  Denies any cough.  No shortness of breath.  Stat chest x-ray, urinalysis and blood cultures requested.  Empiric antibiotics initiated.  Patient scheduled for hemodialysis this morning and for surgery later today. Called and updated wife provided phone on treatment plans as outlined.  REVIEW OF SYSTEMS:  Review of Systems  Constitutional: Positive for fever. Negative for chills.  HENT: Negative for hearing loss and tinnitus.   Eyes: Negative for blurred vision and double vision.  Respiratory: Negative for cough and hemoptysis.   Cardiovascular: Negative for chest pain and palpitations.  Gastrointestinal: Negative for heartburn and nausea.  Genitourinary: Negative for dysuria and urgency.  Musculoskeletal: Negative for myalgias and neck pain.       Very minimal pain due to recent right hip fracture  Skin: Negative for itching and rash.  Neurological: Negative for dizziness and headaches.  Psychiatric/Behavioral: Negative for depression and hallucinations.    DRUG ALLERGIES:  No Known Allergies VITALS:  Blood pressure 119/70, pulse 97, temperature 99.6 F (37.6 C), temperature source Oral, resp. rate (!) 23, height 6\' 5"  (1.956 m), weight 91.3 kg, SpO2 93 %. PHYSICAL EXAMINATION:   Physical Exam  Constitutional: He is oriented to person, place, and time. He appears well-developed and well-nourished.  HENT:  Head: Normocephalic and atraumatic.  Right Ear: External ear normal.  Eyes: Pupils are equal, round, and reactive to light. Conjunctivae are normal. Right eye exhibits no discharge.  Neck: Normal range of motion. No thyromegaly present.  Cardiovascular: Normal rate, regular rhythm and normal heart  sounds.  Respiratory: Effort normal and breath sounds normal.  GI: Soft. Bowel sounds are normal. He exhibits no distension.  Musculoskeletal: Normal range of motion.        General: No edema.     Comments: Limitation of movement of right lower extremity secondary to pain from right hip fracture  Neurological: He is alert and oriented to person, place, and time.  Skin: Skin is warm. He is not diaphoretic. No erythema.   LABORATORY PANEL:  Male CBC Recent Labs  Lab 05/16/18 0255  WBC 7.7  HGB 11.5*  HCT 35.2*  PLT 209   ------------------------------------------------------------------------------------------------------------------ Chemistries  Recent Labs  Lab 05/15/18 1705 05/16/18 0255  NA 135 136  K 5.2* 4.6  CL 96* 100  CO2 25 23  GLUCOSE 146* 145*  BUN 41* 44*  CREATININE 5.23* 5.62*  CALCIUM 8.4* 8.5*  AST 19  --   ALT 13  --   ALKPHOS 87  --   BILITOT 0.9  --    RADIOLOGY:  Dg Chest Portable 1 View  Result Date: 05/15/2018 CLINICAL DATA:  Right hip pain. EXAM: PORTABLE CHEST 1 VIEW COMPARISON:  January 31, 2018 FINDINGS: The dialysis catheter is been removed. No pneumothorax. The heart size borderline. The hila and mediastinum are unremarkable. Increased interstitial prominence. IMPRESSION: Increased interstitial markings in the lungs consistent with pulmonary venous congestion/mild edema. Interval removal of right central line. No other abnormalities. Electronically Signed   By: Dorise Bullion III M.D   On: 05/15/2018 17:13   Dg Hip Unilat W Or Wo Pelvis 2-3 Views Right  Result Date: 05/15/2018 CLINICAL DATA:  Right hip pain  status post fall EXAM: DG HIP (WITH OR WITHOUT PELVIS) 2-3V RIGHT COMPARISON:  None. FINDINGS: Acute nondisplaced right subcapital femoral neck fracture. No other fracture or dislocation. Generalized osteopenia. IMPRESSION: 1. Acute nondisplaced right subcapital femoral neck fracture. Electronically Signed   By: Kathreen Devoid   On: 05/15/2018  17:11   ASSESSMENT AND PLAN:   Right hip fracture-s/p mechanical fall Patient already seen by orthopedic service.  Plans for surgery later today after hemodialysis. Pains appear controlled.  ESRD on HD TTS- chest x-ray with mild pulmonary edema.  Patient on 2 L O2. -Nephrology consulted and patient having hemodialysis this morning.  Fevers Temperature of 100.6 this morning. Stat chest x-ray, urinalysis and blood cultures requested. Initiated empiric antibiotics with IV Zosyn with pharmacist to dose pending culture results Hypertension-blood pressures well controlled -Continue home Coreg, olmesartan, amlodipine, HCTZ  Type 2 diabetes-blood sugars mildly elevated in the ED -SSI  Hyperlipidemia-stable -Continue home statin  BPH-stable -Continue home Flomax  DVT prophylaxis- SCDs.  To start pharmacologic DVT prophylaxis after surgery   I called and updated patient's wife over the phone on treatment plans.  She confirmed his CODE STATUS to be full code as well. All the records are reviewed and case discussed with Care Management/Social Worker. Management plans discussed with the patient, family and they are in agreement.  CODE STATUS: Full Code  TOTAL TIME TAKING CARE OF THIS PATIENT: 35 minutes.   More than 50% of the time was spent in counseling/coordination of care: YES  POSSIBLE D/C IN 3 DAYS, DEPENDING ON CLINICAL CONDITION.   Merla Sawka M.D on 05/16/2018 at 12:03 PM  Between 7am to 6pm - Pager - [phone]  After 6pm go to [email or web address] - [employer] [facility]  Office  [phone]  CC: Primary care physician; Steele Sizer, MD  Note: This dictation was prepared with Dragon dictation along with smaller phrase technology. Any transcriptional errors that result from this process are unintentional.

## 2018-05-16 NOTE — Progress Notes (Signed)
Post HD Tx    05/16/18 1230  Vital Signs  Temp 98.7 F (37.1 C)  Temp Source Oral  Pulse Rate 95  Pulse Rate Source Monitor  Resp 19  BP 125/66  BP Location Left Arm  BP Method Automatic  Patient Position (if appropriate) Lying  Oxygen Therapy  SpO2 95 %  O2 Device Nasal Cannula  O2 Flow Rate (L/min) 2 L/min  Pulse Oximetry Type Continuous  Pain Assessment  Pain Scale 0-10  Pain Score 0  Dialysis Weight  Weight 88.9 kg  Type of Weight Post-Dialysis  Post-Hemodialysis Assessment  Rinseback Volume (mL) 250 mL  KECN 55.6 V  Dialyzer Clearance Lightly streaked  Duration of HD Treatment -hour(s) 3 hour(s)  Hemodialysis Intake (mL) 500 mL  UF Total -Machine (mL) 2002 mL  Net UF (mL) 1502 mL  Tolerated HD Treatment Yes  AVG/AVF Arterial Site Held (minutes) 5 minutes  AVG/AVF Venous Site Held (minutes) 5 minutes  Fistula / Graft Right Upper arm Arteriovenous fistula  Placement Date: 05/15/18   Placed prior to admission: Yes  Orientation: Right  Access Location: Upper arm  Access Type: Arteriovenous fistula  Site Condition No complications  Fistula / Graft Assessment Present;Thrill;Bruit  Status Deaccessed  Drainage Description None

## 2018-05-16 NOTE — Anesthesia Procedure Notes (Signed)
Date/Time: 05/16/2018 5:41 PM Performed by: Nelda Marseille, CRNA Pre-anesthesia Checklist: Patient identified, Emergency Drugs available, Suction available, Patient being monitored and Timeout performed Oxygen Delivery Method: Simple face mask

## 2018-05-16 NOTE — Anesthesia Preprocedure Evaluation (Addendum)
Anesthesia Evaluation  Patient identified by MRN, date of birth, ID band Patient awake    Reviewed: Allergy & Precautions, H&P , NPO status , Patient's Chart, lab work & pertinent test results  Airway Mallampati: II  TM Distance: >3 FB Neck ROM: full    Dental  (+) Poor Dentition   Pulmonary sleep apnea , COPD, former smoker,           Cardiovascular hypertension, (-) angina(-) Past MI, (-) Cardiac Stents, (-) CABG and (-) CHF (-) dysrhythmias (-) pacemaker     Neuro/Psych PSYCHIATRIC DISORDERS Depression negative neurological ROS  negative psych ROS   GI/Hepatic negative GI ROS, Neg liver ROS, neg GERD  ,  Endo/Other  diabetes  Renal/GU ESRFRenal disease     Musculoskeletal  (+) Arthritis ,   Abdominal   Peds  Hematology  (+) Blood dyscrasia, anemia ,   Anesthesia Other Findings Anesthesia consent obtained from wife [name] via telephone at [phone].  KLF  Past Medical History: 10/62/6948: Acute metabolic encephalopathy     Comment:  Per my chart No date: Arthritis No date: Chronic airway obstruction, not elsewhere classified     Comment:  worked in [employer] and was exposed to many chemicals 05/03/2017: CKD (chronic kidney disease) stage 2, GFR 60-89 ml/min     Comment:  Dr. Holley Raring, nephrologist.  on dialysis tu - thur - sat No date: Depressive disorder, not elsewhere classified 01/2018: Dialysis patient [facility])     Comment:  dialysis tuesday - thursday - saturday No date: Gout No date: Gout No date: Heart murmur     Comment:  followed by dr. 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 No date: Microalbuminuria No date: Nodular prostate without urinary obstruction No date: Obesity, unspecified No date: Plantar wart No date: Sleep apnea     Comment:  years  ago but never used a cpap No date: Synovitis and tenosynovitis, unspecified No date: Type II or unspecified type diabetes mellitus without  mention of complication, uncontrolled No date: Unspecified venous (peripheral) insufficiency No date: UTI (urinary tract infection)     Comment:  frequent with ESBL  Past Surgical History: 03/08/2018: AV FISTULA PLACEMENT; Right     Comment:  Procedure: ARTERIOVENOUS (AV) FISTULA CREATION;                Surgeon: Katha Cabal, MD;  Location: [facility] ORS;                Service: Vascular;  Laterality: Right; 09/28/2017: DIALYSIS/PERMA CATHETER INSERTION; N/A     Comment:  Procedure: DIALYSIS/PERMA CATHETER INSERTION;  Surgeon:               Katha Cabal, MD;  Location: [facility];               Service: Cardiovascular;  Laterality: N/A; No date: DIALYSIS/PERMA CATHETER INSERTION     Comment:  peritoneal catheter No date: foot surgery; Right     Comment:  repair of calus 03/08/2018: REMOVAL OF A DIALYSIS CATHETER; N/A     Comment:  Procedure: REMOVAL OF A DIALYSIS CATHETER;  Surgeon:               Katha Cabal, MD;  Location: [facility] ORS;  Service:               Vascular;  Laterality: N/A;  BMI    Body Mass Index:  23.24 kg/m      Reproductive/Obstetrics negative OB ROS                         Anesthesia Physical Anesthesia Plan  ASA: IV  Anesthesia Plan: Spinal   Post-op Pain Management:    Induction:   PONV Risk Score and Plan:   Airway Management Planned:   Additional Equipment:   Intra-op Plan:   Post-operative Plan:   Informed Consent: I have reviewed the patients History and Physical, chart, labs and discussed the procedure including the risks, benefits and alternatives for the proposed anesthesia with the patient or authorized representative who has indicated his/her understanding and acceptance.     Dental Advisory Given  Plan Discussed with: Anesthesiologist and  CRNA  Anesthesia Plan Comments: (Freq PVC's with underlying sinus rhythm on preop EKG.)        Anesthesia Quick Evaluation

## 2018-05-16 NOTE — Progress Notes (Signed)
[place], [place] 05/16/18  Subjective:   Patient known to our practice from outpatient dialysis.  He presented via EMS from home after a fall on April 22.  Per EMS notes, patient missed the chair and fell straight back.  Patient was evaluated by orthopedics and hip pinning was recommended.  Surgery is planned for later today.  Seen during dialysis.  Tolerating well   HEMODIALYSIS FLOWSHEET:  Blood Flow Rate (mL/min): 350 mL/min Arterial Pressure (mmHg): -170 mmHg Venous Pressure (mmHg): 210 mmHg Transmembrane Pressure (mmHg): 50 mmHg Ultrafiltration Rate (mL/min): 800 mL/min Dialysate Flow Rate (mL/min): 800 ml/min Conductivity: Machine : 14 Conductivity: Machine : 14 Dialysis Fluid Bolus: Normal Saline Bolus Amount (mL): 250 mL    Objective:  Vital signs in last 24 hours:  Temp:  [98 F (36.7 C)-100.6 F (38.1 C)] 99.6 F (37.6 C) (04/23 0921) Pulse Rate:  [32-104] 97 (04/23 1115) Resp:  [10-26] 23 (04/23 1115) BP: (114-152)/(48-87) 119/70 (04/23 1115) SpO2:  [88 %-97 %] 93 % (04/23 1115) Weight:  [90.7 kg-91.3 kg] 91.3 kg (04/23 0921)  Weight change:  Filed Weights   05/15/18 1600 05/16/18 0921  Weight: 90.7 kg 91.3 kg    Intake/Output:   No intake or output data in the 24 hours ending 05/16/18 1205   Physical Exam: General:  Chronically ill-appearing, laying in the bed  HEENT  anicteric, moist oral mucous membranes  Neck  supple  Pulm/lungs  normal breathing effort, clear anteriorly and laterally  CVS/Heart  irregular, no rub  Abdomen:   Soft, nontender  Extremities:  Trace peripheral edema  Neurologic:  Arousable, able to follow simple commands  Skin:  No acute rashes  Access:  Right arm AV fistula       Basic Metabolic Panel:  Recent Labs  Lab 05/15/18 1705 05/16/18 0255  NA 135 136  K 5.2* 4.6  CL 96* 100  CO2 25 23  GLUCOSE 146* 145*  BUN 41* 44*  CREATININE 5.23* 5.62*  CALCIUM 8.4* 8.5*      CBC: Recent Labs  Lab 05/15/18 1705 05/16/18 0255  WBC 7.6 7.7  NEUTROABS 5.2  --   HGB 11.8* 11.5*  HCT 35.9* 35.2*  MCV 86.3 86.9  PLT 209 209      Lab Results  Component Value Date   HEPBSAG Negative 09/27/2017      Microbiology:  Recent Results (from the past 240 hour(s))  Surgical PCR screen     Status: Abnormal   Collection Time: 05/15/18 10:05 PM  Result Value Ref Range Status   MRSA, PCR POSITIVE (A) NEGATIVE Final    Comment: RESULT CALLED TO, READ BACK BY AND VERIFIED WITH: Kathyrn Drown RN [phone] 05/15/2018 HNM    Staphylococcus aureus POSITIVE (A) NEGATIVE Final    Comment: (NOTE) The Xpert SA Assay (FDA approved for NASAL specimens in patients 94 years of age and older), is one component of a comprehensive surveillance program. It is not intended to diagnose infection nor to guide or monitor treatment. Performed at [facility], [address] [place], [place] [address]     Coagulation Studies: No results for input(s): LABPROT, INR in the last 72 hours.  Urinalysis: No results for input(s): COLORURINE, LABSPEC, PHURINE, GLUCOSEU, HGBUR, BILIRUBINUR, KETONESUR, PROTEINUR, UROBILINOGEN, NITRITE, LEUKOCYTESUR in the last 72 hours.  Invalid input(s): APPERANCEUR    Imaging: Dg Chest Portable 1 View  Result Date: 05/15/2018 CLINICAL DATA:  Right hip pain. EXAM: PORTABLE CHEST 1 VIEW COMPARISON:  January 31, 2018 FINDINGS: The dialysis catheter is been removed. No pneumothorax. The heart size borderline. The hila and mediastinum are unremarkable. Increased interstitial prominence. IMPRESSION: Increased interstitial markings in the lungs consistent with pulmonary venous congestion/mild edema. Interval removal of right central line. No other abnormalities. Electronically Signed   By: Dorise Bullion III M.D   On: 05/15/2018 17:13   Dg Hip Unilat W Or Wo Pelvis 2-3 Views Right  Result Date: 05/15/2018 CLINICAL DATA:  Right hip pain status  post fall EXAM: DG HIP (WITH OR WITHOUT PELVIS) 2-3V RIGHT COMPARISON:  None. FINDINGS: Acute nondisplaced right subcapital femoral neck fracture. No other fracture or dislocation. Generalized osteopenia. IMPRESSION: 1. Acute nondisplaced right subcapital femoral neck fracture. Electronically Signed   By: Kathreen Devoid   On: 05/15/2018 17:11     Medications:   .  ceFAZolin (ANCEF) IV    . piperacillin-tazobactam (ZOSYN)  IV     . allopurinol  50 mg Oral QODAY  . irbesartan  300 mg Oral Daily   And  . amLODipine  10 mg Oral Daily   And  . hydrochlorothiazide  25 mg Oral Daily  . aspirin EC  81 mg Oral Daily  . atorvastatin  40 mg Oral Daily  . carvedilol  6.25 mg Oral BID WC  . Chlorhexidine Gluconate Cloth  6 each Topical Q0600  . insulin aspart  0-5 Units Subcutaneous QHS  . insulin aspart  0-9 Units Subcutaneous TID WC  . mupirocin ointment  1 application Nasal BID  . tamsulosin  0.4 mg Oral QPC breakfast   acetaminophen **OR** acetaminophen, morphine injection, ondansetron **OR** ondansetron (ZOFRAN) IV, polyethylene glycol  Assessment/ Plan:  73 y.o. African-American male with end-stage renal disease, depression, gout, BPH, history of urinary retention, followed by urologist Dr. Bernardo Heater, history of colonization of urine  Davita Graham/TTS/103.5 kg/Rt arm AVF/  1.  End-stage renal disease -Arrange for hemodialysis on TTS schedule  2.  Anemia of chronic kidney disease -Hemoglobin 11.5.  Consider starting Epogen with next dialysis treatment  3.  Secondary hyperparathyroidism -Phosphorus is trending higher as outpatient.  We will continue to monitor closely.  We will consider phosphorus binders this admission  4.  Right hip fracture -Surgical intervention planned for this afternoon   LOS: [place] 4/23/202012:05 PM  [place], [place]  Note: This note was prepared with Dragon dictation. Any transcription errors are  unintentional

## 2018-05-16 NOTE — Transfer of Care (Signed)
Immediate Anesthesia Transfer of Care Note  [facility]: [facility]  Procedure(s) Performed: CANNULATED HIP PINNING (Right Hip)  Patient Location: PACU  Anesthesia Type:Spinal  Level of Consciousness: sedated  Airway & Oxygen Therapy: Patient Spontanous Breathing and Patient connected to face mask oxygen  Post-op Assessment: Report given to RN and Post -op Vital signs reviewed and stable  Post vital signs: Reviewed and stable  Last Vitals:  Vitals Value Taken Time  BP    Temp    Pulse    Resp    SpO2      Last Pain:  Vitals:   05/16/18 1653  TempSrc: Temporal  PainSc:          Complications: No apparent anesthesia complications

## 2018-05-16 NOTE — Progress Notes (Signed)
HD Tx started w/o complication    62/03/55 0924  Vital Signs  Pulse Rate 100  Pulse Rate Source Monitor  Resp 10  BP (!) 127/48  BP Location Left Arm  BP Method Automatic  Patient Position (if appropriate) Lying  Oxygen Therapy  SpO2 90 %  O2 Device Nasal Cannula  O2 Flow Rate (L/min) 2 L/min  Pulse Oximetry Type Continuous  During Hemodialysis Assessment  Blood Flow Rate (mL/min) 350 mL/min  Arterial Pressure (mmHg) -170 mmHg  Venous Pressure (mmHg) 200 mmHg  Transmembrane Pressure (mmHg) 50 mmHg  Ultrafiltration Rate (mL/min) 800 mL/min  Dialysate Flow Rate (mL/min) 800 ml/min  Conductivity: Machine  15.5  HD Safety Checks Performed Yes  Dialysis Fluid Bolus Normal Saline  Bolus Amount (mL) 250 mL  Intra-Hemodialysis Comments Tx initiated  Fistula / Graft Right Upper arm Arteriovenous fistula  Placement Date: 05/15/18   Placed prior to admission: Yes  Orientation: Right  Access Location: Upper arm  Access Type: Arteriovenous fistula  Status Accessed  Needle Size 16g

## 2018-05-16 NOTE — Progress Notes (Signed)
Pts temp 100.6, recheck 99.8. MD Ojie notified. MD placing orders.

## 2018-05-16 NOTE — Progress Notes (Signed)
Verbal order fore tele By MD Stark Jock. Plt placed on tele box 40-44.

## 2018-05-16 NOTE — Anesthesia Procedure Notes (Signed)
Spinal  Patient location during procedure: OR Start time: 05/16/2018 5:05 PM End time: 05/16/2018 5:20 PM Staffing Resident/CRNA: Nelda Marseille, CRNA Performed: resident/CRNA  Preanesthetic Checklist Completed: patient identified, site marked, surgical consent, pre-op evaluation, timeout performed, IV checked, risks and benefits discussed and monitors and equipment checked Spinal Block Patient position: sitting Prep: Betadine Patient monitoring: heart rate, continuous pulse ox, blood pressure and cardiac monitor Approach: midline Location: L4-5 Injection technique: single-shot Needle Needle type: Whitacre and Introducer  Needle gauge: 24 G Needle length: 9 cm Additional Notes Negative paresthesia. Negative blood return. Positive free-flowing CSF. Expiration date of kit checked and confirmed. Patient tolerated procedure well, without complications.

## 2018-05-16 NOTE — Consult Note (Signed)
Reason for consult is right hip pain after fall  History: Patient reports missing a chair while trying to sit down today prior to admission.  He is having hip pain and difficulty with ambulation.  He has a household ambulator and other than dialysis does not leave the house much.  He uses a cane to ambulate.  On exam he is holding the leg in slight flexion he has pain with logrolling and distally no palpable pulses.  He is able flex extend the toes.  Radiographs show an impacted valgus femoral neck fracture with mild arthritis  Clinical impression is impacted right femoral neck fracture  Recommendation is for hip pinning to try to prevent displacement.  I discussed this with the patient as well as potential for later avascular necrosis or failure fixation with potential for revision to hemiarthroplasty.  Plan on surgery later today after dialysis.

## 2018-05-16 NOTE — Progress Notes (Signed)
HD Tx completed tolerated well, UF goal met   05/16/18 1224  Vital Signs  Pulse Rate 94  Pulse Rate Source Monitor  Resp 18  BP 112/65  BP Location Left Arm  BP Method Automatic  Patient Position (if appropriate) Lying  Oxygen Therapy  SpO2 95 %  O2 Device Nasal Cannula  O2 Flow Rate (L/min) 2 L/min  Pulse Oximetry Type Continuous  During Hemodialysis Assessment  Blood Flow Rate (mL/min) 350 mL/min  Arterial Pressure (mmHg) -170 mmHg  Venous Pressure (mmHg) 210 mmHg  Transmembrane Pressure (mmHg) 50 mmHg  Ultrafiltration Rate (mL/min) 800 mL/min  Dialysate Flow Rate (mL/min) 800 ml/min  Conductivity: Machine  144  HD Safety Checks Performed Yes  KECN 55.6 KECN  Dialysis Fluid Bolus Normal Saline  Bolus Amount (mL) 250 mL  Intra-Hemodialysis Comments Tx completed;Tolerated well

## 2018-05-16 NOTE — Anesthesia Post-op Follow-up Note (Signed)
Anesthesia QCDR form completed.        

## 2018-05-16 NOTE — Progress Notes (Signed)
Pre HD Tx   05/16/18 0921  Vital Signs  Temp 99.6 F (37.6 C)  Temp Source Oral  Pulse Rate 100  Pulse Rate Source Monitor  Resp 13  BP 123/77  BP Location Left Arm  BP Method Automatic  Patient Position (if appropriate) Lying  Oxygen Therapy  SpO2 96 %  O2 Device Nasal Cannula  O2 Flow Rate (L/min) 2 L/min  Pulse Oximetry Type Continuous  Pain Assessment  Pain Scale 0-10  Pain Score 0  Dialysis Weight  Weight 91.3 kg  Type of Weight Pre-Dialysis  Time-Out for Hemodialysis  What Procedure? HD   Pt Identifiers(min of two) First/Last Name;MRN/Account#  Correct Site? Yes  Correct Side? Yes  Correct Procedure? Yes  Consents Verified? Yes  Rad Studies Available? Yes  Safety Precautions Reviewed? Yes  Engineer, civil (consulting) Number 2  Station Number 1  UF/Alarm Test Passed  Conductivity: Meter 14  Conductivity: Machine  14  pH 7.2  Reverse Osmosis Main  Normal Saline Lot Number [id number]  Dialyzer Lot Number [id number]  Disposable Set Lot Number [id number]  Machine Temperature 98.6 F (37 C)  Musician and Audible Yes  Blood Lines Intact and Secured Yes  Pre Treatment Patient Checks  Vascular access used during treatment Fistula  Hepatitis B Surface Antigen Results Negative  Date Hepatitis B Surface Antigen Drawn 04/25/18  Hepatitis B Surface Antibody 3  Date Hepatitis B Surface Antibody Drawn 10/01/17  Hemodialysis Consent Verified Yes  Hemodialysis Standing Orders Initiated Yes  ECG (Telemetry) Monitor On Yes  Prime Ordered Normal Saline  Length of  DialysisTreatment -hour(s) 3 Hour(s)  Dialysis Treatment Comments Na 140  Dialyzer Elisio 17H NR  Dialysate 3K, 2.5 Ca  Dialysis Anticoagulant None  Dialysate Flow Ordered 800  Blood Flow Rate Ordered 400 mL/min  Ultrafiltration Goal 1.5 Liters  Dialysis Blood Pressure Support Ordered Normal Saline  Education / Care Plan  Dialysis Education Provided Yes  Documented Education in Care Plan Yes  Fistula /  Graft Right Upper arm Arteriovenous fistula  Placement Date: 05/15/18   Placed prior to admission: Yes  Orientation: Right  Access Location: Upper arm  Access Type: Arteriovenous fistula  Site Condition No complications  Fistula / Graft Assessment Present;Thrill;Bruit  Status Patent  Drainage Description None

## 2018-05-17 ENCOUNTER — Encounter: Payer: Self-pay | Admitting: Orthopedic Surgery

## 2018-05-17 LAB — CBC
HCT: 35.4 % — ABNORMAL LOW (ref 39.0–52.0)
Hemoglobin: 11.6 g/dL — ABNORMAL LOW (ref 13.0–17.0)
MCH: 28.3 pg (ref 26.0–34.0)
MCHC: 32.8 g/dL (ref 30.0–36.0)
MCV: 86.3 fL (ref 80.0–100.0)
Platelets: 188 10*3/uL (ref 150–400)
RBC: 4.1 MIL/uL — ABNORMAL LOW (ref 4.22–5.81)
RDW: 15 % (ref 11.5–15.5)
WBC: 9.5 10*3/uL (ref 4.0–10.5)
nRBC: 0 % (ref 0.0–0.2)

## 2018-05-17 LAB — URINALYSIS, ROUTINE W REFLEX MICROSCOPIC
Bacteria, UA: NONE SEEN
RBC / HPF: >50 RBC/hpf — ABNORMAL HIGH (ref 0–5)
Specific Gravity, Urine: 1.012 (ref 1.005–1.030)
Squamous Epithelial / HPF: NONE SEEN (ref 0–5)
WBC, UA: >50 WBC/hpf — ABNORMAL HIGH (ref 0–5)

## 2018-05-17 LAB — BASIC METABOLIC PANEL
Anion gap: 16 — ABNORMAL HIGH (ref 5–15)
BUN: 40 mg/dL — ABNORMAL HIGH (ref 8–23)
CO2: 24 mmol/L (ref 22–32)
Calcium: 8.1 mg/dL — ABNORMAL LOW (ref 8.9–10.3)
Chloride: 98 mmol/L (ref 98–111)
Creatinine, Ser: 5.3 mg/dL — ABNORMAL HIGH (ref 0.61–1.24)
GFR calc Af Amer: 11 mL/min — ABNORMAL LOW (ref >60)
GFR calc non Af Amer: 10 mL/min — ABNORMAL LOW (ref >60)
Glucose, Bld: 155 mg/dL — ABNORMAL HIGH (ref 70–99)
Potassium: 4.4 mmol/L (ref 3.5–5.1)
Sodium: 138 mmol/L (ref 135–145)

## 2018-05-17 LAB — MAGNESIUM: Magnesium: 1.9 mg/dL (ref 1.7–2.4)

## 2018-05-17 LAB — GLUCOSE, CAPILLARY
Glucose-Capillary: 143 mg/dL — ABNORMAL HIGH (ref 70–99)
Glucose-Capillary: 161 mg/dL — ABNORMAL HIGH (ref 70–99)
Glucose-Capillary: 175 mg/dL — ABNORMAL HIGH (ref 70–99)
Glucose-Capillary: 148 mg/dL — ABNORMAL HIGH (ref 70–99)

## 2018-05-17 LAB — PHOSPHORUS: Phosphorus: 5.2 mg/dL — ABNORMAL HIGH (ref 2.5–4.6)

## 2018-05-17 MED ORDER — TRAMADOL HCL 50 MG PO TABS: 50.0000 mg | ORAL_TABLET | Freq: Four times a day (QID) | ORAL | 1 refills | Status: DC | PRN

## 2018-05-17 MED ORDER — ENOXAPARIN SODIUM 40 MG/0.4ML ~~LOC~~ SOLN: 40.0000 mg | SUBCUTANEOUS | 0 refills | Status: DC

## 2018-05-17 MED ORDER — OXYCODONE HCL 5 MG PO TABS
5.0000 mg | ORAL_TABLET | ORAL | Status: DC | PRN
  Administered 2018-05-17: 5 mg via ORAL
  Filled 2018-05-17: qty 1

## 2018-05-17 MED ORDER — TRAMADOL HCL 50 MG PO TABS
50.0000 mg | ORAL_TABLET | Freq: Four times a day (QID) | ORAL | Status: DC | PRN
  Administered 2018-05-17: 50 mg via ORAL
  Filled 2018-05-17: qty 1

## 2018-05-17 MED ORDER — OXYCODONE HCL 5 MG PO TABS: 5.0000 mg | ORAL_TABLET | ORAL | 0 refills | Status: DC | PRN

## 2018-05-17 NOTE — Progress Notes (Signed)
[place], [place] 05/17/18  Subjective:   Patient known to our practice from outpatient dialysis.  He presented via EMS from home after a fall on April 22.  Per EMS notes, patient missed the chair and fell straight back.  Patient was evaluated by orthopedics and hip pinning was recommended.  Underwent right hip pinning by Dr. Rudene Christians on 4/23 Today, he is doing well.  Denies any acute pain, nausea or vomiting.  No shortness of breath.  Appears to be in good spirits    Objective:  Vital signs in last 24 hours:  Temp:  [97.7 F (36.5 C)-100.4 F (38 C)] 98.9 F (37.2 C) (04/24 0819) Pulse Rate:  [31-101] 92 (04/24 0922) Resp:  [14-33] 17 (04/24 0819) BP: (93-148)/(50-86) 121/86 (04/24 0922) SpO2:  [77 %-99 %] 93 % (04/24 0819) Weight:  [88.9 kg] 88.9 kg (04/23 1616)  Weight change: 0.6 kg Filed Weights   05/16/18 0921 05/16/18 1230 05/16/18 1616  Weight: 91.3 kg 88.9 kg 88.9 kg    Intake/Output:    Intake/Output Summary (Last 24 hours) at 05/17/2018 1311 Last data filed at 05/17/2018 0400 Gross per 24 hour  Intake 560.4 ml  Output 20 ml  Net 540.4 ml     Physical Exam: General:  Chronically ill-appearing, laying in the bed  HEENT  anicteric, moist oral mucous membranes  Neck  supple  Pulm/lungs  normal breathing effort, clear anteriorly and laterally  CVS/Heart  irregular, no rub  Abdomen:   Soft, nontender  Extremities:  Trace peripheral edema  Neurologic:  Alert, able to follow simple commands  Skin:  No acute rashes  Access:  Right arm AV fistula       Basic Metabolic Panel:  Recent Labs  Lab 05/15/18 1705 05/16/18 0255 05/16/18 1647 05/17/18 0539  NA 135 136 138 138  K 5.2* 4.6 4.1 4.4  CL 96* 100  --  98  CO2 25 23  --  24  GLUCOSE 146* 145* 123* 155*  BUN 41* 44*  --  40*  CREATININE 5.23* 5.62*  --  5.30*  CALCIUM 8.4* 8.5*  --  8.1*  MG  --   --   --  1.9  PHOS  --   --   --  5.2*     CBC: Recent Labs  Lab  05/15/18 1705 05/16/18 0255 05/16/18 1647 05/17/18 0539  WBC 7.6 7.7  --  9.5  NEUTROABS 5.2  --   --   --   HGB 11.8* 11.5* 10.9* 11.6*  HCT 35.9* 35.2* 32.0* 35.4*  MCV 86.3 86.9  --  86.3  PLT 209 209  --  188      Lab Results  Component Value Date   HEPBSAG Negative 09/27/2017      Microbiology:  Recent Results (from the past 240 hour(s))  Surgical PCR screen     Status: Abnormal   Collection Time: 05/15/18 10:05 PM  Result Value Ref Range Status   MRSA, PCR POSITIVE (A) NEGATIVE Final    Comment: RESULT CALLED TO, READ BACK BY AND VERIFIED WITH: Kathyrn Drown RN [id number] 05/15/2018 HNM    Staphylococcus aureus POSITIVE (A) NEGATIVE Final    Comment: (NOTE) The Xpert SA Assay (FDA approved for NASAL specimens in patients 17 years of age and older), is one component of a comprehensive surveillance program. It is not intended to diagnose infection nor to guide or monitor treatment. Performed at [facility], [address] [place],  [place] [address]   CULTURE, BLOOD (ROUTINE X 2) w Reflex to ID Panel     Status: None (Preliminary result)   Collection Time: 05/16/18 10:22 AM  Result Value Ref Range Status   Specimen Description BLOOD LEFT ARM  Final   Special Requests   Final    BOTTLES DRAWN AEROBIC AND ANAEROBIC Blood Culture adequate volume   Culture   Final    NO GROWTH < 24 HOURS Performed at [facility], [address], [place], [place] [address]    Report Status PENDING  Incomplete  CULTURE, BLOOD (ROUTINE X 2) w Reflex to ID Panel     Status: None (Preliminary result)   Collection Time: 05/16/18 10:29 AM  Result Value Ref Range Status   Specimen Description BLOOD LEFT ANTECUBITAL  Final   Special Requests   Final    BOTTLES DRAWN AEROBIC AND ANAEROBIC Blood Culture adequate volume   Culture   Final    NO GROWTH < 24 HOURS Performed at [facility], [address] [place], [place] [address]    Report Status PENDING   Incomplete    Coagulation Studies: No results for input(s): LABPROT, INR in the last 72 hours.  Urinalysis: Recent Labs    05/16/18 0417  COLORURINE GROSSLY BLOODY*  LABSPEC 1.012  PHURINE TEST NOT REPORTED DUE TO COLOR INTERFERENCE OF URINE PIGMENT  GLUCOSEU TEST NOT REPORTED DUE TO COLOR INTERFERENCE OF URINE PIGMENT*  HGBUR TEST NOT REPORTED DUE TO COLOR INTERFERENCE OF URINE PIGMENT*  BILIRUBINUR TEST NOT REPORTED DUE TO COLOR INTERFERENCE OF URINE PIGMENT*  KETONESUR TEST NOT REPORTED DUE TO COLOR INTERFERENCE OF URINE PIGMENT*  PROTEINUR TEST NOT REPORTED DUE TO COLOR INTERFERENCE OF URINE PIGMENT*  NITRITE TEST NOT REPORTED DUE TO COLOR INTERFERENCE OF URINE PIGMENT*  LEUKOCYTESUR TEST NOT REPORTED DUE TO COLOR INTERFERENCE OF URINE PIGMENT*      Imaging: Dg Chest 1 View  Result Date: 05/16/2018 CLINICAL DATA:  73 year old male with a history of fever and hypertension EXAM: CHEST  1 VIEW COMPARISON:  05/15/2018, 01/31/2018 FINDINGS: Cardiomediastinal silhouette unchanged in size and contour. Similar appearance of reticulonodular opacities throughout the lungs, worsened from the plain film of 01/31/2018 and similar to 05/15/2018 No new confluent airspace disease.  No pleural effusion. IMPRESSION: Similar appearance of mixed interstitial and airspace disease, potentially edema or multifocal infection. No large pleural effusion or pneumothorax. Electronically Signed   By: Corrie Mckusick D.O.   On: 05/16/2018 15:10   Dg Chest Portable 1 View  Result Date: 05/15/2018 CLINICAL DATA:  Right hip pain. EXAM: PORTABLE CHEST 1 VIEW COMPARISON:  January 31, 2018 FINDINGS: The dialysis catheter is been removed. No pneumothorax. The heart size borderline. The hila and mediastinum are unremarkable. Increased interstitial prominence. IMPRESSION: Increased interstitial markings in the lungs consistent with pulmonary venous congestion/mild edema. Interval removal of right central line. No other  abnormalities. Electronically Signed   By: Dorise Bullion III M.D   On: 05/15/2018 17:13   Dg Hip Operative Unilat W Or W/o Pelvis Right  Result Date: 05/16/2018 CLINICAL DATA:  Hip replacement EXAM: OPERATIVE right HIP (WITH PELVIS IF PERFORMED) 2 VIEWS TECHNIQUE: Fluoroscopic spot image(s) were submitted for interpretation post-operatively. COMPARISON:  05/15/2018 FINDINGS: Two low resolution intraoperative spot views of the right hip. Total fluoroscopy time was 1 minutes 12 seconds. The images demonstrate threaded screw fixation of right femoral neck fracture with normal alignment. IMPRESSION: Intraoperative fluoroscopic assistance provided during surgical fixation of right hip fracture. Electronically  Signed   By: Donavan Foil M.D.   On: 05/16/2018 18:29   Dg Hip Unilat W Or Wo Pelvis 2-3 Views Right  Result Date: 05/15/2018 CLINICAL DATA:  Right hip pain status post fall EXAM: DG HIP (WITH OR WITHOUT PELVIS) 2-3V RIGHT COMPARISON:  None. FINDINGS: Acute nondisplaced right subcapital femoral neck fracture. No other fracture or dislocation. Generalized osteopenia. IMPRESSION: 1. Acute nondisplaced right subcapital femoral neck fracture. Electronically Signed   By: Kathreen Devoid   On: 05/15/2018 17:11     Medications:   . sodium chloride 50 mL/hr at 05/16/18 2206  .  ceFAZolin (ANCEF) IV 1 g (05/17/18 0510)  . piperacillin-tazobactam (ZOSYN)  IV 3.375 g (05/17/18 0956)   . allopurinol  50 mg Oral QODAY  . irbesartan  300 mg Oral Daily   And  . amLODipine  10 mg Oral Daily   And  . hydrochlorothiazide  25 mg Oral Daily  . aspirin EC  81 mg Oral Daily  . atorvastatin  40 mg Oral Daily  . carvedilol  6.25 mg Oral BID WC  . Chlorhexidine Gluconate Cloth  6 each Topical Q0600  . docusate sodium  100 mg Oral BID  . enoxaparin (LOVENOX) injection  30 mg Subcutaneous Q24H  . insulin aspart  0-5 Units Subcutaneous QHS  . insulin aspart  0-9 Units Subcutaneous TID WC  . mupirocin ointment  1  application Nasal BID  . tamsulosin  0.4 mg Oral QPC breakfast   acetaminophen **OR** acetaminophen, alum & mag hydroxide-simeth, bisacodyl, magnesium citrate, magnesium hydroxide, menthol-cetylpyridinium **OR** phenol, morphine injection, ondansetron **OR** ondansetron (ZOFRAN) IV, oxyCODONE, polyethylene glycol, traMADol  Assessment/ Plan:  73 y.o. African-American male with end-stage renal disease, depression, gout, BPH, history of urinary retention, followed by urologist Dr. Bernardo Heater, history of colonization of urine  Davita Graham/TTS/103.5 kg/Rt arm AVF/  1.  End-stage renal disease -Arrange for hemodialysis on TTS schedule while inpatient - small length needles to be used for HD  2.  Anemia of chronic kidney disease -Hemoglobin 11.6.   - Consider starting Epogen with next dialysis treatment  3.  Secondary hyperparathyroidism -Phosphorus is trending higher as outpatient.  We will continue to monitor closely.  We will consider phosphorus binders this admission  4.  Right hip fracture Underwent right hip pinning on 4/23   LOS: 2 Aveyah Greenwood 4/24/20201:11 PM  [place], [place]  Note: This note was prepared with Dragon dictation. Any transcription errors are unintentional

## 2018-05-17 NOTE — Evaluation (Signed)
Physical Therapy Evaluation Patient Details Name: Joseph Newman MRN: [id number] DOB: [birth year] Today's Date: 05/17/2018   History of Present Illness  Pt is 73 yo male s/p cannulated hip pinning after a fall at home. PMH of COPD, HTN, renal disease, HLD, DM  Clinical Impression  Pt alert, with flat affect, oriented to self and situation. Pt reported living with his wife who per pt assists with ADLs, dependent for IADLs, and pt reported ambulating with walker at baseline.  Pt with significant pain with bed level exercises. Unable to perform without physical assist for RLE and LLE demonstrating decreased activity tolerance, strength, and endurance. Further mobility deferred at this time due to pt complaints and signs/symptoms of pain, reported 8/10 of RLE. The patient demonstrated deficits (see "PT Problem List") that impede the patient's functional abilities, safety, and mobility and would benefit from skilled PT intervention. Recommendation is STR due to current level of assistance needed, pending further evaluation of mobility.    Follow Up Recommendations SNF    Equipment Recommendations  Rolling walker with 5" wheels    Recommendations for Other Services OT consult     Precautions / Restrictions Precautions Precautions: Fall Restrictions Weight Bearing Restrictions: Yes RLE Weight Bearing: Weight bearing as tolerated      Mobility  Bed Mobility               General bed mobility comments: Pt with significant pain moving RLE at this time, bed mobility deferred  Transfers                    Ambulation/Gait                Stairs            Wheelchair Mobility    Modified Rankin (Stroke Patients Only)       Balance                                             Pertinent Vitals/Pain Pain Assessment: 0-10 Pain Score: 8  Pain Location: R anterior hip Pain Intervention(s): Limited activity within patient's tolerance;Monitored  during session;Repositioned;Premedicated before session    Home Living Family/patient expects to be discharged to:: Private residence Living Arrangements: Spouse/significant other Available Help at Discharge: Family Type of Home: House Home Access: Stairs to enter Entrance Stairs-Rails: Right Entrance Stairs-Number of Steps: 2-3 Home Layout: One level Home Equipment: Environmental consultant - 2 wheels      Prior Function Level of Independence: Needs assistance   Gait / Transfers Assistance Needed: Pt stated he needed assistance to ambulate with his walker  ADL's / Homemaking Assistance Needed: Reported he needs assistance with dressing, bathing, feeding, dependent for IADLs        Hand Dominance   Dominant Hand: Right    Extremity/Trunk Assessment   Upper Extremity Assessment Upper Extremity Assessment: Generalized weakness    Lower Extremity Assessment Lower Extremity Assessment: Generalized weakness       Communication   Communication: No difficulties  Cognition Arousal/Alertness: Lethargic Behavior During Therapy: Flat affect Overall Cognitive Status: No family/caregiver present to determine baseline cognitive functioning                                        General Comments  Exercises General Exercises - Lower Extremity Quad Sets: AROM;Both;5 reps Short Arc Quad: AAROM;Both;10 reps Heel Slides: AAROM;Both;10 reps Hip ABduction/ADduction: AAROM;Left;10 reps;PROM;Right Other Exercises Other Exercises: Pt with significant complaints of pain throughout exercises, irritability increased with mobility   Assessment/Plan    PT Assessment Patient needs continued PT services  PT Problem List Decreased strength;Pain;Decreased range of motion;Decreased activity tolerance;Decreased knowledge of use of DME;Decreased balance;Decreased safety awareness;Decreased mobility;Decreased knowledge of precautions       PT Treatment Interventions DME  instruction;Therapeutic exercise;Gait training;Balance training;Stair training;Neuromuscular re-education;Functional mobility training;Therapeutic activities;Patient/family education    PT Goals (Current goals can be found in the Care Plan section)  Acute Rehab PT Goals Patient Stated Goal: to decrease pain PT Goal Formulation: With patient Time For Goal Achievement: 05/31/18 Potential to Achieve Goals: Fair    Frequency BID   Barriers to discharge Inaccessible home environment      Co-evaluation               AM-PAC PT "6 Clicks" Mobility  Outcome Measure Help needed turning from your back to your side while in a flat bed without using bedrails?: Total Help needed moving from lying on your back to sitting on the side of a flat bed without using bedrails?: Total Help needed moving to and from a bed to a chair (including a wheelchair)?: Total Help needed standing up from a chair using your arms (e.g., wheelchair or bedside chair)?: Total Help needed to walk in hospital room?: Total Help needed climbing 3-5 steps with a railing? : Total 6 Click Score: 6    End of Session Equipment Utilized During Treatment: Gait belt Activity Tolerance: Patient limited by pain Patient left: in bed;with call bell/phone within reach;with bed alarm set;with SCD's reapplied Nurse Communication: Mobility status PT Visit Diagnosis: Other abnormalities of gait and mobility (R26.89);Muscle weakness (generalized) (M62.81);Pain;Difficulty in walking, not elsewhere classified (R26.2) Pain - Right/Left: Right Pain - part of body: Hip    Time: 8882-8003 PT Time Calculation (min) (ACUTE ONLY): 27 min   Charges:   PT Evaluation $PT Eval Low Complexity: 1 Low PT Treatments $Therapeutic Exercise: 8-22 mins        Lieutenant Diego PT, DPT 11:03 AM,05/17/18 [phone]

## 2018-05-17 NOTE — Progress Notes (Signed)
Subjective: 1 Day Post-Op Procedure(s) (LRB): CANNULATED HIP PINNING (Right) Patient reports pain as moderate.   Patient seen in rounds with Dr. Rudene Christians. Patient is well, and has had no acute complaints or problems Plan is to go Rehab after hospital stay. Negative for chest pain and shortness of breath Fever: no Gastrointestinal: Negative for nausea and vomiting. Positive hematuria on removal of Foley catheter.  Objective: Vital signs in last 24 hours: Temp:  [97.7 F (36.5 C)-100.6 F (38.1 C)] 99.1 F (37.3 C) (04/24 0426) Pulse Rate:  [31-104] 101 (04/24 0426) Resp:  [10-33] 20 (04/24 0426) BP: (93-152)/(48-87) 148/74 (04/24 0426) SpO2:  [77 %-99 %] 90 % (04/24 0426) Weight:  [88.9 kg-91.3 kg] 88.9 kg (04/23 1616)  Intake/Output from previous day:  Intake/Output Summary (Last 24 hours) at 05/17/2018 0736 Last data filed at 05/17/2018 0400 Gross per 24 hour  Intake 560.4 ml  Output 1822 ml  Net -1261.6 ml    Intake/Output this shift: No intake/output data recorded.  Labs: Recent Labs    05/15/18 1705 05/16/18 0255 05/16/18 1647 05/17/18 0539  HGB 11.8* 11.5* 10.9* 11.6*   Recent Labs    05/16/18 0255 05/16/18 1647 05/17/18 0539  WBC 7.7  --  9.5  RBC 4.05*  --  4.10*  HCT 35.2* 32.0* 35.4*  PLT 209  --  188   Recent Labs    05/16/18 0255 05/16/18 1647 05/17/18 0539  NA 136 138 138  K 4.6 4.1 4.4  CL 100  --  98  CO2 23  --  24  BUN 44*  --  40*  CREATININE 5.62*  --  5.30*  GLUCOSE 145* 123* 155*  CALCIUM 8.5*  --  8.1*   No results for input(s): LABPT, INR in the last 72 hours.   EXAM General - Patient is Alert and Oriented Extremity - Neurovascular intact Dorsiflexion/Plantar flexion intact Compartment soft Dressing/Incision - clean, dry, no drainage Motor Function - intact, moving foot and toes well on exam.   Past Medical History:  Diagnosis Date  . Acute metabolic encephalopathy 02/58/5277   Per my chart  . Arthritis   . Chronic  airway obstruction, not elsewhere classified    worked in [employer] and was exposed to [employer]  . 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 Holley Raring, nephrologist.  on dialysis tu - thur - sat  . Depressive disorder, not elsewhere classified   . Dialysis patient [facility]) 01/2018   dialysis tuesday - thursday - saturday  . Gout   . Gout   . Heart murmur    followed by dr. Clayborn Bigness  . Hyperlipidemia   . Hypertension   . Hypertrophy of prostate without urinary obstruction and other lower urinary tract symptoms (LUTS)   . Impotence of organic origin   . Microalbuminuria   . 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 Obesity, unspecified   . Plantar wart   . Sleep apnea    years ago but never used a cpap  . Synovitis and tenosynovitis, unspecified   . Type II or unspecified type diabetes mellitus without mention of complication, uncontrolled   . Unspecified venous (peripheral) insufficiency   . UTI (urinary tract infection)    frequent with ESBL     Assessment/Plan: 1 Day Post-Op Procedure(s) (LRB): CANNULATED HIP PINNING (Right) Active Problems:   Closed right hip fracture (HCC)  Estimated body mass index is 23.24 kg/m as calculated from the following:   Height  as of this encounter: 6\' 5"  (1.956 m).   Weight as of this encounter: 88.9 kg. Advance diet Up with therapy D/C IV fluids Discharge to SNF when cleared by medicine  DVT Prophylaxis - Lovenox, Foot Pumps and TED hose Weight-Bearing as tolerated to right leg  Reche Dixon, PA-C Orthopaedic Surgery 05/17/2018, 7:36 AM

## 2018-05-17 NOTE — Progress Notes (Signed)
[place] at [place] NAME: Joseph Newman    MR#:  [id number]  DATE OF BIRTH:  [birth year]  SUBJECTIVE:  CHIEF COMPLAINT:   Chief Complaint  Patient presents with  . Fall   No new complaint this morning.  Patient day 1 postop hip surgery.  No fever since yesterday.  Physical therapist at bedside working with patient.   REVIEW OF SYSTEMS:  Review of Systems  Constitutional: Negative for chills and fever.  HENT: Negative for hearing loss and tinnitus.   Eyes: Negative for blurred vision and double vision.  Respiratory: Negative for cough and hemoptysis.   Cardiovascular: Negative for chest pain and palpitations.  Gastrointestinal: Negative for heartburn and nausea.  Genitourinary: Negative for dysuria and urgency.  Musculoskeletal: Negative for myalgias and neck pain.  Skin: Negative for itching and rash.  Neurological: Negative for dizziness and headaches.  Psychiatric/Behavioral: Negative for depression and hallucinations.    DRUG ALLERGIES:  No Known Allergies VITALS:  Blood pressure 121/86, pulse 92, temperature 98.9 F (37.2 C), temperature source Oral, resp. rate 17, height 6\' 5"  (1.956 m), weight 88.9 kg, SpO2 93 %. PHYSICAL EXAMINATION:   Physical Exam  Constitutional: He is oriented to person, place, and time. He appears well-developed and well-nourished.  HENT:  Head: Normocephalic and atraumatic.  Right Ear: External ear normal.  Eyes: Pupils are equal, round, and reactive to light. Conjunctivae are normal. Right eye exhibits no discharge.  Neck: Normal range of motion. No thyromegaly present.  Cardiovascular: Normal rate, regular rhythm and normal heart sounds.  Respiratory: Effort normal and breath sounds normal.  GI: Soft. Bowel sounds are normal. He exhibits no distension.  Musculoskeletal: Normal range of motion.        General: No edema.  Neurological: He is alert and oriented to person, place, and time.  Skin:  Skin is warm. He is not diaphoretic. No erythema.   LABORATORY PANEL:  Male CBC Recent Labs  Lab 05/17/18 0539  WBC 9.5  HGB 11.6*  HCT 35.4*  PLT 188   ------------------------------------------------------------------------------------------------------------------ Chemistries  Recent Labs  Lab 05/15/18 1705  05/17/18 0539  NA 135   < > 138  K 5.2*   < > 4.4  CL 96*   < > 98  CO2 25   < > 24  GLUCOSE 146*   < > 155*  BUN 41*   < > 40*  CREATININE 5.23*   < > 5.30*  CALCIUM 8.4*   < > 8.1*  MG  --   --  1.9  AST 19  --   --   ALT 13  --   --   ALKPHOS 87  --   --   BILITOT 0.9  --   --    < > = values in this interval not displayed.   RADIOLOGY:  Dg Chest 1 View  Result Date: 05/16/2018 CLINICAL DATA:  73 year old male with a history of fever and hypertension EXAM: CHEST  1 VIEW COMPARISON:  05/15/2018, 01/31/2018 FINDINGS: Cardiomediastinal silhouette unchanged in size and contour. Similar appearance of reticulonodular opacities throughout the lungs, worsened from the plain film of 01/31/2018 and similar to 05/15/2018 No new confluent airspace disease.  No pleural effusion. IMPRESSION: Similar appearance of mixed interstitial and airspace disease, potentially edema or multifocal infection. No large pleural effusion or pneumothorax. Electronically Signed   By: Corrie Mckusick D.O.   On: 05/16/2018 15:10   Dg Hip Operative Unilat W  Or W/o Pelvis Right  Result Date: 05/16/2018 CLINICAL DATA:  Hip replacement EXAM: OPERATIVE right HIP (WITH PELVIS IF PERFORMED) 2 VIEWS TECHNIQUE: Fluoroscopic spot image(s) were submitted for interpretation post-operatively. COMPARISON:  05/15/2018 FINDINGS: Two low resolution intraoperative spot views of the right hip. Total fluoroscopy time was 1 minutes 12 seconds. The images demonstrate threaded screw fixation of right femoral neck fracture with normal alignment. IMPRESSION: Intraoperative fluoroscopic assistance provided during surgical  fixation of right hip fracture. Electronically Signed   By: Donavan Foil M.D.   On: 05/16/2018 18:29   ASSESSMENT AND PLAN:   Right hip fracture-s/p mechanical fall Patient already seen by orthopedic service on day 1 postop Pains appear controlled. Physical therapist at bedside evaluating patient.  Anticipate discharge to skilled nursing facility when medically ready   ESRD on HD TTS- chest x-ray with mild pulmonary edema.  Patient on 2 L O2. Had hemodialysis done yesterday.  Wean off oxygen as tolerated. Nephrology service following.  Fevers; likely due to probable pneumonia Temperature of 100.6 yesterday Patient has remained afebrile since yesterday.  Chest x-ray done yesterday with similar appearance with prior studies with findings consistent with mild pulmonary edema or multifocal infection.  No shortness of breath.  Patient already placed empirically on IV antibiotics with IV Zosyn pending blood culture results.  Hypertension-blood pressures well controlled -Continue home Coreg, olmesartan, amlodipine, HCTZ  Type 2 diabetes-blood sugars controlled  -SSI  Hyperlipidemia-stable -Continue home statin  BPH-stable -Continue home Flomax  DVT prophylaxis- patient started on Lovenox already.  Also has SCD in place.     I called and updated patient's wife over the phone on treatment plans.  She confirmed his CODE STATUS to be full code as well. All the records are reviewed and case discussed with Care Management/Social Worker. Management plans discussed with the patient, family and they are in agreement.  CODE STATUS: Full Code  TOTAL TIME TAKING CARE OF THIS PATIENT: 3 minutes.   More than 50% of the time was spent in counseling/coordination of care: YES  POSSIBLE D/C IN 1-2 DAYS, DEPENDING ON CLINICAL CONDITION.   Masud Holub M.D on 05/17/2018 at 10:35 AM  Between 7am to 6pm - Pager - [phone]  After 6pm go to [email or web address] - [facility]  Office  [phone]  CC: Primary care physician; Steele Sizer, MD  Note: This dictation was prepared with Dragon dictation along with smaller phrase technology. Any transcriptional errors that result from this process are unintentional.

## 2018-05-17 NOTE — TOC Initial Note (Signed)
Transition of Care [facility]) - Initial/Assessment Note    Patient Details  Name: Joseph Newman MRN: [id number] Date of Birth: [birth year]  Transition of Care [place]) CM/SW Contact:    Annamaria Boots, [place] Phone Number: 05/17/2018, 12:03 PM  Clinical Narrative: CSW consulted for SNF placement. CSW attempted to speak with patient but he is quiet and has a flat affect. He asked that CSW speak with wife. CSW contacted patient's wife Joseph Newman [phone]. Wife states that patient lives with her and was ambulatory prior to hospitalization. CSW explained PT recommendation of SNF. Wife states that she really does not feel comfortable sending patient to a facility due to the Covid Pandemic. CSW explained that patient can go home with home health but he may need more assistance than she can provide. Wife states that she will not send patient to SNF and that he should return home with home health. Wife states that they have used [facility] in the past and were very pleased with them. CSW contacted Cassie with [facility] and notified of referral. TOC team will continue to follow for discharge planning.                 Expected Discharge Plan: [place] Barriers to Discharge: Continued Medical Work up   Patient Goals and CMS Choice Patient states their goals for this hospitalization and ongoing recovery are:: Wife states that she wants patient to come home  CMS Medicare.gov Compare Post Acute Care list provided to:: Patient Represenative (must comment)(Wife) Choice offered to / list presented to : Spouse  Expected Discharge Plan and Services Expected Discharge Plan: [place] Choice: [place] arrangements for the past 2 months: [place] Agency: [facility] Date [place]: 05/17/18 Time [place]: 39 Representative spoke with at [place] Arrangements/Services Living arrangements for the past 2 months: [place] with:: Spouse Patient language and need for interpreter reviewed:: Yes Do you feel safe going back to the place where you live?: Yes      Need for Family Participation in Patient Care: Yes (Comment) Care giver support system in place?: Yes (comment)   Criminal Activity/Legal Involvement Pertinent to Current Situation/Hospitalization: No - Comment as needed  Activities of Daily Living Home Assistive Devices/Equipment: Eyeglasses, CBG Meter, Walker (specify type) ADL Screening (condition at time of admission) Patient's cognitive ability adequate to safely complete daily activities?: No Is the patient deaf or have difficulty hearing?: No Does the patient have difficulty seeing, even when wearing glasses/contacts?: No Does the patient have difficulty concentrating, remembering, or making decisions?: Yes Patient able to express need for assistance with ADLs?: Yes Does the patient have difficulty dressing or bathing?: Yes Independently performs ADLs?: No Communication: Independent Dressing (OT): Needs assistance Is this a change from baseline?: Pre-admission baseline Grooming: Needs assistance Is this a change from baseline?: Pre-admission baseline Feeding: Independent Bathing: Needs assistance Is this a change from baseline?: Pre-admission baseline Toileting: Needs assistance Is this a change from baseline?: Pre-admission baseline Walks in Home: Needs assistance Is this a change from baseline?: Pre-admission baseline Does the patient have difficulty walking or climbing stairs?: Yes Weakness of Legs: Both Weakness of Arms/Hands: None  Permission Sought/Granted Permission sought  to share information with : Case Manager, Customer service manager, Family Supports Permission granted to share information with : Yes, Verbal Permission Granted              Emotional  Assessment Appearance:: Appears stated age   Affect (typically observed): Quiet, Flat Orientation: : Oriented to Self Alcohol / Substance Use: Not Applicable Psych Involvement: No (comment)  Admission diagnosis:  ESRD (end stage renal disease) on dialysis ([place]) [N18.6, Z99.2] Acute respiratory failure with hypoxia (HCC) [J96.01] Closed fracture of right hip, initial encounter [facility]) [S72.001A] Patient Active Problem List   Diagnosis Date Noted  . Closed right hip fracture ([place]) 05/15/2018  . 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 End stage renal disease ([place]) 12/31/2017  . Chronic gouty arthritis 10/22/2017  . Hyperlipidemia associated with type 2 diabetes mellitus ([place]) 09/06/2017  . Chronic kidney disease (CKD), stage V ([place]) 07/26/2017  . Dyslipidemia associated with type 2 diabetes mellitus ([place]) 03/13/2016  . Diabetic ulcer of toe of right foot associated with type 2 diabetes mellitus, limited to breakdown of skin ([place]) 03/07/2016  . Type 2 diabetes mellitus with diabetic nephropathy, without long-term current use of insulin ([place]) 06/17/2015  . Microscopic hematuria 05/30/2015  . 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 Dyslipidemia 08/31/2014  . Diabetes mellitus with renal manifestation ([place]) 08/31/2014  . COPD (chronic obstructive pulmonary disease) ([place]) 08/31/2014  . Microalbuminuria 08/31/2014  . ED (erectile dysfunction) 08/31/2014  . Hypertension    PCP:  Steele Sizer, MD Pharmacy:   [facility] [place], [place] [address] [place] [address] Phone: [phone] Fax: [phone]  [id number] [place], [place] [address] [place] [address] Phone: [phone] Fax: [phone]     Social Determinants of Health (SDOH) Interventions    Readmission Risk Interventions No flowsheet data found.

## 2018-05-17 NOTE — Consult Note (Signed)
Pharmacy Antibiotic Note  Joseph Newman is a 73 y.o. male admitted on 05/15/2018 with Fevers/Hip Fracture.  Pharmacy has been consulted for Zosyn dosing. Patient receives Hemodialysis on Tuesday Thursday and Saturday  Plan: Will continue current plan: Zosyn 3.375g IV q12h (4 hour infusion).  Height: 6\' 5"  (195.6 cm) Weight: 195 lb 15.8 oz (88.9 kg) IBW/kg (Calculated) : 89.1  Temp (24hrs), Avg:98.8 F (37.1 C), Min:97.7 F (36.5 C), Max:100.4 F (38 C)  Recent Labs  Lab 05/15/18 1705 05/16/18 0255 05/17/18 0539  WBC 7.6 7.7 9.5  CREATININE 5.23* 5.62* 5.30*    Estimated Creatinine Clearance: 15.4 mL/min (A) (by C-G formula based on SCr of 5.3 mg/dL (H)).    No Known Allergies  Antimicrobials this admission: Zosyn 4/23 >>    Dose adjustments this admission:   Microbiology results: 4/23 BCx: pending   Thank you for allowing pharmacy to be a part of this patient's care.  Pearla Dubonnet, PharmD Clinical Pharmacist 05/17/2018 3:02 PM

## 2018-05-17 NOTE — Discharge Instructions (Signed)
INSTRUCTIONS AFTER Surgery  o Remove items at home which could result in a fall. This includes throw rugs or furniture in walking pathways o ICE to the affected joint every three hours while awake for 30 minutes at a time, for at least the first 3-5 days, and then as needed for pain and swelling.  Continue to use ice for pain and swelling. You may notice swelling that will progress down to the foot and ankle.  This is normal after surgery.  Elevate your leg when you are not up walking on it.   o Continue to use the breathing machine you got in the hospital (incentive spirometer) which will help keep your temperature down.  It is common for your temperature to cycle up and down following surgery, especially at night when you are not up moving around and exerting yourself.  The breathing machine keeps your lungs expanded and your temperature down.   DIET:  As you were doing prior to hospitalization, we recommend a well-balanced diet.  DRESSING / WOUND CARE / SHOWERING  Dressing changes needed.  The dressing is currently waterproof.  Staples will be removed at [facility] clinic in 2 weeks.  ACTIVITY  o Increase activity slowly as tolerated, but follow the weight bearing instructions below.   o No driving for 6 weeks or until further direction given by your physician.  You cannot drive while taking narcotics.  o No lifting or carrying greater than 10 lbs. until further directed by your surgeon. o Avoid periods of inactivity such as sitting longer than an hour when not asleep. This helps prevent blood clots.  o You may return to work once you are authorized by your doctor.     WEIGHT BEARING  Weightbearing as tolerated on the right   EXERCISES Ambulation and gait training with a walker.  Strengthening.  CONSTIPATION  Constipation is defined medically as fewer than three stools per week and severe constipation as less than one stool per week.  Even if you have a regular bowel pattern at home,  your normal regimen is likely to be disrupted due to multiple reasons following surgery.  Combination of anesthesia, postoperative narcotics, change in appetite and fluid intake all can affect your bowels.   YOU MUST use at least one of the following options; they are listed in order of increasing strength to get the job done.  They are all available over the counter, and you may need to use some, POSSIBLY even all of these options:    Drink plenty of fluids (prune juice may be helpful) and high fiber foods Colace 100 mg by mouth twice a day  Senokot for constipation as directed and as needed Dulcolax (bisacodyl), take with full glass of water  Miralax (polyethylene glycol) once or twice a day as needed.  If you have tried all these things and are unable to have a bowel movement in the first 3-4 days after surgery call either your surgeon or your primary doctor.    If you experience loose stools or diarrhea, hold the medications until you stool forms back up.  If your symptoms do not get better within 1 week or if they get worse, check with your doctor.  If you experience "the worst abdominal pain ever" or develop nausea or vomiting, please contact the office immediately for further recommendations for treatment.   ITCHING:  If you experience itching with your medications, try taking only a single pain pill, or even half a pain pill at  a time.  You can also use Benadryl over the counter for itching or also to help with sleep.   TED HOSE STOCKINGS:  Use stockings on both legs until for at least 2 weeks or as directed by physician office. They may be removed at night for sleeping.  MEDICATIONS:  See your medication summary on the After Visit Summary that nursing will review with you.  You may have some home medications which will be placed on hold until you complete the course of blood thinner medication.  It is important for you to complete the blood thinner medication as  prescribed.  PRECAUTIONS:  If you experience chest pain or shortness of breath - call 911 immediately for transfer to the hospital emergency department.   If you develop a fever greater that 101 F, purulent drainage from wound, increased redness or drainage from wound, foul odor from the wound/dressing, or calf pain - CONTACT YOUR SURGEON.                                                   FOLLOW-UP APPOINTMENTS:  If you do not already have a post-op appointment, please call the office for an appointment to be seen by your surgeon.  Guidelines for how soon to be seen are listed in your After Visit Summary, but are typically between 1-4 weeks after surgery.  OTHER INSTRUCTIONS:     MAKE SURE YOU:   Understand these instructions.   Get help right away if you are not doing well or get worse.    Thank you for letting us be a part of your medical care team.  It is a privilege we respect greatly.  We [place] these instructions will help you stay on track for a fast and full recovery!

## 2018-05-17 NOTE — Progress Notes (Signed)
Physical Therapy Treatment Patient Details Name: Joseph Newman MRN: [id number] DOB: [birth year] Today's Date: 05/17/2018    History of Present Illness Pt is 73 yo male s/p cannulated hip pinning after a fall at home. PMH of COPD, HTN, renal disease, HLD, DM    PT Comments    Patient more alert this session, denied hip pain at rest, did exhibit pain signs/symptoms with mobility. Bed mobility performed with total assist x2 with supine to sit, and returned to supine maxAx2. Patient needed total assist to scoot anteriorly towards edge of bed as well to place feet on the floor. Pt was able to progress from modA-CGA and unilateral UE support.  Sit<> stand attempted x2, total assistx2, unable to achieve full standing. Improved participation from PT second attempt, however still unable to achieve transfer. Pt returned to supine, all needs in reach at end of session. The patient would benefit from further skilled PT to continue to progress towards goals.    Follow Up Recommendations  SNF     Equipment Recommendations  Rolling walker with 5" wheels    Recommendations for Other Services OT consult     Precautions / Restrictions Precautions Precautions: Fall Restrictions Weight Bearing Restrictions: Yes RLE Weight Bearing: Weight bearing as tolerated    Mobility  Bed Mobility Overal bed mobility: Needs Assistance Bed Mobility: Supine to Sit;Sit to Supine     Supine to sit: +2 for physical assistance;Total assist Sit to supine: Max assist;+2 for physical assistance      Transfers Overall transfer level: Needs assistance Equipment used: Rolling walker (2 wheeled) Transfers: Sit to/from Stand Sit to Stand: Total assist;+2 physical assistance;From elevated surface         General transfer comment: total assistx2, unable to achieve full standing. Improved participation from PT second attempt, however still unable to achieve transfer.   Ambulation/Gait             General Gait  Details: deferred/unable at this time   Stairs             Wheelchair Mobility    Modified Rankin (Stroke Patients Only)       Balance Overall balance assessment: Needs assistance Sitting-balance support: Feet supported         Standing balance-Leahy Scale: Zero                              Cognition Arousal/Alertness: Awake/alert Behavior During Therapy: Flat affect Overall Cognitive Status: No family/caregiver present to determine baseline cognitive functioning                                        Exercises Other Exercises Other Exercises: Pt able to sit EOB progressing from modA to CGA, for several minutes, trunk flexed forward, pt preferred at least 1 UE support at all times    General Comments        Pertinent Vitals/Pain Pain Assessment: Faces Faces Pain Scale: Hurts whole lot Pain Location: returning to supine Pain Intervention(s): Limited activity within patient's tolerance;Monitored during session;Premedicated before session;Repositioned    Home Living                      Prior Function            PT Goals (current goals can now be found in the care plan section) Acute  Rehab PT Goals Patient Stated Goal: to decrease pain PT Goal Formulation: With patient Time For Goal Achievement: 05/31/18 Potential to Achieve Goals: Fair Progress towards PT goals: Progressing toward goals(slowly)    Frequency    BID      PT Plan Current plan remains appropriate    Co-evaluation              AM-PAC PT "6 Clicks" Mobility   Outcome Measure  Help needed turning from your back to your side while in a flat bed without using bedrails?: Total Help needed moving from lying on your back to sitting on the side of a flat bed without using bedrails?: Total Help needed moving to and from a bed to a chair (including a wheelchair)?: Total Help needed standing up from a chair using your arms (e.g., wheelchair or  bedside chair)?: Total Help needed to walk in hospital room?: Total Help needed climbing 3-5 steps with a railing? : Total 6 Click Score: 6    End of Session Equipment Utilized During Treatment: Oxygen;Gait belt(3L) Activity Tolerance: Patient limited by pain Patient left: in bed;with call bell/phone within reach;with bed alarm set;with SCD's reapplied Nurse Communication: Mobility status;Need for lift equipment PT Visit Diagnosis: Other abnormalities of gait and mobility (R26.89);Muscle weakness (generalized) (M62.81);Pain;Difficulty in walking, not elsewhere classified (R26.2) Pain - Right/Left: Right Pain - part of body: Hip     Time: 6754-4920 PT Time Calculation (min) (ACUTE ONLY): 24 min  Charges:  $Therapeutic Exercise: 8-22 mins $Therapeutic Activity: 23-37 mins                     Lieutenant Diego PT, DPT 3:03 PM,05/17/18 [phone]

## 2018-05-18 ENCOUNTER — Inpatient Hospital Stay: Payer: Medicare Other

## 2018-05-18 LAB — BASIC METABOLIC PANEL
Anion gap: 13 (ref 5–15)
BUN: 54 mg/dL — ABNORMAL HIGH (ref 8–23)
CO2: 25 mmol/L (ref 22–32)
Calcium: 8.2 mg/dL — ABNORMAL LOW (ref 8.9–10.3)
Chloride: 100 mmol/L (ref 98–111)
Creatinine, Ser: 6.18 mg/dL — ABNORMAL HIGH (ref 0.61–1.24)
GFR calc Af Amer: 9 mL/min — ABNORMAL LOW (ref >60)
GFR calc non Af Amer: 8 mL/min — ABNORMAL LOW (ref >60)
Glucose, Bld: 141 mg/dL — ABNORMAL HIGH (ref 70–99)
Potassium: 4.6 mmol/L (ref 3.5–5.1)
Sodium: 138 mmol/L (ref 135–145)

## 2018-05-18 LAB — CBC
HCT: 27 % — ABNORMAL LOW (ref 39.0–52.0)
Hemoglobin: 8.6 g/dL — ABNORMAL LOW (ref 13.0–17.0)
MCH: 28.1 pg (ref 26.0–34.0)
MCHC: 31.9 g/dL (ref 30.0–36.0)
MCV: 88.2 fL (ref 80.0–100.0)
Platelets: 190 10*3/uL (ref 150–400)
RBC: 3.06 MIL/uL — ABNORMAL LOW (ref 4.22–5.81)
RDW: 15 % (ref 11.5–15.5)
WBC: 12.1 10*3/uL — ABNORMAL HIGH (ref 4.0–10.5)
nRBC: 0 % (ref 0.0–0.2)

## 2018-05-18 LAB — MAGNESIUM: Magnesium: 2 mg/dL (ref 1.7–2.4)

## 2018-05-18 LAB — GLUCOSE, CAPILLARY
Glucose-Capillary: 137 mg/dL — ABNORMAL HIGH (ref 70–99)
Glucose-Capillary: 110 mg/dL — ABNORMAL HIGH (ref 70–99)
Glucose-Capillary: 155 mg/dL — ABNORMAL HIGH (ref 70–99)
Glucose-Capillary: 174 mg/dL — ABNORMAL HIGH (ref 70–99)

## 2018-05-18 LAB — PHOSPHORUS: Phosphorus: 6.3 mg/dL — ABNORMAL HIGH (ref 2.5–4.6)

## 2018-05-18 MED ORDER — CALCIUM ACETATE (PHOS BINDER) 667 MG PO CAPS
1334.0000 mg | ORAL_CAPSULE | Freq: Three times a day (TID) | ORAL | Status: DC
  Administered 2018-05-18 – 2018-05-24 (×19): 1334 mg via ORAL
  Filled 2018-05-18 (×21): qty 2

## 2018-05-18 MED ORDER — HEPARIN SODIUM (PORCINE) 5000 UNIT/ML IJ SOLN
5000.0000 [IU] | Freq: Three times a day (TID) | INTRAMUSCULAR | Status: DC
  Administered 2018-05-19 – 2018-05-23 (×15): 5000 [IU] via SUBCUTANEOUS
  Filled 2018-05-18 (×16): qty 1

## 2018-05-18 MED ORDER — EPOETIN ALFA 10000 UNIT/ML IJ SOLN
4000.0000 [IU] | INTRAMUSCULAR | Status: DC
  Administered 2018-05-21: 4000 [IU] via INTRAVENOUS
  Filled 2018-05-18 (×3): qty 1

## 2018-05-18 NOTE — Plan of Care (Signed)

## 2018-05-18 NOTE — Progress Notes (Addendum)
Subjective: 2 Days Post-Op Procedure(s) (LRB): CANNULATED HIP PINNING (Right) Patient reports pain as moderate.   Patient seen in rounds with Dr. Roland Rack. Patient is well, and has had no acute complaints or problems Plan is to go home with home health physical therapy after hospital stay. Negative for chest pain and shortness of breath Fever: no Gastrointestinal: Negative for nausea and vomiting. Positive hematuria on removal of Foley catheter.  Objective: Vital signs in last 24 hours: Temp:  [98.6 F (37 C)-98.9 F (37.2 C)] 98.7 F (37.1 C) (04/25 0434) Pulse Rate:  [62-95] 62 (04/25 0434) Resp:  [17-22] 20 (04/25 0434) BP: (101-137)/(48-86) 122/59 (04/25 0434) SpO2:  [93 %-94 %] 93 % (04/25 0434)  Intake/Output from previous day:  Intake/Output Summary (Last 24 hours) at 05/18/2018 0712 Last data filed at 05/17/2018 1514 Gross per 24 hour  Intake 419.73 ml  Output -  Net 419.73 ml    Intake/Output this shift: No intake/output data recorded.  Labs: Recent Labs    05/15/18 1705 05/16/18 0255 05/16/18 1647 05/17/18 0539 05/18/18 0449  HGB 11.8* 11.5* 10.9* 11.6* 8.6*   Recent Labs    05/17/18 0539 05/18/18 0449  WBC 9.5 12.1*  RBC 4.10* 3.06*  HCT 35.4* 27.0*  PLT 188 190   Recent Labs    05/17/18 0539 05/18/18 0449  NA 138 138  K 4.4 4.6  CL 98 100  CO2 24 25  BUN 40* 54*  CREATININE 5.30* 6.18*  GLUCOSE 155* 141*  CALCIUM 8.1* 8.2*   No results for input(s): LABPT, INR in the last 72 hours.   EXAM General - Patient is Alert and Oriented Extremity - Neurovascular intact Dorsiflexion/Plantar flexion intact Compartment soft Dressing/Incision - clean, dry, no drainage Motor Function - intact, moving foot and toes well on exam.  Limited walking with physical therapy  Past Medical History:  Diagnosis Date  . Acute metabolic encephalopathy 69/62/9528   Per my chart  . Arthritis   . Chronic airway obstruction, not elsewhere classified    worked in [employer] and was exposed to [employer]  . 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 Holley Raring, nephrologist.  on dialysis tu - thur - sat  . Depressive disorder, not elsewhere classified   . Dialysis patient [facility]) 01/2018   dialysis tuesday - thursday - saturday  . Gout   . Gout   . Heart murmur    followed by dr. Clayborn Bigness  . Hyperlipidemia   . Hypertension   . Hypertrophy of prostate without urinary obstruction and other lower urinary tract symptoms (LUTS)   . Impotence of organic origin   . Microalbuminuria   . 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 Obesity, unspecified   . Plantar wart   . Sleep apnea    years ago but never used a cpap  . Synovitis and tenosynovitis, unspecified   . Type II or unspecified type diabetes mellitus without mention of complication, uncontrolled   . Unspecified venous (peripheral) insufficiency   . UTI (urinary tract infection)    frequent with ESBL     Assessment/Plan: 2 Days Post-Op Procedure(s) (LRB): CANNULATED HIP PINNING (Right) Active Problems:   Closed right hip fracture (HCC)  Estimated body mass index is 23.24 kg/m as calculated from the following:   Height as of this encounter: 6\' 5"  (1.956 m).   Weight as of this encounter: 88.9 kg. Advance diet Up with therapy D/C IV fluids Discharge to SNF when cleared  by medicine Follow-up at [facility] clinic in 2 weeks for staple removal.  DVT Prophylaxis - Lovenox, Foot Pumps and TED hose Weight-Bearing as tolerated to right leg  Reche Dixon, PA-C Orthopaedic Surgery 05/18/2018, 7:12 AM

## 2018-05-18 NOTE — Progress Notes (Signed)
Pre HD Assessment    05/18/18 0820  Neurological  Level of Consciousness Alert  Orientation Level Oriented to person;Oriented to place  Respiratory  Respiratory Pattern Regular;Unlabored  Chest Assessment Chest expansion symmetrical  Bilateral Breath Sounds Clear;Diminished  Cough None  Cardiac  Pulse Regular  Heart Sounds S1, S2  ECG Monitor Yes  Cardiac Rhythm NSR  Vascular  R Radial Pulse +2  L Radial Pulse +2  Psychosocial  Psychosocial (WDL) WDL  Patient Behaviors Calm;Cooperative  Emotional support given Given to patient

## 2018-05-18 NOTE — Progress Notes (Signed)
Post HD [facility]    05/18/18 1130  Hand-Off documentation  Report given to (Full Name) Helen Hashimoto, RN   Report received from (Full Name) Beatris Ship, RN   Vital Signs  Temp 98.3 F (36.8 C)  Temp Source Oral  Pulse Rate 78  Pulse Rate Source Monitor  Resp (!) 21  BP 120/65  BP Location Left Arm  BP Method Automatic  Patient Position (if appropriate) Lying  Oxygen Therapy  SpO2 100 %  O2 Device Nasal Cannula  O2 Flow Rate (L/min) 2 L/min  Pain Assessment  Pain Scale 0-10  Pain Score 0  Dialysis Weight  Weight 89.8 kg  Type of Weight Post-Dialysis  Post-Hemodialysis Assessment  Rinseback Volume (mL) 250 mL  KECN 64.8 V  Dialyzer Clearance Lightly streaked  Duration of HD Treatment -hour(s) 3 hour(s)  Hemodialysis Intake (mL) 500 mL  UF Total -Machine (mL) 2007 mL  Net UF (mL) 1507 mL  Tolerated HD Treatment Yes  AVG/AVF Arterial Site Held (minutes) 5 minutes  AVG/AVF Venous Site Held (minutes) 5 minutes  Fistula / Graft Right Upper arm Arteriovenous fistula  Placement Date: 05/15/18   Placed prior to admission: Yes  Orientation: Right  Access Location: Upper arm  Access Type: Arteriovenous fistula  Site Condition No complications  Fistula / Graft Assessment Present;Thrill;Bruit  Drainage Description None

## 2018-05-18 NOTE — Progress Notes (Signed)
[place], [place] 05/18/18  Subjective:   Patient known to our practice from outpatient dialysis.  He presented via EMS from home after a fall on April 22.  Per EMS notes, patient missed the chair and fell straight back.  Patient Underwent right hip pinning by Dr. Rudene Christians on 4/23  Patient seen during dialysis Tolerating well    HEMODIALYSIS FLOWSHEET:  Blood Flow Rate (mL/min): 350 mL/min Arterial Pressure (mmHg): -170 mmHg Venous Pressure (mmHg): 210 mmHg Transmembrane Pressure (mmHg): 50 mmHg Ultrafiltration Rate (mL/min): 800 mL/min Dialysate Flow Rate (mL/min): 800 ml/min Conductivity: Machine : 144 Conductivity: Machine : 144 Dialysis Fluid Bolus: Normal Saline Bolus Amount (mL): 250 mL  Intermittent confusion on the floor reported by nursing staff  Objective:  Vital signs in last 24 hours:  Temp:  [98.6 F (37 C)-98.7 F (37.1 C)] 98.6 F (37 C) (04/25 0825) Pulse Rate:  [62-92] 78 (04/25 0915) Resp:  [17-22] 17 (04/25 0915) BP: (101-125)/(48-98) 119/64 (04/25 0915) SpO2:  [93 %-94 %] 93 % (04/25 0434)  Weight change:  Filed Weights   05/16/18 0921 05/16/18 1230 05/16/18 1616  Weight: 91.3 kg 88.9 kg 88.9 kg    Intake/Output:    Intake/Output Summary (Last 24 hours) at 05/18/2018 0921 Last data filed at 05/18/2018 0715 Gross per 24 hour  Intake 1040.53 ml  Output -  Net 1040.53 ml     Physical Exam: General:  Chronically ill-appearing, laying in the bed  HEENT  anicteric, moist oral mucous membranes  Neck  supple  Pulm/lungs  normal breathing effort, clear anteriorly and laterally  CVS/Heart  irregular, no rub  Abdomen:   Soft, nontender  Extremities:  Trace peripheral edema  Neurologic:  resting quietly  Skin:  No acute rashes  Access:  Right arm AV fistula       Basic Metabolic Panel:  Recent Labs  Lab 05/15/18 1705 05/16/18 0255 05/16/18 1647 05/17/18 0539 05/18/18 0449  NA 135 136 138 138 138  K 5.2* 4.6  4.1 4.4 4.6  CL 96* 100  --  98 100  CO2 25 23  --  24 25  GLUCOSE 146* 145* 123* 155* 141*  BUN 41* 44*  --  40* 54*  CREATININE 5.23* 5.62*  --  5.30* 6.18*  CALCIUM 8.4* 8.5*  --  8.1* 8.2*  MG  --   --   --  1.9 2.0  PHOS  --   --   --  5.2* 6.3*     CBC: Recent Labs  Lab 05/15/18 1705 05/16/18 0255 05/16/18 1647 05/17/18 0539 05/18/18 0449  WBC 7.6 7.7  --  9.5 12.1*  NEUTROABS 5.2  --   --   --   --   HGB 11.8* 11.5* 10.9* 11.6* 8.6*  HCT 35.9* 35.2* 32.0* 35.4* 27.0*  MCV 86.3 86.9  --  86.3 88.2  PLT 209 209  --  188 190      Lab Results  Component Value Date   HEPBSAG Negative 09/27/2017      Microbiology:  Recent Results (from the past 240 hour(s))  Surgical PCR screen     Status: Abnormal   Collection Time: 05/15/18 10:05 PM  Result Value Ref Range Status   MRSA, PCR POSITIVE (A) NEGATIVE Final    Comment: RESULT CALLED TO, READ BACK BY AND VERIFIED WITH: Kathyrn Drown RN [id number] 05/15/2018 HNM    Staphylococcus aureus POSITIVE (A) NEGATIVE Final    Comment: (NOTE) The Xpert SA Assay (  FDA approved for NASAL specimens in patients 25 years of age and older), is one component of a comprehensive surveillance program. It is not intended to diagnose infection nor to guide or monitor treatment. Performed at [facility], [address] [place], [place] [address]   CULTURE, BLOOD (ROUTINE X 2) w Reflex to ID Panel     Status: None (Preliminary result)   Collection Time: 05/16/18 10:22 AM  Result Value Ref Range Status   Specimen Description BLOOD LEFT ARM  Final   Special Requests   Final    BOTTLES DRAWN AEROBIC AND ANAEROBIC Blood Culture adequate volume   Culture   Final    NO GROWTH 2 DAYS Performed at [facility], [address], [place], [place] [address]    Report Status PENDING  Incomplete  CULTURE, BLOOD (ROUTINE X 2) w Reflex to ID Panel     Status: None (Preliminary result)   Collection Time: 05/16/18 10:29 AM   Result Value Ref Range Status   Specimen Description BLOOD LEFT ANTECUBITAL  Final   Special Requests   Final    BOTTLES DRAWN AEROBIC AND ANAEROBIC Blood Culture adequate volume   Culture   Final    NO GROWTH 2 DAYS Performed at [facility], [address] [place], [place] [address]    Report Status PENDING  Incomplete    Coagulation Studies: No results for input(s): LABPROT, INR in the last 72 hours.  Urinalysis: Recent Labs    05/16/18 0417  COLORURINE GROSSLY BLOODY*  LABSPEC 1.012  PHURINE TEST NOT REPORTED DUE TO COLOR INTERFERENCE OF URINE PIGMENT  GLUCOSEU TEST NOT REPORTED DUE TO COLOR INTERFERENCE OF URINE PIGMENT*  HGBUR TEST NOT REPORTED DUE TO COLOR INTERFERENCE OF URINE PIGMENT*  BILIRUBINUR TEST NOT REPORTED DUE TO COLOR INTERFERENCE OF URINE PIGMENT*  KETONESUR TEST NOT REPORTED DUE TO COLOR INTERFERENCE OF URINE PIGMENT*  PROTEINUR TEST NOT REPORTED DUE TO COLOR INTERFERENCE OF URINE PIGMENT*  NITRITE TEST NOT REPORTED DUE TO COLOR INTERFERENCE OF URINE PIGMENT*  LEUKOCYTESUR TEST NOT REPORTED DUE TO COLOR INTERFERENCE OF URINE PIGMENT*      Imaging: Dg Chest 1 View  Result Date: 05/16/2018 CLINICAL DATA:  73 year old male with a history of fever and hypertension EXAM: CHEST  1 VIEW COMPARISON:  05/15/2018, 01/31/2018 FINDINGS: Cardiomediastinal silhouette unchanged in size and contour. Similar appearance of reticulonodular opacities throughout the lungs, worsened from the plain film of 01/31/2018 and similar to 05/15/2018 No new confluent airspace disease.  No pleural effusion. IMPRESSION: Similar appearance of mixed interstitial and airspace disease, potentially edema or multifocal infection. No large pleural effusion or pneumothorax. Electronically Signed   By: Corrie Mckusick D.O.   On: 05/16/2018 15:10   Dg Hip Operative Unilat W Or W/o Pelvis Right  Result Date: 05/16/2018 CLINICAL DATA:  Hip replacement EXAM: OPERATIVE right HIP (WITH PELVIS  IF PERFORMED) 2 VIEWS TECHNIQUE: Fluoroscopic spot image(s) were submitted for interpretation post-operatively. COMPARISON:  05/15/2018 FINDINGS: Two low resolution intraoperative spot views of the right hip. Total fluoroscopy time was 1 minutes 12 seconds. The images demonstrate threaded screw fixation of right femoral neck fracture with normal alignment. IMPRESSION: Intraoperative fluoroscopic assistance provided during surgical fixation of right hip fracture. Electronically Signed   By: Donavan Foil M.D.   On: 05/16/2018 18:29     Medications:   . sodium chloride 50 mL/hr at 05/18/18 0715  . piperacillin-tazobactam (ZOSYN)  IV Stopped (05/18/18 0128)   . allopurinol  50 mg  Oral QODAY  . irbesartan  300 mg Oral Daily   And  . amLODipine  10 mg Oral Daily   And  . hydrochlorothiazide  25 mg Oral Daily  . aspirin EC  81 mg Oral Daily  . atorvastatin  40 mg Oral Daily  . carvedilol  6.25 mg Oral BID WC  . Chlorhexidine Gluconate Cloth  6 each Topical Q0600  . docusate sodium  100 mg Oral BID  . enoxaparin (LOVENOX) injection  30 mg Subcutaneous Q24H  . insulin aspart  0-5 Units Subcutaneous QHS  . insulin aspart  0-9 Units Subcutaneous TID WC  . mupirocin ointment  1 application Nasal BID  . tamsulosin  0.4 mg Oral QPC breakfast   acetaminophen **OR** acetaminophen, alum & mag hydroxide-simeth, bisacodyl, magnesium citrate, magnesium hydroxide, menthol-cetylpyridinium **OR** phenol, morphine injection, ondansetron **OR** ondansetron (ZOFRAN) IV, oxyCODONE, polyethylene glycol, traMADol  Assessment/ Plan:  73 y.o. African-American male with end-stage renal disease, depression, gout, BPH, history of urinary retention, followed by urologist Dr. Bernardo Heater, history of colonization of urine  Davita Graham/TTS/103.5 kg/Rt arm AVF/  1.  End-stage renal disease -Arrange for hemodialysis on TTS schedule while inpatient - small length needles to be used for HD Patient seen during  dialysis Tolerating well   2.  Anemia of chronic kidney disease -Hemoglobin 8.6.   - Epogen with HD treatment  3.  Secondary hyperparathyroidism - Phos 6.3 - start PhosLo  4.  Right hip fracture Underwent right hip pinning on 4/23   LOS: 3 Dru Primeau 4/25/20209:21 AM  Robert Lee, [place]  Note: This note was prepared with Dragon dictation. Any transcription errors are unintentional

## 2018-05-18 NOTE — Progress Notes (Signed)
PT Cancellation Note  Patient Details Name: Joseph Newman MRN: [id number] DOB: 06/06/1945   Cancelled Treatment:    Reason Eval/Treat Not Completed: Patient at procedure or test/unavailable(not available). Patient not in room/available. Will attempt again at later time/date.   Janna Arch, PT, DPT   05/18/2018, 8:40 AM

## 2018-05-18 NOTE — Progress Notes (Signed)
OT Cancellation Note  Patient Details Name: Joseph Newman MRN: [id number] DOB: 1945-08-05   Cancelled Treatment:    Reason Eval/Treat Not Completed: Patient at procedure or test/ unavailable. Consult received, chart reviewed. Pt out of room for dialysis. Will re-attempt OT evaluation at later date/time as pt is available.  Jeni Salles, MPH, MS, OTR/L ascom [phone] 05/18/18, 8:52 AM

## 2018-05-18 NOTE — Progress Notes (Signed)
HD Tx started w/o complication    47/65/46 0828  Vital Signs  Pulse Rate 80  Pulse Rate Source Monitor  Resp 16  BP 121/65  BP Location Left Arm  BP Method Automatic  Patient Position (if appropriate) Lying  Oxygen Therapy  SpO2 100 %  O2 Device Nasal Cannula  O2 Flow Rate (L/min) 2 L/min  Pulse Oximetry Type Continuous  During Hemodialysis Assessment  Blood Flow Rate (mL/min) 350 mL/min  Arterial Pressure (mmHg) -160 mmHg  Venous Pressure (mmHg) 160 mmHg  Transmembrane Pressure (mmHg) 60 mmHg  Ultrafiltration Rate (mL/min) 730 mL/min  Dialysate Flow Rate (mL/min) 800 ml/min  Conductivity: Machine  14  HD Safety Checks Performed Yes  Dialysis Fluid Bolus Normal Saline  Bolus Amount (mL) 250 mL  Intra-Hemodialysis Comments Tx initiated  Fistula / Graft Right Upper arm Arteriovenous fistula  Placement Date: 05/15/18   Placed prior to admission: Yes  Orientation: Right  Access Location: Upper arm  Access Type: Arteriovenous fistula  Status Accessed  Needle Size 16g

## 2018-05-18 NOTE — Progress Notes (Signed)
PT Cancellation Note  Patient Details Name: Joseph Newman MRN: [id number] DOB: [birth year]   Cancelled Treatment:    Reason Eval/Treat Not Completed: Patient at procedure or test/unavailable Patient refused d/t being so tired from dialysis  Shelton Silvas PT, DPT Shelton Silvas 05/18/2018, 3:16 PM

## 2018-05-18 NOTE — Progress Notes (Signed)
Post HD Assessment    05/18/18 1137  Neurological  Level of Consciousness Alert  Orientation Level Oriented to person;Oriented to place  Respiratory  Respiratory Pattern Regular  Chest Assessment Chest expansion symmetrical  Bilateral Breath Sounds Clear;Diminished  Cough None  Cardiac  Pulse Regular  Heart Sounds S1, S2  ECG Monitor Yes  Cardiac Rhythm NSR  Vascular  R Radial Pulse +2  L Radial Pulse +2  Psychosocial  Psychosocial (WDL) WDL  Patient Behaviors Calm;Cooperative  Emotional support given Given to patient

## 2018-05-18 NOTE — Progress Notes (Signed)
[place] at [place] NAME: Joseph Newman    MR#:  [id number]  DATE OF BIRTH:  Oct 06, 1945  SUBJECTIVE:  CHIEF COMPLAINT:   Chief Complaint  Patient presents with  . Fall   No new complaint this morning.  Worked with physical therapy.  Complain of pain with movements.  REVIEW OF SYSTEMS:  Review of Systems  Constitutional: Negative for chills and fever.  HENT: Negative for hearing loss and tinnitus.   Eyes: Negative for blurred vision and double vision.  Respiratory: Negative for cough and hemoptysis.   Cardiovascular: Negative for chest pain and palpitations.  Gastrointestinal: Negative for heartburn and nausea.  Genitourinary: Negative for dysuria and urgency.  Musculoskeletal: Negative for myalgias and neck pain.  Skin: Negative for itching and rash.  Neurological: Negative for dizziness and headaches.  Psychiatric/Behavioral: Negative for depression and hallucinations.    DRUG ALLERGIES:  No Known Allergies VITALS:  Blood pressure 128/74, pulse 85, temperature 98.4 F (36.9 C), temperature source Oral, resp. rate (!) 24, height 6\' 5"  (1.956 m), weight 91.2 kg, SpO2 98 %. PHYSICAL EXAMINATION:   Physical Exam  Constitutional: He is oriented to person, place, and time. He appears well-developed and well-nourished.  HENT:  Head: Normocephalic and atraumatic.  Right Ear: External ear normal.  Eyes: Pupils are equal, round, and reactive to light. Conjunctivae are normal. Right eye exhibits no discharge.  Neck: Normal range of motion. No thyromegaly present.  Cardiovascular: Normal rate, regular rhythm and normal heart sounds.  Respiratory: Effort normal and breath sounds normal.  GI: Soft. Bowel sounds are normal. He exhibits no distension.  Musculoskeletal: Normal range of motion.        General: No edema.  Neurological: He is alert and oriented to person, place, and time.  Skin: Skin is warm. He is not diaphoretic. No erythema.    LABORATORY PANEL:  Male CBC Recent Labs  Lab 05/18/18 0449  WBC 12.1*  HGB 8.6*  HCT 27.0*  PLT 190   ------------------------------------------------------------------------------------------------------------------ Chemistries  Recent Labs  Lab 05/15/18 1705  05/18/18 0449  NA 135   < > 138  K 5.2*   < > 4.6  CL 96*   < > 100  CO2 25   < > 25  GLUCOSE 146*   < > 141*  BUN 41*   < > 54*  CREATININE 5.23*   < > 6.18*  CALCIUM 8.4*   < > 8.2*  MG  --    < > 2.0  AST 19  --   --   ALT 13  --   --   ALKPHOS 87  --   --   BILITOT 0.9  --   --    < > = values in this interval not displayed.   RADIOLOGY:  No results found. ASSESSMENT AND PLAN:   Right hip fracture-s/p mechanical fall Patient already seen by orthopedic service on day 2 postop Pains appear controlled. Physical therapist at bedside evaluating patient.  Anticipate discharge to skilled nursing facility after the weekend.  ESRD on HD TTS- chest x-ray with mild pulmonary edema.  Patient on 2 L O2.  Wean off oxygen as tolerated. Nephrology service following.  Fevers;  Temperature of 100.6 postoperatively. Patient has remained afebrile since 2 days.  Chest x-ray done  with similar appearance with prior studies with findings consistent with mild pulmonary edema or multifocal infection.  No shortness of breath.  Patient was placed empirically on  IV antibiotics with IV Zosyn pending blood culture results. Pneumonia was suspected but ruled out.  Patient does not have symptoms. Stop IV antibiotics.  Blood cultures are negative.  Hypertension-blood pressures well controlled -Continue home Coreg, olmesartan, amlodipine, HCTZ  Type 2 diabetes-blood sugars controlled  -SSI  Hyperlipidemia-stable -Continue home statin  BPH-stable -Continue home Flomax  DVT prophylaxis- patient started on Lovenox already.  Also has SCD in place.     All the records are reviewed and case discussed with Care  Management/Social Worker. Management plans discussed with the patient, family and they are in agreement.  CODE STATUS: Full Code  TOTAL TIME TAKING CARE OF THIS PATIENT: 35 minutes.   More than 50% of the time was spent in counseling/coordination of care: YES  POSSIBLE D/C IN 1-2 DAYS, DEPENDING ON CLINICAL CONDITION.   Vaughan Basta M.D on 05/18/2018 at 4:08 PM  Between 7am to 6pm - Pager - [phone]  After 6pm go to [email or web address] - Technical brewer [facility]  Office  [phone]  CC: Primary care physician; Steele Sizer, MD  Note: This dictation was prepared with Dragon dictation along with smaller phrase technology. Any transcriptional errors that result from this process are unintentional.

## 2018-05-18 NOTE — Progress Notes (Signed)
Pre HD Tx   05/18/18 0825  Hand-Off documentation  Report given to (Full Name) Beatris Ship, RN   Report received from (Full Name) Helen Hashimoto, RN   Vital Signs  Temp 98.6 F (37 C)  Temp Source Oral  Pulse Rate 80  Pulse Rate Source Monitor  Resp 16  BP (!) 111/98  BP Location Left Arm  BP Method Automatic  Patient Position (if appropriate) Lying  Oxygen Therapy  SpO2 100 %  O2 Device Nasal Cannula  O2 Flow Rate (L/min) 2 L/min  Pulse Oximetry Type Continuous  Pain Assessment  Pain Scale 0-10  Pain Score 0  Dialysis Weight  Weight 91.2 kg  Type of Weight Pre-Dialysis  Time-Out for Hemodialysis  What Procedure? HD  Pt Identifiers(min of two) First/Last Name;MRN/Account#  Correct Site? Yes  Correct Side? Yes  Correct Procedure? Yes  Consents Verified? Yes  Rad Studies Available? N/A  Safety Precautions Reviewed? Yes  Engineer, civil (consulting) Number 1  Station Number 3  UF/Alarm Test Passed  Conductivity: Meter 13.8  Conductivity: Machine  13.7  pH 7.2  Reverse Osmosis Main  Normal Saline Lot Number [id number]  Dialyzer Lot Number [id number]  Disposable Set Lot Number [id number]  Machine Temperature 98.6 F (37 C)  Musician and Audible Yes  Blood Lines Intact and Secured Yes  Pre Treatment Patient Checks  Vascular access used during treatment Fistula  Hepatitis B Surface Antigen Results Negative  Date Hepatitis B Surface Antigen Drawn 04/25/18  Hepatitis B Surface Antibody 3  Date Hepatitis B Surface Antibody Drawn 10/01/17  Hemodialysis Consent Verified Yes  Hemodialysis Standing Orders Initiated Yes  ECG (Telemetry) Monitor On Yes  Prime Ordered Normal Saline  Length of  DialysisTreatment -hour(s) 3 Hour(s)  Dialysis Treatment Comments na 140  Dialyzer Elisio 17H NR  Dialysate 3K, 2.5 Ca  Dialysis Anticoagulant None  Dialysate Flow Ordered 800  Blood Flow Rate Ordered 350 mL/min (Pt has 16g needles, Max bfr 350)  Ultrafiltration Goal 1.5 Liters   Dialysis Blood Pressure Support Ordered Normal Saline  Education / Care Plan  Dialysis Education Provided Yes  Documented Education in Care Plan Yes  Fistula / Graft Right Upper arm Arteriovenous fistula  Placement Date: 05/15/18   Placed prior to admission: Yes  Orientation: Right  Access Location: Upper arm  Access Type: Arteriovenous fistula  Site Condition No complications  Fistula / Graft Assessment Present;Thrill;Bruit  Status Patent  Drainage Description None

## 2018-05-18 NOTE — Progress Notes (Signed)
Patient transported to dialysis by Corene Cornea with transport.

## 2018-05-18 NOTE — Progress Notes (Addendum)
Patient needing more O2. On 4L now. MD paged, orders placed for chest xray and d/c fluids.

## 2018-05-18 NOTE — Progress Notes (Signed)
PT Cancellation Note  Patient Details Name: Joseph Newman MRN: [id number] DOB: 26-Nov-1945   Cancelled Treatment:    Reason Eval/Treat Not Completed: Patient at procedure or test/unavailable(patient being fed lunch). Patient being fed by CNA upon PT arrival. Will attempt again at later time/date  Janna Arch, PT, DPT   05/18/2018, 1:29 PM

## 2018-05-18 NOTE — Progress Notes (Signed)
HD Tx completed, tolerated well, UF goal met   05/18/18 1127  Vital Signs  Pulse Rate 73  Pulse Rate Source Monitor  Resp 14  BP 119/60  BP Location Left Arm  BP Method Automatic  Patient Position (if appropriate) Lying  Oxygen Therapy  SpO2 100 %  O2 Device Nasal Cannula  O2 Flow Rate (L/min) 2 L/min  During Hemodialysis Assessment  HD Safety Checks Performed Yes  KECN 64.8 KECN  Dialysis Fluid Bolus Normal Saline  Bolus Amount (mL) 250 mL  Intra-Hemodialysis Comments Tx completed;Tolerated well  Fistula / Graft Right Upper arm Arteriovenous fistula  Placement Date: 05/15/18   Placed prior to admission: Yes  Orientation: Right  Access Location: Upper arm  Access Type: Arteriovenous fistula  Status Deaccessed

## 2018-05-19 LAB — CBC
HCT: 30.9 % — ABNORMAL LOW (ref 39.0–52.0)
Hemoglobin: 9.9 g/dL — ABNORMAL LOW (ref 13.0–17.0)
MCH: 28.4 pg (ref 26.0–34.0)
MCHC: 32 g/dL (ref 30.0–36.0)
MCV: 88.5 fL (ref 80.0–100.0)
Platelets: 167 10*3/uL (ref 150–400)
RBC: 3.49 MIL/uL — ABNORMAL LOW (ref 4.22–5.81)
RDW: 15 % (ref 11.5–15.5)
WBC: 8.8 10*3/uL (ref 4.0–10.5)
nRBC: 0 % (ref 0.0–0.2)

## 2018-05-19 LAB — PROCALCITONIN: Procalcitonin: 1.41 ng/mL

## 2018-05-19 LAB — GLUCOSE, CAPILLARY
Glucose-Capillary: 141 mg/dL — ABNORMAL HIGH (ref 70–99)
Glucose-Capillary: 170 mg/dL — ABNORMAL HIGH (ref 70–99)
Glucose-Capillary: 119 mg/dL — ABNORMAL HIGH (ref 70–99)
Glucose-Capillary: 176 mg/dL — ABNORMAL HIGH (ref 70–99)

## 2018-05-19 MED ORDER — AMLODIPINE BESYLATE 5 MG PO TABS
5.0000 mg | ORAL_TABLET | Freq: Every day | ORAL | Status: DC
  Administered 2018-05-19: 09:00:00 5 mg via ORAL
  Filled 2018-05-19: qty 1

## 2018-05-19 MED ORDER — FUROSEMIDE 10 MG/ML IJ SOLN
40.0000 mg | Freq: Two times a day (BID) | INTRAMUSCULAR | Status: DC
  Administered 2018-05-19 – 2018-05-21 (×5): 40 mg via INTRAVENOUS
  Filled 2018-05-19 (×5): qty 4

## 2018-05-19 MED ORDER — IRBESARTAN 150 MG PO TABS
150.0000 mg | ORAL_TABLET | Freq: Every day | ORAL | Status: DC
  Administered 2018-05-19: 150 mg via ORAL
  Filled 2018-05-19: qty 1

## 2018-05-19 MED ORDER — CARVEDILOL 3.125 MG PO TABS
3.1250 mg | ORAL_TABLET | Freq: Two times a day (BID) | ORAL | Status: DC
  Administered 2018-05-19 – 2018-05-21 (×5): 3.125 mg via ORAL
  Filled 2018-05-19 (×5): qty 1

## 2018-05-19 MED ORDER — HYDROCHLOROTHIAZIDE 12.5 MG PO CAPS
12.5000 mg | ORAL_CAPSULE | Freq: Every day | ORAL | Status: DC
  Administered 2018-05-19: 09:00:00 12.5 mg via ORAL
  Filled 2018-05-19: qty 1

## 2018-05-19 NOTE — Progress Notes (Signed)
[place], [place] 05/19/18  Subjective:   Patient known to our practice from outpatient dialysis.  He presented via EMS from home after a fall on April 22.  Per EMS notes, patient missed the chair and fell straight back.  Patient Underwent right hip pinning by Dr. Rudene Christians on 4/23 04/25 0701 - 04/26 0700 In: 716.7 [I.V.:616.7; IV Piggyback:100] Out: 1977 [Urine:470] 1500 removed with HD Last evening CXR showed Pulm congestion.  This morning patient desat to 70% on room air - placed on 4 L O2 At present looks comfortable. Ate some of his breakfast Low grade fever  Objective:  Vital signs in last 24 hours:  Temp:  [98.3 F (36.8 C)-99.9 F (37.7 C)] 99.9 F (37.7 C) (04/26 0441) Pulse Rate:  [73-95] 95 (04/26 0441) Resp:  [14-24] 18 (04/25 1937) BP: (88-138)/(52-74) 118/64 (04/26 0441) SpO2:  [77 %-100 %] 94 % (04/26 0502) Weight:  [89.8 kg-114.8 kg] 114.8 kg (04/26 0512)  Weight change:  Filed Weights   05/18/18 0825 05/18/18 1130 05/19/18 0512  Weight: 91.2 kg 89.8 kg 114.8 kg    Intake/Output:    Intake/Output Summary (Last 24 hours) at 05/19/2018 0926 Last data filed at 05/19/2018 0160 Gross per 24 hour  Intake 95.87 ml  Output 1977 ml  Net -1881.13 ml     Physical Exam: General:  Chronically ill-appearing, laying in the bed  HEENT  anicteric, moist oral mucous membranes  Neck  supple  Pulm/lungs  normal breathing effort, mild crackles at bases b/l, [place] Oxy 4L  CVS/Heart  irregular, no rub  Abdomen:   Soft, nontender  Extremities:  Trace peripheral edema  Neurologic:  resting quietly, able to answer simple Questions  Skin:  No acute rashes  Access:  Right arm AV fistula, good bruit       Basic Metabolic Panel:  Recent Labs  Lab 05/15/18 1705 05/16/18 0255 05/16/18 1647 05/17/18 0539 05/18/18 0449  NA 135 136 138 138 138  K 5.2* 4.6 4.1 4.4 4.6  CL 96* 100  --  98 100  CO2 25 23  --  24 25  GLUCOSE 146* 145* 123* 155*  141*  BUN 41* 44*  --  40* 54*  CREATININE 5.23* 5.62*  --  5.30* 6.18*  CALCIUM 8.4* 8.5*  --  8.1* 8.2*  MG  --   --   --  1.9 2.0  PHOS  --   --   --  5.2* 6.3*     CBC: Recent Labs  Lab 05/15/18 1705 05/16/18 0255 05/16/18 1647 05/17/18 0539 05/18/18 0449 05/19/18 0534  WBC 7.6 7.7  --  9.5 12.1* 8.8  NEUTROABS 5.2  --   --   --   --   --   HGB 11.8* 11.5* 10.9* 11.6* 8.6* 9.9*  HCT 35.9* 35.2* 32.0* 35.4* 27.0* 30.9*  MCV 86.3 86.9  --  86.3 88.2 88.5  PLT 209 209  --  188 190 167      Lab Results  Component Value Date   HEPBSAG Negative 09/27/2017      Microbiology:  Recent Results (from the past 240 hour(s))  Surgical PCR screen     Status: Abnormal   Collection Time: 05/15/18 10:05 PM  Result Value Ref Range Status   MRSA, PCR POSITIVE (A) NEGATIVE Final    Comment: RESULT CALLED TO, READ BACK BY AND VERIFIED WITH: Kathyrn Drown RN [id number] 05/15/2018 HNM    Staphylococcus aureus POSITIVE (A) NEGATIVE Final  Comment: (NOTE) The Xpert SA Assay (FDA approved for NASAL specimens in patients 51 years of age and older), is one component of a comprehensive surveillance program. It is not intended to diagnose infection nor to guide or monitor treatment. Performed at [facility], [address] [place], [place] [address]   CULTURE, BLOOD (ROUTINE X 2) w Reflex to ID Panel     Status: None (Preliminary result)   Collection Time: 05/16/18 10:22 AM  Result Value Ref Range Status   Specimen Description BLOOD LEFT ARM  Final   Special Requests   Final    BOTTLES DRAWN AEROBIC AND ANAEROBIC Blood Culture adequate volume   Culture   Final    NO GROWTH 3 DAYS Performed at [facility], [address], [place], [place] [address]    Report Status PENDING  Incomplete  CULTURE, BLOOD (ROUTINE X 2) w Reflex to ID Panel     Status: None (Preliminary result)   Collection Time: 05/16/18 10:29 AM  Result Value Ref Range Status   Specimen  Description BLOOD LEFT ANTECUBITAL  Final   Special Requests   Final    BOTTLES DRAWN AEROBIC AND ANAEROBIC Blood Culture adequate volume   Culture   Final    NO GROWTH 3 DAYS Performed at [facility], [address] [place], [place] [address]    Report Status PENDING  Incomplete    Coagulation Studies: No results for input(s): LABPROT, INR in the last 72 hours.  Urinalysis: No results for input(s): COLORURINE, LABSPEC, PHURINE, GLUCOSEU, HGBUR, BILIRUBINUR, KETONESUR, PROTEINUR, UROBILINOGEN, NITRITE, LEUKOCYTESUR in the last 72 hours.  Invalid input(s): APPERANCEUR    Imaging: Dg Chest Port 1 View  Result Date: 05/18/2018 CLINICAL DATA:  Hypoxia EXAM: PORTABLE CHEST 1 VIEW COMPARISON:  05/16/2018, 05/15/2018, 01/31/2018, CT chest 12/12/2017 FINDINGS: Diffuse bilateral reticular opacity. Emphysematous disease. Streaky atelectasis left base. No pleural effusion. Stable enlarged cardiomediastinal silhouette. Aortic atherosclerosis. No pneumothorax. Central congestion IMPRESSION: 1. Cardiomegaly with central congestion. Mild diffuse increased interstitial opacity, suspect for acute interstitial edema or inflammatory process on underlying chronic change 2. Emphysematous disease Electronically Signed   By: Donavan Foil M.D.   On: 05/18/2018 18:14     Medications:    . allopurinol  50 mg Oral QODAY  . irbesartan  150 mg Oral Daily   And  . amLODipine  5 mg Oral Daily   And  . hydrochlorothiazide  12.5 mg Oral Daily  . aspirin EC  81 mg Oral Daily  . atorvastatin  40 mg Oral Daily  . calcium acetate  1,334 mg Oral TID WC  . carvedilol  3.125 mg Oral BID WC  . Chlorhexidine Gluconate Cloth  6 each Topical Q0600  . docusate sodium  100 mg Oral BID  . epoetin (EPOGEN/PROCRIT) injection  4,000 Units Intravenous Q T,Th,Sa-HD  . furosemide  40 mg Intravenous BID  . heparin injection (subcutaneous)  5,000 Units Subcutaneous Q8H  . insulin aspart  0-5 Units Subcutaneous QHS   . insulin aspart  0-9 Units Subcutaneous TID WC  . mupirocin ointment  1 application Nasal BID  . tamsulosin  0.4 mg Oral QPC breakfast   acetaminophen **OR** acetaminophen, alum & mag hydroxide-simeth, bisacodyl, magnesium citrate, magnesium hydroxide, menthol-cetylpyridinium **OR** phenol, morphine injection, ondansetron **OR** ondansetron (ZOFRAN) IV, oxyCODONE, polyethylene glycol, traMADol  Assessment/ Plan:  73 y.o. African-American male with end-stage renal disease, depression, gout, BPH, history of urinary retention, followed by urologist Dr. Bernardo Heater, history of colonization of urine  Davita Graham/TTS/103.5 kg/Rt arm AVF/  1.  End-stage renal disease -Arrange for hemodialysis on TTS schedule while inpatient - small length needles to be used for HD - iv lasix to continue diuresis  2.  Anemia of chronic kidney disease -Hemoglobin 9.9.   - Epogen with HD treatment  3.  Secondary hyperparathyroidism - Phos 6.3 - start PhosLo  4.  Right hip fracture Underwent right hip pinning on 4/23  5. Hypoxia Removed 1500 cc with HD Start iv lasix  Maintain O2 supplement Hold Anti HTN meds as BP is low   LOS: [place] 4/26/20209:26 AM  [place], [place]  Note: This note was prepared with Dragon dictation. Any transcription errors are unintentional

## 2018-05-19 NOTE — Plan of Care (Signed)
  Problem: Clinical Measurements: Goal: Will remain free from infection Outcome: Progressing Goal: Diagnostic test results will improve Outcome: Progressing Goal: Cardiovascular complication will be avoided Outcome: Progressing   Problem: Activity: Goal: Risk for activity intolerance will decrease Outcome: Progressing   Problem: Nutrition: Goal: Adequate nutrition will be maintained Outcome: Progressing   Problem: Coping: Goal: Level of anxiety will decrease Outcome: Progressing   Problem: Elimination: Goal: Will not experience complications related to bowel motility Outcome: Progressing   Problem: Pain Managment: Goal: General experience of comfort will improve Outcome: Progressing   Problem: Safety: Goal: Ability to remain free from injury will improve Outcome: Progressing   Problem: Skin Integrity: Goal: Risk for impaired skin integrity will decrease Outcome: Progressing   Problem: Activity: Goal: Ability to ambulate and perform ADLs will improve Outcome: Progressing   Problem: Clinical Measurements: Goal: Postoperative complications will be avoided or minimized Outcome: Progressing

## 2018-05-19 NOTE — Progress Notes (Signed)
OT Cancellation Note  Patient Details Name: Joseph Newman MRN: [id number] DOB: 1945-08-12   Cancelled Treatment:    Reason Eval/Treat Not Completed: Fatigue/lethargy limiting ability to participate;Patient declined, no reason specified. Upon attempt pt sleeping heavily, does not wake to verbal cues. Gentle tactile cues provided which alerted pt briefly to therapist. Pt denies pain, reports being too fatigued to participate, pt falls back asleep with head resting on therapist's hand that was resting on bed rail. Pillow adjusted to improve pt's positioning and support comfort. Pt agreeable to OT re-attempting next date.   Jeni Salles, MPH, MS, OTR/L ascom [phone] 05/19/18, 1:31 PM

## 2018-05-19 NOTE — Progress Notes (Signed)
Pt alert and oriented. No complaint of pain during the night. Surgical dressing remaining dry and intact. Still remaining on 4L of oxygen. Lung sounds diminished bilaterally. No complaint of Shortness of breath. Pt has been able to sleep in between care.

## 2018-05-19 NOTE — Progress Notes (Signed)
PT Cancellation Note  Patient Details Name: Joseph Newman MRN: [id number] DOB: 1945/05/14   Cancelled Treatment:    Reason Eval/Treat Not Completed: Fatigue/lethargy limiting ability to participate(patient unable to stay awake) patient declining ot and pt due to fatigue, inability to stay awake. Will return at later time/date.   Janna Arch, PT, DPT   05/19/2018, 1:38 PM

## 2018-05-19 NOTE — Progress Notes (Signed)
Physical Therapy Treatment Patient Details Name: Joseph Newman MRN: [id number] DOB: February 25, 1945 Today's Date: 05/19/2018    History of Present Illness Pt is 73 yo male s/p cannulated hip pinning after a fall at home. PMH of COPD, HTN, renal disease, HLD, DM    PT Comments    Patient initially declined physical therapy upon PT arrival however upon education became agreeable to participate in supine interventions. Patient required Max/total A for repositioning in bed to obtain neutral trunk and L arm alignment, pillow placed under L arm to reduce strain and pain. Patient falling asleep between LE interventions requiring verbal and tactile stimuli to wake up. Patient fell asleep and session terminated early. Current POC remains appropriate at this time.     Follow Up Recommendations  SNF     Equipment Recommendations  Rolling walker with 5" wheels    Recommendations for Other Services OT consult     Precautions / Restrictions Precautions Precautions: Fall Restrictions RLE Weight Bearing: Weight bearing as tolerated    Mobility  Bed Mobility               General bed mobility comments: Max A/total assist for positioning in bed,   Transfers                 General transfer comment: refused OOB today   Ambulation/Gait                 Stairs             Wheelchair Mobility    Modified Rankin (Stroke Patients Only)       Balance                                            Cognition Arousal/Alertness: Lethargic Behavior During Therapy: Flat affect Overall Cognitive Status: No family/caregiver present to determine baseline cognitive functioning                                        Exercises General Exercises - Lower Extremity Quad Sets: AROM;Both;5 reps Short Arc Quad: AAROM;Both;10 reps Heel Slides: AAROM;Both;10 reps Hip ABduction/ADduction: AAROM;Left;10 reps;PROM;Right Other Exercises Other  Exercises: repositioning in bed with max/total A, repositioning of L arm due to positioning under body upon PT arrival, realigning trunk to neutral middle position     General Comments        Pertinent Vitals/Pain Pain Assessment: Faces Faces Pain Scale: Hurts even more Pain Location: with movement of L arm and RLE Pain Descriptors / Indicators: Discomfort Pain Intervention(s): Repositioned    Home Living                      Prior Function            PT Goals (current goals can now be found in the care plan section) Acute Rehab PT Goals Patient Stated Goal: to decrease pain PT Goal Formulation: With patient Time For Goal Achievement: 05/31/18 Potential to Achieve Goals: Fair Progress towards PT goals: Progressing toward goals(very slowly )    Frequency    BID      PT Plan Current plan remains appropriate    Co-evaluation              AM-PAC PT "6 Clicks" Mobility  Outcome Measure  Help needed turning from your back to your side while in a flat bed without using bedrails?: Total Help needed moving from lying on your back to sitting on the side of a flat bed without using bedrails?: Total Help needed moving to and from a bed to a chair (including a wheelchair)?: Total Help needed standing up from a chair using your arms (e.g., wheelchair or bedside chair)?: Total Help needed to walk in hospital room?: Total Help needed climbing 3-5 steps with a railing? : Total 6 Click Score: 6    End of Session Equipment Utilized During Treatment: Oxygen(3L) Activity Tolerance: Patient limited by pain;Patient limited by fatigue Patient left: in bed;with call bell/phone within reach;with bed alarm set;with SCD's reapplied Nurse Communication: Mobility status;Need for lift equipment PT Visit Diagnosis: Other abnormalities of gait and mobility (R26.89);Muscle weakness (generalized) (M62.81);Pain;Difficulty in walking, not elsewhere classified (R26.2) Pain -  Right/Left: Right Pain - part of body: Hip     Time: 1004-1016 PT Time Calculation (min) (ACUTE ONLY): 12 min  Charges:  $Therapeutic Exercise: 8-22 mins                     Janna Arch, PT, DPT     Janna Arch 05/19/2018, 10:29 AM

## 2018-05-19 NOTE — Progress Notes (Signed)
[facility] [place] NAME: Joseph Newman    MR#:  [id number]  DATE OF BIRTH:  09-26-1945  SUBJECTIVE:  CHIEF COMPLAINT:   Chief Complaint  Patient presents with  . Fall   No new complaint this morning.  Worked with physical therapy.  Complain of pain with movements.  He is requiring higher oxygen today.  REVIEW OF SYSTEMS:  Review of Systems  Constitutional: Negative for chills and fever.  HENT: Negative for hearing loss and tinnitus.   Eyes: Negative for blurred vision and double vision.  Respiratory: Negative for cough and hemoptysis.   Cardiovascular: Negative for chest pain and palpitations.  Gastrointestinal: Negative for heartburn and nausea.  Genitourinary: Negative for dysuria and urgency.  Musculoskeletal: Negative for myalgias and neck pain.  Skin: Negative for itching and rash.  Neurological: Negative for dizziness and headaches.  Psychiatric/Behavioral: Negative for depression and hallucinations.    DRUG ALLERGIES:  No Known Allergies VITALS:  Blood pressure 118/64, pulse 95, temperature 99.9 F (37.7 C), temperature source Oral, resp. rate 18, height 6\' 5"  (1.956 m), weight 114.8 kg, SpO2 94 %. PHYSICAL EXAMINATION:   Physical Exam  Constitutional: He is oriented to person, place, and time. He appears well-developed and well-nourished.  HENT:  Head: Normocephalic and atraumatic.  Right Ear: External ear normal.  Eyes: Pupils are equal, round, and reactive to light. Conjunctivae are normal. Right eye exhibits no discharge.  Neck: Normal range of motion. No thyromegaly present.  Cardiovascular: Normal rate, regular rhythm and normal heart sounds.  Respiratory: Effort normal. No respiratory distress. He has no wheezes. He has rales.  GI: Soft. Bowel sounds are normal. He exhibits no distension.  Musculoskeletal: Normal range of motion.        General: No edema.  Neurological: He is alert and oriented to person, place,  and time.  Skin: Skin is warm. He is not diaphoretic. No erythema.   LABORATORY PANEL:  Male CBC Recent Labs  Lab 05/19/18 0534  WBC 8.8  HGB 9.9*  HCT 30.9*  PLT 167   ------------------------------------------------------------------------------------------------------------------ Chemistries  Recent Labs  Lab 05/15/18 1705  05/18/18 0449  NA 135   < > 138  K 5.2*   < > 4.6  CL 96*   < > 100  CO2 25   < > 25  GLUCOSE 146*   < > 141*  BUN 41*   < > 54*  CREATININE 5.23*   < > 6.18*  CALCIUM 8.4*   < > 8.2*  MG  --    < > 2.0  AST 19  --   --   ALT 13  --   --   ALKPHOS 87  --   --   BILITOT 0.9  --   --    < > = values in this interval not displayed.   RADIOLOGY:  Dg Chest Port 1 View  Result Date: 05/18/2018 CLINICAL DATA:  Hypoxia EXAM: PORTABLE CHEST 1 VIEW COMPARISON:  05/16/2018, 05/15/2018, 01/31/2018, CT chest 12/12/2017 FINDINGS: Diffuse bilateral reticular opacity. Emphysematous disease. Streaky atelectasis left base. No pleural effusion. Stable enlarged cardiomediastinal silhouette. Aortic atherosclerosis. No pneumothorax. Central congestion IMPRESSION: 1. Cardiomegaly with central congestion. Mild diffuse increased interstitial opacity, suspect for acute interstitial edema or inflammatory process on underlying chronic change 2. Emphysematous disease Electronically Signed   By: Donavan Foil M.D.   On: 05/18/2018 18:14   ASSESSMENT AND PLAN:   Right hip fracture-s/p mechanical fall  Patient already seen by orthopedic service on day 3 postop Pains appear controlled. Physical therapist at bedside evaluating patient.  Anticipate discharge to skilled nursing facility after the weekend.  ESRD on HD TTS- chest x-ray with mild pulmonary edema.  Patient on 2 L O2.  Wean off oxygen as tolerated. Nephrology service following.  Fevers;  Temperature of 100.6 postoperatively. Patient has remained afebrile since 2 days.  Chest x-ray done  with similar appearance with  prior studies with findings consistent with mild pulmonary edema or multifocal infection.  No shortness of breath.  Patient was placed empirically on IV antibiotics with IV Zosyn pending blood culture results. Pneumonia was suspected but ruled out.  Patient does not have symptoms. Stop IV antibiotics.  Blood cultures are negative. His procalcitonin is higher but that could be due to renal failure issues.  We will continue monitoring off antibiotic. WBC count is getting better.  Hypertension-blood pressures was on the lower side last night -Decreased dose on Coreg, olmesartan, amlodipine, HCTZ  Type 2 diabetes-blood sugars controlled  -SSI  Hyperlipidemia-stable -Continue home statin  BPH-stable -Continue home Flomax  DVT prophylaxis- patient started on Lovenox already.  Also has SCD in place.     All the records are reviewed and case discussed with Care Management/Social Worker. Management plans discussed with the patient, family and they are in agreement.  CODE STATUS: Full Code  TOTAL TIME TAKING CARE OF THIS PATIENT: 35 minutes.   More than 50% of the time was spent in counseling/coordination of care: YES  POSSIBLE D/C IN 1-2 DAYS, DEPENDING ON CLINICAL CONDITION.   Vaughan Basta M.D on 05/19/2018 at 1:37 PM  Between 7am to 6pm - Pager - [phone]  After 6pm go to [email or web address] - [employer] [facility]  Office  [phone]  CC: Primary care physician; Steele Sizer, MD  Note: This dictation was prepared with Dragon dictation along with smaller phrase technology. Any transcriptional errors that result from this process are unintentional.

## 2018-05-19 NOTE — Progress Notes (Signed)
Subjective: 3 Days Post-Op Procedure(s) (LRB): CANNULATED HIP PINNING (Right) Patient reports pain as mild to moderate.   Patient seen in rounds with Dr. Roland Rack. Patient is well, and has had no acute complaints or problems Plan is to go home with home health physical therapy after hospital stay. Negative for chest pain and shortness of breath Fever: no Gastrointestinal: Negative for nausea and vomiting. Positive hematuria on removal of Foley catheter.  Objective: Vital signs in last 24 hours: Temp:  [98.3 F (36.8 C)-99.9 F (37.7 C)] 99.9 F (37.7 C) (04/26 0441) Pulse Rate:  [73-95] 95 (04/26 0441) Resp:  [14-24] 18 (04/25 1937) BP: (88-138)/(52-98) 118/64 (04/26 0441) SpO2:  [77 %-100 %] 94 % (04/26 0502) Weight:  [89.8 kg-114.8 kg] 114.8 kg (04/26 0512)  Intake/Output from previous day:  Intake/Output Summary (Last 24 hours) at 05/19/2018 0733 Last data filed at 05/19/2018 0512 Gross per 24 hour  Intake 95.87 ml  Output 1977 ml  Net -1881.13 ml    Intake/Output this shift: No intake/output data recorded.  Labs: Recent Labs    05/16/18 1647 05/17/18 0539 05/18/18 0449 05/19/18 0534  HGB 10.9* 11.6* 8.6* 9.9*   Recent Labs    05/18/18 0449 05/19/18 0534  WBC 12.1* 8.8  RBC 3.06* 3.49*  HCT 27.0* 30.9*  PLT 190 167   Recent Labs    05/17/18 0539 05/18/18 0449  NA 138 138  K 4.4 4.6  CL 98 100  CO2 24 25  BUN 40* 54*  CREATININE 5.30* 6.18*  GLUCOSE 155* 141*  CALCIUM 8.1* 8.2*   No results for input(s): LABPT, INR in the last 72 hours.   EXAM General - Patient is Alert and Oriented Extremity - Neurovascular intact Dorsiflexion/Plantar flexion intact Compartment soft Dressing/Incision - clean, dry, no drainage Motor Function - intact, moving foot and toes well on exam.  Limited walking with physical therapy  Past Medical History:  Diagnosis Date  . Acute metabolic encephalopathy 78/24/2353   Per my chart  . Arthritis   . Chronic airway  obstruction, not elsewhere classified    worked in [employer] and was exposed to [employer]  . 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 Holley Raring, nephrologist.  on dialysis tu - thur - sat  . Depressive disorder, not elsewhere classified   . Dialysis patient [facility]) 01/2018   dialysis tuesday - thursday - saturday  . Gout   . Gout   . Heart murmur    followed by dr. Clayborn Bigness  . Hyperlipidemia   . Hypertension   . Hypertrophy of prostate without urinary obstruction and other lower urinary tract symptoms (LUTS)   . Impotence of organic origin   . Microalbuminuria   . 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 Obesity, unspecified   . Plantar wart   . Sleep apnea    years ago but never used a cpap  . Synovitis and tenosynovitis, unspecified   . Type II or unspecified type diabetes mellitus without mention of complication, uncontrolled   . Unspecified venous (peripheral) insufficiency   . UTI (urinary tract infection)    frequent with ESBL     Assessment/Plan: 3 Days Post-Op Procedure(s) (LRB): CANNULATED HIP PINNING (Right) Active Problems:   Closed right hip fracture (HCC)  Estimated body mass index is 30 kg/m as calculated from the following:   Height as of this encounter: 6\' 5"  (1.956 m).   Weight as of this encounter: 114.8 kg. Advance diet Up  with therapy D/C IV fluids Discharge to SNF when cleared by medicine Follow-up at [facility] clinic in 2 weeks for staple removal.  DVT Prophylaxis - Lovenox, Foot Pumps and TED hose Weight-Bearing as tolerated to right leg  Reche Dixon, PA-C Orthopaedic Surgery 05/19/2018, 7:33 AM

## 2018-05-19 NOTE — Progress Notes (Signed)
Pt took his oxygen off this morning oxygen saturation in the 70's on room air. Oxygen placed back on pt and was able to get back to 94% on 4L.

## 2018-05-20 ENCOUNTER — Inpatient Hospital Stay: Payer: Medicare Other

## 2018-05-20 LAB — CBC
HCT: 32 % — ABNORMAL LOW (ref 39.0–52.0)
Hemoglobin: 10.4 g/dL — ABNORMAL LOW (ref 13.0–17.0)
MCH: 28.6 pg (ref 26.0–34.0)
MCHC: 32.5 g/dL (ref 30.0–36.0)
MCV: 87.9 fL (ref 80.0–100.0)
Platelets: 188 10*3/uL (ref 150–400)
RBC: 3.64 MIL/uL — ABNORMAL LOW (ref 4.22–5.81)
RDW: 14.7 % (ref 11.5–15.5)
WBC: 7.6 10*3/uL (ref 4.0–10.5)
nRBC: 0 % (ref 0.0–0.2)

## 2018-05-20 LAB — BASIC METABOLIC PANEL
Anion gap: 11 (ref 5–15)
BUN: 52 mg/dL — ABNORMAL HIGH (ref 8–23)
CO2: 25 mmol/L (ref 22–32)
Calcium: 8.8 mg/dL — ABNORMAL LOW (ref 8.9–10.3)
Chloride: 100 mmol/L (ref 98–111)
Creatinine, Ser: 6.41 mg/dL — ABNORMAL HIGH (ref 0.61–1.24)
GFR calc Af Amer: 9 mL/min — ABNORMAL LOW (ref >60)
GFR calc non Af Amer: 8 mL/min — ABNORMAL LOW (ref >60)
Glucose, Bld: 139 mg/dL — ABNORMAL HIGH (ref 70–99)
Potassium: 4.9 mmol/L (ref 3.5–5.1)
Sodium: 136 mmol/L (ref 135–145)

## 2018-05-20 LAB — GLUCOSE, CAPILLARY
Glucose-Capillary: 132 mg/dL — ABNORMAL HIGH (ref 70–99)
Glucose-Capillary: 254 mg/dL — ABNORMAL HIGH (ref 70–99)
Glucose-Capillary: 184 mg/dL — ABNORMAL HIGH (ref 70–99)
Glucose-Capillary: 178 mg/dL — ABNORMAL HIGH (ref 70–99)

## 2018-05-20 LAB — PROCALCITONIN: Procalcitonin: 0.96 ng/mL

## 2018-05-20 NOTE — Care Management Important Message (Signed)
Important Message  Patient Details  Name: Joseph Newman MRN: [id number] Date of Birth: 1945-03-27   Medicare Important Message Given:  Yes Verbally explained to [name] th patient's wife, copy left in the room at bedside   Su Hilt, RN 05/20/2018, 2:00 PM

## 2018-05-20 NOTE — Progress Notes (Signed)
Physical Therapy Treatment Patient Details Name: Joseph Newman MRN: [id number] DOB: 09/02/1945 Today's Date: 05/20/2018    History of Present Illness Pt is 73 yo male s/p cannulated hip pinning after a fall at home. PMH of COPD, HTN, renal disease, HLD, DM    PT Comments    Patient agreeable to PT/OT cotreat for safety, ADL performance, and transfers. Patient did not have oxygen on upon therapists entering room and therapists unable to obtain reading with pulse ox due to temperature of digits. Patient's nurse informed that oxygen was placed back on. Patient required total assist x 2 for supine to sit positioning with assistance for UE placement and trunk control performed by OT and LE and pelvis positioning and activation by PT. Patient demonstrated improved static sit positioning capacity with ability to maintain seated position with SUE support and SBA-Min A for ~10 minutes. During seated EOB, two physicians entered room (at different times) and patient conversed with them indicating improved multi task ability. Patient challenged with consistent command follow inconsistently requiring tactile and verbal cueing for task performance. Patient reported 10/10 pain with sitting EOB however HR remained between 116-120. Current POC remains appropriate at this time as does continued need for skilled physical therapy.    Follow Up Recommendations  SNF     Equipment Recommendations  Rolling walker with 5" wheels    Recommendations for Other Services OT consult     Precautions / Restrictions Precautions Precautions: Fall Restrictions Weight Bearing Restrictions: Yes RLE Weight Bearing: Weight bearing as tolerated Other Position/Activity Restrictions: limited ability to assist with transfers    Mobility  Bed Mobility Overal bed mobility: Needs Assistance Bed Mobility: Supine to Sit;Sit to Supine     Supine to sit: +2 for physical assistance;Total assist Sit to supine: Max assist;+2 for  physical assistance   General bed mobility comments: X2 for positioning patient in bed. Sitting EOB x10 minutes, HR maintained within 117-120 range, once positioned able to maintain seated with Min and SBA  Transfers                    Ambulation/Gait                 Stairs             Wheelchair Mobility    Modified Rankin (Stroke Patients Only)       Balance Overall balance assessment: Needs assistance Sitting-balance support: Feet supported;Single extremity supported Sitting balance-Leahy Scale: Fair Sitting balance - Comments: Patient SBA-MinA with SUE support seated EOB ~10 minutes with dressing .  Postural control: Left lateral lean(weight shift off RLE)   Standing balance-Leahy Scale: Zero                              Cognition Arousal/Alertness: Awake/alert Behavior During Therapy: Flat affect Overall Cognitive Status: No family/caregiver present to determine baseline cognitive functioning                                 General Comments: inconsistant command follow, curing verbally and tactily for task orientation and sequencing.       Exercises Other Exercises Other Exercises: supine <>sit total x 2 with OT postioning Upper body and cueing for positioning of arms and hands for attempt of patient assist, PT assisting with LE's for positioning and hip precaution safety.  Other Exercises: Sitting EOB: ~10  minutes, OT assisting with education and performance of gown change and self dressing with UE movements, PT assisting in trunk stability, proper alignment of hips to neutralize weight shift for equal weightbearing and trunk positioning. Cueing required for use of UE's with both verbal and tactile assistance. Patient Min A for dressing and Max A for grooming. Inconsistant command follow with max cueing for task orientation.     General Comments General comments (skin integrity, edema, etc.): noted bruising of back on L  side , atrophy of L hand musculature       Pertinent Vitals/Pain Pain Assessment: 0-10 Pain Score: 10-Worst pain ever Pain Location: patient reports 10/10 pain with tranfers and sitting EOB in R hip  Pain Descriptors / Indicators: Discomfort Pain Intervention(s): Repositioned;Monitored during session    Home Living                      Prior Function            PT Goals (current goals can now be found in the care plan section) Acute Rehab PT Goals Patient Stated Goal: to decrease pain PT Goal Formulation: With patient Time For Goal Achievement: 05/31/18 Potential to Achieve Goals: Fair Progress towards PT goals: Progressing toward goals(slowly )    Frequency    BID      PT Plan Current plan remains appropriate    Co-evaluation PT/OT/SLP Co-Evaluation/Treatment: Yes Reason for Co-Treatment: Complexity of the patient's impairments (multi-system involvement);To address functional/ADL transfers PT goals addressed during session: Mobility/safety with mobility;Strengthening/ROM;Balance OT goals addressed during session: ADL's and self-care;Strengthening/ROM      AM-PAC PT "6 Clicks" Mobility   Outcome Measure  Help needed turning from your back to your side while in a flat bed without using bedrails?: Total Help needed moving from lying on your back to sitting on the side of a flat bed without using bedrails?: Total Help needed moving to and from a bed to a chair (including a wheelchair)?: Total Help needed standing up from a chair using your arms (e.g., wheelchair or bedside chair)?: Total Help needed to walk in hospital room?: Total Help needed climbing 3-5 steps with a railing? : Total 6 Click Score: 6    End of Session Equipment Utilized During Treatment: Oxygen(oxygen initially off upon PT/OT entering room, put back on: ) Activity Tolerance: Patient limited by pain;Patient limited by fatigue Patient left: in bed;with call bell/phone within reach;with  bed alarm set;with SCD's reapplied Nurse Communication: Mobility status(oxygen put back on, vitals and performance ) PT Visit Diagnosis: Other abnormalities of gait and mobility (R26.89);Muscle weakness (generalized) (M62.81);Pain;Difficulty in walking, not elsewhere classified (R26.2) Pain - Right/Left: Right Pain - part of body: Hip     Time: 6203-5597 PT Time Calculation (min) (ACUTE ONLY): 42 min  Charges:  $Therapeutic Activity: 23-37 mins                    Janna Arch, PT, DPT    Janna Arch 05/20/2018, 11:44 AM

## 2018-05-20 NOTE — Progress Notes (Signed)
PT Cancellation Note  Patient Details Name: Emrik Erhard MRN: [id number] DOB: 05-30-1945   Cancelled Treatment:    Reason Eval/Treat Not Completed: Fatigue/lethargy limiting ability to participate(patient sleeping). Patient sleeping upon PT attempt, will attempt again at later time/date.   Janna Arch, PT, DPT   05/20/2018, 4:44 PM

## 2018-05-20 NOTE — Progress Notes (Signed)
[place], [place] 05/20/18  Subjective:  Patient seen at bedside. Physical therapy working with him. Currently he is total assist.   Objective:  Vital signs in last 24 hours:  Temp:  [97.5 F (36.4 C)-98.3 F (36.8 C)] 97.5 F (36.4 C) (04/27 0752) Pulse Rate:  [74-117] 117 (04/27 0752) Resp:  [17-26] 26 (04/27 0752) BP: (114-127)/(62-75) 119/75 (04/27 0752) SpO2:  [92 %-98 %] 98 % (04/27 0752) Weight:  [105.7 kg] 105.7 kg (04/27 0515)  Weight change: 14.5 kg Filed Weights   05/18/18 1130 05/19/18 0512 05/20/18 0515  Weight: 89.8 kg 114.8 kg 105.7 kg    Intake/Output:    Intake/Output Summary (Last 24 hours) at 05/20/2018 1147 Last data filed at 05/20/2018 0415 Gross per 24 hour  Intake -  Output 1450 ml  Net -1450 ml     Physical Exam: General:  Chronically ill-appearing, laying in the bed  HEENT  anicteric, moist oral mucous membranes  Neck  supple  Pulm/lungs  normal breathing effort, mild crackles at bases b/l  CVS/Heart  irregular, no rub  Abdomen:   Soft, nontender  Extremities:  Trace peripheral edema  Neurologic:  awake, alert, follows commands  Skin:  No acute rashes  Access:  Right arm AV fistula, good bruit       Basic Metabolic Panel:  Recent Labs  Lab 05/15/18 1705 05/16/18 0255 05/16/18 1647 05/17/18 0539 05/18/18 0449 05/20/18 0436  NA 135 136 138 138 138 136  K 5.2* 4.6 4.1 4.4 4.6 4.9  CL 96* 100  --  98 100 100  CO2 25 23  --  24 25 25   GLUCOSE 146* 145* 123* 155* 141* 139*  BUN 41* 44*  --  40* 54* 52*  CREATININE 5.23* 5.62*  --  5.30* 6.18* 6.41*  CALCIUM 8.4* 8.5*  --  8.1* 8.2* 8.8*  MG  --   --   --  1.9 2.0  --   PHOS  --   --   --  5.2* 6.3*  --      CBC: Recent Labs  Lab 05/15/18 1705 05/16/18 0255 05/16/18 1647 05/17/18 0539 05/18/18 0449 05/19/18 0534 05/20/18 0436  WBC 7.6 7.7  --  9.5 12.1* 8.8 7.6  NEUTROABS 5.2  --   --   --   --   --   --   HGB 11.8* 11.5* 10.9* 11.6*  8.6* 9.9* 10.4*  HCT 35.9* 35.2* 32.0* 35.4* 27.0* 30.9* 32.0*  MCV 86.3 86.9  --  86.3 88.2 88.5 87.9  PLT 209 209  --  188 190 167 188      Lab Results  Component Value Date   HEPBSAG Negative 09/27/2017      Microbiology:  Recent Results (from the past 240 hour(s))  Surgical PCR screen     Status: Abnormal   Collection Time: 05/15/18 10:05 PM  Result Value Ref Range Status   MRSA, PCR POSITIVE (A) NEGATIVE Final    Comment: RESULT CALLED TO, READ BACK BY AND VERIFIED WITH: Kathyrn Drown RN [phone] 05/15/2018 HNM    Staphylococcus aureus POSITIVE (A) NEGATIVE Final    Comment: (NOTE) The Xpert SA Assay (FDA approved for NASAL specimens in patients 39 years of age and older), is one component of a comprehensive surveillance program. It is not intended to diagnose infection nor to guide or monitor treatment. Performed at [facility], [address], [place], [place] [address]   CULTURE, BLOOD (ROUTINE X 2)  w Reflex to ID Panel     Status: None (Preliminary result)   Collection Time: 05/16/18 10:22 AM  Result Value Ref Range Status   Specimen Description BLOOD LEFT ARM  Final   Special Requests   Final    BOTTLES DRAWN AEROBIC AND ANAEROBIC Blood Culture adequate volume   Culture   Final    NO GROWTH 4 DAYS Performed at [facility], [address], [place], [place] [address]    Report Status PENDING  Incomplete  CULTURE, BLOOD (ROUTINE X 2) w Reflex to ID Panel     Status: None (Preliminary result)   Collection Time: 05/16/18 10:29 AM  Result Value Ref Range Status   Specimen Description BLOOD LEFT ANTECUBITAL  Final   Special Requests   Final    BOTTLES DRAWN AEROBIC AND ANAEROBIC Blood Culture adequate volume   Culture   Final    NO GROWTH 4 DAYS Performed at [facility], [address], [place], [place] [address]    Report Status PENDING  Incomplete    Coagulation Studies: No results for input(s): LABPROT, INR in the last 72  hours.  Urinalysis: No results for input(s): COLORURINE, LABSPEC, PHURINE, GLUCOSEU, HGBUR, BILIRUBINUR, KETONESUR, PROTEINUR, UROBILINOGEN, NITRITE, LEUKOCYTESUR in the last 72 hours.  Invalid input(s): APPERANCEUR    Imaging: Dg Chest 1 View  Result Date: 05/20/2018 CLINICAL DATA:  Hypoxia. EXAM: CHEST  1 VIEW COMPARISON:  One-view chest x-ray 05/18/2018 FINDINGS: Heart is enlarged. Atherosclerotic calcifications are present at the aortic arch. Diffuse interstitial and airspace pattern has increased. IMPRESSION: 1. Cardiomegaly with diffuse increased interstitial and airspace opacity. While this likely represents edema and congestive heart failure, infection is not excluded. Electronically Signed   By: San Morelle M.D.   On: 05/20/2018 08:54   Dg Chest Port 1 View  Result Date: 05/18/2018 CLINICAL DATA:  Hypoxia EXAM: PORTABLE CHEST 1 VIEW COMPARISON:  05/16/2018, 05/15/2018, 01/31/2018, CT chest 12/12/2017 FINDINGS: Diffuse bilateral reticular opacity. Emphysematous disease. Streaky atelectasis left base. No pleural effusion. Stable enlarged cardiomediastinal silhouette. Aortic atherosclerosis. No pneumothorax. Central congestion IMPRESSION: 1. Cardiomegaly with central congestion. Mild diffuse increased interstitial opacity, suspect for acute interstitial edema or inflammatory process on underlying chronic change 2. Emphysematous disease Electronically Signed   By: Donavan Foil M.D.   On: 05/18/2018 18:14     Medications:    . allopurinol  50 mg Oral QODAY  . aspirin EC  81 mg Oral Daily  . atorvastatin  40 mg Oral Daily  . calcium acetate  1,334 mg Oral TID WC  . carvedilol  3.125 mg Oral BID WC  . Chlorhexidine Gluconate Cloth  6 each Topical Q0600  . docusate sodium  100 mg Oral BID  . epoetin (EPOGEN/PROCRIT) injection  4,000 Units Intravenous Q T,Th,Sa-HD  . furosemide  40 mg Intravenous BID  . heparin injection (subcutaneous)  5,000 Units Subcutaneous Q8H  .  insulin aspart  0-5 Units Subcutaneous QHS  . insulin aspart  0-9 Units Subcutaneous TID WC  . mupirocin ointment  1 application Nasal BID  . tamsulosin  0.4 mg Oral QPC breakfast   acetaminophen **OR** acetaminophen, alum & mag hydroxide-simeth, bisacodyl, magnesium citrate, magnesium hydroxide, menthol-cetylpyridinium **OR** phenol, morphine injection, ondansetron **OR** ondansetron (ZOFRAN) IV, oxyCODONE, polyethylene glycol, traMADol  Assessment/ Plan:  73 y.o. African-American male with end-stage renal disease, depression, gout, BPH, history of urinary retention, followed by urologist Dr. Bernardo Heater, history of colonization of urine  Davita Graham/TTS/103.5 kg/Rt arm AVF/  1.  End-stage renal disease -No urgent indication for dialysis at the moment.  We will plan for dialysis again tomorrow.  2.  Anemia of chronic kidney disease -Hemoglobin 10.4 at last check.  Maintain the patient on Epogen with dialysis treatments.  3.  Secondary hyperparathyroidism -Recheck serum phosphorus tomorrow.  Otherwise maintain the patient on calcium acetate 2 tablets p.o. 3 times daily with meals.  4.  Right hip fracture Underwent right hip pinning on 4/23  5. Hypoxia Continue Lasix 40 mg IV twice daily.  We will plan for dialysis as scheduled for tomorrow.   LOS: 5 Jarelis Ehlert 4/27/202011:47 AM  [place], [place]  Note: This note was prepared with Dragon dictation. Any transcription errors are unintentional

## 2018-05-20 NOTE — TOC Progression Note (Signed)
Transition of Care [facility]) - Progression Note    Patient Details  Name: Joseph Newman MRN: [id number] Date of Birth: [birth year]  Transition [facility]) CM/SW Contact  Su Hilt, RN Phone Number: 05/20/2018, 2:01 PM  Clinical Narrative:     Spoke with the spouse Joseph Newman I explained the patient's current mobility and that PT and OT are both recommending SNF for rehab, she stated that he has been to rehab before and did not do well, She agrees to do a bed search and then decide, She stated that they have a hoyer lift a Hospital bed and a WC at home, I asked if she would be able to transport the patient as a 2 person assist to dialysis, she stated she would talk to her sons and get their input but to go ahead and do a bed search as long as it was not Geophysicist/field seismologist  Expected Discharge Plan: [place] Barriers to Discharge: Continued Medical Work up  Expected Discharge Plan and Services Expected Discharge Plan: [place] Choice: [place] arrangements for the past 2 months: [place]: [facility] Date [place]: 05/17/18 Time [place]: 1201 Representative spoke with at [place]: [place] ([place]) Interventions    Readmission Risk Interventions Readmission Risk Prevention Plan 05/17/2018  Transportation Screening Complete  Medication Review Press photographer) Complete  HRI or [place] Complete  [employer] Patient Refused  Some recent data might be hidden

## 2018-05-20 NOTE — Progress Notes (Signed)
[place] at [place] NAME: Joseph Newman    MR#:  [id number]  DATE OF BIRTH:  [birth year]  SUBJECTIVE:  CHIEF COMPLAINT:   Chief Complaint  Patient presents with  . Fall   No new complaint this morning.  Worked with physical therapy.  Complain of pain with movements.  He is requiring 3 ltr oxygen , but not in any discomfort today.  REVIEW OF SYSTEMS:  Review of Systems  Constitutional: Negative for chills and fever.  HENT: Negative for hearing loss and tinnitus.   Eyes: Negative for blurred vision and double vision.  Respiratory: Negative for cough and hemoptysis.   Cardiovascular: Negative for chest pain and palpitations.  Gastrointestinal: Negative for heartburn and nausea.  Genitourinary: Negative for dysuria and urgency.  Musculoskeletal: Negative for myalgias and neck pain.  Skin: Negative for itching and rash.  Neurological: Negative for dizziness and headaches.  Psychiatric/Behavioral: Negative for depression and hallucinations.    DRUG ALLERGIES:  No Known Allergies VITALS:  Blood pressure 127/82, pulse (!) 117, temperature (!) 97.5 F (36.4 C), temperature source Oral, resp. rate (!) 26, height 6\' 5"  (1.956 m), weight 105.7 kg, SpO2 90 %. PHYSICAL EXAMINATION:   Physical Exam  Constitutional: He is oriented to person, place, and time. He appears well-developed and well-nourished.  HENT:  Head: Normocephalic and atraumatic.  Right Ear: External ear normal.  Eyes: Pupils are equal, round, and reactive to light. Conjunctivae are normal. Right eye exhibits no discharge.  Neck: Normal range of motion. No thyromegaly present.  Cardiovascular: Normal rate, regular rhythm and normal heart sounds.  Respiratory: Effort normal. No respiratory distress. He has no wheezes. He has rales.  GI: Soft. Bowel sounds are normal. He exhibits no distension.  Musculoskeletal: Normal range of motion.        General: No edema.  Neurological: He  is alert and oriented to person, place, and time.  Generalized weakness  Skin: Skin is warm. He is not diaphoretic. No erythema.   LABORATORY PANEL:  Male CBC Recent Labs  Lab 05/20/18 0436  WBC 7.6  HGB 10.4*  HCT 32.0*  PLT 188   ------------------------------------------------------------------------------------------------------------------ Chemistries  Recent Labs  Lab 05/15/18 1705  05/18/18 0449 05/20/18 0436  NA 135   < > 138 136  K 5.2*   < > 4.6 4.9  CL 96*   < > 100 100  CO2 25   < > 25 25  GLUCOSE 146*   < > 141* 139*  BUN 41*   < > 54* 52*  CREATININE 5.23*   < > 6.18* 6.41*  CALCIUM 8.4*   < > 8.2* 8.8*  MG  --    < > 2.0  --   AST 19  --   --   --   ALT 13  --   --   --   ALKPHOS 87  --   --   --   BILITOT 0.9  --   --   --    < > = values in this interval not displayed.   RADIOLOGY:  Dg Chest 1 View  Result Date: 05/20/2018 CLINICAL DATA:  Hypoxia. EXAM: CHEST  1 VIEW COMPARISON:  One-view chest x-ray 05/18/2018 FINDINGS: Heart is enlarged. Atherosclerotic calcifications are present at the aortic arch. Diffuse interstitial and airspace pattern has increased. IMPRESSION: 1. Cardiomegaly with diffuse increased interstitial and airspace opacity. While this likely represents edema and congestive heart failure, infection is not  excluded. Electronically Signed   By: San Morelle M.D.   On: 05/20/2018 08:54   ASSESSMENT AND PLAN:   Right hip fracture-s/p mechanical fall Patient already seen by orthopedic service on day 3 postop Pains appear controlled. Physical therapist at bedside evaluating patient.  Anticipate discharge to skilled nursing facility after the weekend. Patient and his wife were initially refusing to go to a rehab facility and wanted to go home but today they agreed for the bed search.  ESRD on HD TTS- chest x-ray with mild pulmonary edema.  Patient on 2 L O2.  Wean off oxygen as tolerated. Nephrology service following.  Fevers;   Temperature of 100.6 postoperatively. Patient has remained afebrile since 2 days.  Chest x-ray done  with similar appearance with prior studies with findings consistent with mild pulmonary edema or multifocal infection.  No shortness of breath.  Patient was placed empirically on IV antibiotics with IV Zosyn pending blood culture results. Pneumonia was suspected but ruled out.  Patient does not have symptoms. Stop IV antibiotics.  Blood cultures are negative. His procalcitonin is higher but that could be due to renal failure issues.  We will continue monitoring off antibiotic. WBC count is getting better.  Hypertension-blood pressures was on the lower side last night -Decreased dose on Coreg, olmesartan, amlodipine, HCTZ  Type 2 diabetes-blood sugars controlled  -SSI  Hyperlipidemia-stable -Continue home statin  BPH-stable -Continue home Flomax  DVT prophylaxis- patient started on Lovenox already.  Also has SCD in place.     All the records are reviewed and case discussed with Care Management/Social Worker. Management plans discussed with the patient, family and they are in agreement.  CODE STATUS: Full Code  TOTAL TIME TAKING CARE OF THIS PATIENT: 35 minutes.   More than 50% of the time was spent in counseling/coordination of care: YES  POSSIBLE D/C IN 1-2 DAYS, DEPENDING ON CLINICAL CONDITION.   Vaughan Basta M.D on 05/20/2018 at 2:40 PM  Between 7am to 6pm - Pager - [phone]  After 6pm go to [email or web address] - Technical brewer [place] [facility]  Office  [phone]  CC: Primary care physician; Steele Sizer, MD  Note: This dictation was prepared with Dragon dictation along with smaller phrase technology. Any transcriptional errors that result from this process are unintentional.

## 2018-05-20 NOTE — Progress Notes (Addendum)
Subjective: 4 Days Post-Op Procedure(s) (LRB): CANNULATED HIP PINNING (Right) Patient reports pain as mild.   Patient is well, and has had no acute complaints or problems Denies any CP, SOB, ABD pain. We will continue therapy today.    Objective: Vital signs in last 24 hours: Temp:  [97.5 F (36.4 C)-98.3 F (36.8 C)] 97.5 F (36.4 C) (04/27 0752) Pulse Rate:  [74-117] 117 (04/27 0752) Resp:  [17-26] 26 (04/27 0752) BP: (114-127)/(62-75) 119/75 (04/27 0752) SpO2:  [92 %-98 %] 98 % (04/27 0752) Weight:  [105.7 kg] 105.7 kg (04/27 0515)  Intake/Output from previous day: 04/26 0701 - 04/27 0700 In: -  Out: 1610 [Urine:1450] Intake/Output this shift: No intake/output data recorded.  Recent Labs    05/18/18 0449 05/19/18 0534 05/20/18 0436  HGB 8.6* 9.9* 10.4*   Recent Labs    05/19/18 0534 05/20/18 0436  WBC 8.8 7.6  RBC 3.49* 3.64*  HCT 30.9* 32.0*  PLT 167 188   Recent Labs    05/18/18 0449 05/20/18 0436  NA 138 136  K 4.6 4.9  CL 100 100  CO2 25 25  BUN 54* 52*  CREATININE 6.18* 6.41*  GLUCOSE 141* 139*  CALCIUM 8.2* 8.8*   No results for input(s): LABPT, INR in the last 72 hours.  EXAM General - Patient is Alert, Appropriate and Oriented Extremity - Dorsiflexion/Plantar flexion intact No cellulitis present Compartment soft - homans sign Dressing - dressing C/D/I and no drainage Motor Function - intact, moving foot and toes well on exam.   Past Medical History:  Diagnosis Date  . Acute metabolic encephalopathy 96/04/5407   Per my chart  . Arthritis   . Chronic airway obstruction, not elsewhere classified    worked in [employer] and was exposed to [employer]  . 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 Holley Raring, nephrologist.  on dialysis tu - thur - sat  . Depressive disorder, not elsewhere classified   . Dialysis patient [facility]) 01/2018   dialysis tuesday - thursday - saturday  . Gout   . Gout   . Heart  murmur    followed by dr. Clayborn Bigness  . Hyperlipidemia   . Hypertension   . Hypertrophy of prostate without urinary obstruction and other lower urinary tract symptoms (LUTS)   . Impotence of organic origin   . Microalbuminuria   . 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 Obesity, unspecified   . Plantar wart   . Sleep apnea    years ago but never used a cpap  . Synovitis and tenosynovitis, unspecified   . Type II or unspecified type diabetes mellitus without mention of complication, uncontrolled   . Unspecified venous (peripheral) insufficiency   . UTI (urinary tract infection)    frequent with ESBL    Assessment/Plan:   4 Days Post-Op Procedure(s) (LRB): CANNULATED HIP PINNING (Right) Active Problems:   Closed right hip fracture (HCC)  Estimated body mass index is 27.63 kg/m as calculated from the following:   Height as of this encounter: 6\' 5"  (1.956 m).   Weight as of this encounter: 105.7 kg. Advance diet Up with therapy, WBAT RLE Pain well controlled Hgb stable Tachycardia - No CP/SOB. O2 sats NL. - homans sign. Afebrile. Internal medicine following.  Follow up with [place] ortho in 2 weeks for staple removal TED hose BLE x 6 weeks    DVT Prophylaxis - TED hose and SCDs, Heparin Weight-Bearing as tolerated to right leg  Ronney Asters, PA-C [place] 05/20/2018, 9:11 AM

## 2018-05-20 NOTE — NC FL2 (Signed)
[place] LEVEL OF CARE SCREENING TOOL     IDENTIFICATION  Patient Name: Joseph Newman Birthdate: [birth year] Sex: male Admission Date (Current Location): 05/15/2018  [place] and [place] Number:  Engineering geologist and Address:  [facility], [address], [place], [place] [address]      Provider Number: [id number]  Attending Physician Name and Address:  Vaughan Basta, *  Relative Name and Phone Number:  Janson Lamar (wife) [phone]    Current Level of Care: Hospital Recommended Level of Care: [place] Prior Approval Number: [id number]  Date Approved/Denied: 09/26/17 PASRR Number:    Discharge Plan: SNF    Current Diagnoses: Patient Active Problem List   Diagnosis Date Noted  . Closed right hip fracture ([place]) 05/15/2018  . 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 End stage renal disease ([place]) 12/31/2017  . Chronic gouty arthritis 10/22/2017  . Hyperlipidemia associated with type 2 diabetes mellitus ([place]) 09/06/2017  . Chronic kidney disease (CKD), stage V ([place]) 07/26/2017  . Dyslipidemia associated with type 2 diabetes mellitus ([place]) 03/13/2016  . Diabetic ulcer of toe of right foot associated with type 2 diabetes mellitus, limited to breakdown of skin ([place]) 03/07/2016  . Type 2 diabetes mellitus with diabetic nephropathy, without long-term current use of insulin ([place]) 06/17/2015  . Microscopic hematuria 05/30/2015  . 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 Dyslipidemia 08/31/2014  . Diabetes mellitus with renal manifestation ([place]) 08/31/2014  . COPD (chronic obstructive pulmonary disease) ([place]) 08/31/2014  . Microalbuminuria 08/31/2014  . ED (erectile dysfunction) 08/31/2014  . Hypertension     Orientation RESPIRATION BLADDER Height & Weight     Self, Place  Normal Incontinent Weight: 233 lb (105.7 kg) Height:  6\' 5"  (195.6 cm)  BEHAVIORAL  SYMPTOMS/MOOD NEUROLOGICAL BOWEL NUTRITION STATUS      Incontinent Diet(carb modified)  AMBULATORY STATUS COMMUNICATION OF NEEDS Skin   Extensive Assist Verbally Surgical wounds(closed incision right hip)                       Personal Care Assistance Level of Assistance  Bathing, Feeding, Dressing Bathing Assistance: Maximum assistance Feeding assistance: Limited assistance Dressing Assistance: Maximum assistance     Functional Limitations Info  Sight, Hearing, Speech Sight Info: Adequate Hearing Info: Adequate Speech Info: Adequate    SPECIAL CARE FACTORS FREQUENCY  PT (By licensed PT), OT (By licensed OT)     PT Frequency: 5 times week OT Frequency: 3 times week            Contractures Contractures Info: Not present    Additional Factors Info  Code Status, Allergies, Insulin Sliding Scale Code Status Info: full Allergies Info: NKA   Insulin Sliding Scale Info: see dc summary       Current Medications (05/20/2018):  This is the current hospital active medication list Current Facility-Administered Medications  Medication Dose Route Frequency Provider Last Rate Last Dose  . acetaminophen (TYLENOL) tablet 650 mg  650 mg Oral Q6H PRN Hessie Knows, MD       Or  . acetaminophen (TYLENOL) suppository 650 mg  650 mg Rectal Q6H PRN Hessie Knows, MD      . allopurinol (ZYLOPRIM) tablet 50 mg  50 mg Oral Ceasar Lund, MD   50 mg at 05/20/18 0910  . alum & mag hydroxide-simeth (MAALOX/MYLANTA) 200-200-20 MG/5ML suspension 30 mL  30 mL Oral Q4H PRN Hessie Knows, MD      .  aspirin EC tablet 81 mg  81 mg Oral Daily Hessie Knows, MD   81 mg at 05/20/18 0910  . atorvastatin (LIPITOR) tablet 40 mg  40 mg Oral Daily Hessie Knows, MD   40 mg at 05/20/18 0910  . bisacodyl (DULCOLAX) suppository 10 mg  10 mg Rectal Daily PRN Hessie Knows, MD      . calcium acetate (PHOSLO) capsule 1,334 mg  1,334 mg Oral TID WC Murlean Iba, MD   1,334 mg at 05/20/18 1334  .  carvedilol (COREG) tablet 3.125 mg  3.125 mg Oral BID WC Vaughan Basta, MD   3.125 mg at 05/20/18 0910  . Chlorhexidine Gluconate Cloth 2 % PADS 6 each  6 each Topical Q0600 Hessie Knows, MD   6 each at 05/20/18 0505  . docusate sodium (COLACE) capsule 100 mg  100 mg Oral BID Hessie Knows, MD   100 mg at 05/19/18 6283  . epoetin alfa (EPOGEN) injection 4,000 Units  4,000 Units Intravenous Q T,Th,Sa-HD Murlean Iba, MD      . furosemide (LASIX) injection 40 mg  40 mg Intravenous BID Murlean Iba, MD   40 mg at 05/20/18 1338  . heparin injection 5,000 Units  5,000 Units Subcutaneous Q8H Vaughan Basta, MD   5,000 Units at 05/20/18 1334  . insulin aspart (novoLOG) injection 0-5 Units  0-5 Units Subcutaneous QHS Hessie Knows, MD      . insulin aspart (novoLOG) injection 0-9 Units  0-9 Units Subcutaneous TID WC Hessie Knows, MD   5 Units at 05/20/18 1224  . magnesium citrate solution 1 Bottle  1 Bottle Oral Once PRN Hessie Knows, MD      . magnesium hydroxide (MILK OF MAGNESIA) suspension 30 mL  30 mL Oral Daily PRN Hessie Knows, MD      . menthol-cetylpyridinium (CEPACOL) lozenge 3 mg  1 lozenge Oral PRN Hessie Knows, MD       Or  . phenol (CHLORASEPTIC) mouth spray 1 spray  1 spray Mouth/Throat PRN Hessie Knows, MD      . morphine 2 MG/ML injection 2 mg  2 mg Intravenous Q3H PRN Hessie Knows, MD   2 mg at 05/17/18 0050  . mupirocin ointment (BACTROBAN) 2 % 1 application  1 application Nasal BID Hessie Knows, MD   1 application at 15/17/61 [phone]  . ondansetron (ZOFRAN) tablet 4 mg  4 mg Oral Q6H PRN Hessie Knows, MD       Or  . ondansetron [facility]) injection 4 mg  4 mg Intravenous Q6H PRN Hessie Knows, MD      . oxyCODONE (Oxy IR/ROXICODONE) immediate release tablet 5 mg  5 mg Oral Q4H PRN Reche Dixon, PA-C   5 mg at 05/17/18 1327  . polyethylene glycol (MIRALAX / GLYCOLAX) packet 17 g  17 g Oral Daily PRN Hessie Knows, MD      . tamsulosin [facility]) capsule 0.4 mg   0.4 mg Oral QPC breakfast Hessie Knows, MD   0.4 mg at 05/20/18 0910  . traMADol (ULTRAM) tablet 50 mg  50 mg Oral Q6H PRN Reche Dixon, PA-C   50 mg at 05/17/18 7106     Discharge Medications: Please see discharge summary for a list of discharge medications.  Relevant Imaging Results:  Relevant Lab Results:   Additional Information Pt has outpatient HD MWF  Shade Flood, LCSW

## 2018-05-20 NOTE — Evaluation (Signed)
Occupational Therapy Evaluation Patient Details Name: Joseph Newman MRN: [id number] DOB: 1945-11-02 Today's Date: 05/20/2018    History of Present Illness Pt is 73 yo male s/p cannulated hip pinning after a fall at home. PMH of COPD, HTN, renal disease, HLD, DM   Clinical Impression   Pt seen for OT evaluation and co-treatment with PT. Pt initially sleepy but wakes to verbal cues and agreeable to session. Pt denies pain with no movement, up to 10/10 in R hip with any bed mobility, ultimately requiring total A x2 for sup>sit and max A x2 for sit>sup. Pt able to tolerate sitting EOB with mostly supervision for sitting balance and UEx1 support on EOB. Pt able to shift UE support during UB dressing and grooming tasks, requiring Min A to Max A. Pt unable to maintain optimal posture in sitting 2/2 fatigue and pain. Pt with inconsistent command follow, cueing verbally and tactily for task orientation and sequencing. RN notified that pt's O2 nasal cannula was removed at start of session, placed back on pt at end of session at 4L O2. No visual signs of distress and pt denied SOB throughout session. Unable to get accurate O2 sats reading 2/2 pt's cold hands. Pt will benefit from skilled OT services to maximize return to PLOF, minimize falls risk, and minimize caregiver burden. Recommend STR.     Follow Up Recommendations  SNF    Equipment Recommendations  3 in 1 bedside commode    Recommendations for Other Services       Precautions / Restrictions Precautions Precautions: Fall Restrictions Weight Bearing Restrictions: Yes RLE Weight Bearing: Weight bearing as tolerated Other Position/Activity Restrictions: limited ability to assist with transfers      Mobility Bed Mobility Overal bed mobility: Needs Assistance Bed Mobility: Supine to Sit;Sit to Supine     Supine to sit: +2 for physical assistance;Total assist Sit to supine: Max assist;+2 for physical assistance   General bed mobility  comments: X2 for positioning patient in bed. Sitting EOB x10 minutes, HR maintained within 117-120 range, once positioned able to maintain seated with Min and SBA  Transfers                 General transfer comment: unsafe to attempt    Balance Overall balance assessment: Needs assistance Sitting-balance support: Feet supported;Single extremity supported Sitting balance-Leahy Scale: Fair Sitting balance - Comments: Patient SBA-MinA with SUE support seated EOB ~10 minutes with dressing .  Postural control: Left lateral lean(weight shift off RLE)   Standing balance-Leahy Scale: Zero                             ADL either performed or assessed with clinical judgement   ADL Overall ADL's : Needs assistance/impaired Eating/Feeding: Sitting;Minimal assistance   Grooming: Sitting;Maximal assistance Grooming Details (indicate cue type and reason): pt barely able to lift UE for OT to apply deodorant, requiring pt to use 1 UE for sitting balance support Upper Body Bathing: Sitting;Moderate assistance;Maximal assistance   Lower Body Bathing: Sitting/lateral leans;Maximal assistance   Upper Body Dressing : Sitting;Moderate assistance;Maximal assistance   Lower Body Dressing: Sitting/lateral leans;Maximal assistance   Toilet Transfer: Total assistance;+2 for physical assistance                   Vision Patient Visual Report: No change from baseline       Perception     Praxis      Pertinent Vitals/Pain Pain  Assessment: 0-10 Pain Score: 10-Worst pain ever Pain Location: patient reports 10/10 pain with transfers and sitting EOB in R hip  Pain Descriptors / Indicators: Aching;Grimacing;Guarding Pain Intervention(s): Limited activity within patient's tolerance;Monitored during session;Repositioned     Hand Dominance Right   Extremity/Trunk Assessment Upper Extremity Assessment Upper Extremity Assessment: Generalized weakness;LUE deficits/detail LUE  Deficits / Details: guttering of hypothenar muscles, particularly between thumb and 1st finger   Lower Extremity Assessment Lower Extremity Assessment: Generalized weakness;RLE deficits/detail RLE Deficits / Details: significantly limited 2/2 hip fx and pain RLE: Unable to fully assess due to pain       Communication Communication Communication: No difficulties   Cognition Arousal/Alertness: Awake/alert Behavior During Therapy: Flat affect Overall Cognitive Status: No family/caregiver present to determine baseline cognitive functioning                                 General Comments: inconsistent command follow, cueing verbally and tactily for task orientation and sequencing.    General Comments  noted bruising of back on L side and atrophy of L hand musculature    Exercises Other Exercises Other Exercises: supine <>sit total x 2 with OT postioning Upper body and cueing for positioning of arms and hands for attempt of patient assist, PT assisting with LE's for positioning and hip precaution safety.  Other Exercises: Sitting EOB: ~10 minutes, OT assisting with education and performance of gown change and self dressing with UE movements, PT assisting in trunk stability, proper alignment of hips to neutralize weight shift for equal weightbearing and trunk positioning. Cueing required for use of UE's with both verbal and tactile assistance. Patient Min A for dressing and Max A for grooming. Inconsistant command follow with max cueing for task orientation.    Shoulder Instructions      Home Living Family/patient expects to be discharged to:: Private residence Living Arrangements: Spouse/significant other Available Help at Discharge: Family Type of Home: House Home Access: Stairs to enter Technical brewer of Steps: 2-3 Entrance Stairs-Rails: Right Home Layout: One level     Bathroom Shower/Tub: Teacher, early years/pre: Standard     Home Equipment:  Environmental consultant - 2 wheels          Prior Functioning/Environment Level of Independence: Needs assistance  Gait / Transfers Assistance Needed: Pt stated he needed assistance to ambulate with his walker ADL's / Homemaking Assistance Needed: Reported he needs assistance with dressing, bathing, feeding, dependent for IADLs            OT Problem List: Decreased strength;Decreased range of motion;Decreased coordination;Decreased knowledge of use of DME or AE;Decreased activity tolerance;Decreased cognition;Cardiopulmonary status limiting activity;Impaired UE functional use;Pain;Impaired balance (sitting and/or standing)      OT Treatment/Interventions: Self-care/ADL training;Balance training;Therapeutic exercise;Therapeutic activities;Cognitive remediation/compensation;DME and/or AE instruction;Patient/family education    OT Goals(Current goals can be found in the care plan section) Acute Rehab OT Goals Patient Stated Goal: to decrease pain OT Goal Formulation: With patient Time For Goal Achievement: 06/03/18 Potential to Achieve Goals: Good ADL Goals Pt Will Perform Grooming: with min guard assist;sitting Pt Will Perform Upper Body Dressing: with min guard assist;sitting Pt Will Transfer to Toilet: with +2 assist;with max assist;with transfer board;bedside commode  OT Frequency: Min 2X/week   Barriers to D/C: Decreased caregiver support          Co-evaluation   Reason for Co-Treatment: Complexity of the patient's impairments (multi-system involvement);To address functional/ADL transfers  PT goals addressed during session: Mobility/safety with mobility;Balance;Strengthening/ROM OT goals addressed during session: ADL's and self-care;Strengthening/ROM      AM-PAC OT "6 Clicks" Daily Activity     Outcome Measure Help from another person eating meals?: A Little Help from another person taking care of personal grooming?: A Lot Help from another person toileting, which includes using  toliet, bedpan, or urinal?: Total Help from another person bathing (including washing, rinsing, drying)?: A Lot Help from another person to put on and taking off regular upper body clothing?: A Lot Help from another person to put on and taking off regular lower body clothing?: A Lot 6 Click Score: 12   End of Session Nurse Communication: Mobility status;Other (comment)(nasal canula was off at start of session, on at end, unable to get good O2 sats reading )  Activity Tolerance: Patient tolerated treatment well Patient left: in bed;with call bell/phone within reach;with bed alarm set;with SCD's reapplied  OT Visit Diagnosis: Other abnormalities of gait and mobility (R26.89);Muscle weakness (generalized) (M62.81);Pain Pain - Right/Left: Right Pain - part of body: Hip                Time: 1281-1886 OT Time Calculation (min): 42 min Charges:  OT General Charges $OT Visit: 1 Visit OT Evaluation $OT Eval Moderate Complexity: 1 Mod OT Treatments $Self Care/Home Management : 8-22 mins  Jeni Salles, MPH, MS, OTR/L ascom [phone] 05/20/18, 12:03 PM

## 2018-05-20 NOTE — Progress Notes (Signed)
PT Cancellation Note  Patient Details Name: Joseph Newman MRN: [id number] DOB: [birth year]   Cancelled Treatment:    Reason Eval/Treat Not Completed: Other (comment)(patient eating breakfast) Patient eating breakfast upon PT arrival, will return at later time/date.   Janna Arch, PT, DPT   05/20/2018, 9:35 AM

## 2018-05-21 LAB — GLUCOSE, CAPILLARY
Glucose-Capillary: 165 mg/dL — ABNORMAL HIGH (ref 70–99)
Glucose-Capillary: 198 mg/dL — ABNORMAL HIGH (ref 70–99)
Glucose-Capillary: 160 mg/dL — ABNORMAL HIGH (ref 70–99)
Glucose-Capillary: 173 mg/dL — ABNORMAL HIGH (ref 70–99)

## 2018-05-21 LAB — CULTURE, BLOOD (ROUTINE X 2)
Culture: NO GROWTH
Culture: NO GROWTH
Special Requests: ADEQUATE
Special Requests: ADEQUATE

## 2018-05-21 LAB — SARS CORONAVIRUS 2 BY RT PCR (HOSPITAL ORDER, PERFORMED IN ~~LOC~~ HOSPITAL LAB): SARS Coronavirus 2: NEGATIVE

## 2018-05-21 MED ORDER — POLYETHYLENE GLYCOL 3350 17 G PO PACK: 17.0000 g | PACK | Freq: Every day | ORAL | 0 refills | Status: DC | PRN

## 2018-05-21 MED ORDER — CARVEDILOL 6.25 MG PO TABS: 3.1250 mg | ORAL_TABLET | Freq: Two times a day (BID) | ORAL | 0 refills | Status: DC

## 2018-05-21 NOTE — Progress Notes (Signed)
Post HD assessment    05/21/18 1650  Neurological  Level of Consciousness Alert  Orientation Level Oriented X4  Respiratory  Respiratory Pattern Regular;Unlabored  Chest Assessment Chest expansion symmetrical  Bilateral Breath Sounds Clear;Diminished  Cough None  Cardiac  Pulse Regular  Heart Sounds No adventitious heart sounds  ECG Monitor Yes  Cardiac Rhythm ST  Vascular  R Radial Pulse +2  L Radial Pulse +2  Edema Generalized  Generalized Edema +1  Psychosocial  Psychosocial (WDL) WDL  Patient Behaviors Calm;Cooperative  Emotional support given Given to patient

## 2018-05-21 NOTE — TOC Progression Note (Signed)
Transition of Care [facility]) - Progression Note    Patient Details  Name: Joseph Newman MRN: [id number] Date of Birth: 10/08/1945  Transition of Care [facility]) CM/SW [place], RN Phone Number: 05/21/2018, 1:56 PM  Clinical Narrative:     Damaris Schooner to [facility] and the admissions coordinator, she stated that they can offer the bed if the patient would agree to do dialysis in [place], I spoke with [name] and she agreed that they could do Dialysis in [place], [employer] of the choice and notified the physician that they will not have a bed until tomorrow   Expected Discharge Plan: [place] Barriers to Discharge: Continued Medical Work up  Expected Discharge Plan and Services Expected Discharge Plan: [place] arrangements for the past 2 months: Single Family Home Expected Discharge Date: 05/21/18                           [facility] Agency: [facility] Date [place]: 05/17/18 Time [place]: 1201 Representative spoke with at [place] ([place]) Interventions    Readmission Risk Interventions Readmission Risk Prevention Plan 05/17/2018  Transportation Screening Complete  Medication Review Press photographer) Complete  HRI or [place] Patient Refused  Some recent data might be hidden

## 2018-05-21 NOTE — Progress Notes (Signed)
Pre HD Assessment    05/21/18 1300  Neurological  Level of Consciousness Alert  Orientation Level Oriented X4  Respiratory  Respiratory Pattern Regular;Unlabored  Chest Assessment Chest expansion symmetrical  Bilateral Breath Sounds Diminished  Cough None  Cardiac  Pulse Regular  Heart Sounds No adventitious heart sounds  ECG Monitor Yes  Cardiac Rhythm ST  Vascular  R Radial Pulse +2  L Radial Pulse +2  Edema Generalized  Generalized Edema +1  Psychosocial  Psychosocial (WDL) WDL  Patient Behaviors Calm;Cooperative  Emotional support given Given to patient

## 2018-05-21 NOTE — Progress Notes (Signed)
[facility] [facility] [place] NAME: Chaddrick Brue    MR#:  [id number]  DATE OF BIRTH:  [birth year]  SUBJECTIVE:  CHIEF COMPLAINT:   Chief Complaint  Patient presents with  . Fall   No new complaint this morning.  Worked with physical therapy.  Complain of pain with movements.  He is requiring 3 ltr oxygen , but not in any discomfort today.  REVIEW OF SYSTEMS:  Review of Systems  Constitutional: Negative for chills and fever.  HENT: Negative for hearing loss and tinnitus.   Eyes: Negative for blurred vision and double vision.  Respiratory: Negative for cough and hemoptysis.   Cardiovascular: Negative for chest pain and palpitations.  Gastrointestinal: Negative for heartburn and nausea.  Genitourinary: Negative for dysuria and urgency.  Musculoskeletal: Negative for myalgias and neck pain.  Skin: Negative for itching and rash.  Neurological: Negative for dizziness and headaches.  Psychiatric/Behavioral: Negative for depression and hallucinations.    DRUG ALLERGIES:  No Known Allergies VITALS:  Blood pressure 102/71, pulse (!) 131, temperature 98.1 F (36.7 C), temperature source Oral, resp. rate (!) 28, height 6\' 5"  (1.956 m), weight 112.9 kg, SpO2 99 %. PHYSICAL EXAMINATION:   Physical Exam  Constitutional: He is oriented to person, place, and time. He appears well-developed and well-nourished.  HENT:  Head: Normocephalic and atraumatic.  Right Ear: External ear normal.  Eyes: Pupils are equal, round, and reactive to light. Conjunctivae are normal. Right eye exhibits no discharge.  Neck: Normal range of motion. No thyromegaly present.  Cardiovascular: Normal rate, regular rhythm and normal heart sounds.  Respiratory: Effort normal. No respiratory distress. He has no wheezes. He has rales.  GI: Soft. Bowel sounds are normal. He exhibits no distension.  Musculoskeletal: Normal range of motion.        General: No edema.  Neurological: He is  alert and oriented to person, place, and time.  Generalized weakness  Skin: Skin is warm. He is not diaphoretic. No erythema.   LABORATORY PANEL:  Male CBC Recent Labs  Lab 05/20/18 0436  WBC 7.6  HGB 10.4*  HCT 32.0*  PLT 188   ------------------------------------------------------------------------------------------------------------------ Chemistries  Recent Labs  Lab 05/15/18 1705  05/18/18 0449 05/20/18 0436  NA 135   < > 138 136  K 5.2*   < > 4.6 4.9  CL 96*   < > 100 100  CO2 25   < > 25 25  GLUCOSE 146*   < > 141* 139*  BUN 41*   < > 54* 52*  CREATININE 5.23*   < > 6.18* 6.41*  CALCIUM 8.4*   < > 8.2* 8.8*  MG  --    < > 2.0  --   AST 19  --   --   --   ALT 13  --   --   --   ALKPHOS 87  --   --   --   BILITOT 0.9  --   --   --    < > = values in this interval not displayed.   RADIOLOGY:  No results found. ASSESSMENT AND PLAN:   Right hip fracture-s/p mechanical fall Patient already seen by orthopedic service on day 3 postop Pains appear controlled. Physical therapist at bedside evaluating patient.  Anticipate discharge to skilled nursing facility after the weekend. Patient and his wife were initially refusing to go to a rehab facility and wanted to go home but today they agreed for  the bed search. Nursing home has accepted him but they wanted a COVID-19 test result before admission which was done and negative. They do not have any bed or room available today.  Requesting to transfer tomorrow.  ESRD on HD TTS- chest x-ray with mild pulmonary edema.  Patient on 2 L O2.  Wean off oxygen as tolerated. Nephrology service following.  Fevers;  Temperature of 100.6 postoperatively. Patient has remained afebrile since 2 days.  Chest x-ray done  with similar appearance with prior studies with findings consistent with mild pulmonary edema or multifocal infection.  No shortness of breath.  Patient was placed empirically on IV antibiotics with IV Zosyn pending blood  culture results. Pneumonia was suspected but ruled out.  Patient does not have symptoms. Stop IV antibiotics.  Blood cultures are negative. His procalcitonin is higher but that could be due to renal failure issues.  We will continue monitoring off antibiotic. WBC count is getting better.  Hypertension-blood pressures was on the lower side last night -Decreased dose on Coreg, stop olmesartan, amlodipine, HCTZ  Type 2 diabetes-blood sugars controlled  -SSI  Hyperlipidemia-stable -Continue home statin  BPH-stable -Continue home Flomax  DVT prophylaxis- patient started on Lovenox already.  Also has SCD in place.     All the records are reviewed and case discussed with Care Management/Social Worker. Management plans discussed with the patient, family and they are in agreement.  CODE STATUS: Full Code  TOTAL TIME TAKING CARE OF THIS PATIENT: 35 minutes.   More than 50% of the time was spent in counseling/coordination of care: YES  POSSIBLE D/C IN 1-2 DAYS, DEPENDING ON CLINICAL CONDITION.   Vaughan Basta M.D on 05/21/2018 at 3:47 PM  Between 7am to 6pm - Pager - [phone]  After 6pm go to [email or web address] - Technical brewer [facility]  Office  [phone]  CC: Primary care physician; Steele Sizer, MD  Note: This dictation was prepared with Dragon dictation along with smaller phrase technology. Any transcriptional errors that result from this process are unintentional.

## 2018-05-21 NOTE — Progress Notes (Signed)
HD Tx completed, no  UF as ordered, HR still elevated pot tx, primary and nephrology notified, Pt stable    05/21/18 1645  Vital Signs  Pulse Rate (!) 125  Pulse Rate Source Monitor  Resp (!) 21  BP 95/60  BP Location Left Arm  BP Method Automatic  Patient Position (if appropriate) Lying  Oxygen Therapy  SpO2 98 %  O2 Device Room Air  During Hemodialysis Assessment  HD Safety Checks Performed Yes  KECN 58.6 KECN  Dialysis Fluid Bolus Normal Saline  Bolus Amount (mL) 250 mL  Intra-Hemodialysis Comments Tx completed;Tolerated well

## 2018-05-21 NOTE — TOC Progression Note (Signed)
Transition of Care [facility]) - Progression Note    Patient Details  Name: Joseph Newman MRN: [id number] Date of Birth: 05-17-1945  Transition [facility]) CM/SW Contact  Su Hilt, RN Phone Number: 05/21/2018, 12:03 PM  Clinical Narrative:    Called and left a secure VM for [facility] Admissions, requested confirmation that they could take the patient to the dialysis 3 times per week tue thur and sat.  The daughter [name] has stated if they can provide the transportation to dialysis then they would accept the bed Gave this CM's contact information and requested a call back ASAP   Expected Discharge Plan: [place] Barriers to Discharge: Continued Medical Work up  Expected Discharge Plan and Services Expected Discharge Plan: [place] Choice: [place] arrangements for the past 2 months: [place]: [facility] Date [place]: 05/17/18 Time [place]: 1201 Representative spoke with at [place] Determinants of Health ([place]) Interventions    Readmission Risk Interventions Readmission Risk Prevention Plan 05/17/2018  Transportation Screening Complete  Medication Review Press photographer) Complete  HRI or [place] Patient Refused  Some recent data might be hidden

## 2018-05-21 NOTE — Progress Notes (Signed)
Pre HD Tx    05/21/18 1336  Vital Signs  Pulse Rate (!) 122  Pulse Rate Source Monitor  Resp (!) 24  BP 126/70  BP Location Left Arm  BP Method Automatic  Patient Position (if appropriate) Lying  Oxygen Therapy  SpO2 100 %  O2 Device Nasal Cannula  O2 Flow Rate (L/min) 3 L/min  Pulse Oximetry Type Continuous  During Hemodialysis Assessment  Blood Flow Rate (mL/min) 400 mL/min  Arterial Pressure (mmHg) -230 mmHg  Venous Pressure (mmHg) 230 mmHg  Transmembrane Pressure (mmHg) 50 mmHg  Ultrafiltration Rate (mL/min) 670 mL/min  Dialysate Flow Rate (mL/min) 800 ml/min  Conductivity: Machine  13.8  HD Safety Checks Performed Yes  Dialysis Fluid Bolus Normal Saline  Bolus Amount (mL) 250 mL  Intra-Hemodialysis Comments Tx initiated  Fistula / Graft Right Upper arm Arteriovenous fistula  Placement Date: 05/15/18   Placed prior to admission: Yes  Orientation: Right  Access Location: Upper arm  Access Type: Arteriovenous fistula  Status Accessed  Needle Size 16g  Drainage Description None

## 2018-05-21 NOTE — Progress Notes (Signed)
[place], [place] 05/21/18  Subjective:  Patient due for dialysis treatment today. Resting comfortably in bed at the moment.    Objective:  Vital signs in last 24 hours:  Temp:  [97.6 F (36.4 C)-98.5 F (36.9 C)] 97.6 F (36.4 C) (04/28 0741) Pulse Rate:  [115-120] 115 (04/28 1033) Resp:  [21-24] 24 (04/28 0350) BP: (117-137)/(61-82) 124/74 (04/28 1033) SpO2:  [89 %-99 %] 97 % (04/28 0741) Weight:  [112.9 kg] 112.9 kg (04/28 0351)  Weight change: 7.258 kg Filed Weights   05/19/18 0512 05/20/18 0515 05/21/18 0351  Weight: 114.8 kg 105.7 kg 112.9 kg    Intake/Output:    Intake/Output Summary (Last 24 hours) at 05/21/2018 1118 Last data filed at 05/21/2018 0536 Gross per 24 hour  Intake -  Output 950 ml  Net -950 ml     Physical Exam: General:  Chronically ill-appearing, laying in the bed  HEENT  anicteric, moist oral mucous membranes  Neck  supple  Pulm/lungs  normal breathing effort, mild crackles at bases b/l  CVS/Heart  irregular, no rub  Abdomen:   Soft, nontender  Extremities:  Trace peripheral edema  Neurologic:  awake, alert, follows commands  Skin:  No acute rashes  Access:  Right arm AV fistula, good bruit       Basic Metabolic Panel:  Recent Labs  Lab 05/15/18 1705 05/16/18 0255 05/16/18 1647 05/17/18 0539 05/18/18 0449 05/20/18 0436  NA 135 136 138 138 138 136  K 5.2* 4.6 4.1 4.4 4.6 4.9  CL 96* 100  --  98 100 100  CO2 25 23  --  24 25 25   GLUCOSE 146* 145* 123* 155* 141* 139*  BUN 41* 44*  --  40* 54* 52*  CREATININE 5.23* 5.62*  --  5.30* 6.18* 6.41*  CALCIUM 8.4* 8.5*  --  8.1* 8.2* 8.8*  MG  --   --   --  1.9 2.0  --   PHOS  --   --   --  5.2* 6.3*  --      CBC: Recent Labs  Lab 05/15/18 1705 05/16/18 0255 05/16/18 1647 05/17/18 0539 05/18/18 0449 05/19/18 0534 05/20/18 0436  WBC 7.6 7.7  --  9.5 12.1* 8.8 7.6  NEUTROABS 5.2  --   --   --   --   --   --   HGB 11.8* 11.5* 10.9* 11.6*  8.6* 9.9* 10.4*  HCT 35.9* 35.2* 32.0* 35.4* 27.0* 30.9* 32.0*  MCV 86.3 86.9  --  86.3 88.2 88.5 87.9  PLT 209 209  --  188 190 167 188      Lab Results  Component Value Date   HEPBSAG Negative 09/27/2017      Microbiology:  Recent Results (from the past 240 hour(s))  Surgical PCR screen     Status: Abnormal   Collection Time: 05/15/18 10:05 PM  Result Value Ref Range Status   MRSA, PCR POSITIVE (A) NEGATIVE Final    Comment: RESULT CALLED TO, READ BACK BY AND VERIFIED WITH: Kathyrn Drown RN [phone] 05/15/2018 HNM    Staphylococcus aureus POSITIVE (A) NEGATIVE Final    Comment: (NOTE) The Xpert SA Assay (FDA approved for NASAL specimens in patients 65 years of age and older), is one component of a comprehensive surveillance program. It is not intended to diagnose infection nor to guide or monitor treatment. Performed at [facility], [address] [place], [place] [address]   CULTURE, BLOOD (ROUTINE X 2)  w Reflex to ID Panel     Status: None   Collection Time: 05/16/18 10:22 AM  Result Value Ref Range Status   Specimen Description BLOOD LEFT ARM  Final   Special Requests   Final    BOTTLES DRAWN AEROBIC AND ANAEROBIC Blood Culture adequate volume   Culture   Final    NO GROWTH 5 DAYS Performed at [facility], [address] [place], [place] [address]    Report Status 05/21/2018 FINAL  Final  CULTURE, BLOOD (ROUTINE X 2) w Reflex to ID Panel     Status: None   Collection Time: 05/16/18 10:29 AM  Result Value Ref Range Status   Specimen Description BLOOD LEFT ANTECUBITAL  Final   Special Requests   Final    BOTTLES DRAWN AEROBIC AND ANAEROBIC Blood Culture adequate volume   Culture   Final    NO GROWTH 5 DAYS Performed at [facility], [address], [place], [place] [address]    Report Status 05/21/2018 FINAL  Final    Coagulation Studies: No results for input(s): LABPROT, INR in the last 72 hours.  Urinalysis: No results  for input(s): COLORURINE, LABSPEC, PHURINE, GLUCOSEU, HGBUR, BILIRUBINUR, KETONESUR, PROTEINUR, UROBILINOGEN, NITRITE, LEUKOCYTESUR in the last 72 hours.  Invalid input(s): APPERANCEUR    Imaging: Dg Chest 1 View  Result Date: 05/20/2018 CLINICAL DATA:  Hypoxia. EXAM: CHEST  1 VIEW COMPARISON:  One-view chest x-ray 05/18/2018 FINDINGS: Heart is enlarged. Atherosclerotic calcifications are present at the aortic arch. Diffuse interstitial and airspace pattern has increased. IMPRESSION: 1. Cardiomegaly with diffuse increased interstitial and airspace opacity. While this likely represents edema and congestive heart failure, infection is not excluded. Electronically Signed   By: San Morelle M.D.   On: 05/20/2018 08:54     Medications:    . allopurinol  50 mg Oral QODAY  . aspirin EC  81 mg Oral Daily  . atorvastatin  40 mg Oral Daily  . calcium acetate  1,334 mg Oral TID WC  . carvedilol  3.125 mg Oral BID WC  . Chlorhexidine Gluconate Cloth  6 each Topical Q0600  . docusate sodium  100 mg Oral BID  . epoetin (EPOGEN/PROCRIT) injection  4,000 Units Intravenous Q T,Th,Sa-HD  . furosemide  40 mg Intravenous BID  . heparin injection (subcutaneous)  5,000 Units Subcutaneous Q8H  . insulin aspart  0-5 Units Subcutaneous QHS  . insulin aspart  0-9 Units Subcutaneous TID WC  . tamsulosin  0.4 mg Oral QPC breakfast   acetaminophen **OR** acetaminophen, alum & mag hydroxide-simeth, bisacodyl, magnesium citrate, magnesium hydroxide, menthol-cetylpyridinium **OR** phenol, morphine injection, ondansetron **OR** ondansetron (ZOFRAN) IV, oxyCODONE, polyethylene glycol, traMADol  Assessment/ Plan:  73 y.o. African-American male with end-stage renal disease, depression, gout, BPH, history of urinary retention, followed by urologist Dr. Bernardo Heater, history of colonization of urine  Davita Graham/TTS/103.5 kg/Rt arm AVF/  1.  End-stage renal disease -Patient due for hemodialysis treatment today.   Orders have been prepared.  2.  Anemia of chronic kidney disease -Continue Epogen 4000 units IV with dialysis.  3.  Secondary hyperparathyroidism -Recheck serum phosphorus today during dialysis treatments.  Continue calcium acetate at its current dosage.  4.  Right hip fracture Underwent right hip pinning on 4/23  5. Hypoxia Continue Lasix 40 mg IV twice daily.  We will plan for dialysis as scheduled for tomorrow.   LOS: 6 Nika Yazzie 4/28/202011:18 AM  [place], [place]  Note: This note was  prepared with [employer] . Any transcription errors are unintentional

## 2018-05-21 NOTE — Progress Notes (Signed)
HD Tx started w/o complication    22/02/54 1336  Vital Signs  Pulse Rate (!) 122  Pulse Rate Source Monitor  Resp (!) 24  BP 126/70  BP Location Left Arm  BP Method Automatic  Patient Position (if appropriate) Lying  Oxygen Therapy  SpO2 100 %  O2 Device Nasal Cannula  O2 Flow Rate (L/min) 3 L/min  Pulse Oximetry Type Continuous  During Hemodialysis Assessment  Blood Flow Rate (mL/min) 400 mL/min  Arterial Pressure (mmHg) -230 mmHg  Venous Pressure (mmHg) 230 mmHg  Transmembrane Pressure (mmHg) 50 mmHg  Ultrafiltration Rate (mL/min) 670 mL/min (uf is turned off. )  Dialysate Flow Rate (mL/min) 800 ml/min  Conductivity: Machine  13.8  HD Safety Checks Performed Yes  Dialysis Fluid Bolus Normal Saline  Bolus Amount (mL) 250 mL  Intra-Hemodialysis Comments Tx initiated  Fistula / Graft Right Upper arm Arteriovenous fistula  Placement Date: 05/15/18   Placed prior to admission: Yes  Orientation: Right  Access Location: Upper arm  Access Type: Arteriovenous fistula  Status Accessed  Needle Size 16g  Drainage Description None

## 2018-05-21 NOTE — TOC Progression Note (Signed)
Transition of Care [facility]) - Progression Note    Patient Details  Name: Joseph Newman MRN: [id number] Date of Birth: July 16, 1945  [facility]) CM/SW Contact  Su Hilt, RN Phone Number: 05/21/2018, 11:12 AM  Clinical Narrative:    Spoke with the daughter [name] and offered the only bed that has been offered  [facility],  I explained that the plan would be to DC this afternoon after dialysis, I explained that the patient could not have visitors, I also explained that he could transport via EMS  She stated that she would speak to her sons and call me back with an answer   Expected Discharge Plan: [place] Barriers to Discharge: Continued Medical Work up  Expected Discharge Plan and Services Expected Discharge Plan: [place] arrangements for the past 2 months: [place]: [facility] Date [place]: 05/17/18 Time [place]: 63 Representative spoke with at [place] ([place]) Interventions    Readmission Risk Interventions Readmission Risk Prevention Plan 05/17/2018  Transportation Screening Complete  Medication Review (RN Care Manager) Complete  HRI or [place] Patient Refused  Some recent data might be hidden

## 2018-05-21 NOTE — Discharge Summary (Signed)
[facility] NAME: Joseph Newman    MR#:  [id number]  DATE OF BIRTH:  1945/07/06  DATE OF ADMISSION:  05/15/2018 ADMITTING PHYSICIAN: Sela Hua, MD  DATE OF DISCHARGE: 05/21/2018   PRIMARY CARE PHYSICIAN: Steele Sizer, MD    ADMISSION DIAGNOSIS:  ESRD (end stage renal disease) on dialysis ([place]) [N18.6, Z99.2] Acute respiratory failure with hypoxia ([place]) [J96.01] Closed fracture of right hip, initial encounter ([place]) [S72.001A]  DISCHARGE DIAGNOSIS:  Active Problems:   Closed right hip fracture ([place])   SECONDARY DIAGNOSIS:   Past Medical History:  Diagnosis Date  . Acute metabolic encephalopathy 85/46/2703   Per my chart  . Arthritis   . Chronic airway obstruction, not elsewhere classified    worked in [employer] and was exposed to [employer]  . 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 Holley Raring, nephrologist.  on dialysis tu - thur - sat  . Depressive disorder, not elsewhere classified   . Dialysis patient [facility]) 01/2018   dialysis tuesday - thursday - saturday  . Gout   . Gout   . Heart murmur    followed by dr. Clayborn Bigness  . Hyperlipidemia   . Hypertension   . Hypertrophy of prostate without urinary obstruction and other lower urinary tract symptoms (LUTS)   . Impotence of organic origin   . Microalbuminuria   . 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 Obesity, unspecified   . Plantar wart   . Sleep apnea    years ago but never used a cpap  . Synovitis and tenosynovitis, unspecified   . Type II or unspecified type diabetes mellitus without mention of complication, uncontrolled   . Unspecified venous (peripheral) insufficiency   . UTI (urinary tract infection)    frequent with ESBL    HOSPITAL COURSE:   Right hip fracture-s/p mechanical fall Patient already seen by orthopedic service on day 3 postop Pains appear controlled. Physical therapist at bedside  evaluating patient.  Anticipate discharge to skilled nursing facility after the weekend. Patient and his wife were initially refusing to go to a rehab facility and wanted to go home but today they agreed for the bed search. Approved at rehab- [facility] -19 per the rehab's policy for admission- negative.  ESRD on HD TTS-chest x-ray with mild pulmonary edema. Patient on 2 L O2.  Wean off oxygen as tolerated. Nephrology service following.  Fevers;  Temperature of 100.6 postoperatively. Patient has remained afebrile since 2 days.  Chest x-ray done  with similar appearance with prior studies with findings consistent with mild pulmonary edema or multifocal infection.  No shortness of breath.  Patient was placed empirically on IV antibiotics with IV Zosyn pending blood culture results. Pneumonia was suspected but ruled out.  Patient does not have symptoms. Stop IV antibiotics.  Blood cultures are negative. His procalcitonin is higher but that could be due to renal failure issues.  We will continue monitoring off antibiotic. WBC count is getting better.  Hypertension-blood pressures was on the lower side  -Decreased dose on Coreg,  Hold olmesartan, amlodipine, HCTZ  Type2 diabetes-blood sugars controlled  -SSI  Hyperlipidemia-stable -Continue home statin  BPH-stable -Continue home Flomax  DVT prophylaxis- patient started on Lovenox already.  Also has SCD in place.      DISCHARGE CONDITIONS:   Stable.  CONSULTS OBTAINED:  Treatment Team:  Hessie Knows, MD Murlean Iba, MD  DRUG ALLERGIES:  No  Known Allergies  DISCHARGE MEDICATIONS:   Allergies as of 05/21/2018   No Known Allergies     Medication List    STOP taking these medications   Olmesartan-amLODIPine-HCTZ 40-10-25 MG Tabs     TAKE these medications   allopurinol 100 MG tablet Commonly known as:  ZYLOPRIM Take 0.5 tablets (50 mg total) by mouth every other day.   aspirin EC 81 MG tablet Take  81 mg by mouth daily.   atorvastatin 40 MG tablet Commonly known as:  LIPITOR Take 1 tablet (40 mg total) by mouth daily.   calcitRIOL 0.25 MCG capsule Commonly known as:  ROCALTROL Take 0.25 mcg by mouth daily.   carvedilol 6.25 MG tablet Commonly known as:  COREG Take 0.5 tablets (3.125 mg total) by mouth 2 (two) times daily with a meal. What changed:  how much to take   cholecalciferol 25 MCG (1000 UT) tablet Commonly known as:  VITAMIN D3 Take 2 capsules by mouth daily.   docusate sodium 100 MG capsule Commonly known as:  COLACE Take 100 mg by mouth daily.   enoxaparin 40 MG/0.4ML injection Commonly known as:  LOVENOX Inject 0.4 mLs (40 mg total) into the skin daily.   MULTIVITAMINS PO Take 1 tablet by mouth daily.   oxyCODONE 5 MG immediate release tablet Commonly known as:  Oxy IR/ROXICODONE Take 1 tablet (5 mg total) by mouth every 4 (four) hours as needed for moderate pain, severe pain or breakthrough pain.   polyethylene glycol 17 g packet Commonly known as:  MIRALAX / GLYCOLAX Take 17 g by mouth daily as needed for mild constipation.   tamsulosin 0.4 MG Caps capsule Commonly known as:  FLOMAX TAKE 1 CAPSULE (0.4 MG TOTAL) BY MOUTH DAILY AFTER BREAKFAST.   traMADol 50 MG tablet Commonly known as:  ULTRAM Take 1 tablet (50 mg total) by mouth every 6 (six) hours as needed (mild pain).   Trulicity 1.5 DZ/3.2DJ Sopn Generic drug:  Dulaglutide Inject 1.5 mg into the skin See admin instructions. Injects on Sundays   Vitamin B-12 1000 MCG Subl Take 1 tablet by mouth daily.        DISCHARGE INSTRUCTIONS:    Follow with PMD in 1-2 weeks. Ortho in 2 weeks.  If you experience worsening of your admission symptoms, develop shortness of breath, life threatening emergency, suicidal or homicidal thoughts you must seek medical attention immediately by calling 911 or calling your MD immediately  if symptoms less severe.  You Must read complete  instructions/literature along with all the possible adverse reactions/side effects for all the Medicines you take and that have been prescribed to you. Take any new Medicines after you have completely understood and accept all the possible adverse reactions/side effects.   Please note  You were cared for by a hospitalist during your hospital stay. If you have any questions about your discharge medications or the care you received while you were in the hospital after you are discharged, you can call the unit and asked to speak with the hospitalist on call if the hospitalist that took care of you is not available. Once you are discharged, your primary care physician will handle any further medical issues. Please note that NO REFILLS for any discharge medications will be authorized once you are discharged, as it is imperative that you return to your primary care physician (or establish a relationship with a primary care physician if you do not have one) for your aftercare needs so that they can reassess  your need for medications and monitor your lab values.    Today   CHIEF COMPLAINT:   Chief Complaint  Patient presents with  . Fall    HISTORY OF PRESENT ILLNESS:  Joseph Newman  is a 73 y.o. male with a known history of ESRD on HD, hypertension, hyperlipidemia, type 2 diabetes, depression who presented to the ED with right hip pain after a mechanical fall at home.  He states he was trying to sit down on a chair, when he completely missed the chair and landed on his bottom and right hip.  He denies any head trauma or loss of consciousness.  He states he does not have any hip pain when he lays completely still, but he has pretty significant pain whenever he tries to move his legs.  He denies any numbness or tingling of his lower extremities.  He denies any chest pain or shortness of breath.  In the ED, he had desaturations to 88% on room air and was placed on 2 L O2.  Labs are significant for K 5.2.   Chest x-ray with pulmonary venous congestion and mild edema.  Right hip x-ray with acute nondisplaced right subcapital femoral neck fracture.  Hospitalists were called for admission.   VITAL SIGNS:  Blood pressure 124/74, pulse (!) 115, temperature 97.6 F (36.4 C), temperature source Oral, resp. rate (!) 24, height 6\' 5"  (1.956 m), weight 112.9 kg, SpO2 97 %.  I/O:    Intake/Output Summary (Last 24 hours) at 05/21/2018 1325 Last data filed at 05/21/2018 0536 Gross per 24 hour  Intake -  Output 650 ml  Net -650 ml    PHYSICAL EXAMINATION:   Constitutional: He is oriented to person, place, and time. He appears well-developed and well-nourished.  HENT:  Head: Normocephalic and atraumatic.  Right Ear: External ear normal.  Eyes: Pupils are equal, round, and reactive to light. Conjunctivae are normal. Right eye exhibits no discharge.  Neck: Normal range of motion. No thyromegaly present.  Cardiovascular: Normal rate, regular rhythm and normal heart sounds.  Respiratory: Effort normal. No respiratory distress. He has no wheezes. He has rales.  GI: Soft. Bowel sounds are normal. He exhibits no distension.  Musculoskeletal: Normal range of motion.        General: No edema.  Neurological: He is alert and oriented to person, place, and time.  Generalized weakness  Skin: Skin is warm. He is not diaphoretic. No erythema.   DATA REVIEW:   CBC Recent Labs  Lab 05/20/18 0436  WBC 7.6  HGB 10.4*  HCT 32.0*  PLT 188    Chemistries  Recent Labs  Lab 05/15/18 1705  05/18/18 0449 05/20/18 0436  NA 135   < > 138 136  K 5.2*   < > 4.6 4.9  CL 96*   < > 100 100  CO2 25   < > 25 25  GLUCOSE 146*   < > 141* 139*  BUN 41*   < > 54* 52*  CREATININE 5.23*   < > 6.18* 6.41*  CALCIUM 8.4*   < > 8.2* 8.8*  MG  --    < > 2.0  --   AST 19  --   --   --   ALT 13  --   --   --   ALKPHOS 87  --   --   --   BILITOT 0.9  --   --   --    < > = values in  this interval not displayed.     Cardiac Enzymes No results for input(s): TROPONINI in the last 168 hours.  Microbiology Results  Results for orders placed or performed during the hospital encounter of 05/15/18  Surgical PCR screen     Status: Abnormal   Collection Time: 05/15/18 10:05 PM  Result Value Ref Range Status   MRSA, PCR POSITIVE (A) NEGATIVE Final    Comment: RESULT CALLED TO, READ BACK BY AND VERIFIED WITH: Kathyrn Drown RN [id number] 05/15/2018 HNM    Staphylococcus aureus POSITIVE (A) NEGATIVE Final    Comment: (NOTE) The Xpert SA Assay (FDA approved for NASAL specimens in patients 65 years of age and older), is one component of a comprehensive surveillance program. It is not intended to diagnose infection nor to guide or monitor treatment. Performed at [facility], [address] [place], [place] [address]   CULTURE, BLOOD (ROUTINE X 2) w Reflex to ID Panel     Status: None   Collection Time: 05/16/18 10:22 AM  Result Value Ref Range Status   Specimen Description BLOOD LEFT ARM  Final   Special Requests   Final    BOTTLES DRAWN AEROBIC AND ANAEROBIC Blood Culture adequate volume   Culture   Final    NO GROWTH 5 DAYS Performed at [facility], [address] [place], [place] [address]    Report Status 05/21/2018 FINAL  Final  CULTURE, BLOOD (ROUTINE X 2) w Reflex to ID Panel     Status: None   Collection Time: 05/16/18 10:29 AM  Result Value Ref Range Status   Specimen Description BLOOD LEFT ANTECUBITAL  Final   Special Requests   Final    BOTTLES DRAWN AEROBIC AND ANAEROBIC Blood Culture adequate volume   Culture   Final    NO GROWTH 5 DAYS Performed at [facility], [address], [place], [place] [address]    Report Status 05/21/2018 FINAL  Final  SARS Coronavirus 2 [facility] order, Performed in [place] hospital lab)     Status: None   Collection Time: 05/21/18 11:22 AM  Result Value Ref Range Status   SARS Coronavirus 2 NEGATIVE NEGATIVE Final     Comment: (NOTE) If result is NEGATIVE SARS-CoV-2 target nucleic acids are NOT DETECTED. The SARS-CoV-2 RNA is generally detectable in upper and lower  respiratory specimens during the acute phase of infection. The lowest  concentration of SARS-CoV-2 viral copies this assay can detect is 250  copies / mL. A negative result does not preclude SARS-CoV-2 infection  and should not be used as the sole basis for treatment or other  patient management decisions.  A negative result may occur with  improper specimen collection / handling, submission of specimen other  than nasopharyngeal swab, presence of viral mutation(s) within the  areas targeted by this assay, and inadequate number of viral copies  (<250 copies / mL). A negative result must be combined with clinical  observations, patient history, and epidemiological information. If result is POSITIVE SARS-CoV-2 target nucleic acids are DETECTED. The SARS-CoV-2 RNA is generally detectable in upper and lower  respiratory specimens dur ing the acute phase of infection.  Positive  results are indicative of active infection with SARS-CoV-2.  Clinical  correlation with patient history and other diagnostic information is  necessary to determine patient infection status.  Positive results do  not rule out bacterial infection or co-infection with other viruses. If result is PRESUMPTIVE POSTIVE SARS-CoV-2 nucleic acids MAY BE PRESENT.  A presumptive positive result was obtained on the submitted specimen  and confirmed on repeat testing.  While 2019 novel coronavirus  (SARS-CoV-2) nucleic acids may be present in the submitted sample  additional confirmatory testing may be necessary for epidemiological  and / or clinical management purposes  to differentiate between  SARS-CoV-2 and other Sarbecovirus currently known to infect humans.  If clinically indicated additional testing with an alternate test  methodology [phone]) is advised. The SARS-CoV-2  RNA is generally  detectable in upper and lower respiratory sp ecimens during the acute  phase of infection. The expected result is Negative. Fact Sheet for Patients:  StrictlyIdeas.no Fact Sheet for Healthcare Providers: BankingDealers.co.za This test is not yet approved or cleared by the [place] FDA and has been authorized for detection and/or diagnosis of SARS-CoV-2 by FDA under an Emergency Use Authorization (EUA).  This EUA will remain in effect (meaning this test can be used) for the duration of the COVID-19 declaration under Section 564(b)(1) of the Act, 21 U.S.C. section 360bbb-3(b)(1), unless the authorization is terminated or revoked sooner. Performed at [facility], [address] [place], [place] [address]     RADIOLOGY:  Dg Chest 1 View  Result Date: 05/20/2018 CLINICAL DATA:  Hypoxia. EXAM: CHEST  1 VIEW COMPARISON:  One-view chest x-ray 05/18/2018 FINDINGS: Heart is enlarged. Atherosclerotic calcifications are present at the aortic arch. Diffuse interstitial and airspace pattern has increased. IMPRESSION: 1. Cardiomegaly with diffuse increased interstitial and airspace opacity. While this likely represents edema and congestive heart failure, infection is not excluded. Electronically Signed   By: San Morelle M.D.   On: 05/20/2018 08:54    EKG:   Orders placed or performed during the hospital encounter of 05/15/18  . EKG 12-Lead  . EKG 12-Lead      Management plans discussed with the patient, family and they are in agreement.  CODE STATUS:     Code Status Orders  (From admission, onward)         Start     Ordered   05/15/18 2116  Full code  Continuous     05/15/18 2116        Code Status History    Date Active Date Inactive Code Status Order ID Comments User Context   11/01/2017 1835 11/07/2017 2158 DNR [id number]  Demetrios Loll, MD Inpatient   09/25/2017 1616 09/29/2017 2043 Full Code  [id number]  Epifanio Lesches, MD ED      TOTAL TIME TAKING CARE OF THIS PATIENT: 35 minutes.    Vaughan Basta M.D on 05/21/2018 at 1:25 PM  Between 7am to 6pm - Pager - [phone]  After 6pm go to [email or web address] - password [email or web address] [place] [facility]  Office  [phone]  CC: Primary care physician; Steele Sizer, MD   Note: This dictation was prepared with Dragon dictation along with smaller phrase technology. Any transcriptional errors that result from this process are unintentional.

## 2018-05-21 NOTE — Progress Notes (Signed)
Physical Therapy Treatment Patient Details Name: Joseph Newman MRN: [id number] DOB: 1945-08-28 Today's Date: 05/21/2018    History of Present Illness Pt is 73 yo male s/p cannulated hip pinning after a fall at home. PMH of COPD, HTN, renal disease, HLD, DM    PT Comments    Patient in bed upon PT and mobility tech arrival. Nasal cannula is off patient, repositioned onto patient prior to start of session. Patient repositioned in bed for neutral trunk and pelvis alignment due to excessive L lean with rotation resulting in poor positioning. Vitals monitored with HR remaining >125 with occasional PVC's limiting mobility performed. Patient agreeable to supine interventions which were performed with HR monitoring and limited to no increase in HR. Patient fatigued and declined further session after interventions listed below. Nurse and CNA called to update on cath being off and performance with vitals. Current POC remains appropriate.    Follow Up Recommendations  SNF     Equipment Recommendations  Rolling walker with 5" wheels    Recommendations for Other Services OT consult     Precautions / Restrictions Precautions Precautions: Fall Restrictions Weight Bearing Restrictions: Yes RLE Weight Bearing: Weight bearing as tolerated Other Position/Activity Restrictions: limited ability to assist with transfers    Mobility  Bed Mobility               General bed mobility comments: patient HR >125 with PVC's so bed mobility not indicated. Nursing notified  Transfers                    Ambulation/Gait                 Stairs             Wheelchair Mobility    Modified Rankin (Stroke Patients Only)       Balance                                            Cognition Arousal/Alertness: Awake/alert Behavior During Therapy: Flat affect Overall Cognitive Status: No family/caregiver present to determine baseline cognitive functioning                                  General Comments: inconsistent command follow, curing verbally and tactily for task orientation and sequencing.       Exercises General Exercises - Lower Extremity Quad Sets: AROM;Both;5 reps Short Arc Quad: AAROM;Both;10 reps Heel Slides: AAROM;Both;10 reps Hip ABduction/ADduction: AAROM;Left;10 reps;PROM;Right Straight Leg Raises: AROM;Left;AAROM;Right;Strengthening;10 reps;Supine Other Exercises Other Exercises: positioning in bed for optimal placement of trunk and pelvis     General Comments        Pertinent Vitals/Pain Pain Assessment: 0-10 Pain Score: 10-Worst pain ever Pain Location: with movement of RLE of 10/10 pain  Pain Descriptors / Indicators: Aching;Grimacing;Guarding Pain Intervention(s): Limited activity within patient's tolerance;Repositioned    Home Living                      Prior Function            PT Goals (current goals can now be found in the care plan section) Acute Rehab PT Goals Patient Stated Goal: to decrease pain PT Goal Formulation: With patient Time For Goal Achievement: 05/31/18 Potential to Achieve Goals: Fair Progress towards PT  goals: Progressing toward goals(slowly )    Frequency    BID      PT Plan Current plan remains appropriate    Co-evaluation              AM-PAC PT "6 Clicks" Mobility   Outcome Measure  Help needed turning from your back to your side while in a flat bed without using bedrails?: Total Help needed moving from lying on your back to sitting on the side of a flat bed without using bedrails?: Total Help needed moving to and from a bed to a chair (including a wheelchair)?: Total Help needed standing up from a chair using your arms (e.g., wheelchair or bedside chair)?: Total Help needed to walk in hospital room?: Total Help needed climbing 3-5 steps with a railing? : Total 6 Click Score: 6    End of Session Equipment Utilized During Treatment:  Oxygen(oxygen initially off upon PT entering room, put back on) Activity Tolerance: Patient limited by pain;Patient limited by fatigue;Other (comment)(HR and PVCs) Patient left: in bed;with call bell/phone within reach;with bed alarm set;with SCD's reapplied Nurse Communication: Mobility status(oxygen put back on, vitals and performance, cath off) PT Visit Diagnosis: Other abnormalities of gait and mobility (R26.89);Muscle weakness (generalized) (M62.81);Pain;Difficulty in walking, not elsewhere classified (R26.2) Pain - Right/Left: Right Pain - part of body: Hip     Time: 1610-9604 PT Time Calculation (min) (ACUTE ONLY): 17 min  Charges:  $Therapeutic Exercise: 8-22 mins                     Janna Arch, PT, DPT     Janna Arch 05/21/2018, 10:19 AM

## 2018-05-21 NOTE — Progress Notes (Signed)
Pre HD Tx - Pt Joseph Newman elevated, B/P soft, MD d/c UF no fluid removal this TX. Pt denies chest pain, dizziness, appears at his baseline.    05/21/18 1332  Hand-Off documentation  Report given to (Full Name) Garlan Fair, RN   Report received from (Full Name) Beatris Ship, RN   Vital Signs  Temp 98.1 F (36.7 C)  Temp Source Oral  Pulse Rate (!) 110  Pulse Rate Source Monitor  Resp (!) 22  BP 122/77  BP Location Left Arm  BP Method Automatic  Patient Position (if appropriate) Lying  Oxygen Therapy  SpO2 100 %  O2 Device Nasal Cannula  O2 Flow Rate (L/min) 3 L/min  Pulse Oximetry Type Continuous  Pain Assessment  Pain Scale 0-10  Pain Score 0  Dialysis Weight  Weight 112.9 kg  Type of Weight Pre-Dialysis  Time-Out for Hemodialysis  What Procedure? HD  Pt Identifiers(min of two) First/Last Name;MRN/Account#  Correct Site? Yes  Correct Side? Yes  Correct Procedure? Yes  Consents Verified? Yes  Rad Studies Available? N/A  Engineer, civil (consulting) Number 3  Station Number 2  UF/Alarm Test Passed  Conductivity: Meter 13.8  Conductivity: Machine  13.8  pH 7.4  Reverse Osmosis Main  Normal Saline Lot Number [id number]  Dialyzer Lot Number [id number]  Disposable Set Lot Number [id number]  Machine Temperature 98.6 F (37 C)  Musician and Audible Yes  Blood Lines Intact and Secured Yes  Pre Treatment Patient Checks  Vascular access used during treatment Fistula  Hepatitis B Surface Antigen Results Negative  Date Hepatitis B Surface Antigen Drawn 04/25/18  Hepatitis B Surface Antibody 3  Date Hepatitis B Surface Antibody Drawn 10/01/17  Hemodialysis Consent Verified Yes  Hemodialysis Standing Orders Initiated Yes  ECG (Telemetry) Monitor On Yes  Prime Ordered Normal Saline  Length of  DialysisTreatment -hour(s) 3 Hour(s)  Dialysis Treatment Comments Na 140  Dialyzer Elisio 17H NR  Dialysate 2K, 2.5 Ca  Dialysis Anticoagulant None  Dialysate Flow Ordered 800   Blood Flow Rate Ordered 400 mL/min  Ultrafiltration Goal 0 Liters (verbal order change )  Pre Treatment Labs Phosphorus  Dialysis Blood Pressure Support Ordered Normal Saline  Fistula / Graft Right Upper arm Arteriovenous fistula  Placement Date: 05/15/18   Placed prior to admission: Yes  Orientation: Right  Access Location: Upper arm  Access Type: Arteriovenous fistula  Site Condition No complications  Fistula / Graft Assessment Present;Thrill  Status Patent

## 2018-05-21 NOTE — Progress Notes (Signed)
Pt off floor for HD.

## 2018-05-21 NOTE — Progress Notes (Signed)
   Subjective: 5 Days Post-Op Procedure(s) (LRB): CANNULATED HIP PINNING (Right) Patient reports no hip or lower extremity discomfort.   Patient is well, and has had no acute complaints or problems Denies any cough, CP, SOB, ABD pain. We will continue therapy today.    Objective: Vital signs in last 24 hours: Temp:  [97.6 F (36.4 C)-98.5 F (36.9 C)] 97.6 F (36.4 C) (04/28 0741) Pulse Rate:  [115-120] 119 (04/28 0741) Resp:  [21-24] 24 (04/28 0350) BP: (117-137)/(61-82) 121/78 (04/28 0741) SpO2:  [89 %-99 %] 97 % (04/28 0741) Weight:  [112.9 kg] 112.9 kg (04/28 0351)  Intake/Output from previous day: 04/27 0701 - 04/28 0700 In: -  Out: 950 [Urine:950] Intake/Output this shift: No intake/output data recorded.  Recent Labs    05/19/18 0534 05/20/18 0436  HGB 9.9* 10.4*   Recent Labs    05/19/18 0534 05/20/18 0436  WBC 8.8 7.6  RBC 3.49* 3.64*  HCT 30.9* 32.0*  PLT 167 188   Recent Labs    05/20/18 0436  NA 136  K 4.9  CL 100  CO2 25  BUN 52*  CREATININE 6.41*  GLUCOSE 139*  CALCIUM 8.8*   No results for input(s): LABPT, INR in the last 72 hours.  EXAM General - Patient is Alert, Appropriate and Oriented Extremity - Dorsiflexion/Plantar flexion intact No cellulitis present Compartment soft - homans sign Dressing - dressing C/D/I and no drainage Motor Function - intact, moving foot and toes well on exam.   Past Medical History:  Diagnosis Date  . Acute metabolic encephalopathy 22/97/9892   Per my chart  . Arthritis   . Chronic airway obstruction, not elsewhere classified    worked in [employer] and was exposed to [employer]  . 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 Holley Raring, nephrologist.  on dialysis tu - thur - sat  . Depressive disorder, not elsewhere classified   . Dialysis patient [facility]) 01/2018   dialysis tuesday - thursday - saturday  . Gout   . Gout   . Heart murmur    followed by dr. Clayborn Bigness  .  Hyperlipidemia   . Hypertension   . Hypertrophy of prostate without urinary obstruction and other lower urinary tract symptoms (LUTS)   . Impotence of organic origin   . Microalbuminuria   . 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 Obesity, unspecified   . Plantar wart   . Sleep apnea    years ago but never used a cpap  . Synovitis and tenosynovitis, unspecified   . Type II or unspecified type diabetes mellitus without mention of complication, uncontrolled   . Unspecified venous (peripheral) insufficiency   . UTI (urinary tract infection)    frequent with ESBL    Assessment/Plan:   5 Days Post-Op Procedure(s) (LRB): CANNULATED HIP PINNING (Right) Active Problems:   Closed right hip fracture (HCC)  Estimated body mass index is 29.53 kg/m as calculated from the following:   Height as of this encounter: 6\' 5"  (1.956 m).   Weight as of this encounter: 112.9 kg. Advance diet Up with therapy, WBAT RLE Pain well controlled Hgb stable, trending up. Patient scheduled for dialysis today  Follow up with [facility] in 2 weeks for staple removal TED hose BLE x 6 weeks    DVT Prophylaxis - TED hose and SCDs, Heparin Weight-Bearing as tolerated to right leg   T. Rachelle Hora, PA-C [place] 05/21/2018, 8:33 AM

## 2018-05-21 NOTE — Progress Notes (Signed)
Post HD Tx    05/21/18 1647  Hand-Off documentation  Report given to (Full Name) Garlan Fair, RN   Report received from (Full Name) Beatris Ship, RN   Vital Signs  Temp 98.1 F (36.7 C)  Temp Source Oral  Pulse Rate (!) 126  Pulse Rate Source Monitor  Resp 14  BP (!) 97/58  BP Location Left Arm  BP Method Automatic  Patient Position (if appropriate) Lying  Oxygen Therapy  SpO2 94 %  O2 Device Room Air  Pain Assessment  Pain Scale 0-10  Pain Score 0  Dialysis Weight  Weight 113.2 kg  Type of Weight Post-Dialysis  Fistula / Graft Right Upper arm Arteriovenous fistula  Placement Date: 05/15/18   Placed prior to admission: Yes  Orientation: Right  Access Location: Upper arm  Access Type: Arteriovenous fistula  Site Condition No complications  Fistula / Graft Assessment Present;Thrill  Status Deaccessed  Drainage Description None

## 2018-05-21 NOTE — Progress Notes (Signed)
PT Cancellation Note  Patient Details Name: Joseph Newman MRN: [id number] DOB: [birth year]   Cancelled Treatment:    Reason Eval/Treat Not Completed: Patient at procedure or test/unavailable(at dialysis) will attempt again at later time/date when patient is available.   Janna Arch, PT, DPT   05/21/2018, 2:01 PM

## 2018-05-22 LAB — GLUCOSE, CAPILLARY
Glucose-Capillary: 151 mg/dL — ABNORMAL HIGH (ref 70–99)
Glucose-Capillary: 220 mg/dL — ABNORMAL HIGH (ref 70–99)
Glucose-Capillary: 114 mg/dL — ABNORMAL HIGH (ref 70–99)
Glucose-Capillary: 129 mg/dL — ABNORMAL HIGH (ref 70–99)

## 2018-05-22 MED ORDER — CARVEDILOL 6.25 MG PO TABS: 6.2500 mg | ORAL_TABLET | Freq: Two times a day (BID) | ORAL | 0 refills | Status: DC

## 2018-05-22 MED ORDER — FUROSEMIDE 20 MG PO TABS
20.0000 mg | ORAL_TABLET | Freq: Every day | ORAL | Status: DC
  Administered 2018-05-22: 20 mg via ORAL
  Filled 2018-05-22 (×2): qty 1

## 2018-05-22 MED ORDER — FUROSEMIDE 20 MG PO TABS: 20.0000 mg | ORAL_TABLET | Freq: Every day | ORAL | 0 refills | Status: DC

## 2018-05-22 MED ORDER — CARVEDILOL 6.25 MG PO TABS
6.2500 mg | ORAL_TABLET | Freq: Two times a day (BID) | ORAL | Status: DC
  Administered 2018-05-22 (×2): 6.25 mg via ORAL
  Filled 2018-05-22: qty 1
  Filled 2018-05-22: qty 2
  Filled 2018-05-22 (×2): qty 1
  Filled 2018-05-22: qty 2

## 2018-05-22 NOTE — Progress Notes (Signed)
[facility] at [place] NAME: Joseph Newman    MR#:  [id number]  DATE OF BIRTH:  July 06, 1945  SUBJECTIVE:  CHIEF COMPLAINT:   Chief Complaint  Patient presents with  . Fall   No new complaint this morning.  Worked with physical therapy.  Complain of pain with movements.  He is requiring 3 ltr oxygen , but not in any discomfort today.  REVIEW OF SYSTEMS:  Review of Systems  Constitutional: Negative for chills and fever.  HENT: Negative for hearing loss and tinnitus.   Eyes: Negative for blurred vision and double vision.  Respiratory: Negative for cough and hemoptysis.   Cardiovascular: Negative for chest pain and palpitations.  Gastrointestinal: Negative for heartburn and nausea.  Genitourinary: Negative for dysuria and urgency.  Musculoskeletal: Negative for myalgias and neck pain.  Skin: Negative for itching and rash.  Neurological: Negative for dizziness and headaches.  Psychiatric/Behavioral: Negative for depression and hallucinations.    DRUG ALLERGIES:  No Known Allergies VITALS:  Blood pressure 109/67, pulse (!) 106, temperature 97.8 F (36.6 C), resp. rate 17, height 6\' 5"  (1.956 m), weight 113.2 kg, SpO2 96 %. PHYSICAL EXAMINATION:   Physical Exam  Constitutional: He is oriented to person, place, and time. He appears well-developed and well-nourished.  HENT:  Head: Normocephalic and atraumatic.  Right Ear: External ear normal.  Eyes: Pupils are equal, round, and reactive to light. Conjunctivae are normal. Right eye exhibits no discharge.  Neck: Normal range of motion. No thyromegaly present.  Cardiovascular: Normal rate, regular rhythm and normal heart sounds.  Respiratory: Effort normal. No respiratory distress. He has no wheezes. He has rales.  GI: Soft. Bowel sounds are normal. He exhibits no distension.  Musculoskeletal: Normal range of motion.        General: No edema.  Neurological: He is alert and oriented to person,  place, and time.  Generalized weakness  Skin: Skin is warm. He is not diaphoretic. No erythema.   LABORATORY PANEL:  Male CBC Recent Labs  Lab 05/20/18 0436  WBC 7.6  HGB 10.4*  HCT 32.0*  PLT 188   ------------------------------------------------------------------------------------------------------------------ Chemistries  Recent Labs  Lab 05/15/18 1705  05/18/18 0449 05/20/18 0436  NA 135   < > 138 136  K 5.2*   < > 4.6 4.9  CL 96*   < > 100 100  CO2 25   < > 25 25  GLUCOSE 146*   < > 141* 139*  BUN 41*   < > 54* 52*  CREATININE 5.23*   < > 6.18* 6.41*  CALCIUM 8.4*   < > 8.2* 8.8*  MG  --    < > 2.0  --   AST 19  --   --   --   ALT 13  --   --   --   ALKPHOS 87  --   --   --   BILITOT 0.9  --   --   --    < > = values in this interval not displayed.   RADIOLOGY:  No results found. ASSESSMENT AND PLAN:   Right hip fracture-s/p mechanical fall Patient already seen by orthopedic service on day 3 postop Pains appear controlled. Physical therapist at bedside evaluating patient.  Anticipate discharge to skilled nursing facility after the weekend. Patient and his wife were initially refusing to go to a rehab facility and wanted to go home but today they agreed for the bed search. Nursing  home has accepted him but they wanted a COVID-19 test result before admission which was done and negative. NH have to have Hep B surface Ag test result to have HD at a new place for the pt.  ESRD on HD TTS- chest x-ray with mild pulmonary edema.  Patient on 2 L O2.  Wean off oxygen as tolerated. Nephrology service following.  As NH have to send hin to a different HD unit, they are asking Hep B surface Ag test before accepting, which may be till tomorrow.  Fevers;  Temperature of 100.6 postoperatively. Patient has remained afebrile since 2 days.  Chest x-ray done  with similar appearance with prior studies with findings consistent with mild pulmonary edema or multifocal infection.   No shortness of breath.  Patient was placed empirically on IV antibiotics with IV Zosyn pending blood culture results. Pneumonia was suspected but ruled out.  Patient does not have symptoms. Stop IV antibiotics.  Blood cultures are negative. His procalcitonin is higher but that could be due to renal failure issues.  We will continue monitoring off antibiotic. WBC count is normal now.  Hypertension-blood pressures was on the lower side last night -Cont Coreg, stop olmesartan, amlodipine, HCTZ  Type 2 diabetes-blood sugars controlled  -SSI  Hyperlipidemia-stable -Continue home statin  BPH-stable -Continue home Flomax  DVT prophylaxis- patient started on Lovenox already.  Also has SCD in place.     All the records are reviewed and case discussed with Care Management/Social Worker. Management plans discussed with the patient, family and they are in agreement.  CODE STATUS: Full Code  TOTAL TIME TAKING CARE OF THIS PATIENT: 35 minutes.   More than 50% of the time was spent in counseling/coordination of care: YES  POSSIBLE D/C IN 1-2 DAYS, DEPENDING ON CLINICAL CONDITION.   Vaughan Basta M.D on 05/22/2018 at 1:01 PM  Between 7am to 6pm - Pager - [phone]  After 6pm go to [email or web address] - [employer] [facility]  Office  [phone]  CC: Primary care physician; Steele Sizer, MD  Note: This dictation was prepared with Dragon dictation along with smaller phrase technology. Any transcriptional errors that result from this process are unintentional.

## 2018-05-22 NOTE — Progress Notes (Signed)
PT Cancellation Note  Patient Details Name: Joseph Newman MRN: [id number] DOB: 1945/03/28   Cancelled Treatment:    Reason Eval/Treat Not Completed: Fatigue/lethargy limiting ability to participate(Patient sleeping. Not able to rouse. ) Patient sleeping, unable to rouse. Patient no longer pending discharge. Upon conversing with nurse, patient is waiting Hep B test and dialysis placement before he can go to SNF. Patient having consistent sinus tach and HR>120 at rest.   Janna Arch, PT, DPT   05/22/2018, 3:43 PM

## 2018-05-22 NOTE — Progress Notes (Signed)
Received notice that this patient is going to [facility] for rehab. Patient will need to transfer clinics, sending referral to [facility], requested for Hep B antigen to be drawn. Barrier for discharge is clinic and doctor acceptance. This will take at least 24 hours.

## 2018-05-22 NOTE — Progress Notes (Signed)
PT Cancellation Note  Patient Details Name: Fritz Cauthon MRN: [id number] DOB: 1945-07-07   Cancelled Treatment:    Reason Eval/Treat Not Completed: Fatigue/lethargy limiting ability to participate(Patient sleeping. Upon waking patient up patient grunted and fell back asleep. ) Will attempt again at later time/date.   Janna Arch, PT, DPT   05/22/2018, 11:56 AM

## 2018-05-22 NOTE — Progress Notes (Signed)
[place], [place] 05/22/18  Subjective:  Patient underwent hemodialysis yesterday. Tolerated well. Resting in bed comfortably at the moment.   Objective:  Vital signs in last 24 hours:  Temp:  [97.8 F (36.6 C)-98.2 F (36.8 C)] 97.8 F (36.6 C) (04/29 0806) Pulse Rate:  [100-131] 125 (04/29 0806) Resp:  [13-49] 17 (04/29 0806) BP: (81-126)/(55-81) 109/67 (04/29 0806) SpO2:  [91 %-100 %] 96 % (04/29 0806) Weight:  [112.9 kg-113.2 kg] 113.2 kg (04/28 1722)  Weight change: -0.046 kg Filed Weights   05/21/18 1332 05/21/18 1647 05/21/18 1722  Weight: 112.9 kg 113.2 kg 113.2 kg    Intake/Output:   No intake or output data in the 24 hours ending 05/22/18 1101   Physical Exam: General:  Chronically ill-appearing, laying in the bed  HEENT  anicteric, moist oral mucous membranes  Neck  supple  Pulm/lungs  normal breathing effort, mild crackles at bases b/l  CVS/Heart  irregular, no rub  Abdomen:   Soft, nontender  Extremities:  Trace peripheral edema  Neurologic:  awake, alert, follows commands  Skin:  No acute rashes  Access:  Right arm AV fistula, good bruit       Basic Metabolic Panel:  Recent Labs  Lab 05/15/18 1705 05/16/18 0255 05/16/18 1647 05/17/18 0539 05/18/18 0449 05/20/18 0436  NA 135 136 138 138 138 136  K 5.2* 4.6 4.1 4.4 4.6 4.9  CL 96* 100  --  98 100 100  CO2 25 23  --  24 25 25   GLUCOSE 146* 145* 123* 155* 141* 139*  BUN 41* 44*  --  40* 54* 52*  CREATININE 5.23* 5.62*  --  5.30* 6.18* 6.41*  CALCIUM 8.4* 8.5*  --  8.1* 8.2* 8.8*  MG  --   --   --  1.9 2.0  --   PHOS  --   --   --  5.2* 6.3*  --      CBC: Recent Labs  Lab 05/15/18 1705 05/16/18 0255 05/16/18 1647 05/17/18 0539 05/18/18 0449 05/19/18 0534 05/20/18 0436  WBC 7.6 7.7  --  9.5 12.1* 8.8 7.6  NEUTROABS 5.2  --   --   --   --   --   --   HGB 11.8* 11.5* 10.9* 11.6* 8.6* 9.9* 10.4*  HCT 35.9* 35.2* 32.0* 35.4* 27.0* 30.9* 32.0*  MCV  86.3 86.9  --  86.3 88.2 88.5 87.9  PLT 209 209  --  188 190 167 188      Lab Results  Component Value Date   HEPBSAG Negative 09/27/2017      Microbiology:  Recent Results (from the past 240 hour(s))  Surgical PCR screen     Status: Abnormal   Collection Time: 05/15/18 10:05 PM  Result Value Ref Range Status   MRSA, PCR POSITIVE (A) NEGATIVE Final    Comment: RESULT CALLED TO, READ BACK BY AND VERIFIED WITH: Kathyrn Drown RN [id number] 05/15/2018 HNM    Staphylococcus aureus POSITIVE (A) NEGATIVE Final    Comment: (NOTE) The Xpert SA Assay (FDA approved for NASAL specimens in patients 70 years of age and older), is one component of a comprehensive surveillance program. It is not intended to diagnose infection nor to guide or monitor treatment. Performed at [facility], [place],  [address]   CULTURE, BLOOD (ROUTINE X 2) w Reflex to ID Panel     Status: None   Collection Time: 05/16/18 10:22 AM  Result  Value Ref Range Status   Specimen Description BLOOD LEFT ARM  Final   Special Requests   Final    BOTTLES DRAWN AEROBIC AND ANAEROBIC Blood Culture adequate volume   Culture   Final    NO GROWTH 5 DAYS Performed at [facility], [address] [place], [place] [address]    Report Status 05/21/2018 FINAL  Final  CULTURE, BLOOD (ROUTINE X 2) w Reflex to ID Panel     Status: None   Collection Time: 05/16/18 10:29 AM  Result Value Ref Range Status   Specimen Description BLOOD LEFT ANTECUBITAL  Final   Special Requests   Final    BOTTLES DRAWN AEROBIC AND ANAEROBIC Blood Culture adequate volume   Culture   Final    NO GROWTH 5 DAYS Performed at [facility], [address], [place], [place] [address]    Report Status 05/21/2018 FINAL  Final  SARS Coronavirus 2 [facility] order, Performed in [place] hospital lab)     Status: None   Collection Time: 05/21/18 11:22 AM  Result Value Ref Range Status   SARS Coronavirus 2  NEGATIVE NEGATIVE Final    Comment: (NOTE) If result is NEGATIVE SARS-CoV-2 target nucleic acids are NOT DETECTED. The SARS-CoV-2 RNA is generally detectable in upper and lower  respiratory specimens during the acute phase of infection. The lowest  concentration of SARS-CoV-2 viral copies this assay can detect is 250  copies / mL. A negative result does not preclude SARS-CoV-2 infection  and should not be used as the sole basis for treatment or other  patient management decisions.  A negative result may occur with  improper specimen collection / handling, submission of specimen other  than nasopharyngeal swab, presence of viral mutation(s) within the  areas targeted by this assay, and inadequate number of viral copies  (<250 copies / mL). A negative result must be combined with clinical  observations, patient history, and epidemiological information. If result is POSITIVE SARS-CoV-2 target nucleic acids are DETECTED. The SARS-CoV-2 RNA is generally detectable in upper and lower  respiratory specimens dur ing the acute phase of infection.  Positive  results are indicative of active infection with SARS-CoV-2.  Clinical  correlation with patient history and other diagnostic information is  necessary to determine patient infection status.  Positive results do  not rule out bacterial infection or co-infection with other viruses. If result is PRESUMPTIVE POSTIVE SARS-CoV-2 nucleic acids MAY BE PRESENT.   A presumptive positive result was obtained on the submitted specimen  and confirmed on repeat testing.  While 2019 novel coronavirus  (SARS-CoV-2) nucleic acids may be present in the submitted sample  additional confirmatory testing may be necessary for epidemiological  and / or clinical management purposes  to differentiate between  SARS-CoV-2 and other Sarbecovirus currently known to infect humans.  If clinically indicated additional testing with an alternate test  methodology [phone])  is advised. The SARS-CoV-2 RNA is generally  detectable in upper and lower respiratory sp ecimens during the acute  phase of infection. The expected result is Negative. Fact Sheet for Patients:  StrictlyIdeas.no Fact Sheet for Healthcare Providers: BankingDealers.co.za This test is not yet approved or cleared by the [place] FDA and has been authorized for detection and/or diagnosis of SARS-CoV-2 by FDA under an Emergency Use Authorization (EUA).  This EUA will remain in effect (meaning this test can be used) for the duration of the COVID-19 declaration under Section 564(b)(1) of the Act, 21 U.S.C. section  360bbb-3(b)(1), unless the authorization is terminated or revoked sooner. Performed at [facility], [address] [place], [place] [address]     Coagulation Studies: No results for input(s): LABPROT, INR in the last 72 hours.  Urinalysis: No results for input(s): COLORURINE, LABSPEC, PHURINE, GLUCOSEU, HGBUR, BILIRUBINUR, KETONESUR, PROTEINUR, UROBILINOGEN, NITRITE, LEUKOCYTESUR in the last 72 hours.  Invalid input(s): APPERANCEUR    Imaging: No results found.   Medications:    . allopurinol  50 mg Oral QODAY  . aspirin EC  81 mg Oral Daily  . atorvastatin  40 mg Oral Daily  . calcium acetate  1,334 mg Oral TID WC  . carvedilol  6.25 mg Oral BID WC  . Chlorhexidine Gluconate Cloth  6 each Topical Q0600  . docusate sodium  100 mg Oral BID  . epoetin (EPOGEN/PROCRIT) injection  4,000 Units Intravenous Q T,Th,Sa-HD  . furosemide  20 mg Oral Daily  . heparin injection (subcutaneous)  5,000 Units Subcutaneous Q8H  . insulin aspart  0-5 Units Subcutaneous QHS  . insulin aspart  0-9 Units Subcutaneous TID WC  . tamsulosin  0.4 mg Oral QPC breakfast   acetaminophen **OR** acetaminophen, alum & mag hydroxide-simeth, bisacodyl, magnesium citrate, magnesium hydroxide, menthol-cetylpyridinium **OR** phenol,  morphine injection, ondansetron **OR** ondansetron (ZOFRAN) IV, oxyCODONE, polyethylene glycol, traMADol  Assessment/ Plan:  73 y.o. African-American male with end-stage renal disease, depression, gout, BPH, history of urinary retention, followed by urologist Dr. Bernardo Heater, history of colonization of urine  Davita Graham/TTS/103.5 kg/Rt arm AVF/  1.  End-stage renal disease -Patient completed dialysis yesterday.  No acute indication for dialysis today.  We will plan for dialysis again tomorrow.  2.  Anemia of chronic kidney disease -Continue Epogen 4000 units IV on TTS.  3.  Secondary hyperparathyroidism -Maintain the patient on calcium acetate 2 tablets p.o. 3 times daily with meals.  4.  Right hip fracture Underwent right hip pinning on 4/23  5. Hypoxia Patient changed to furosemide 20 mg p.o. daily.   LOS: 7 Gerhardt Gleed 4/29/202011:01 AM  [place], [place]  Note: This note was prepared with Dragon dictation. Any transcription errors are unintentional

## 2018-05-22 NOTE — Progress Notes (Signed)
   Subjective: 6 Days Post-Op Procedure(s) (LRB): CANNULATED HIP PINNING (Right) Patient reports no hip or lower extremity discomfort.   Patient is well, and has had no acute complaints or problems Denies any cough, CP, SOB, ABD pain. We will continue therapy today.    Objective: Vital signs in last 24 hours: Temp:  [97.8 F (36.6 C)-98.2 F (36.8 C)] 97.8 F (36.6 C) (04/29 0806) Pulse Rate:  [100-131] 125 (04/29 0806) Resp:  [13-49] 17 (04/29 0806) BP: (81-126)/(55-81) 109/67 (04/29 0806) SpO2:  [91 %-100 %] 96 % (04/29 0806) Weight:  [112.9 kg-113.2 kg] 113.2 kg (04/28 1722)  Intake/Output from previous day: No intake/output data recorded. Intake/Output this shift: No intake/output data recorded.  Recent Labs    05/20/18 0436  HGB 10.4*   Recent Labs    05/20/18 0436  WBC 7.6  RBC 3.64*  HCT 32.0*  PLT 188   Recent Labs    05/20/18 0436  NA 136  K 4.9  CL 100  CO2 25  BUN 52*  CREATININE 6.41*  GLUCOSE 139*  CALCIUM 8.8*   No results for input(s): LABPT, INR in the last 72 hours.  EXAM General - Patient is Alert, Appropriate and Oriented Extremity - Dorsiflexion/Plantar flexion intact No cellulitis present Compartment soft - homans sign Dressing - dressing C/D/I and no drainage Motor Function - intact, moving foot and toes well on exam.   Past Medical History:  Diagnosis Date  . Acute metabolic encephalopathy 07/23/1599   Per my chart  . Arthritis   . Chronic airway obstruction, not elsewhere classified    worked in [employer] and was exposed to [employer]  . 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 Holley Raring, nephrologist.  on dialysis tu - thur - sat  . Depressive disorder, not elsewhere classified   . Dialysis patient [facility]) 01/2018   dialysis tuesday - thursday - saturday  . Gout   . Gout   . Heart murmur    followed by dr. Clayborn Bigness  . Hyperlipidemia   . Hypertension   . Hypertrophy of prostate without  urinary obstruction and other lower urinary tract symptoms (LUTS)   . Impotence of organic origin   . Microalbuminuria   . 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 Obesity, unspecified   . Plantar wart   . Sleep apnea    years ago but never used a cpap  . Synovitis and tenosynovitis, unspecified   . Type II or unspecified type diabetes mellitus without mention of complication, uncontrolled   . Unspecified venous (peripheral) insufficiency   . UTI (urinary tract infection)    frequent with ESBL    Assessment/Plan:   6 Days Post-Op Procedure(s) (LRB): CANNULATED HIP PINNING (Right) Active Problems:   Closed right hip fracture (HCC)  Estimated body mass index is 29.59 kg/m as calculated from the following:   Height as of this encounter: 6\' 5"  (1.956 m).   Weight as of this encounter: 113.2 kg. Advance diet Up with therapy, WBAT RLE Pain well controlled Hgb stable  Follow up with [facility] in 2 weeks for staple removal TED hose BLE x 6 weeks    DVT Prophylaxis - TED hose and SCDs, Heparin Weight-Bearing as tolerated to right leg   T. Rachelle Hora, PA-C [place] 05/22/2018, 11:54 AM

## 2018-05-22 NOTE — Progress Notes (Signed)
PT Cancellation Note  Patient Details Name: Joseph Newman MRN: [id number] DOB: 03-10-1945   Cancelled Treatment:    Reason Eval/Treat Not Completed: Other (comment)(physicians in room) Upon PT attempt earlier this morning patient not available due to physicians in room. Potentially may be d/c to SNF today. Will attempt again at later time/date.   Janna Arch, PT, DPT   05/22/2018, 10:55 AM

## 2018-05-22 NOTE — Discharge Summary (Signed)
[facility] [place] NAME: Joseph Newman    MR#:  [id number]  DATE OF BIRTH:  1945-11-05  DATE OF ADMISSION:  05/15/2018 ADMITTING PHYSICIAN: Sela Hua, MD  DATE OF DISCHARGE: 05/22/2018   PRIMARY CARE PHYSICIAN: Steele Sizer, MD    ADMISSION DIAGNOSIS:  ESRD (end stage renal disease) on dialysis ([place]) [N18.6, Z99.2] Acute respiratory failure with hypoxia ([place]) [J96.01] Closed fracture of right hip, initial encounter ([place]) [S72.001A]  DISCHARGE DIAGNOSIS:  Active Problems:   Closed right hip fracture ([place])   SECONDARY DIAGNOSIS:   Past Medical History:  Diagnosis Date  . Acute metabolic encephalopathy 59/16/3846   Per my chart  . Arthritis   . Chronic airway obstruction, not elsewhere classified    worked in [employer] and was exposed to [employer]  . 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 Holley Raring, nephrologist.  on dialysis tu - thur - sat  . Depressive disorder, not elsewhere classified   . Dialysis patient [facility]) 01/2018   dialysis tuesday - thursday - saturday  . Gout   . Gout   . Heart murmur    followed by dr. Clayborn Bigness  . Hyperlipidemia   . Hypertension   . Hypertrophy of prostate without urinary obstruction and other lower urinary tract symptoms (LUTS)   . Impotence of organic origin   . Microalbuminuria   . 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 Obesity, unspecified   . Plantar wart   . Sleep apnea    years ago but never used a cpap  . Synovitis and tenosynovitis, unspecified   . Type II or unspecified type diabetes mellitus without mention of complication, uncontrolled   . Unspecified venous (peripheral) insufficiency   . UTI (urinary tract infection)    frequent with ESBL    HOSPITAL COURSE:   Right hip fracture-s/p mechanical fall Patient already seen by orthopedic service on day 3 postop Pains appear controlled. Physical therapist at bedside  evaluating patient.  Anticipate discharge to skilled nursing facility after the weekend. Patient and his wife were initially refusing to go to a rehab facility and wanted to go home but today they agreed for the bed search. Approved at rehab- [facility] -19 per the rehab's policy for admission- negative. ( tested for SARS cov- 2 which is the test for COVID 19 virus)  ESRD on HD TTS-chest x-ray with mild pulmonary edema. Patient on 2 L O2.  Wean off oxygen as tolerated. Nephrology service following. COtn HD on Tuesday, Thursday, Saturday at A dialysis facility at [facility] ( I was informed , they don't sent pts to his own facility from this rehab place, )  Fevers;  Temperature of 100.6 postoperatively. Patient has remained afebrile since 2 days.  Chest x-ray done  with similar appearance with prior studies with findings consistent with mild pulmonary edema or multifocal infection.  No shortness of breath.  Patient was placed empirically on IV antibiotics with IV Zosyn pending blood culture results. Pneumonia was suspected but ruled out.  Patient does not have symptoms. Stop IV antibiotics.  Blood cultures are negative. His procalcitonin is higher but that could be due to renal failure issues.  We will continue monitoring off antibiotic. WBC count is normal.  Hypertension-blood pressures was on the lower side  -Decreased dose on Coreg,  Hold olmesartan, amlodipine, HCTZ - now stable- so just discharge on coreg and added lasix.  Type2 diabetes-blood sugars  controlled  -SSI  Hyperlipidemia-stable -Continue home statin  BPH-stable -Continue home Flomax  DVT prophylaxis- patient started on Lovenox already.  Also has SCD in place.      DISCHARGE CONDITIONS:   Stable.  CONSULTS OBTAINED:  Treatment Team:  Hessie Knows, MD Murlean Iba, MD  DRUG ALLERGIES:  No Known Allergies  DISCHARGE MEDICATIONS:   Allergies as of 05/22/2018   No Known Allergies      Medication List    STOP taking these medications   Olmesartan-amLODIPine-HCTZ 40-10-25 MG Tabs     TAKE these medications   allopurinol 100 MG tablet Commonly known as:  ZYLOPRIM Take 0.5 tablets (50 mg total) by mouth every other day.   aspirin EC 81 MG tablet Take 81 mg by mouth daily.   atorvastatin 40 MG tablet Commonly known as:  LIPITOR Take 1 tablet (40 mg total) by mouth daily.   calcitRIOL 0.25 MCG capsule Commonly known as:  ROCALTROL Take 0.25 mcg by mouth daily.   carvedilol 6.25 MG tablet Commonly known as:  COREG Take 1 tablet (6.25 mg total) by mouth 2 (two) times daily with a meal.   cholecalciferol 25 MCG (1000 UT) tablet Commonly known as:  VITAMIN D3 Take 2 capsules by mouth daily.   docusate sodium 100 MG capsule Commonly known as:  COLACE Take 100 mg by mouth daily.   enoxaparin 40 MG/0.4ML injection Commonly known as:  LOVENOX Inject 0.4 mLs (40 mg total) into the skin daily.   furosemide 20 MG tablet Commonly known as:  LASIX Take 1 tablet (20 mg total) by mouth daily. Start taking on:  May 23, 2018   MULTIVITAMINS PO Take 1 tablet by mouth daily.   oxyCODONE 5 MG immediate release tablet Commonly known as:  Oxy IR/ROXICODONE Take 1 tablet (5 mg total) by mouth every 4 (four) hours as needed for moderate pain, severe pain or breakthrough pain.   polyethylene glycol 17 g packet Commonly known as:  MIRALAX / GLYCOLAX Take 17 g by mouth daily as needed for mild constipation.   tamsulosin 0.4 MG Caps capsule Commonly known as:  FLOMAX TAKE 1 CAPSULE (0.4 MG TOTAL) BY MOUTH DAILY AFTER BREAKFAST.   traMADol 50 MG tablet Commonly known as:  ULTRAM Take 1 tablet (50 mg total) by mouth every 6 (six) hours as needed (mild pain).   Trulicity 1.5 XB/9.3JQ Sopn Generic drug:  Dulaglutide Inject 1.5 mg into the skin See admin instructions. Injects on Sundays   Vitamin B-12 1000 MCG Subl Take 1 tablet by mouth daily.         DISCHARGE INSTRUCTIONS:    Follow with PMD in 1-2 weeks. Ortho in 2 weeks.  If you experience worsening of your admission symptoms, develop shortness of breath, life threatening emergency, suicidal or homicidal thoughts you must seek medical attention immediately by calling 911 or calling your MD immediately  if symptoms less severe.  You Must read complete instructions/literature along with all the possible adverse reactions/side effects for all the Medicines you take and that have been prescribed to you. Take any new Medicines after you have completely understood and accept all the possible adverse reactions/side effects.   Please note  You were cared for by a hospitalist during your hospital stay. If you have any questions about your discharge medications or the care you received while you were in the hospital after you are discharged, you can call the unit and asked to speak with the hospitalist on call if the  hospitalist that took care of you is not available. Once you are discharged, your primary care physician will handle any further medical issues. Please note that NO REFILLS for any discharge medications will be authorized once you are discharged, as it is imperative that you return to your primary care physician (or establish a relationship with a primary care physician if you do not have one) for your aftercare needs so that they can reassess your need for medications and monitor your lab values.    Today   CHIEF COMPLAINT:   Chief Complaint  Patient presents with  . Fall    HISTORY OF PRESENT ILLNESS:  Joseph Newman  is a 73 y.o. male with a known history of ESRD on HD, hypertension, hyperlipidemia, type 2 diabetes, depression who presented to the ED with right hip pain after a mechanical fall at home.  He states he was trying to sit down on a chair, when he completely missed the chair and landed on his bottom and right hip.  He denies any head trauma or loss of  consciousness.  He states he does not have any hip pain when he lays completely still, but he has pretty significant pain whenever he tries to move his legs.  He denies any numbness or tingling of his lower extremities.  He denies any chest pain or shortness of breath.  In the ED, he had desaturations to 88% on room air and was placed on 2 L O2.  Labs are significant for K 5.2.  Chest x-ray with pulmonary venous congestion and mild edema.  Right hip x-ray with acute nondisplaced right subcapital femoral neck fracture.  Hospitalists were called for admission.   VITAL SIGNS:  Blood pressure 109/67, pulse (!) 125, temperature 97.8 F (36.6 C), resp. rate 17, height 6\' 5"  (1.956 m), weight 113.2 kg, SpO2 96 %.  I/O:   No intake or output data in the 24 hours ending 05/22/18 1136  PHYSICAL EXAMINATION:   Constitutional: He is oriented to person, place, and time. He appears well-developed and well-nourished.  HENT:  Head: Normocephalic and atraumatic.  Right Ear: External ear normal.  Eyes: Pupils are equal, round, and reactive to light. Conjunctivae are normal. Right eye exhibits no discharge.  Neck: Normal range of motion. No thyromegaly present.  Cardiovascular: Normal rate, regular rhythm and normal heart sounds.  Respiratory: Effort normal. No respiratory distress. He has no wheezes. He has rales.  GI: Soft. Bowel sounds are normal. He exhibits no distension.  Musculoskeletal: Normal range of motion.        General: No edema.  Neurological: He is alert and oriented to person, place, and time.  Generalized weakness  Skin: Skin is warm. He is not diaphoretic. No erythema.   DATA REVIEW:   CBC Recent Labs  Lab 05/20/18 0436  WBC 7.6  HGB 10.4*  HCT 32.0*  PLT 188    Chemistries  Recent Labs  Lab 05/15/18 1705  05/18/18 0449 05/20/18 0436  NA 135   < > 138 136  K 5.2*   < > 4.6 4.9  CL 96*   < > 100 100  CO2 25   < > 25 25  GLUCOSE 146*   < > 141* 139*  BUN 41*   < >  54* 52*  CREATININE 5.23*   < > 6.18* 6.41*  CALCIUM 8.4*   < > 8.2* 8.8*  MG  --    < > 2.0  --   AST 19  --   --   --  ALT 13  --   --   --   ALKPHOS 87  --   --   --   BILITOT 0.9  --   --   --    < > = values in this interval not displayed.    Cardiac Enzymes No results for input(s): TROPONINI in the last 168 hours.  Microbiology Results  Results for orders placed or performed during the hospital encounter of 05/15/18  Surgical PCR screen     Status: Abnormal   Collection Time: 05/15/18 10:05 PM  Result Value Ref Range Status   MRSA, PCR POSITIVE (A) NEGATIVE Final    Comment: RESULT CALLED TO, READ BACK BY AND VERIFIED WITH: Kathyrn Drown RN 1964 05/15/2018 HNM    Staphylococcus aureus POSITIVE (A) NEGATIVE Final    Comment: (NOTE) The Xpert SA Assay (FDA approved for NASAL specimens in patients 8 years of age and older), is one component of a comprehensive surveillance program. It is not intended to diagnose infection nor to guide or monitor treatment. Performed at [facility], [address] [place], [place] [address]   CULTURE, BLOOD (ROUTINE X 2) w Reflex to ID Panel     Status: None   Collection Time: 05/16/18 10:22 AM  Result Value Ref Range Status   Specimen Description BLOOD LEFT ARM  Final   Special Requests   Final    BOTTLES DRAWN AEROBIC AND ANAEROBIC Blood Culture adequate volume   Culture   Final    NO GROWTH 5 DAYS Performed at [facility], [address] [place], [place] [address]    Report Status 05/21/2018 FINAL  Final  CULTURE, BLOOD (ROUTINE X 2) w Reflex to ID Panel     Status: None   Collection Time: 05/16/18 10:29 AM  Result Value Ref Range Status   Specimen Description BLOOD LEFT ANTECUBITAL  Final   Special Requests   Final    BOTTLES DRAWN AEROBIC AND ANAEROBIC Blood Culture adequate volume   Culture   Final    NO GROWTH 5 DAYS Performed at [facility], [address] [place], [place] [address]     Report Status 05/21/2018 FINAL  Final  SARS Coronavirus 2 [facility] order, Performed in [place] hospital lab)     Status: None   Collection Time: 05/21/18 11:22 AM  Result Value Ref Range Status   SARS Coronavirus 2 NEGATIVE NEGATIVE Final    Comment: (NOTE) If result is NEGATIVE SARS-CoV-2 target nucleic acids are NOT DETECTED. The SARS-CoV-2 RNA is generally detectable in upper and lower  respiratory specimens during the acute phase of infection. The lowest  concentration of SARS-CoV-2 viral copies this assay can detect is 250  copies / mL. A negative result does not preclude SARS-CoV-2 infection  and should not be used as the sole basis for treatment or other  patient management decisions.  A negative result may occur with  improper specimen collection / handling, submission of specimen other  than nasopharyngeal swab, presence of viral mutation(s) within the  areas targeted by this assay, and inadequate number of viral copies  (<250 copies / mL). A negative result must be combined with clinical  observations, patient history, and epidemiological information. If result is POSITIVE SARS-CoV-2 target nucleic acids are DETECTED. The SARS-CoV-2 RNA is generally detectable in upper and lower  respiratory specimens dur ing the acute phase of infection.  Positive  results are indicative of active infection with SARS-CoV-2.  Clinical  correlation with  patient history and other diagnostic information is  necessary to determine patient infection status.  Positive results do  not rule out bacterial infection or co-infection with other viruses. If result is PRESUMPTIVE POSTIVE SARS-CoV-2 nucleic acids MAY BE PRESENT.   A presumptive positive result was obtained on the submitted specimen  and confirmed on repeat testing.  While 2019 novel coronavirus  (SARS-CoV-2) nucleic acids may be present in the submitted sample  additional confirmatory testing may be necessary for epidemiological   and / or clinical management purposes  to differentiate between  SARS-CoV-2 and other Sarbecovirus currently known to infect humans.  If clinically indicated additional testing with an alternate test  methodology [id number]) is advised. The SARS-CoV-2 RNA is generally  detectable in upper and lower respiratory sp ecimens during the acute  phase of infection. The expected result is Negative. Fact Sheet for Patients:  StrictlyIdeas.no Fact Sheet for Healthcare Providers: BankingDealers.co.za This test is not yet approved or cleared by the [place] FDA and has been authorized for detection and/or diagnosis of SARS-CoV-2 by FDA under an Emergency Use Authorization (EUA).  This EUA will remain in effect (meaning this test can be used) for the duration of the COVID-19 declaration under Section 564(b)(1) of the Act, 21 U.S.C. section 360bbb-3(b)(1), unless the authorization is terminated or revoked sooner. Performed at [facility], [address], [place], [place] [address]     RADIOLOGY:  No results found.  EKG:   Orders placed or performed during the hospital encounter of 05/15/18  . EKG 12-Lead  . EKG 12-Lead      Management plans discussed with the patient, family and they are in agreement.  CODE STATUS:     Code Status Orders  (From admission, onward)         Start     Ordered   05/15/18 2116  Full code  Continuous     05/15/18 2116        Code Status History    Date Active Date Inactive Code Status Order ID Comments User Context   11/01/2017 1835 11/07/2017 2158 DNR [id number]  Demetrios Loll, MD Inpatient   09/25/2017 1616 09/29/2017 2043 Full Code [id number]  Epifanio Lesches, MD ED      TOTAL TIME TAKING CARE OF THIS PATIENT: 35 minutes.    Vaughan Basta M.D on 05/22/2018 at 11:36 AM  Between 7am to 6pm - Pager - [phone]  After 6pm go to [email or web address] - password [email or web address] [place] [facility]  Office  [phone]  CC: Primary care physician; Steele Sizer, MD   Note: This dictation was prepared with Dragon dictation along with smaller phrase technology. Any transcriptional errors that result from this process are unintentional.

## 2018-05-22 NOTE — TOC Progression Note (Signed)
Transition of Care [facility]) - Progression Note    Patient Details  Name: Joseph Newman MRN: [id number] Date of Birth: July 03, 1945  Transition of Care [facility]) CM/SW Contact  Su Hilt, RN Phone Number: 05/22/2018, 11:24 AM  Clinical Narrative:    Damaris Schooner with Estill Bamberg at [facility], she is getting dialysis set up for the patient to go to [facility]    Expected Discharge Plan: [place] Barriers to Discharge: Continued Medical Work up  Expected Discharge Plan and Services Expected Discharge Plan: [place] Choice: [place] arrangements for the past 2 months: Single Family Home Expected Discharge Date: 05/21/18                           [place] Date [place]: 05/17/18 Time [place]: 72 Representative spoke with at [place]: Cassie    Social Determinants of Health ([place]) Interventions    Readmission Risk Interventions Readmission Risk Prevention Plan 05/17/2018  Transportation Screening Complete  Medication Review Press photographer) Complete  HRI or [place] Complete  [place] Patient Refused  Some recent data might be hidden

## 2018-05-22 NOTE — TOC Progression Note (Signed)
Transition of Care [facility]) - Progression Note    Patient Details  Name: Joseph Newman MRN: [id number] Date of Birth: 09/09/1945  Transition of Care [facility]) CM/SW Contact  Su Hilt, RN Phone Number: 05/22/2018, 9:31 AM  Clinical Narrative:    Andi Hence called and asked that I call Celeste at [facility] and give information at [phone], I called Devita and explained that [facility] would not transport the patient to [facility] for Dialysis she gave me the phone number that Miquel Dunn can call to get the records for the dialysis the number  To call is [phone]   Expected Discharge Plan: [place] Barriers to Discharge: Continued Medical Work up  Expected Discharge Plan and Services Expected Discharge Plan: [place] Choice: [place] arrangements for the past 2 months: Single Family Home Expected Discharge Date: 05/21/18                           [facility] Agency: [facility] Date [place]: 05/17/18 Time [place]: 93 Representative spoke with at [place] Determinants of Health ([place]) Interventions    Readmission Risk Interventions Readmission Risk Prevention Plan 05/17/2018  Transportation Screening Complete  Medication Review (RN Care Manager) Complete  HRI or [place] Patient Refused  Some recent data might be hidden

## 2018-05-23 DIAGNOSIS — L8990 Pressure ulcer of unspecified site, unspecified stage: Secondary | ICD-10-CM

## 2018-05-23 LAB — CBC
HCT: 32.6 % — ABNORMAL LOW (ref 39.0–52.0)
Hemoglobin: 10.5 g/dL — ABNORMAL LOW (ref 13.0–17.0)
MCH: 28.6 pg (ref 26.0–34.0)
MCHC: 32.2 g/dL (ref 30.0–36.0)
MCV: 88.8 fL (ref 80.0–100.0)
Platelets: 217 10*3/uL (ref 150–400)
RBC: 3.67 MIL/uL — ABNORMAL LOW (ref 4.22–5.81)
RDW: 14.8 % (ref 11.5–15.5)
WBC: 7.5 10*3/uL (ref 4.0–10.5)
nRBC: 0 % (ref 0.0–0.2)

## 2018-05-23 LAB — HEPATITIS PANEL, ACUTE
HCV Ab: 0.1 s/co ratio (ref 0.0–0.9)
Hep A IgM: NEGATIVE
Hep B C IgM: NEGATIVE
Hepatitis B Surface Ag: NEGATIVE

## 2018-05-23 LAB — CREATININE, SERUM
Creatinine, Ser: 5.89 mg/dL — ABNORMAL HIGH (ref 0.61–1.24)
GFR calc Af Amer: 10 mL/min — ABNORMAL LOW (ref >60)
GFR calc non Af Amer: 9 mL/min — ABNORMAL LOW (ref >60)

## 2018-05-23 LAB — GLUCOSE, CAPILLARY
Glucose-Capillary: 135 mg/dL — ABNORMAL HIGH (ref 70–99)
Glucose-Capillary: 121 mg/dL — ABNORMAL HIGH (ref 70–99)
Glucose-Capillary: 174 mg/dL — ABNORMAL HIGH (ref 70–99)

## 2018-05-23 LAB — PHOSPHORUS: Phosphorus: 5.3 mg/dL — ABNORMAL HIGH (ref 2.5–4.6)

## 2018-05-23 LAB — TSH: TSH: 1.207 u[IU]/mL (ref 0.350–4.500)

## 2018-05-23 LAB — HEPATITIS B SURFACE ANTIGEN: Hepatitis B Surface Ag: NEGATIVE

## 2018-05-23 MED ORDER — METOPROLOL TARTRATE 25 MG PO TABS
12.5000 mg | ORAL_TABLET | Freq: Two times a day (BID) | ORAL | Status: DC
  Filled 2018-05-23: qty 1

## 2018-05-23 MED ORDER — DILTIAZEM HCL 30 MG PO TABS
60.0000 mg | ORAL_TABLET | Freq: Three times a day (TID) | ORAL | Status: DC
  Administered 2018-05-23 – 2018-05-24 (×3): 60 mg via ORAL
  Filled 2018-05-23 (×5): qty 2

## 2018-05-23 NOTE — Progress Notes (Signed)
OT Cancellation Note  Patient Details Name: Joseph Newman MRN: [id number] DOB: 11/10/1945   Cancelled Treatment:    Reason Eval/Treat Not Completed: Patient at procedure or test/ unavailable(Pt. is at Dialysis. Will continue to monitor, and treat at a later time/date.)  Harrel Carina, MS, OTR/L 05/23/2018, 12:24 PM

## 2018-05-23 NOTE — Progress Notes (Signed)
PT Cancellation Note  Patient Details Name: Joseph Newman MRN: [id number] DOB: Jun 08, 1945   Cancelled Treatment:    Reason Eval/Treat Not Completed: Patient at procedure or test/unavailable(patient at HD)   Everlean Alstrom. Graylon Good, PT, DPT 05/23/18, 4:01 PM

## 2018-05-23 NOTE — Progress Notes (Signed)
[place], [place] 05/23/18  Subjective:  Patient seen and evaluated during hemodialysis treatment. Tolerating well.    HEMODIALYSIS FLOWSHEET:  Blood Flow Rate (mL/min): 400 mL/min Arterial Pressure (mmHg): -210 mmHg Venous Pressure (mmHg): 230 mmHg Transmembrane Pressure (mmHg): 50 mmHg Ultrafiltration Rate (mL/min): 670 mL/min Dialysate Flow Rate (mL/min): 800 ml/min Conductivity: Machine : 13.8 Conductivity: Machine : 13.8 Dialysis Fluid Bolus: Normal Saline Bolus Amount (mL): 250 mL     Objective:  Vital signs in last 24 hours:  Temp:  [97.8 F (36.6 C)-98.1 F (36.7 C)] (P) 97.8 F (36.6 C) (04/30 1000) Pulse Rate:  [106-124] (P) 124 (04/30 1000) Resp:  [18-24] (P) 20 (04/30 1000) BP: (108-131)/(57-81) (P) 104/62 (04/30 1000) SpO2:  [92 %-97 %] (P) 95 % (04/30 1000) Weight:  [114.8 kg] (P) 114.8 kg (04/30 1000)  Weight change:  Filed Weights   05/21/18 1647 05/21/18 1722 05/23/18 1000  Weight: 113.2 kg 113.2 kg (P) 114.8 kg    Intake/Output:   No intake or output data in the 24 hours ending 05/23/18 1141   Physical Exam: General:  Chronically ill-appearing, laying in the bed  HEENT  anicteric, moist oral mucous membranes  Neck  supple  Pulm/lungs  normal breathing effort, mild crackles at bases b/l  CVS/Heart  irregular, no rub  Abdomen:   Soft, nontender  Extremities:  Trace peripheral edema  Neurologic:  awake, alert, follows commands  Skin:  No acute rashes  Access:  Right arm AV fistula, good bruit       Basic Metabolic Panel:  Recent Labs  Lab 05/16/18 1647 05/17/18 0539 05/18/18 0449 05/20/18 0436 05/23/18 0423  NA 138 138 138 136  --   K 4.1 4.4 4.6 4.9  --   CL  --  98 100 100  --   CO2  --  24 25 25   --   GLUCOSE 123* 155* 141* 139*  --   BUN  --  40* 54* 52*  --   CREATININE  --  5.30* 6.18* 6.41* 5.89*  CALCIUM  --  8.1* 8.2* 8.8*  --   MG  --  1.9 2.0  --   --   PHOS  --  5.2* 6.3*  --   --       CBC: Recent Labs  Lab 05/17/18 0539 05/18/18 0449 05/19/18 0534 05/20/18 0436 05/23/18 0423  WBC 9.5 12.1* 8.8 7.6 7.5  HGB 11.6* 8.6* 9.9* 10.4* 10.5*  HCT 35.4* 27.0* 30.9* 32.0* 32.6*  MCV 86.3 88.2 88.5 87.9 88.8  PLT 188 190 167 188 217      Lab Results  Component Value Date   HEPBSAG Negative 05/22/2018   HEPBSAG Negative 05/22/2018   HEPBIGM Negative 05/22/2018      Microbiology:  Recent Results (from the past 240 hour(s))  Surgical PCR screen     Status: Abnormal   Collection Time: 05/15/18 10:05 PM  Result Value Ref Range Status   MRSA, PCR POSITIVE (A) NEGATIVE Final    Comment: RESULT CALLED TO, READ BACK BY AND VERIFIED WITH: Kathyrn Drown RN [id number] 05/15/2018 HNM    Staphylococcus aureus POSITIVE (A) NEGATIVE Final    Comment: (NOTE) The Xpert SA Assay (FDA approved for NASAL specimens in patients 1 years of age and older), is one component of a comprehensive surveillance program. It is not intended to diagnose infection nor to guide or monitor treatment. Performed at [facility], [address], [place], [place] [address]  CULTURE, BLOOD (ROUTINE X 2) w Reflex to ID Panel     Status: None   Collection Time: 05/16/18 10:22 AM  Result Value Ref Range Status   Specimen Description BLOOD LEFT ARM  Final   Special Requests   Final    BOTTLES DRAWN AEROBIC AND ANAEROBIC Blood Culture adequate volume   Culture   Final    NO GROWTH 5 DAYS Performed at [facility], [address] [place], [place] [address]    Report Status 05/21/2018 FINAL  Final  CULTURE, BLOOD (ROUTINE X 2) w Reflex to ID Panel     Status: None   Collection Time: 05/16/18 10:29 AM  Result Value Ref Range Status   Specimen Description BLOOD LEFT ANTECUBITAL  Final   Special Requests   Final    BOTTLES DRAWN AEROBIC AND ANAEROBIC Blood Culture adequate volume   Culture   Final    NO GROWTH 5 DAYS Performed at [facility], [address], [place], [place] [address]    Report Status 05/21/2018 FINAL  Final  SARS Coronavirus 2 [facility] order, Performed in [place] hospital lab)     Status: None   Collection Time: 05/21/18 11:22 AM  Result Value Ref Range Status   SARS Coronavirus 2 NEGATIVE NEGATIVE Final    Comment: (NOTE) If result is NEGATIVE SARS-CoV-2 target nucleic acids are NOT DETECTED. The SARS-CoV-2 RNA is generally detectable in upper and lower  respiratory specimens during the acute phase of infection. The lowest  concentration of SARS-CoV-2 viral copies this assay can detect is 250  copies / mL. A negative result does not preclude SARS-CoV-2 infection  and should not be used as the sole basis for treatment or other  patient management decisions.  A negative result may occur with  improper specimen collection / handling, submission of specimen other  than nasopharyngeal swab, presence of viral mutation(s) within the  areas targeted by this assay, and inadequate number of viral copies  (<250 copies / mL). A negative result must be combined with clinical  observations, patient history, and epidemiological information. If result is POSITIVE SARS-CoV-2 target nucleic acids are DETECTED. The SARS-CoV-2 RNA is generally detectable in upper and lower  respiratory specimens dur ing the acute phase of infection.  Positive  results are indicative of active infection with SARS-CoV-2.  Clinical  correlation with patient history and other diagnostic information is  necessary to determine patient infection status.  Positive results do  not rule out bacterial infection or co-infection with other viruses. If result is PRESUMPTIVE POSTIVE SARS-CoV-2 nucleic acids MAY BE PRESENT.   A presumptive positive result was obtained on the submitted specimen  and confirmed on repeat testing.  While 2019 novel coronavirus  (SARS-CoV-2) nucleic acids may be present in the submitted sample  additional confirmatory testing may be  necessary for epidemiological  and / or clinical management purposes  to differentiate between  SARS-CoV-2 and other Sarbecovirus currently known to infect humans.  If clinically indicated additional testing with an alternate test  methodology [phone]) is advised. The SARS-CoV-2 RNA is generally  detectable in upper and lower respiratory sp ecimens during the acute  phase of infection. The expected result is Negative. Fact Sheet for Patients:  StrictlyIdeas.no Fact Sheet for Healthcare Providers: BankingDealers.co.za This test is not yet approved or cleared by the [place] FDA and has been authorized for detection and/or diagnosis of SARS-CoV-2 by FDA under an Emergency Use Authorization (EUA).  This EUA will  remain in effect (meaning this test can be used) for the duration of the COVID-19 declaration under Section 564(b)(1) of the Act, 21 U.S.C. section 360bbb-3(b)(1), unless the authorization is terminated or revoked sooner. Performed at [facility], [address] [place], [place] [address]     Coagulation Studies: No results for input(s): LABPROT, INR in the last 72 hours.  Urinalysis: No results for input(s): COLORURINE, LABSPEC, PHURINE, GLUCOSEU, HGBUR, BILIRUBINUR, KETONESUR, PROTEINUR, UROBILINOGEN, NITRITE, LEUKOCYTESUR in the last 72 hours.  Invalid input(s): APPERANCEUR    Imaging: No results found.   Medications:    . allopurinol  50 mg Oral QODAY  . aspirin EC  81 mg Oral Daily  . atorvastatin  40 mg Oral Daily  . calcium acetate  1,334 mg Oral TID WC  . Chlorhexidine Gluconate Cloth  6 each Topical Q0600  . docusate sodium  100 mg Oral BID  . epoetin (EPOGEN/PROCRIT) injection  4,000 Units Intravenous Q T,Th,Sa-HD  . heparin injection (subcutaneous)  5,000 Units Subcutaneous Q8H  . insulin aspart  0-5 Units Subcutaneous QHS  . insulin aspart  0-9 Units Subcutaneous TID WC  . metoprolol  tartrate  12.5 mg Oral BID  . tamsulosin  0.4 mg Oral QPC breakfast   acetaminophen **OR** acetaminophen, alum & mag hydroxide-simeth, bisacodyl, magnesium citrate, magnesium hydroxide, menthol-cetylpyridinium **OR** phenol, morphine injection, ondansetron **OR** ondansetron (ZOFRAN) IV, oxyCODONE, polyethylene glycol, traMADol  Assessment/ Plan:  73 y.o. African-American male with end-stage renal disease, depression, gout, BPH, history of urinary retention, followed by urologist Dr. Bernardo Heater, history of colonization of urine  Davita Graham/TTS/103.5 kg/Rt arm AVF/  1.  End-stage renal disease -Patient seen and evaluated during dialysis treatment today.  Tolerating well.  Ultrafiltration target 1 to 1.5 kg.  2.  Anemia of chronic kidney disease -Hemoglobin 10.5.  Maintain the patient on Epogen 4000 units IV with dialysis.  3.  Secondary hyperparathyroidism -Recheck serum phosphorus today.  Otherwise maintain the patient on calcium acetate 2 tablets p.o. 3 times daily with meals.  4.  Right hip fracture Underwent right hip pinning on 4/23  5. Hypoxia Patient changed to furosemide 20 mg p.o. daily.   LOS: 8 Marveen Donlon 4/30/202011:41 AM  [place], [place]  Note: This note was prepared with Dragon dictation. Any transcription errors are unintentional

## 2018-05-23 NOTE — Progress Notes (Signed)
Pre HD Assessment  Cannulated arterial and venous sites of AV fistula using 16G 1/2inch needles without diffiult(MD orders needles shorter than standard for this pt d/t torturous nature of dialysis access).   Pt remains tachycardic in 120's, MD aware. Metoprolol will be given after HD in order to maintain BP throughout.     05/23/18 1015  Neurological  Level of Consciousness Alert  Orientation Level Oriented to person;Oriented to place;Disoriented to time;Disoriented to situation  Respiratory  Respiratory Pattern Regular  Chest Assessment Chest expansion symmetrical  Bilateral Breath Sounds Clear;Diminished  Cough Non-productive  Cardiac  Pulse Irregular (tachy)  Jugular Venous Distention (JVD) No  ECG Monitor Yes  Cardiac Rhythm ST  Ectopy Multifocal PVC's  Ectopy Frequency Frequent  Antiarrhythmic device No  Vascular  R Radial Pulse +2  L Radial Pulse +2  R Dorsalis Pedis Pulse +1  L Dorsalis Pedis Pulse +1  Edema Generalized  Integumentary  Integumentary (WDL) X  Skin Color Appropriate for ethnicity  Skin Condition Dry;Flaky  Additional Integumentary Comments  (HD Pt)  Musculoskeletal  Musculoskeletal (WDL) X  Generalized Weakness Yes  Gastrointestinal  Bowel Sounds Assessment Active  GU Assessment  Genitourinary (WDL) X  Genitourinary Symptoms Other (Comment) (HD Pt)  Psychosocial  Psychosocial (WDL) WDL  Patient Behaviors Calm;Cooperative  Needs Expressed Denies  Emotional support given Given to patient  Wound/Incision (LDAs)  Type of Wound/Incision (LDA) Incision  Pressure Injury 05/23/18 Stage II -  Partial thickness loss of dermis presenting as a shallow open ulcer with a red, pink wound bed without slough. shallow open ulcer pink wound bed  Date First Assessed/Time First Assessed: 05/23/18 0236   Location: Coccyx  Location Orientation: Mid  Staging: Stage II -  Partial thickness loss of dermis presenting as a shallow open ulcer with a red, pink wound bed  without slough.  Wound Descriptio...  Dressing Type Foam  Dressing Clean;Dry;Intact  Dressing Change Frequency Every 3 days  Drainage Amount None  Incision (Closed) 05/16/18 Hip Right  Date First Assessed/Time First Assessed: 05/16/18 1748   Location: Hip  Location Orientation: Right  Dressing Type Honeycomb  Dressing Clean;Dry;Intact  Site / Wound Assessment Dressing in place / Unable to assess  Drainage Amount None

## 2018-05-23 NOTE — Progress Notes (Signed)
Pre HD Tx   05/23/18 1000  Hand-Off documentation  Report given to (Full Name) Trellis Paganini RN  Report received from (Full Name) Saundra Shelling RN  Vital Signs  Temp 97.8 F (36.6 C)  Temp Source Oral  Pulse Rate (!) 124  Pulse Rate Source Monitor  Resp 20  BP 104/62  BP Location Left Arm  BP Method Automatic  Patient Position (if appropriate) Lying  Oxygen Therapy  SpO2 95 %  O2 Device Nasal Cannula  O2 Flow Rate (L/min) 2 L/min  Pain Assessment  Pain Scale 0-10  Pain Score 0  Dialysis Weight  Weight 114.8 kg  Type of Weight Pre-Dialysis  Time-Out for Hemodialysis  What Procedure? hemodialysis   Pt Identifiers(min of two) First/Last Name;MRN/Account#  Correct Site? Yes  Correct Side? Yes  Correct Procedure? Yes  Consents Verified? Yes  Rad Studies Available? N/A  Safety Precautions Reviewed? Yes  Engineer, civil (consulting) Number 4  Station Number 4  UF/Alarm Test Passed  Conductivity: Meter 14  Conductivity: Machine  14  pH 7.4  Reverse Osmosis Main  Normal Saline Lot Number [id number]  Dialyzer Lot Number [id number]  Disposable Set Lot Number [id number]  Machine Temperature 98.6 F (37 C)  Musician and Audible Yes  Blood Lines Intact and Secured Yes  Pre Treatment Patient Checks  Vascular access used during treatment Fistula  Patient is receiving dialysis in a chair Yes  Hepatitis B Surface Antigen Results Negative  Date Hepatitis B Surface Antigen Drawn 05/22/18  Isolation Initiated Yes  Hepatitis B Surface Antibody  (<10)  Date Hepatitis B Surface Antibody Drawn 05/22/18  Hemodialysis Consent Verified Yes  Hemodialysis Standing Orders Initiated Yes  ECG (Telemetry) Monitor On Yes  Prime Ordered Normal Saline  Length of  DialysisTreatment -hour(s) 3.5 Hour(s)  Dialysis Treatment Comments Na140  Dialyzer Elisio 17H NR  Dialysate 2K, 2.5 Ca  Dialysate Flow Ordered 800  Blood Flow Rate Ordered 350 mL/min  Ultrafiltration Goal 1.5 Liters  Pre  Treatment Labs Phosphorus  Dialysis Blood Pressure Support Ordered Normal Saline  Education / Care Plan  Dialysis Education Provided Yes  Documented Education in Care Plan Yes  Fistula / Graft Right Upper arm Arteriovenous fistula  Placement Date: 05/15/18   Placed prior to admission: Yes  Orientation: Right  Access Location: Upper arm  Access Type: Arteriovenous fistula  Site Condition No complications  Fistula / Graft Assessment Present;Thrill;Bruit  Status Accessed  Needle Size 16, short needles  Drainage Description None

## 2018-05-23 NOTE — TOC Progression Note (Signed)
Transition of Care [facility]) - Progression Note    Patient Details  Name: Joseph Newman MRN: [id number] Date of Birth: 25-Aug-1945  Transition of Care [facility]) CM/SW Contact  Su Hilt, RN Phone Number: 05/23/2018, 4:50 PM  Clinical Narrative:    [facility] has offered a chair time Tuesday thur sat at 1220, has to be there Sat by [facility] know of the chair time and that it is the center they requested and that they need to be at the dialysis center by 1130 sat     Expected Discharge Plan: [place] Barriers to Discharge: Continued Medical Work up  Expected Discharge Plan and Services Expected Discharge Plan: [place] Choice: [place] arrangements for the past 2 months: Single Family Home Expected Discharge Date: 05/22/18                           [place] Date [place]: 05/17/18 Time [place]: 84 Representative spoke with at [place]: Cassie    Social Determinants of Health (SDOH) Interventions    Readmission Risk Interventions Readmission Risk Prevention Plan 05/17/2018  Transportation Screening Complete  Medication Review Press photographer) Complete  HRI or [place] Patient Refused  Some recent data might be hidden

## 2018-05-23 NOTE — Progress Notes (Signed)
Post HD Assessment  Pt to receive Metoprolol post tx for elevated/irreg HR. Tolerated tx well, 1.5 liters removed.     05/23/18 1354  Neurological  Level of Consciousness Alert  Orientation Level Oriented to person;Oriented to place;Disoriented to time;Disoriented to situation  Respiratory  Respiratory Pattern Regular  Chest Assessment Chest expansion symmetrical  Bilateral Breath Sounds Clear;Diminished  Cough Non-productive  Cardiac  Pulse Irregular (tachy)  Jugular Venous Distention (JVD) No  ECG Monitor Yes  Cardiac Rhythm ST  Ectopy Multifocal PVC's  Ectopy Frequency Frequent  Antiarrhythmic device No  Vascular  R Radial Pulse +2  L Radial Pulse +2  R Dorsalis Pedis Pulse +1  L Dorsalis Pedis Pulse +1  Edema Generalized  Integumentary  Integumentary (WDL) X  Skin Color Appropriate for ethnicity  Skin Condition Dry;Flaky  Musculoskeletal  Musculoskeletal (WDL) X  Generalized Weakness Yes  Gastrointestinal  Bowel Sounds Assessment Active  GU Assessment  Genitourinary (WDL) X  Genitourinary Symptoms Other (Comment) (HD Pt)  Psychosocial  Psychosocial (WDL) WDL  Patient Behaviors Calm;Cooperative  Needs Expressed Denies  Emotional support given Given to patient  Wound/Incision (LDAs)  Type of Wound/Incision (LDA) Incision  Pressure Injury 05/23/18 Stage II -  Partial thickness loss of dermis presenting as a shallow open ulcer with a red, pink wound bed without slough. shallow open ulcer pink wound bed  Date First Assessed/Time First Assessed: 05/23/18 0236   Location: Coccyx  Location Orientation: Mid  Staging: Stage II -  Partial thickness loss of dermis presenting as a shallow open ulcer with a red, pink wound bed without slough.  Wound Descriptio...  Dressing Type Foam  Dressing Clean;Dry;Intact  Dressing Change Frequency Every 3 days  Drainage Amount None  Incision (Closed) 05/16/18 Hip Right  Date First Assessed/Time First Assessed: 05/16/18 1748    Location: Hip  Location Orientation: Right  Dressing Type Honeycomb  Dressing Clean;Dry;Intact  Site / Wound Assessment Dressing in place / Unable to assess  Drainage Amount None

## 2018-05-23 NOTE — Progress Notes (Signed)
PT Cancellation Note  Patient Details Name: Joseph Newman MRN: [id number] DOB: Nov 25, 1945   Cancelled Treatment:    Reason Eval/Treat Not Completed: Patient at procedure or test/unavailable(patient at HD)  Everlean Alstrom. Graylon Good, PT, DPT 05/23/18, 12:01 PM

## 2018-05-23 NOTE — Progress Notes (Signed)
HD Tx Start  Cannulated arterial and venous sites of AV fistula using 16G 1/2inch needles without diffiult(MD orders needles shorter than standard for this pt d/t torturous nature of dialysis access).   Pt remains tachycardic in 120's, MD aware. Metoprolol will be given after HD in order to maintain BP throughout.     05/23/18 1015  Vital Signs  Pulse Rate (!) 126  Resp (!) 22  BP 106/66  Oxygen Therapy  SpO2 96 %  During Hemodialysis Assessment  Blood Flow Rate (mL/min) 350 mL/min  Arterial Pressure (mmHg) -180 mmHg  Venous Pressure (mmHg) 180 mmHg  Transmembrane Pressure (mmHg) 40 mmHg  Ultrafiltration Rate (mL/min) 600 mL/min  Dialysate Flow Rate (mL/min) 800 ml/min  Conductivity: Machine  14  HD Safety Checks Performed Yes  Dialysis Fluid Bolus Normal Saline  Bolus Amount (mL) 250 mL  Intra-Hemodialysis Comments Tx initiated

## 2018-05-23 NOTE — Progress Notes (Signed)
[place] at [place] NAME: Joseph Newman    MR#:  [id number]  DATE OF BIRTH:  03-18-1945  SUBJECTIVE:  CHIEF COMPLAINT:   Chief Complaint  Patient presents with  . Fall   No new complaint this morning.  Worked with physical therapy.  Complain of pain with movements.  He is requiring 2 ltr oxygen , but not in any discomfort today.  Has tachycardia up to 120 today.  REVIEW OF SYSTEMS:  Review of Systems  Constitutional: Negative for chills and fever.  HENT: Negative for hearing loss and tinnitus.   Eyes: Negative for blurred vision and double vision.  Respiratory: Negative for cough and hemoptysis.   Cardiovascular: Negative for chest pain and palpitations.  Gastrointestinal: Negative for heartburn and nausea.  Genitourinary: Negative for dysuria and urgency.  Musculoskeletal: Negative for myalgias and neck pain.  Skin: Negative for itching and rash.  Neurological: Negative for dizziness and headaches.  Psychiatric/Behavioral: Negative for depression and hallucinations.    DRUG ALLERGIES:  No Known Allergies VITALS:  Blood pressure 96/66, pulse (!) 123, temperature 97.7 F (36.5 C), temperature source Oral, resp. rate (!) 22, height 6\' 5"  (1.956 m), weight 113.3 kg, SpO2 100 %. PHYSICAL EXAMINATION:   Physical Exam  Constitutional: He is oriented to person, place, and time. He appears well-developed and well-nourished.  HENT:  Head: Normocephalic and atraumatic.  Right Ear: External ear normal.  Eyes: Pupils are equal, round, and reactive to light. Conjunctivae are normal. Right eye exhibits no discharge.  Neck: Normal range of motion. No thyromegaly present.  Cardiovascular: Regular rhythm and normal heart sounds.  tachycardia  Respiratory: Effort normal. No respiratory distress. He has no wheezes. He has rales.  GI: Soft. Bowel sounds are normal. He exhibits no distension.  Musculoskeletal: Normal range of motion.      General: No edema.  Neurological: He is alert and oriented to person, place, and time.  Generalized weakness  Skin: Skin is warm. He is not diaphoretic. No erythema.   LABORATORY PANEL:  Male CBC Recent Labs  Lab 05/23/18 0423  WBC 7.5  HGB 10.5*  HCT 32.6*  PLT 217   ------------------------------------------------------------------------------------------------------------------ Chemistries  Recent Labs  Lab 05/18/18 0449 05/20/18 0436 05/23/18 0423  NA 138 136  --   K 4.6 4.9  --   CL 100 100  --   CO2 25 25  --   GLUCOSE 141* 139*  --   BUN 54* 52*  --   CREATININE 6.18* 6.41* 5.89*  CALCIUM 8.2* 8.8*  --   MG 2.0  --   --    RADIOLOGY:  No results found. ASSESSMENT AND PLAN:   Right hip fracture-s/p mechanical fall Patient already seen by orthopedic service on day 3 postop Pains appear controlled. Physical therapist at bedside evaluating patient.  Anticipate discharge to skilled nursing facility after the weekend. Patient and his wife were initially refusing to go to a rehab facility and wanted to go home but today they agreed for the bed search. Nursing home has accepted him but they wanted a COVID-19 test result before admission which was done and negative. NH have to have Hep B surface Ag test result to have HD at a new place for the pt. test is done and reported negative.  ESRD on HD TTS- chest x-ray with mild pulmonary edema.  Patient on 2 L O2.  Wean off oxygen as tolerated. Nephrology service following.  As NH have  to send hin to a different HD unit, they are asking Hep B surface Ag test before accepting, we still do not have confirmation on his hemodialysis place at nursing home.  Fevers;  Temperature of 100.6 postoperatively. Patient has remained afebrile since 2 days.  Chest x-ray done  with similar appearance with prior studies with findings consistent with mild pulmonary edema or multifocal infection.  No shortness of breath.  Patient was placed  empirically on IV antibiotics with IV Zosyn pending blood culture results. Pneumonia was suspected but ruled out.  Patient does not have symptoms. Stop IV antibiotics.  Blood cultures are negative. His procalcitonin is higher but that could be due to renal failure issues.  We will continue monitoring off antibiotic. WBC count is normal now.  Hypertension-blood pressures was on the lower side last night -Cont Coreg, stop olmesartan, amlodipine, HCTZ  Type 2 diabetes-blood sugars controlled  -SSI  A fib   Have RVR- cardizem oral, BP soft., so held Metoprolol.   Cardio consult,  Hyperlipidemia-stable -Continue home statin  BPH-stable -Continue home Flomax  DVT prophylaxis- patient started on Lovenox already.  Also has SCD in place.     All the records are reviewed and case discussed with Care Management/Social Worker. Management plans discussed with the patient, family and they are in agreement.  CODE STATUS: Full Code  TOTAL TIME TAKING CARE OF THIS PATIENT: 35 minutes.   More than 50% of the time was spent in counseling/coordination of care: YES  POSSIBLE D/C IN 1-2 DAYS, DEPENDING ON CLINICAL CONDITION.   Vaughan Basta M.D on 05/23/2018 at 4:19 PM  Between 7am to 6pm - Pager - [phone]  After 6pm go to [email or web address] - Technical brewer [place] [facility]  Office  [phone]  CC: Primary care physician; Steele Sizer, MD  Note: This dictation was prepared with Dragon dictation along with smaller phrase technology. Any transcriptional errors that result from this process are unintentional.

## 2018-05-23 NOTE — Progress Notes (Signed)
HD Tx End  1.5 liters removed.    05/23/18 1345  Hand-Off documentation  Report given to (Full Name) Chapman Moss RN  Report received from (Full Name) Trellis Paganini RN  Vital Signs  Temp 98.4 F (36.9 C)  Temp Source Oral  Pulse Rate 61  Pulse Rate Source Monitor  Resp 18  BP 104/62  BP Location Left Arm  BP Method Automatic  Patient Position (if appropriate) Lying  Oxygen Therapy  SpO2 98 %  O2 Device Nasal Cannula  O2 Flow Rate (L/min) 2 L/min  Pain Assessment  Pain Scale 0-10  Pain Score 0  Dialysis Weight  Weight 113.3 kg  Type of Weight Post-Dialysis  During Hemodialysis Assessment  Blood Flow Rate (mL/min) 200 mL/min  Arterial Pressure (mmHg) -90 mmHg  Venous Pressure (mmHg) 60 mmHg  Transmembrane Pressure (mmHg) 30 mmHg  Ultrafiltration Rate (mL/min) 600 mL/min  Dialysate Flow Rate (mL/min) 800 ml/min  Conductivity: Machine  14  HD Safety Checks Performed Yes  Dialysis Fluid Bolus Normal Saline  Bolus Amount (mL) 250 mL  Intra-Hemodialysis Comments Tx completed

## 2018-05-23 NOTE — Progress Notes (Signed)
Post HD Tx 1.5 liters removed   05/23/18 1346  Vital Signs  Pulse Rate 91  Resp 20  BP 115/68  Oxygen Therapy  SpO2 96 %  Post-Hemodialysis Assessment  Rinseback Volume (mL) 250 mL  Dialyzer Clearance Lightly streaked  Duration of HD Treatment -hour(s) 3.5 hour(s)  Hemodialysis Intake (mL) 500 mL  UF Total -Machine (mL) 2000 mL  Net UF (mL) 1500 mL  Tolerated HD Treatment Yes  AVG/AVF Arterial Site Held (minutes) 10 minutes  AVG/AVF Venous Site Held (minutes) 10 minutes  Fistula / Graft Right Upper arm Arteriovenous fistula  Placement Date: 05/15/18   Placed prior to admission: Yes  Orientation: Right  Access Location: Upper arm  Access Type: Arteriovenous fistula  Site Condition No complications  Fistula / Graft Assessment Present;Thrill;Bruit  Status Deaccessed  Drainage Description None

## 2018-05-23 NOTE — Progress Notes (Signed)
   Subjective: 7 Days Post-Op Procedure(s) (LRB): CANNULATED HIP PINNING (Right) Patient reports no hip or lower extremity discomfort.   Patient is well, and has had no acute complaints or problems Denies any cough, CP, SOB, ABD pain. Continue therapy today.    Objective: Vital signs in last 24 hours: Temp:  [97.8 F (36.6 C)-98.1 F (36.7 C)] 98.1 F (36.7 C) (04/30 0542) Pulse Rate:  [106-124] 124 (04/30 0542) Resp:  [18-24] 24 (04/30 0542) BP: (108-131)/(57-81) 108/81 (04/30 0542) SpO2:  [92 %-97 %] 97 % (04/30 0542)  Intake/Output from previous day: No intake/output data recorded. Intake/Output this shift: No intake/output data recorded.  Recent Labs    05/23/18 0423  HGB 10.5*   Recent Labs    05/23/18 0423  WBC 7.5  RBC 3.67*  HCT 32.6*  PLT 217   Recent Labs    05/23/18 0423  CREATININE 5.89*   No results for input(s): LABPT, INR in the last 72 hours.  EXAM General - Patient is Alert, Appropriate and Oriented Extremity - Dorsiflexion/Plantar flexion intact No cellulitis present Compartment soft - homans sign Dressing - dressing C/D/I and no drainage Motor Function - intact, moving foot and toes well on exam.   Past Medical History:  Diagnosis Date  . Acute metabolic encephalopathy 35/36/1443   Per my chart  . Arthritis   . Chronic airway obstruction, not elsewhere classified    worked in [employer] fund[employer] and was exposed to [employer]  . 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 Holley Raring, nephrologist.  on dialysis tu - thur - sat  . Depressive disorder, not elsewhere classified   . Dialysis patient [facility]) 01/2018   dialysis tuesday - thursday - saturday  . Gout   . Gout   . Heart murmur    followed by dr. Clayborn Bigness  . Hyperlipidemia   . Hypertension   . Hypertrophy of prostate without urinary obstruction and other lower urinary tract symptoms (LUTS)   . Impotence of organic origin   . Microalbuminuria   . Nodular  prostate without urinary obstruction   . Obesity, unspecified   . Plantar wart   . Sleep apnea    years ago but never used a cpap  . Synovitis and tenosynovitis, unspecified   . Type II or unspecified type diabetes mellitus without mention of complication, uncontrolled   . Unspecified venous (peripheral) insufficiency   . UTI (urinary tract infection)    frequent with ESBL    Assessment/Plan:   7 Days Post-Op Procedure(s) (LRB): CANNULATED HIP PINNING (Right) Active Problems:   Closed right hip fracture (HCC)  Estimated body mass index is 29.59 kg/m as calculated from the following:   Height as of this encounter: 6\' 5"  (1.956 m).   Weight as of this encounter: 113.2 kg. Advance diet Up with therapy, WBAT RLE Pain well controlled Hgb stable 10.5  Follow up with [place] [facility] 05/30/2018 for staple removal and skin check TED hose BLE x 6 weeks  DVT Prophylaxis - TED hose and SCDs, Heparin Weight-Bearing as tolerated to right leg   T. Rachelle Hora, PA-C [place] 05/23/2018, 8:11 AM

## 2018-05-24 LAB — GLUCOSE, CAPILLARY
Glucose-Capillary: 131 mg/dL — ABNORMAL HIGH (ref 70–99)
Glucose-Capillary: 153 mg/dL — ABNORMAL HIGH (ref 70–99)
Glucose-Capillary: 191 mg/dL — ABNORMAL HIGH (ref 70–99)

## 2018-05-24 MED ORDER — DILTIAZEM HCL ER COATED BEADS 180 MG PO CP24: 180.0000 mg | ORAL_CAPSULE | Freq: Every day | ORAL | 0 refills | Status: DC

## 2018-05-24 MED ORDER — APIXABAN 5 MG PO TABS
5.0000 mg | ORAL_TABLET | Freq: Two times a day (BID) | ORAL | Status: DC
  Administered 2018-05-24: 13:00:00 5 mg via ORAL
  Filled 2018-05-24: qty 1

## 2018-05-24 MED ORDER — APIXABAN 5 MG PO TABS: 5.0000 mg | ORAL_TABLET | Freq: Two times a day (BID) | ORAL | 0 refills | Status: AC

## 2018-05-24 MED ORDER — SERTRALINE HCL 25 MG PO TABS: 25.0000 mg | ORAL_TABLET | Freq: Every day | ORAL | 2 refills | Status: DC

## 2018-05-24 NOTE — Discharge Summary (Signed)
[facility] [place] NAME: Joseph Newman    MR#:  [id number]  DATE OF BIRTH:  Jul 21, 1945  DATE OF ADMISSION:  05/15/2018 ADMITTING PHYSICIAN: Sela Hua, MD  DATE OF DISCHARGE: 05/24/2018   PRIMARY CARE PHYSICIAN: Steele Sizer, MD    ADMISSION DIAGNOSIS:  ESRD (end stage renal disease) on dialysis ([place]) [N18.6, Z99.2] Acute respiratory failure with hypoxia ([place]) [J96.01] Closed fracture of right hip, initial encounter ([place]) [S72.001A]  DISCHARGE DIAGNOSIS:  Active Problems:   Closed right hip fracture (HCC)   Pressure injury of skin     A fib  SECONDARY DIAGNOSIS:   Past Medical History:  Diagnosis Date  . Acute metabolic encephalopathy 12/87/8676   Per my chart  . Arthritis   . Chronic airway obstruction, not elsewhere classified    worked in [employer] fund[employer] and was exposed to [employer]  . 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 Holley Raring, nephrologist.  on dialysis tu - thur - sat  . Depressive disorder, not elsewhere classified   . Dialysis patient [facility]) 01/2018   dialysis tuesday - thursday - saturday  . Gout   . Gout   . Heart murmur    followed by dr. Clayborn Bigness  . Hyperlipidemia   . Hypertension   . Hypertrophy of prostate without urinary obstruction and other lower urinary tract symptoms (LUTS)   . Impotence of organic origin   . Microalbuminuria   . 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 Obesity, unspecified   . Plantar wart   . Sleep apnea    years ago but never used a cpap  . Synovitis and tenosynovitis, unspecified   . Type II or unspecified type diabetes mellitus without mention of complication, uncontrolled   . Unspecified venous (peripheral) insufficiency   . UTI (urinary tract infection)    frequent with ESBL    HOSPITAL COURSE:   Right hip fracture-s/p mechanical fall Patient already seen by orthopedic service on day 3 postop Pains appear  controlled. Physical therapist at bedside evaluating patient.  Anticipate discharge to skilled nursing facility after the weekend. Patient and his wife were initially refusing to go to a rehab facility and wanted to go home but today they agreed for the bed search. Approved at rehab- [facility] -19 per the rehab's policy for admission- negative. ( tested for SARS cov- 2 which is the test for COVID 19 virus)  ESRD on HD TTS-chest x-ray with mild pulmonary edema. Patient on 2 L O2.  Wean off oxygen as tolerated. Nephrology service following. COtn HD on Tuesday, Thursday, Saturday at A dialysis facility at [facility] ( I was informed , they don't sent pts to his own facility from this rehab place, )  Fevers;  Temperature of 100.6 postoperatively. Patient has remained afebrile since 2 days.  Chest x-ray done  with similar appearance with prior studies with findings consistent with mild pulmonary edema or multifocal infection.  No shortness of breath.  Patient was placed empirically on IV antibiotics with IV Zosyn pending blood culture results. Pneumonia was suspected but ruled out.  Patient does not have symptoms. Stop IV antibiotics.  Blood cultures are negative. His procalcitonin is higher but that could be due to renal failure issues.  We will continue monitoring off antibiotic. WBC count is normal.  Hypertension-blood pressures was on the lower side  -Decreased dose on Coreg,  Hold olmesartan, amlodipine, HCTZ - stopped coreg changed  to cardizem for A fib control. and added lasix.  A fib with rvr   Started on Cardizem for rate control, stopped coreg due to borderline BP.  Type2 diabetes-blood sugars controlled  -SSI  Hyperlipidemia-stable -Continue home statin  BPH-stable -Continue home Flomax  DVT prophylaxis- patient started on Lovenox already.  Also has SCD in place.  now on eliquis for A fib.,      DISCHARGE CONDITIONS:   Stable.  CONSULTS OBTAINED:   Treatment Team:  Hessie Knows, MD Murlean Iba, MD Teodoro Spray, MD  DRUG ALLERGIES:  No Known Allergies  DISCHARGE MEDICATIONS:   Allergies as of 05/24/2018   No Known Allergies     Medication List    STOP taking these medications   carvedilol 6.25 MG tablet Commonly known as:  COREG   Olmesartan-amLODIPine-HCTZ 40-10-25 MG Tabs     TAKE these medications   allopurinol 100 MG tablet Commonly known as:  ZYLOPRIM Take 0.5 tablets (50 mg total) by mouth every other day.   apixaban 5 MG Tabs tablet Commonly known as:  ELIQUIS Take 1 tablet (5 mg total) by mouth 2 (two) times daily.   aspirin EC 81 MG tablet Take 81 mg by mouth daily.   atorvastatin 40 MG tablet Commonly known as:  LIPITOR Take 1 tablet (40 mg total) by mouth daily.   calcitRIOL 0.25 MCG capsule Commonly known as:  ROCALTROL Take 0.25 mcg by mouth daily.   cholecalciferol 25 MCG (1000 UT) tablet Commonly known as:  VITAMIN D3 Take 2 capsules by mouth daily.   diltiazem 180 MG 24 hr capsule Commonly known as:  Cardizem CD Take 1 capsule (180 mg total) by mouth daily.   docusate sodium 100 MG capsule Commonly known as:  COLACE Take 100 mg by mouth daily.   furosemide 20 MG tablet Commonly known as:  LASIX Take 1 tablet (20 mg total) by mouth daily.   MULTIVITAMINS PO Take 1 tablet by mouth daily.   oxyCODONE 5 MG immediate release tablet Commonly known as:  Oxy IR/ROXICODONE Take 1 tablet (5 mg total) by mouth every 4 (four) hours as needed for moderate pain, severe pain or breakthrough pain.   polyethylene glycol 17 g packet Commonly known as:  MIRALAX / GLYCOLAX Take 17 g by mouth daily as needed for mild constipation.   sertraline 25 MG tablet Commonly known as:  Zoloft Take 1 tablet (25 mg total) by mouth daily.   tamsulosin 0.4 MG Caps capsule Commonly known as:  FLOMAX TAKE 1 CAPSULE (0.4 MG TOTAL) BY MOUTH DAILY AFTER BREAKFAST.   traMADol 50 MG tablet Commonly  known as:  ULTRAM Take 1 tablet (50 mg total) by mouth every 6 (six) hours as needed (mild pain).   Trulicity 1.5 YH/0.6CB Sopn Generic drug:  Dulaglutide Inject 1.5 mg into the skin See admin instructions. Injects on Sundays   Vitamin B-12 1000 MCG Subl Take 1 tablet by mouth daily.        DISCHARGE INSTRUCTIONS:    Follow with PMD in 1-2 weeks. Ortho in 2 weeks.  If you experience worsening of your admission symptoms, develop shortness of breath, life threatening emergency, suicidal or homicidal thoughts you must seek medical attention immediately by calling 911 or calling your MD immediately  if symptoms less severe.  You Must read complete instructions/literature along with all the possible adverse reactions/side effects for all the Medicines you take and that have been prescribed to you. Take any new Medicines after you  have completely understood and accept all the possible adverse reactions/side effects.   Please note  You were cared for by a hospitalist during your hospital stay. If you have any questions about your discharge medications or the care you received while you were in the hospital after you are discharged, you can call the unit and asked to speak with the hospitalist on call if the hospitalist that took care of you is not available. Once you are discharged, your primary care physician will handle any further medical issues. Please note that NO REFILLS for any discharge medications will be authorized once you are discharged, as it is imperative that you return to your primary care physician (or establish a relationship with a primary care physician if you do not have one) for your aftercare needs so that they can reassess your need for medications and monitor your lab values.    Today   CHIEF COMPLAINT:   Chief Complaint  Patient presents with  . Fall    HISTORY OF PRESENT ILLNESS:  Joseph Newman  is a 73 y.o. male with a known history of ESRD on HD,  hypertension, hyperlipidemia, type 2 diabetes, depression who presented to the ED with right hip pain after a mechanical fall at home.  He states he was trying to sit down on a chair, when he completely missed the chair and landed on his bottom and right hip.  He denies any head trauma or loss of consciousness.  He states he does not have any hip pain when he lays completely still, but he has pretty significant pain whenever he tries to move his legs.  He denies any numbness or tingling of his lower extremities.  He denies any chest pain or shortness of breath.  In the ED, he had desaturations to 88% on room air and was placed on 2 L O2.  Labs are significant for K 5.2.  Chest x-ray with pulmonary venous congestion and mild edema.  Right hip x-ray with acute nondisplaced right subcapital femoral neck fracture.  Hospitalists were called for admission.   VITAL SIGNS:  Blood pressure (!) 120/56, pulse 82, temperature 98.2 F (36.8 C), temperature source Oral, resp. rate 19, height 6\' 5"  (1.956 m), weight 109.8 kg, SpO2 100 %.  I/O:    Intake/Output Summary (Last 24 hours) at 05/24/2018 1258 Last data filed at 05/23/2018 1945 Gross per 24 hour  Intake -  Output 1700 ml  Net -1700 ml    PHYSICAL EXAMINATION:   Constitutional: He is oriented to person, place, and time. He appears well-developed and well-nourished.  HENT:  Head: Normocephalic and atraumatic.  Right Ear: External ear normal.  Eyes: Pupils are equal, round, and reactive to light. Conjunctivae are normal. Right eye exhibits no discharge.  Neck: Normal range of motion. No thyromegaly present.  Cardiovascular: Normal rate, regular rhythm and normal heart sounds.  Respiratory: Effort normal. No respiratory distress. He has no wheezes. He has rales.  GI: Soft. Bowel sounds are normal. He exhibits no distension.  Musculoskeletal: Normal range of motion.        General: No edema.  Neurological: He is alert and oriented to person,  place, and time.  Generalized weakness  Skin: Skin is warm. He is not diaphoretic. No erythema.   DATA REVIEW:   CBC Recent Labs  Lab 05/23/18 0423  WBC 7.5  HGB 10.5*  HCT 32.6*  PLT 217    Chemistries  Recent Labs  Lab 05/18/18 0449 05/20/18 0436 05/23/18  0423  NA 138 136  --   K 4.6 4.9  --   CL 100 100  --   CO2 25 25  --   GLUCOSE 141* 139*  --   BUN 54* 52*  --   CREATININE 6.18* 6.41* 5.89*  CALCIUM 8.2* 8.8*  --   MG 2.0  --   --     Cardiac Enzymes No results for input(s): TROPONINI in the last 168 hours.  Microbiology Results  Results for orders placed or performed during the hospital encounter of 05/15/18  Surgical PCR screen     Status: Abnormal   Collection Time: 05/15/18 10:05 PM  Result Value Ref Range Status   MRSA, PCR POSITIVE (A) NEGATIVE Final    Comment: RESULT CALLED TO, READ BACK BY AND VERIFIED WITH: Kathyrn Drown RN [id number] 05/15/2018 HNM    Staphylococcus aureus POSITIVE (A) NEGATIVE Final    Comment: (NOTE) The Xpert SA Assay (FDA approved for NASAL specimens in patients 2 years of age and older), is one component of a comprehensive surveillance program. It is not intended to diagnose infection nor to guide or monitor treatment. Performed at [facility], [address] [place], [place] [address]   CULTURE, BLOOD (ROUTINE X 2) w Reflex to ID Panel     Status: None   Collection Time: 05/16/18 10:22 AM  Result Value Ref Range Status   Specimen Description BLOOD LEFT ARM  Final   Special Requests   Final    BOTTLES DRAWN AEROBIC AND ANAEROBIC Blood Culture adequate volume   Culture   Final    NO GROWTH 5 DAYS Performed at [facility], [address] [place], [place] [address]    Report Status 05/21/2018 FINAL  Final  CULTURE, BLOOD (ROUTINE X 2) w Reflex to ID Panel     Status: None   Collection Time: 05/16/18 10:29 AM  Result Value Ref Range Status   Specimen Description BLOOD LEFT ANTECUBITAL  Final    Special Requests   Final    BOTTLES DRAWN AEROBIC AND ANAEROBIC Blood Culture adequate volume   Culture   Final    NO GROWTH 5 DAYS Performed at [facility], [address] [place], [place] [address]    Report Status 05/21/2018 FINAL  Final  SARS Coronavirus 2 [facility] order, Performed in [place] hospital lab)     Status: None   Collection Time: 05/21/18 11:22 AM  Result Value Ref Range Status   SARS Coronavirus 2 NEGATIVE NEGATIVE Final    Comment: (NOTE) If result is NEGATIVE SARS-CoV-2 target nucleic acids are NOT DETECTED. The SARS-CoV-2 RNA is generally detectable in upper and lower  respiratory specimens during the acute phase of infection. The lowest  concentration of SARS-CoV-2 viral copies this assay can detect is 250  copies / mL. A negative result does not preclude SARS-CoV-2 infection  and should not be used as the sole basis for treatment or other  patient management decisions.  A negative result may occur with  improper specimen collection / handling, submission of specimen other  than nasopharyngeal swab, presence of viral mutation(s) within the  areas targeted by this assay, and inadequate number of viral copies  (<250 copies / mL). A negative result must be combined with clinical  observations, patient history, and epidemiological information. If result is POSITIVE SARS-CoV-2 target nucleic acids are DETECTED. The SARS-CoV-2 RNA is generally detectable in upper and lower  respiratory specimens dur ing the acute phase  of infection.  Positive  results are indicative of active infection with SARS-CoV-2.  Clinical  correlation with patient history and other diagnostic information is  necessary to determine patient infection status.  Positive results do  not rule out bacterial infection or co-infection with other viruses. If result is PRESUMPTIVE POSTIVE SARS-CoV-2 nucleic acids MAY BE PRESENT.   A presumptive positive result was obtained on the  submitted specimen  and confirmed on repeat testing.  While 2019 novel coronavirus  (SARS-CoV-2) nucleic acids may be present in the submitted sample  additional confirmatory testing may be necessary for epidemiological  and / or clinical management purposes  to differentiate between  SARS-CoV-2 and other Sarbecovirus currently known to infect humans.  If clinically indicated additional testing with an alternate test  methodology [phone]) is advised. The SARS-CoV-2 RNA is generally  detectable in upper and lower respiratory sp ecimens during the acute  phase of infection. The expected result is Negative. Fact Sheet for Patients:  StrictlyIdeas.no Fact Sheet for Healthcare Providers: BankingDealers.co.za This test is not yet approved or cleared by the [place] FDA and has been authorized for detection and/or diagnosis of SARS-CoV-2 by FDA under an Emergency Use Authorization (EUA).  This EUA will remain in effect (meaning this test can be used) for the duration of the COVID-19 declaration under Section 564(b)(1) of the Act, 21 U.S.C. section 360bbb-3(b)(1), unless the authorization is terminated or revoked sooner. Performed at [facility], [address] [place], [place] [address]     RADIOLOGY:  No results found.  EKG:   Orders placed or performed during the hospital encounter of 05/15/18  . EKG 12-Lead  . EKG 12-Lead      Management plans discussed with the patient, family and they are in agreement.  CODE STATUS:     Code Status Orders  (From admission, onward)         Start     Ordered   05/15/18 2116  Full code  Continuous     05/15/18 2116        Code Status History    Date Active Date Inactive Code Status Order ID Comments User Context   11/01/2017 1835 11/07/2017 2158 DNR [id number]  Demetrios Loll, MD Inpatient   09/25/2017 1616 09/29/2017 2043 Full Code [id number]  Epifanio Lesches, MD ED       TOTAL TIME TAKING CARE OF THIS PATIENT: 35 minutes.    Vaughan Basta M.D on 05/24/2018 at 12:58 PM  Between 7am to 6pm - Pager - [phone]  After 6pm go to [email or web address] - password [email or web address] [facility]  Office  [phone]  CC: Primary care physician; Steele Sizer, MD   Note: This dictation was prepared with Dragon dictation along with smaller phrase technology. Any transcriptional errors that result from this process are unintentional.

## 2018-05-24 NOTE — TOC Transition Note (Signed)
Transition of Care [facility]) - CM/SW Discharge Note   Patient Details  Name: Joseph Newman MRN: [id number] Date of Birth: [birth year]  [facility] of [facility] CM/SW Contact:  Latanya Maudlin, RN Phone Number: 05/24/2018, 2:54 PM   Clinical Narrative:  Patient to discharge to [facility] skilled nursing facility. Spoke with Linus Orn from [facility] about discharge planning. Electronically sent CSW report as well as discharge summary to through the hub. Notified Tracey of HD schedule for Monday, Wednesday and Friday at [facility]. Next treatment scheduled for Monday 5/4. EMS for transport. EMS packet placed in chart with hard scripts in packet. Primary RN aware and is set to call report to accepting RN.     Final next level of care: Skilled Nursing Facility Barriers to Discharge: No Barriers Identified   Patient Goals and CMS Choice Patient states their goals for this hospitalization and ongoing recovery are:: Wife states that she wants patient to come home  CMS Medicare.gov Compare Post Acute Care list provided to:: Patient Represenative (must comment)(Wife) Choice offered to / list presented to : Spouse  Discharge Placement              Patient chooses bed at: [facility] Patient to be transferred to facility by: [facility]      Discharge Plan and Services     Post Acute Care Choice: [place]: [facility] Date [place]: 05/17/18 Time [place]: 3329 Representative spoke with at [place]: [place] ([place]) Interventions     Readmission Risk Interventions Readmission Risk Prevention Plan 05/24/2018 05/17/2018  Transportation Screening Complete Complete  PCP or Specialist Appt within 3-5 Days Complete -  [place] or Home Care Consult Complete -  Social Work Consult for [place] Planning/Counseling Complete -  Palliative Care Screening Not Applicable -  Medication Review Designer, fashion/clothing) Complete Complete  HRI or [place] - Complete  [place] - Patient Refused  Some recent data might be hidden

## 2018-05-24 NOTE — Progress Notes (Signed)
Subjective: 8 Days Post-Op Procedure(s) (LRB): CANNULATED HIP PINNING (Right) Patient reports no hip or lower extremity discomfort.   Patient is well, and has had no acute complaints or problems Denies any cough, CP, SOB, ABD pain. Continue therapy today.   Objective: Vital signs in last 24 hours: Temp:  [97.7 F (36.5 C)-98.4 F (36.9 C)] 98.2 F (36.8 C) (05/01 0511) Pulse Rate:  [61-129] 82 (05/01 0511) Resp:  [18-22] 19 (05/01 0511) BP: (96-124)/(56-81) 120/56 (05/01 0511) SpO2:  [94 %-100 %] 100 % (05/01 0511) Weight:  [109.8 kg-114.8 kg] 109.8 kg (05/01 0523)  Intake/Output from previous day: 04/30 0701 - 05/01 0700 In: -  Out: 1700 [Urine:200] Intake/Output this shift: No intake/output data recorded.  Recent Labs    05/23/18 0423  HGB 10.5*   Recent Labs    05/23/18 0423  WBC 7.5  RBC 3.67*  HCT 32.6*  PLT 217   Recent Labs    05/23/18 0423  CREATININE 5.89*   No results for input(s): LABPT, INR in the last 72 hours.  EXAM General - Patient is Alert, Appropriate and Oriented Extremity - Dorsiflexion/Plantar flexion intact No cellulitis present Compartment soft - homans sign Dressing - dressing C/D/I and no drainage Motor Function - intact, moving foot and toes well on exam.   Past Medical History:  Diagnosis Date  . Acute metabolic encephalopathy 95/63/8756   Per my chart  . Arthritis   . Chronic airway obstruction, not elsewhere classified    worked in [employer] and was exposed to [employer]  . 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 Holley Raring, nephrologist.  on dialysis tu - thur - sat  . Depressive disorder, not elsewhere classified   . Dialysis patient [facility]) 01/2018   dialysis tuesday - thursday - saturday  . Gout   . Gout   . Heart murmur    followed by dr. Clayborn Bigness  . Hyperlipidemia   . Hypertension   . Hypertrophy of prostate without urinary obstruction and other lower urinary tract symptoms (LUTS)    . Impotence of organic origin   . Microalbuminuria   . 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 Obesity, unspecified   . Plantar wart   . Sleep apnea    years ago but never used a cpap  . Synovitis and tenosynovitis, unspecified   . Type II or unspecified type diabetes mellitus without mention of complication, uncontrolled   . Unspecified venous (peripheral) insufficiency   . UTI (urinary tract infection)    frequent with ESBL    Assessment/Plan:   8 Days Post-Op Procedure(s) (LRB): CANNULATED HIP PINNING (Right) Active Problems:   Closed right hip fracture (HCC)   Pressure injury of skin  Estimated body mass index is 28.7 kg/m as calculated from the following:   Height as of this encounter: 6\' 5"  (1.956 m).   Weight as of this encounter: 109.8 kg. Advance diet Up with therapy, WBAT RLE Pain well controlled  Staples can be removed on 05/30/18. If patient still admitted at this time will return to remove staples. TED hose BLE x 6 weeks  Orthopaedics will sign off at this time. Please message if any concerns arise.  DVT Prophylaxis - TED hose and SCDs, Heparin Weight-Bearing as tolerated to right leg  J. Cameron Proud, PA-C [place] 05/24/2018, 8:28 AM

## 2018-05-24 NOTE — Progress Notes (Signed)
Patient accepted at [facility], Chair time MWF 12:20, can start Monday 5/4.

## 2018-05-24 NOTE — Progress Notes (Signed)
[place], [place] 05/24/18  Subjective:  Patient completed dialysis yesterday. Tolerating dialysis treatments well in the hospital. Currently sitting up in chair.    Objective:  Vital signs in last 24 hours:  Temp:  [97.7 F (36.5 C)-98.4 F (36.9 C)] 98.2 F (36.8 C) (05/01 0511) Pulse Rate:  [61-129] 82 (05/01 0511) Resp:  [18-20] 19 (05/01 0511) BP: (96-124)/(56-81) 120/56 (05/01 0511) SpO2:  [94 %-100 %] 100 % (05/01 0511) Weight:  [109.8 kg-113.3 kg] 109.8 kg (05/01 0523)  Weight change:  Filed Weights   05/23/18 1000 05/23/18 1345 05/24/18 0523  Weight: 114.8 kg 113.3 kg 109.8 kg    Intake/Output:    Intake/Output Summary (Last 24 hours) at 05/24/2018 1116 Last data filed at 05/23/2018 1945 Gross per 24 hour  Intake -  Output 1700 ml  Net -1700 ml     Physical Exam: General:  Chronically ill-appearing, sitting up in chair  HEENT  anicteric, moist oral mucous membranes  Neck  supple  Pulm/lungs  normal breathing effort, mild crackles at bases b/l  CVS/Heart  irregular, no rub  Abdomen:   Soft, nontender  Extremities:  Trace peripheral edema  Neurologic:  awake, alert, follows commands  Skin:  No acute rashes  Access:  Right arm AV fistula, good bruit       Basic Metabolic Panel:  Recent Labs  Lab 05/18/18 0449 05/20/18 0436 05/23/18 0423 05/23/18 1049  NA 138 136  --   --   K 4.6 4.9  --   --   CL 100 100  --   --   CO2 25 25  --   --   GLUCOSE 141* 139*  --   --   BUN 54* 52*  --   --   CREATININE 6.18* 6.41* 5.89*  --   CALCIUM 8.2* 8.8*  --   --   MG 2.0  --   --   --   PHOS 6.3*  --   --  5.3*     CBC: Recent Labs  Lab 05/18/18 0449 05/19/18 0534 05/20/18 0436 05/23/18 0423  WBC 12.1* 8.8 7.6 7.5  HGB 8.6* 9.9* 10.4* 10.5*  HCT 27.0* 30.9* 32.0* 32.6*  MCV 88.2 88.5 87.9 88.8  PLT 190 167 188 217      Lab Results  Component Value Date   HEPBSAG Negative 05/22/2018   HEPBSAG Negative  05/22/2018   HEPBIGM Negative 05/22/2018      Microbiology:  Recent Results (from the past 240 hour(s))  Surgical PCR screen     Status: Abnormal   Collection Time: 05/15/18 10:05 PM  Result Value Ref Range Status   MRSA, PCR POSITIVE (A) NEGATIVE Final    Comment: RESULT CALLED TO, READ BACK BY AND VERIFIED WITH: Kathyrn Drown RN [phone] 05/15/2018 HNM    Staphylococcus aureus POSITIVE (A) NEGATIVE Final    Comment: (NOTE) The Xpert SA Assay (FDA approved for NASAL specimens in patients 32 years of age and older), is one component of a comprehensive surveillance program. It is not intended to diagnose infection nor to guide or monitor treatment. Performed at [facility], [place], [place] [address]   CULTURE, BLOOD (ROUTINE X 2) w Reflex to ID Panel     Status: None   Collection Time: 05/16/18 10:22 AM  Result Value Ref Range Status   Specimen Description BLOOD LEFT ARM  Final   Special Requests   Final    BOTTLES DRAWN  AEROBIC AND ANAEROBIC Blood Culture adequate volume   Culture   Final    NO GROWTH 5 DAYS Performed at [facility], [address] [place], [place] [address]    Report Status 05/21/2018 FINAL  Final  CULTURE, BLOOD (ROUTINE X 2) w Reflex to ID Panel     Status: None   Collection Time: 05/16/18 10:29 AM  Result Value Ref Range Status   Specimen Description BLOOD LEFT ANTECUBITAL  Final   Special Requests   Final    BOTTLES DRAWN AEROBIC AND ANAEROBIC Blood Culture adequate volume   Culture   Final    NO GROWTH 5 DAYS Performed at [facility], [address] [place], [place] [address]    Report Status 05/21/2018 FINAL  Final  SARS Coronavirus 2 [facility] order, Performed in [place] hospital lab)     Status: None   Collection Time: 05/21/18 11:22 AM  Result Value Ref Range Status   SARS Coronavirus 2 NEGATIVE NEGATIVE Final    Comment: (NOTE) If result is NEGATIVE SARS-CoV-2 target nucleic acids are  NOT DETECTED. The SARS-CoV-2 RNA is generally detectable in upper and lower  respiratory specimens during the acute phase of infection. The lowest  concentration of SARS-CoV-2 viral copies this assay can detect is 250  copies / mL. A negative result does not preclude SARS-CoV-2 infection  and should not be used as the sole basis for treatment or other  patient management decisions.  A negative result may occur with  improper specimen collection / handling, submission of specimen other  than nasopharyngeal swab, presence of viral mutation(s) within the  areas targeted by this assay, and inadequate number of viral copies  (<250 copies / mL). A negative result must be combined with clinical  observations, patient history, and epidemiological information. If result is POSITIVE SARS-CoV-2 target nucleic acids are DETECTED. The SARS-CoV-2 RNA is generally detectable in upper and lower  respiratory specimens dur ing the acute phase of infection.  Positive  results are indicative of active infection with SARS-CoV-2.  Clinical  correlation with patient history and other diagnostic information is  necessary to determine patient infection status.  Positive results do  not rule out bacterial infection or co-infection with other viruses. If result is PRESUMPTIVE POSTIVE SARS-CoV-2 nucleic acids MAY BE PRESENT.   A presumptive positive result was obtained on the submitted specimen  and confirmed on repeat testing.  While 2019 novel coronavirus  (SARS-CoV-2) nucleic acids may be present in the submitted sample  additional confirmatory testing may be necessary for epidemiological  and / or clinical management purposes  to differentiate between  SARS-CoV-2 and other Sarbecovirus currently known to infect humans.  If clinically indicated additional testing with an alternate test  methodology [phone]) is advised. The SARS-CoV-2 RNA is generally  detectable in upper and lower respiratory sp ecimens  during the acute  phase of infection. The expected result is Negative. Fact Sheet for Patients:  StrictlyIdeas.no Fact Sheet for Healthcare Providers: BankingDealers.co.za This test is not yet approved or cleared by the [place] FDA and has been authorized for detection and/or diagnosis of SARS-CoV-2 by FDA under an Emergency Use Authorization (EUA).  This EUA will remain in effect (meaning this test can be used) for the duration of the COVID-19 declaration under Section 564(b)(1) of the Act, 21 U.S.C. section 360bbb-3(b)(1), unless the authorization is terminated or revoked sooner. Performed at [facility], [address] [place], [place] [address]  Coagulation Studies: No results for input(s): LABPROT, INR in the last 72 hours.  Urinalysis: No results for input(s): COLORURINE, LABSPEC, PHURINE, GLUCOSEU, HGBUR, BILIRUBINUR, KETONESUR, PROTEINUR, UROBILINOGEN, NITRITE, LEUKOCYTESUR in the last 72 hours.  Invalid input(s): APPERANCEUR    Imaging: No results found.   Medications:    . allopurinol  50 mg Oral QODAY  . aspirin EC  81 mg Oral Daily  . atorvastatin  40 mg Oral Daily  . calcium acetate  1,334 mg Oral TID WC  . Chlorhexidine Gluconate Cloth  6 each Topical Q0600  . diltiazem  60 mg Oral Q8H  . docusate sodium  100 mg Oral BID  . epoetin (EPOGEN/PROCRIT) injection  4,000 Units Intravenous Q T,Th,Sa-HD  . heparin injection (subcutaneous)  5,000 Units Subcutaneous Q8H  . insulin aspart  0-5 Units Subcutaneous QHS  . insulin aspart  0-9 Units Subcutaneous TID WC  . tamsulosin  0.4 mg Oral QPC breakfast   acetaminophen **OR** acetaminophen, alum & mag hydroxide-simeth, bisacodyl, magnesium citrate, magnesium hydroxide, menthol-cetylpyridinium **OR** phenol, morphine injection, ondansetron **OR** ondansetron (ZOFRAN) IV, oxyCODONE, polyethylene glycol, traMADol  Assessment/ Plan:  73 y.o.  African-American male with end-stage renal disease, depression, gout, BPH, history of urinary retention, followed by urologist Dr. Bernardo Heater, history of colonization of urine  Davita Graham/TTS/103.5 kg/Rt arm AVF/  1.  End-stage renal disease -Patient due for dialysis again tomorrow.  We will prepare orders.  2.  Anemia of chronic kidney disease -Continue Epogen 4000 units IV with dialysis.  Hemoglobin was 10.5 at last check.  3.  Secondary hyperparathyroidism -Phosphorus 5.3 and at target.  4.  Right hip fracture Underwent right hip pinning on 4/23  5. Hypoxia Patient now off of furosemide.   LOS: 9 Lanyla Costello 5/1/202011:16 AM  [place], [place]  Note: This note was prepared with Dragon dictation. Any transcription errors are unintentional

## 2018-05-24 NOTE — Progress Notes (Signed)
Physical Therapy Treatment Patient Details Name: Joseph Newman MRN: [id number] DOB: 1945-06-30 Today's Date: 05/24/2018    History of Present Illness Pt is 73 yo male s/p cannulated hip pinning after a fall at home. PMH of COPD, HTN, renal disease, HLD, DM    PT Comments    Patient tolerated treatment fair and is making progress towards goals at this point. He was very reluctant to participate and required max encouragement to work on functional mobility. His mood seems to be low and he seems to be willing to participate less than his current capacity. He did agree to participate with max encouragement. Patient completed supine to sit with mod A +2 for equipment management and to advance feet off edge of bed and support for trunk. Completed STS x4 with mod A +2 to RW: to/from elevated EOB, elevated bed to chair, chair/chair x 2. Patient required manual assistance to place feet properly, intermittant manual assistance to place hands on RW and chair arms. Required heavy cuing for hand placement, body position, AD position. Patient required heavy cuing to stand up straight and perform safe sequencing during task. Patient ambulated x2 trials a few steps 2-3 feet each time with RW and mod A +2 with close chair follow, step by step cuing to advance feet. Patient willing to ambulate a few steps each time before declining to continue due to fatigue. O2 sat remained in high 90s with 2L/min O2 and HR consistently rose to 126 bpm with effort. Patient consistently stating he cannot walk or go further. Physician entered room briefly during session, and patient received a phone call from oldest son, Joseph Newman (cell [phone]). Patient gave verbal permission for PT to speak with Joseph Newman. Answered questions and concerns to the best of PT's ability, provided nursing station phone number, and relayed concerns about poor communication with patient's wife to nursing. Joseph Newman appreciative of the assistance. Patient would benefit from  continued physical therapy to address remaining impairments and functional limitations to work towards stated goals and return to PLOF or maximal functional independence.     Follow Up Recommendations  SNF     Equipment Recommendations  Rolling walker with 5" wheels    Recommendations for Other Services OT consult     Precautions / Restrictions Precautions Precautions: Fall Restrictions Weight Bearing Restrictions: Yes RLE Weight Bearing: Weight bearing as tolerated    Mobility  Bed Mobility Overal bed mobility: Needs Assistance Bed Mobility: Supine to Sit     Supine to sit: Mod assist;+2 for safety/equipment;HOB elevated     General bed mobility comments: Patient able to contribute some to advance legs of edge of bed. Required trunk support   Transfers Overall transfer level: Needs assistance Equipment used: Rolling walker (2 wheeled) Transfers: Sit to/from Stand Sit to Stand: +2 safety/equipment;+2 physical assistance;Mod assist;Min assist;From elevated surface         General transfer comment: Completed STS x4 with mod A +2 to RW. to/from elevated EOB, elevated bed to chair, chair/chair x 2. Patient required manual assistance to place feet properly, intermittant manual assistance to place hands on RW and chair arms. Required heavy cuing for hand placement, body position, AD position.  Patient required heavy cuing to stand up stright.  Ambulation/Gait Ambulation/Gait assistance: Min assist;Mod assist;+2 safety/equipment Gait Distance (Feet): 3 Feet Assistive device: Rolling walker (2 wheeled) Gait Pattern/deviations: WFL(Within Functional Limits);Step-to pattern;Decreased step length - right;Decreased step length - left;Scissoring;Shuffle Gait velocity: very slow.    General Gait Details: Patient ambulated x2 trials  a few steps 2-3 feet each time with RW and mod A +2 with close chair follow, step by step cuing to advance feet. Patient willing to ambulate a few steps  each time before declining to continue due to fatigue. O2 sat remained in high 90s with 2L/min O2 and HR consistently rose to 126 bpm with effort.  Patient consistently stating he cannot walk or go further.    Stairs             Wheelchair Mobility    Modified Rankin (Stroke Patients Only)       Balance Overall balance assessment: Needs assistance Sitting-balance support: Feet supported;Single extremity supported Sitting balance-Leahy Scale: Fair Sitting balance - Comments: Patient able to sit at EOB and edge of chair with unilateral UE support steadily.      Standing balance-Leahy Scale: Poor Standing balance comment: patient required assistance to acheive full upright standing. Dependent on BUE support on RW                            Cognition Arousal/Alertness: Awake/alert Behavior During Therapy: Agitated Overall Cognitive Status: No family/caregiver present to determine baseline cognitive functioning                                 General Comments: Patient appears depressed and frustrated with care; does not want to participate without max encouragement.       Exercises Other Exercises Other Exercises: Practiced hand placment, body position, AD positioning, and stepping strategies duirng functional mobility.     General Comments        Pertinent Vitals/Pain Faces Pain Scale: Hurts even more Pain Location: with movement of R hip, hip flexion when descending to sit.  Pain Descriptors / Indicators: Grimacing;Guarding Pain Intervention(s): Monitored during session;Repositioned    Home Living                      Prior Function            PT Goals (current goals can now be found in the care plan section) Acute Rehab PT Goals Patient Stated Goal: to decrease pain PT Goal Formulation: With patient Time For Goal Achievement: 05/31/18 Potential to Achieve Goals: Fair Progress towards PT goals: Progressing toward goals     Frequency    BID      PT Plan Current plan remains appropriate    Co-evaluation              AM-PAC PT "6 Clicks" Mobility   Outcome Measure  Help needed turning from your back to your side while in a flat bed without using bedrails?: A Little Help needed moving from lying on your back to sitting on the side of a flat bed without using bedrails?: A Little Help needed moving to and from a bed to a chair (including a wheelchair)?: A Lot Help needed standing up from a chair using your arms (e.g., wheelchair or bedside chair)?: A Lot Help needed to walk in hospital room?: A Lot Help needed climbing 3-5 steps with a railing? : Total 6 Click Score: 13    End of Session Equipment Utilized During Treatment: Oxygen;Gait belt(oxygen initially off upon PT entering room and O2 sat at 92%, dropped to 86% with functional mobility so re-applied at 2L/min for remainder of session with O2 satting in high 90s. ) Activity Tolerance: Patient  limited by pain;Patient limited by fatigue Patient left: with call bell/phone within reach;in chair;with chair alarm set;with nursing/sitter in room Nurse Communication: Mobility status(oxygen put back on, vitals and performance, cath off) PT Visit Diagnosis: Other abnormalities of gait and mobility (R26.89);Muscle weakness (generalized) (M62.81);Pain;Difficulty in walking, not elsewhere classified (R26.2) Pain - Right/Left: Right Pain - part of body: Hip     Time: 1000-1050 PT Time Calculation (min) (ACUTE ONLY): 50 min  Charges:  $Gait Training: 8-22 mins $Therapeutic Activity: 23-37 mins                     Everlean Alstrom. Graylon Good, PT, DPT 05/24/18, 11:33 AM

## 2018-05-24 NOTE — Progress Notes (Signed)
Physical Therapy Treatment Patient Details Name: Joseph Newman MRN: [id number] DOB: Jan 03, 1946 Today's Date: 05/24/2018    History of Present Illness Pt is 73 yo male s/p cannulated hip pinning after a fall at home. PMH of COPD, HTN, renal disease, HLD, DM    PT Comments    Patient tolerated treatment well and is making gradual progress towards goals. He fatigued quickly and remains somewhat ambivilant to PT. He agreed to participate but required max encouragement to complete mobility. Became more motivated and required less assistance with successive attempts at weight bearing activities. Patient required mod-max A +2 for STS transfer to RW from chair with pillows in it and chair to bed. He required cuing for safe backing up to chair, hand placement, RW placement. He ambulated x6 then x8 feet with RW and mod-min A +2 with improved gait pattern, no longer scissoring, longer step length. Continues to express feeling he cannot continue but taking bigger steps with better balance with repeated attempts. Required total A +2 for sit to supine and side scooting in bed. Patient fatigued at end of session. His O2 sat dropped to 85-88% on 2L/min O2 and HR increased up to 126 bpm during effort. HR returned to 90s with rest and O2 sat back up to mid to low 90s with rest. Patient would benefit from continued physical therapy to address remaining impairments and functional limitations to work towards stated goals and return to PLOF or maximal functional independence.    Follow Up Recommendations  SNF     Equipment Recommendations  Rolling walker with 5" wheels    Recommendations for Other Services OT consult     Precautions / Restrictions Precautions Precautions: Fall Restrictions Weight Bearing Restrictions: Yes RLE Weight Bearing: Weight bearing as tolerated    Mobility  Bed Mobility Overal bed mobility: Needs Assistance Bed Mobility: Supine to Sit     Supine to sit: Mod assist;+2 for  safety/equipment;HOB elevated Sit to supine: +2 for physical assistance;Total assist   General bed mobility comments: Patient unable to get farther than leaning towards R elbow with cuing. Required assistance at legs and trunk.   Transfers Overall transfer level: Needs assistance Equipment used: Rolling walker (2 wheeled) Transfers: Sit to/from Stand Sit to Stand: +2 physical assistance;Mod assist;From elevated surface;Max assist         General transfer comment: Completed STS x3 with mod-max A +2 to RW. to/from chair with pillows x2; chair to elevated bed. Patient required manual assistance to place feet properly, intermittant manual assistance to place hands on RW and chair arms. Required heavy cuing for hand placement, body position, AD position.  Patient required heavy cuing to stand up stright. Improves with each attempt. O2 sat dropping to 85-88% folowing each attempt HR up to 126 bpm. Returns to normal with seated rest.   Ambulation/Gait Ambulation/Gait assistance: Mod assist;+2 safety/equipment;Min assist Gait Distance (Feet): 8 Feet Assistive device: Rolling walker (2 wheeled) Gait Pattern/deviations: Decreased step length - right;Decreased step length - left;Shuffle;Step-through pattern Gait velocity: very slow.    General Gait Details: Patient ambulated x2 trials 6 feet and 8 feet with RW and mod A +2 with close chair follow, step by step cuing to advance feet. Improved step length. Patient with strong left posterior lean at start of first ambulation that improved second set. More confident and required less encouragement second set. O2 dropped to 85-88% on 2L/min O2 and HR climbed to 126bpm each set but returned to WNL with seated rest. Also practiced steps  and RW navigation for turning from chair to bed with min A +2.    Stairs             Wheelchair Mobility    Modified Rankin (Stroke Patients Only)       Balance Overall balance assessment: Needs  assistance Sitting-balance support: Feet supported;Single extremity supported Sitting balance-Leahy Scale: Fair Sitting balance - Comments: Patient able to sit at EOB and edge of chair with unilateral UE support steadily.      Standing balance-Leahy Scale: Poor Standing balance comment: patient required assistance to acheive full upright standing progressing to intermittant standing with min A. Dependent on BUE support on RW                            Cognition Arousal/Alertness: Awake/alert Behavior During Therapy: Flat affect Overall Cognitive Status: No family/caregiver present to determine baseline cognitive functioning                                 General Comments: Patient states he will do anything PT wants but that he doesn't want to get up. Agrees to participate with strong encouragement and prolonged rest periods.       Exercises Other Exercises Other Exercises: Practiced hand placment, body position, AD positioning, and stepping strategies duirng functional mobility.     General Comments        Pertinent Vitals/Pain Pain Assessment: Faces Faces Pain Scale: Hurts even more Pain Location: with movement of R hip, hip flexion  Pain Descriptors / Indicators: Grimacing;Guarding Pain Intervention(s): Monitored during session;Repositioned    Home Living                      Prior Function            PT Goals (current goals can now be found in the care plan section) Acute Rehab PT Goals Patient Stated Goal: to decrease pain PT Goal Formulation: With patient Time For Goal Achievement: 05/31/18 Potential to Achieve Goals: Fair Progress towards PT goals: Progressing toward goals    Frequency    BID      PT Plan Current plan remains appropriate    Co-evaluation              AM-PAC PT "6 Clicks" Mobility   Outcome Measure  Help needed turning from your back to your side while in a flat bed without using bedrails?: A  Lot Help needed moving from lying on your back to sitting on the side of a flat bed without using bedrails?: A Lot Help needed moving to and from a bed to a chair (including a wheelchair)?: A Lot Help needed standing up from a chair using your arms (e.g., wheelchair or bedside chair)?: A Lot Help needed to walk in hospital room?: A Lot Help needed climbing 3-5 steps with a railing? : Total 6 Click Score: 11    End of Session Equipment Utilized During Treatment: Oxygen;Gait belt(2L/min) Activity Tolerance: Patient limited by pain;Patient limited by fatigue;Patient tolerated treatment well Patient left: with call bell/phone within reach;with bed alarm set;in bed Nurse Communication: Mobility status(results of session; request for coffee) PT Visit Diagnosis: Other abnormalities of gait and mobility (R26.89);Muscle weakness (generalized) (M62.81);Pain;Difficulty in walking, not elsewhere classified (R26.2) Pain - Right/Left: Right Pain - part of body: Hip     Time: 1346-1420 PT Time Calculation (min) (ACUTE  ONLY): 34 min  Charges:  $Gait Training: 8-22 mins $Therapeutic Activity: 8-22 mins                     Everlean Alstrom. Graylon Good, PT, DPT 05/24/18, 2:46 PM

## 2018-05-24 NOTE — Consult Note (Signed)
Cardiology Consultation Note    Patient ID: Joseph Newman, MRN: [id number], DOB/AGE: May 24, 1945 73 y.o. Admit date: 05/15/2018   Date of Consult: 05/24/2018 Primary Physician: Steele Sizer, MD Primary Cardiologist:  Dr. Clayborn Bigness  Chief Complaint: afib Reason for Consultation: afib Requesting MD: Dr. Anselm Jungling  HPI: Joseph Newman is a 73 y.o. male with history of end-stage renal disease on hemodialysis, history of hypertension, hyperlipidemia and a systolic murmur who was admitted April 22 of 2020 with complaints of A mechanical fall with resultant hip pain.  Right hip x-ray revealed an acute nondisplaced right subcapital femoral neck fracture.  He underwent hemiarthroplasty on April 23.  Postoperatively he has been receiving hemodialysis.  He has developed atrial fibrillation/atrial flutter with fairly rapid ventricular response.  It is unclear but does not appear documented that he has had this before.  He is a somewhat difficult historian.  This morning he appears to be in atrial fibrillation with a rate in the high 1 teens low 120s.  He initially had refused to take his p.o. Cardizem.  He is hemodynamically stable and denies chest pain or significant leg pain as long as he lays still.  He is on subcu heparin.  He also is on atorvastatin at 40 mg daily.  He currently is prescribed diltiazem at 60 mg every 8 hours which was started yesterday afternoon. Past Medical History:  Diagnosis Date  . Acute metabolic encephalopathy 91/63/8466   Per my chart  . Arthritis   . Chronic airway obstruction, not elsewhere classified    worked in [employer] and was exposed to [employer]  . 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 Holley Raring, nephrologist.  on dialysis tu - thur - sat  . Depressive disorder, not elsewhere classified   . Dialysis patient [facility]) 01/2018   dialysis tuesday - thursday - saturday  . Gout   . Gout   . Heart murmur    followed by dr. Clayborn Bigness  .  Hyperlipidemia   . Hypertension   . Hypertrophy of prostate without urinary obstruction and other lower urinary tract symptoms (LUTS)   . Impotence of organic origin   . Microalbuminuria   . 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 Obesity, unspecified   . Plantar wart   . Sleep apnea    years ago but never used a cpap  . Synovitis and tenosynovitis, unspecified   . Type II or unspecified type diabetes mellitus without mention of complication, uncontrolled   . Unspecified venous (peripheral) insufficiency   . UTI (urinary tract infection)    frequent with ESBL      Surgical History:  Past Surgical History:  Procedure Laterality Date  . AV FISTULA PLACEMENT Right 03/08/2018   Procedure: ARTERIOVENOUS (AV) FISTULA CREATION;  Surgeon: Katha Cabal, MD;  Location: [facility] ORS;  Service: Vascular;  Laterality: Right;  . DIALYSIS/PERMA CATHETER INSERTION N/A 09/28/2017   Procedure: DIALYSIS/PERMA CATHETER INSERTION;  Surgeon: Katha Cabal, MD;  Location: [place] [facility];  Service: Cardiovascular;  Laterality: N/A;  . DIALYSIS/PERMA CATHETER INSERTION     peritoneal catheter  . foot surgery Right    repair of calus  . HIP PINNING,CANNULATED Right 05/16/2018   Procedure: CANNULATED HIP PINNING;  Surgeon: Hessie Knows, MD;  Location: [facility] ORS;  Service: Orthopedics;  Laterality: Right;  . REMOVAL OF A DIALYSIS CATHETER N/A 03/08/2018   Procedure: REMOVAL OF A DIALYSIS CATHETER;  Surgeon: Katha Cabal, MD;  Location:  [facility] ORS;  Service: Vascular;  Laterality: N/A;     Home Meds: Prior to Admission medications   Medication Sig Start Date End Date Taking? Authorizing Provider  allopurinol (ZYLOPRIM) 100 MG tablet Take 0.5 tablets (50 mg total) by mouth every other day. 04/26/18 07/25/18 Yes Sowles, Drue Stager, MD  aspirin EC 81 MG tablet Take 81 mg by mouth daily.   Yes [provider]  atorvastatin (LIPITOR) 40 MG tablet Take 1 tablet (40 mg total) by mouth  daily. 01/11/18  Yes Sowles, Drue Stager, MD  calcitRIOL (ROCALTROL) 0.25 MCG capsule Take 0.25 mcg by mouth daily.  04/02/17  Yes [provider]  cholecalciferol (VITAMIN D3) 25 MCG (1000 UT) tablet Take 2 capsules by mouth daily.    Yes [provider]  Cyanocobalamin (VITAMIN B-12) 1000 MCG SUBL Take 1 tablet by mouth daily.   Yes [provider]  docusate sodium (COLACE) 100 MG capsule Take 100 mg by mouth daily.   Yes [provider]  Multiple Vitamin (MULTIVITAMINS PO) Take 1 tablet by mouth daily.   Yes [provider]  Olmesartan-amLODIPine-HCTZ 40-10-25 MG TABS Take 1 tablet by mouth daily. 01/11/18  Yes Sowles, Drue Stager, MD  tamsulosin (FLOMAX) 0.4 MG CAPS capsule TAKE 1 CAPSULE (0.4 MG TOTAL) BY MOUTH DAILY AFTER BREAKFAST. 04/09/18  Yes Sowles, Drue Stager, MD  TRULICITY 1.5 ZD/6.3OV SOPN Inject 1.5 mg into the skin See admin instructions. Injects on Sundays 11/21/17  Yes [provider]  carvedilol (COREG) 6.25 MG tablet Take 1 tablet (6.25 mg total) by mouth 2 (two) times daily with a meal. 05/22/18   Vaughan Basta, MD  enoxaparin (LOVENOX) 40 MG/0.4ML injection Inject 0.4 mLs (40 mg total) into the skin daily. 05/17/18   Reche Dixon, PA-C  furosemide (LASIX) 20 MG tablet Take 1 tablet (20 mg total) by mouth daily. 05/23/18   Vaughan Basta, MD  oxyCODONE (OXY IR/ROXICODONE) 5 MG immediate release tablet Take 1 tablet (5 mg total) by mouth every 4 (four) hours as needed for moderate pain, severe pain or breakthrough pain. 05/17/18   Reche Dixon, PA-C  polyethylene glycol (MIRALAX / GLYCOLAX) 17 g packet Take 17 g by mouth daily as needed for mild constipation. 05/21/18   Vaughan Basta, MD  traMADol (ULTRAM) 50 MG tablet Take 1 tablet (50 mg total) by mouth every 6 (six) hours as needed (mild pain). 05/17/18   Reche Dixon, PA-C    Inpatient Medications:  . allopurinol  50 mg Oral QODAY  . aspirin EC  81 mg Oral Daily  .  atorvastatin  40 mg Oral Daily  . calcium acetate  1,334 mg Oral TID WC  . Chlorhexidine Gluconate Cloth  6 each Topical Q0600  . diltiazem  60 mg Oral Q8H  . docusate sodium  100 mg Oral BID  . epoetin (EPOGEN/PROCRIT) injection  4,000 Units Intravenous Q T,Th,Sa-HD  . heparin injection (subcutaneous)  5,000 Units Subcutaneous Q8H  . insulin aspart  0-5 Units Subcutaneous QHS  . insulin aspart  0-9 Units Subcutaneous TID WC  . tamsulosin  0.4 mg Oral QPC breakfast     Allergies: No Known Allergies  Social History   Socioeconomic History  . Marital status: Married    Spouse name: frances  . Number of children: 2  . Years of education: Not on file  . Highest education level: Not on file  Occupational History  . Occupation:  Engineer, manufacturing systems,     Comment: retired  Scientific laboratory technician  . Emergency planning/management officer  strain: Somewhat hard  . Food insecurity:    Worry: Never true    Inability: Never true  . Transportation needs:    Medical: No    Non-medical: No  Tobacco Use  . Smoking status: Former Smoker    Packs/day: 0.25    Years: 30.00    Pack years: 7.50    Types: Cigarettes, Cigars    Last attempt to quit: 07/23/2017    Years since quitting: 0.8  . Smokeless tobacco: Never Used  Substance and Sexual Activity  . Alcohol use: Yes    Alcohol/week: 0.0 standard drinks    Comment: occasional  . Drug use: No  . Sexual activity: Not Currently  Lifestyle  . Physical activity:    Days per week: 0 days    Minutes per session: 0 min  . Stress: Not on file  Relationships  . Social connections:    Talks on phone: Twice a week    Gets together: Once a week    Attends religious service: Never    Active member of club or organization: No    Attends meetings of clubs or organizations: Never    Relationship status: Married  . Intimate partner violence:    Fear of current or ex partner: No    Emotionally abused: No    Physically abused: No    Forced sexual activity: No  Other Topics  Concern  . Not on file  Social History Narrative   Lives with wife   Very poor balance, uses wheelchair     Family History  Problem Relation Age of Onset  . Hypertension Mother   . Kidney disease Neg Hx   . Prostate cancer Neg Hx      Review of Systems: A 12-system review of systems was performed and is negative except as noted in the HPI.  Labs: No results for input(s): CKTOTAL, CKMB, TROPONINI in the last 72 hours. Lab Results  Component Value Date   WBC 7.5 05/23/2018   HGB 10.5 (L) 05/23/2018   HCT 32.6 (L) 05/23/2018   MCV 88.8 05/23/2018   PLT 217 05/23/2018    Recent Labs  Lab 05/20/18 0436 05/23/18 0423  NA 136  --   K 4.9  --   CL 100  --   CO2 25  --   BUN 52*  --   CREATININE 6.41* 5.89*  CALCIUM 8.8*  --   GLUCOSE 139*  --    Lab Results  Component Value Date   CHOL 66 09/26/2017   HDL 29 (L) 09/26/2017   LDLCALC 28 09/26/2017   TRIG 44 09/26/2017   No results found for: DDIMER  Radiology/Studies:  Dg Chest 1 View  Result Date: 05/20/2018 CLINICAL DATA:  Hypoxia. EXAM: CHEST  1 VIEW COMPARISON:  One-view chest x-ray 05/18/2018 FINDINGS: Heart is enlarged. Atherosclerotic calcifications are present at the aortic arch. Diffuse interstitial and airspace pattern has increased. IMPRESSION: 1. Cardiomegaly with diffuse increased interstitial and airspace opacity. While this likely represents edema and congestive heart failure, infection is not excluded. Electronically Signed   By: San Morelle M.D.   On: 05/20/2018 08:54   Dg Chest 1 View  Result Date: 05/16/2018 CLINICAL DATA:  73 year old male with a history of fever and hypertension EXAM: CHEST  1 VIEW COMPARISON:  05/15/2018, 01/31/2018 FINDINGS: Cardiomediastinal silhouette unchanged in size and contour. Similar appearance of reticulonodular opacities throughout the lungs, worsened from the plain film of 01/31/2018 and similar to 05/15/2018 No new confluent airspace  disease.  No pleural  effusion. IMPRESSION: Similar appearance of mixed interstitial and airspace disease, potentially edema or multifocal infection. No large pleural effusion or pneumothorax. Electronically Signed   By: Corrie Mckusick D.O.   On: 05/16/2018 15:10   Dg Chest Port 1 View  Result Date: 05/18/2018 CLINICAL DATA:  Hypoxia EXAM: PORTABLE CHEST 1 VIEW COMPARISON:  05/16/2018, 05/15/2018, 01/31/2018, CT chest 12/12/2017 FINDINGS: Diffuse bilateral reticular opacity. Emphysematous disease. Streaky atelectasis left base. No pleural effusion. Stable enlarged cardiomediastinal silhouette. Aortic atherosclerosis. No pneumothorax. Central congestion IMPRESSION: 1. Cardiomegaly with central congestion. Mild diffuse increased interstitial opacity, suspect for acute interstitial edema or inflammatory process on underlying chronic change 2. Emphysematous disease Electronically Signed   By: Donavan Foil M.D.   On: 05/18/2018 18:14   Dg Chest Portable 1 View  Result Date: 05/15/2018 CLINICAL DATA:  Right hip pain. EXAM: PORTABLE CHEST 1 VIEW COMPARISON:  January 31, 2018 FINDINGS: The dialysis catheter is been removed. No pneumothorax. The heart size borderline. The hila and mediastinum are unremarkable. Increased interstitial prominence. IMPRESSION: Increased interstitial markings in the lungs consistent with pulmonary venous congestion/mild edema. Interval removal of right central line. No other abnormalities. Electronically Signed   By: Dorise Bullion III M.D   On: 05/15/2018 17:13   Dg Hip Operative Unilat W Or W/o Pelvis Right  Result Date: 05/16/2018 CLINICAL DATA:  Hip replacement EXAM: OPERATIVE right HIP (WITH PELVIS IF PERFORMED) 2 VIEWS TECHNIQUE: Fluoroscopic spot image(s) were submitted for interpretation post-operatively. COMPARISON:  05/15/2018 FINDINGS: Two low resolution intraoperative spot views of the right hip. Total fluoroscopy time was 1 minutes 12 seconds. The images demonstrate threaded screw fixation of  right femoral neck fracture with normal alignment. IMPRESSION: Intraoperative fluoroscopic assistance provided during surgical fixation of right hip fracture. Electronically Signed   By: Donavan Foil M.D.   On: 05/16/2018 18:29   Dg Hip Unilat W Or Wo Pelvis 2-3 Views Right  Result Date: 05/15/2018 CLINICAL DATA:  Right hip pain status post fall EXAM: DG HIP (WITH OR WITHOUT PELVIS) 2-3V RIGHT COMPARISON:  None. FINDINGS: Acute nondisplaced right subcapital femoral neck fracture. No other fracture or dislocation. Generalized osteopenia. IMPRESSION: 1. Acute nondisplaced right subcapital femoral neck fracture. Electronically Signed   By: Kathreen Devoid   On: 05/15/2018 17:11    Wt Readings from Last 3 Encounters:  05/24/18 109.8 kg  04/01/18 90.7 kg  03/08/18 90.7 kg    EKG: Atrial fibrillation with rapid ventricular response  Physical Exam:  Blood pressure (!) 120/56, pulse 82, temperature 98.2 F (36.8 C), temperature source Oral, resp. rate 19, height 6\' 5"  (1.956 m), weight 109.8 kg, SpO2 100 %. Body mass index is 28.7 kg/m. General: Well developed, well nourished, in no acute distress. Head: Normocephalic, atraumatic, sclera non-icteric, no xanthomas, nares are without discharge.  Neck: Negative for carotid bruits. JVD not elevated. Lungs: Clear bilaterally to auscultation without wheezes, rales, or rhonchi. Breathing is unlabored. Heart: Irregular regular rhythm with 2/6 systolic murmur. Abdomen: Soft, non-tender, non-distended with normoactive bowel sounds. No hepatomegaly. No rebound/guarding. No obvious abdominal masses. Msk:  Strength and tone appear normal for age. Extremities: No clubbing or cyanosis. No edema.  Distal pedal pulses are 2+ and equal bilaterally. Neuro: Alert and oriented X 3. No facial asymmetry. No focal deficit. Moves all extremities spontaneously. Psych:  Responds to questions appropriately with a normal affect.     Assessment and Plan  73 year old male  with history of hypertension, hyperlipidemia and end-stage renal disease  on hemodialysis who was admitted after a hip fracture suffered after mechanical fall.  He is postop day 6 hemiarthroplasty.  He is now in atrial fibrillation with variable but fairly rapid ventricular response.  Appears hemodynamically stable.  At present would continue to treat with diltiazem 60 every 8 following rate response.  Patient had refused the medication this morning however had a discussion with him and he agrees to take the medication.  Consideration for long-term anticoagulation prior to discharge will need to be discussed however would defer at present until determination of patient's compliance can be ascertained.  Signed, Teodoro Spray MD 05/24/2018, 10:47 AM Pager: [phone]

## 2018-05-24 NOTE — Care Management Important Message (Signed)
Important Message  Patient Details  Name: Joseph Newman MRN: [id number] Date of Birth: 1945-03-11   Medicare Important Message Given:  Yes    Jerl Munyan A Kentrell Guettler, RN 05/24/2018, 3:27 PM

## 2018-05-24 NOTE — Progress Notes (Signed)
Report called and given to Katie at [facility]. IV removed. EMS called for transport. Patient dressed in transport gown and is ready to go.  Harvest Dark, RN

## 2018-05-24 NOTE — Consult Note (Signed)
ANTICOAGULATION CONSULT NOTE - Initial Consult  Pharmacy Consult for Eliquis Indication: atrial fibrillation  No Known Allergies  Patient Measurements: Height: 6\' 5"  (195.6 cm) Weight: 242 lb (109.8 kg) IBW/kg (Calculated) : 89.1  Vital Signs: Temp: 98.2 F (36.8 C) (05/01 0511) Temp Source: Oral (05/01 0511) BP: 120/56 (05/01 0511) Pulse Rate: 82 (05/01 0511)  Labs: Recent Labs    05/23/18 0423  HGB 10.5*  HCT 32.6*  PLT 217  CREATININE 5.89*    Estimated Creatinine Clearance: 15.2 mL/min (A) (by C-G formula based on SCr of 5.89 mg/dL (H)).   Medical History: Past Medical History:  Diagnosis Date  . Acute metabolic encephalopathy 73/41/9379   Per my chart  . Arthritis   . Chronic airway obstruction, not elsewhere classified    worked in [employer] and was exposed to [employer]  . 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 Holley Raring, nephrologist.  on dialysis tu - thur - sat  . Depressive disorder, not elsewhere classified   . Dialysis patient [facility]) 01/2018   dialysis tuesday - thursday - saturday  . Gout   . Gout   . Heart murmur    followed by dr. Clayborn Bigness  . Hyperlipidemia   . Hypertension   . Hypertrophy of prostate without urinary obstruction and other lower urinary tract symptoms (LUTS)   . Impotence of organic origin   . Microalbuminuria   . 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 Obesity, unspecified   . Plantar wart   . Sleep apnea    years ago but never used a cpap  . Synovitis and tenosynovitis, unspecified   . Type II or unspecified type diabetes mellitus without mention of complication, uncontrolled   . Unspecified venous (peripheral) insufficiency   . UTI (urinary tract infection)    frequent with ESBL    Medications:  No anticoagulant prior to admission  Assessment: Pharmacy has been consulted for Eliquis dosing in this 37 y old male with a fib.  Plan:  Eliquis 5mg  bid (Scr > 1.5 on HD, age <  14, wt > 60kg)  Lu Duffel, PharmD, BCPS Clinical Pharmacist 05/24/2018 12:06 PM

## 2018-06-21 ENCOUNTER — Telehealth: Payer: Self-pay | Admitting: Family Medicine

## 2018-06-21 NOTE — Telephone Encounter (Unsigned)
Copied from [place] [phone]. Topic: Quick Communication - Home Health Verbal Orders >> Jun 21, 2018 11:36 AM Yvette Rack wrote: Caller/Agency: Lattie Haw with [facility] Callback Number: [phone]  Requesting OT/PT/Skilled Nursing/Social Work/Speech Therapy: OT Frequency: 1 time a week for 1 week, 2 times a week for 3 weeks, and 1 time for 1 week

## 2018-06-24 ENCOUNTER — Encounter: Payer: Self-pay | Admitting: Family Medicine

## 2018-06-24 ENCOUNTER — Ambulatory Visit (INDEPENDENT_AMBULATORY_CARE_PROVIDER_SITE_OTHER): Payer: Medicare Other | Admitting: Family Medicine

## 2018-06-24 ENCOUNTER — Other Ambulatory Visit: Payer: Self-pay

## 2018-06-24 ENCOUNTER — Other Ambulatory Visit: Payer: Medicare Other | Admitting: Urology

## 2018-06-24 VITALS — BP 124/69 | HR 109 | Temp 98.4°F | Wt 195.0 lb

## 2018-06-24 DIAGNOSIS — N186 End stage renal disease: Secondary | ICD-10-CM

## 2018-06-24 DIAGNOSIS — L97512 Non-pressure chronic ulcer of other part of right foot with fat layer exposed: Secondary | ICD-10-CM | POA: Insufficient documentation

## 2018-06-24 DIAGNOSIS — R310 Gross hematuria: Secondary | ICD-10-CM

## 2018-06-24 DIAGNOSIS — N289 Disorder of kidney and ureter, unspecified: Secondary | ICD-10-CM

## 2018-06-24 DIAGNOSIS — E1169 Type 2 diabetes mellitus with other specified complication: Secondary | ICD-10-CM

## 2018-06-24 DIAGNOSIS — E46 Unspecified protein-calorie malnutrition: Secondary | ICD-10-CM

## 2018-06-24 DIAGNOSIS — N2581 Secondary hyperparathyroidism of renal origin: Secondary | ICD-10-CM

## 2018-06-24 DIAGNOSIS — I120 Hypertensive chronic kidney disease with stage 5 chronic kidney disease or end stage renal disease: Secondary | ICD-10-CM

## 2018-06-24 DIAGNOSIS — I5032 Chronic diastolic (congestive) heart failure: Secondary | ICD-10-CM | POA: Insufficient documentation

## 2018-06-24 DIAGNOSIS — E785 Hyperlipidemia, unspecified: Secondary | ICD-10-CM

## 2018-06-24 DIAGNOSIS — Z8781 Personal history of (healed) traumatic fracture: Secondary | ICD-10-CM

## 2018-06-24 DIAGNOSIS — F320 Major depressive disorder, single episode, mild: Secondary | ICD-10-CM

## 2018-06-24 MED ORDER — TRULICITY 1.5 MG/0.5ML ~~LOC~~ SOAJ: 1.5000 mg | SUBCUTANEOUS | 0 refills | Status: DC

## 2018-06-24 MED ORDER — CALCITRIOL 0.25 MCG PO CAPS: 0.2500 ug | ORAL_CAPSULE | Freq: Every day | ORAL | 1 refills | Status: AC

## 2018-06-24 NOTE — Telephone Encounter (Signed)
Called and left a message notifying Lattie Haw that verbal orders were approved by PCP.

## 2018-06-24 NOTE — Progress Notes (Signed)
Will call with results

## 2018-06-24 NOTE — Progress Notes (Signed)
Name: Joseph Newman   MRN: [id number]    DOB: [birth year]   Date:06/24/2018       Progress Note  Subjective  Chief Complaint  Chief Complaint  Patient presents with   Medication Refill   Follow-up    surgery done, staples were removed last Friday. Left side hurting. CT scan ordered but not done yet.    I connected with  [facility]  on 06/24/18 at  2:20 PM EDT by a video enabled telemedicine application and verified that I am speaking with the correct person using two identifiers.  I discussed the limitations of evaluation and management by telemedicine and the availability of in person appointments. The patient expressed understanding and agreed to proceed. Staff also discussed with the patient that there may be a patient responsible charge related to this service. Patient Location: at home Provider Location: [facility] Additional Individuals present: wife   HPI  HTN: he has been compliant with his medications and denies side effects. His wife was present during our conversation through telemedicine. She manages his medications  He  deniesdizziness, palpitationor chest pain.BP has been at goal at home 124/69  Hyperlipidemia:he is taking  Atorvastatin, denies myalgias.  COPD: he quit smoking May 2019  he is coughing less, usually dry. No wheezing or SOB . He was admitted in April with acute respiratory failure and hip fracture but since discharge back to his baseline   DM:he was going to Endo at [facility] back in September was 7.3% but no recent A1C levels. Advised to stop by for labs when he goes for CT abdomen and pelvis, he has difficulty ambulating  , fasting glucose at home is in the low 100's. He denies polyphagia, polydipsia, he is still able to void even though on HD,  He had an AV fistula placed on 02/2018 by Dr. Erven Colla Glucose at home has been 102-151, usually in the mid 120's. He has a recurrent ulcer on right foot, using topical  antibiotics and home health nurse is checking, used to see Dr. Amalia Hailey but not recently . He is going to HD , T, T and Saturdays.   Malnutrition: weight at HD went from 239 lbs to 195  lbs on April   Depression Major:he denies depression, he was given celexa but refused to take it, wife thinks he is doing better now  Recent hip fracture, not a candidate for diphosphonate, he is very frail, discussed further risk of fractures and to consider seeing Endo and possible prolia as form of treatment  Hematuria: he has a history of gross hematuria, and seen by Dr. Bernardo Heater today and will have CT abdomen pelvis   Chronic congestive Diastolic failure: he has some SOB with activity, last EF was normal, he has concentric LV hypertrophy , he has orthopnea but stable  Patient Active Problem List   Diagnosis Date Noted   Malnutrition due to renal disease ([place]) 06/24/2018   Ulcer of right foot with fat layer exposed ([place]) 06/24/2018   Chronic diastolic congestive heart failure ([place]) 06/24/2018   Mild major depression ([place]) 06/24/2018   Hyperparathyroidism, secondary renal ([place]) 06/24/2018   Pressure injury of skin 05/23/2018   Closed right hip fracture ([place]) 05/15/2018   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 ([place]) 01/30/2018   End stage renal disease ([place]) 12/31/2017   Chronic gouty arthritis 10/22/2017   Hyperlipidemia associated with type 2 diabetes mellitus ([place]) 09/06/2017   Chronic kidney disease (CKD), stage V ([place]) 07/26/2017   Dyslipidemia associated  with type 2 diabetes mellitus ([place]) 03/13/2016   Diabetic ulcer of toe of right foot associated with type 2 diabetes mellitus, limited to breakdown of skin ([place]) 03/07/2016   Type 2 diabetes mellitus with diabetic nephropathy, without long-term current use of insulin ([place]) 06/17/2015   Microscopic hematuria 05/30/2015   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 ([place]) 12/31/2014   Dyslipidemia 08/31/2014   Diabetes  mellitus with renal manifestation ([place]) 08/31/2014   COPD (chronic obstructive pulmonary disease) ([place]) 08/31/2014   Microalbuminuria 08/31/2014   ED (erectile dysfunction) 08/31/2014   Hypertension     Past Surgical History:  Procedure Laterality Date   AV FISTULA PLACEMENT Right 03/08/2018   Procedure: ARTERIOVENOUS (AV) FISTULA CREATION;  Surgeon: Katha Cabal, MD;  Location: [facility];  Service: Vascular;  Laterality: Right;   DIALYSIS/PERMA CATHETER INSERTION N/A 09/28/2017   Procedure: DIALYSIS/PERMA CATHETER INSERTION;  Surgeon: Katha Cabal, MD;  Location: [facility];  Service: Cardiovascular;  Laterality: N/A;   DIALYSIS/PERMA CATHETER INSERTION     peritoneal catheter   foot surgery Right    repair of calus   HIP PINNING,CANNULATED Right 05/16/2018   Procedure: CANNULATED HIP PINNING;  Surgeon: Hessie Knows, MD;  Location: [facility];  Service: Orthopedics;  Laterality: Right;   REMOVAL OF A DIALYSIS CATHETER N/A 03/08/2018   Procedure: REMOVAL OF A DIALYSIS CATHETER;  Surgeon: Katha Cabal, MD;  Location: [facility];  Service: Vascular;  Laterality: N/A;    Family History  Problem Relation Age of Onset   Hypertension Mother    Kidney disease Neg Hx    Prostate cancer Neg Hx     Social History   Socioeconomic History   Marital status: Married    Spouse name: frances   Number of children: 2   Years of education: Not on file   Highest education level: Not on file  Occupational History   Occupation:  Engineer, manufacturing systems,     Comment: retired  Scientist, product/process development strain: Somewhat hard   Food insecurity:    Worry: Never true    Inability: Never true   Transportation needs:    Medical: No    Non-medical: No  Tobacco Use   Smoking status: Former Smoker    Packs/day: 0.25    Years: 30.00    Pack years: 7.50    Types: Cigarettes, Cigars    Last attempt to quit: 07/23/2017    Years since quitting: 0.9    Smokeless tobacco: Never Used  Substance and Sexual Activity   Alcohol use: Yes    Alcohol/week: 0.0 standard drinks    Comment: occasional   Drug use: No   Sexual activity: Not Currently  Lifestyle   Physical activity:    Days per week: 0 days    Minutes per session: 0 min   Stress: Not on file  Relationships   Social connections:    Talks on phone: Twice a week    Gets together: Once a week    Attends religious service: Never    Active member of club or organization: No    Attends meetings of clubs or organizations: Never    Relationship status: Married   Intimate partner violence:    Fear of current or ex partner: No    Emotionally abused: No    Physically abused: No    Forced sexual activity: No  Other Topics Concern   Not on file  Social History Narrative   Lives with  wife   Very poor balance, uses wheelchair     Current Outpatient Medications:    allopurinol (ZYLOPRIM) 100 MG tablet, Take 0.5 tablets (50 mg total) by mouth every other day., Disp: 23 tablet, Rfl: 0   apixaban (ELIQUIS) 5 MG TABS tablet, Take 1 tablet (5 mg total) by mouth 2 (two) times daily., Disp: 60 tablet, Rfl: 0   aspirin EC 81 MG tablet, Take 81 mg by mouth daily., Disp: , Rfl:    atorvastatin (LIPITOR) 40 MG tablet, Take 1 tablet (40 mg total) by mouth daily., Disp: 90 tablet, Rfl: 0   calcitRIOL (ROCALTROL) 0.25 MCG capsule, Take 0.25 mcg by mouth daily. , Disp: , Rfl:    cholecalciferol (VITAMIN D3) 25 MCG (1000 UT) tablet, Take 2 capsules by mouth daily. , Disp: , Rfl:    Cyanocobalamin (VITAMIN B-12) 1000 MCG SUBL, Take 1 tablet by mouth daily., Disp: , Rfl:    diltiazem (CARDIZEM CD) 180 MG 24 hr capsule, Take 1 capsule (180 mg total) by mouth daily., Disp: 30 capsule, Rfl: 0   docusate sodium (COLACE) 100 MG capsule, Take 100 mg by mouth daily., Disp: , Rfl:    furosemide (LASIX) 20 MG tablet, Take 1 tablet (20 mg total) by mouth daily., Disp: 30 tablet, Rfl: 0    Multiple Vitamin (MULTIVITAMINS PO), Take 1 tablet by mouth daily., Disp: , Rfl:    oxyCODONE (OXY IR/ROXICODONE) 5 MG immediate release tablet, Take 1 tablet (5 mg total) by mouth every 4 (four) hours as needed for moderate pain, severe pain or breakthrough pain., Disp: 30 tablet, Rfl: 0   polyethylene glycol (MIRALAX / GLYCOLAX) 17 g packet, Take 17 g by mouth daily as needed for mild constipation., Disp: 14 each, Rfl: 0   sertraline (ZOLOFT) 25 MG tablet, Take 1 tablet (25 mg total) by mouth daily., Disp: 30 tablet, Rfl: 2   tamsulosin (FLOMAX) 0.4 MG CAPS capsule, TAKE 1 CAPSULE (0.4 MG TOTAL) BY MOUTH DAILY AFTER BREAKFAST., Disp: 30 capsule, Rfl: 6   traMADol (ULTRAM) 50 MG tablet, Take 1 tablet (50 mg total) by mouth every 6 (six) hours as needed (mild pain)., Disp: 30 tablet, Rfl: 1   TRULICITY 1.5 IR/4.4RX SOPN, Inject 1.5 mg into the skin See admin instructions. Injects on Sundays, Disp: , Rfl:   No Known Allergies  I personally reviewed active problem list, medication list, allergies, family history, social history, health maintenance with the patient/caregiver today.   ROS  Ten systems reviewed and is negative except as mentioned in HPI   Objective  Virtual encounter, vitals not obtained.  Body mass index is 23.12 kg/m.  Physical Exam  Awake, alert but seems a little lethargic -laying in a hospital bed   PHQ2/9: Depression screen [facility] 2/9 06/24/2018 01/30/2018 01/09/2018 12/19/2017 06/15/2017  Decreased Interest 0 0 0 0 3  Down, Depressed, Hopeless 0 0 0 0 0  PHQ - 2 Score 0 0 0 0 3  Altered sleeping 0 0 0 - 3  Tired, decreased energy 0 0 0 - 3  Change in appetite 0 0 0 - 0  Feeling bad or failure about yourself  0 0 0 - 2  Trouble concentrating 0 0 0 - 0  Moving slowly or fidgety/restless 0 0 0 - 0  Suicidal thoughts 0 0 0 - 0  PHQ-9 Score 0 0 0 - 11  Difficult doing work/chores - Not difficult at all Not difficult at all - Not difficult at all  PHQ-2/9 Result  is negative.    Fall Risk: Fall Risk  06/24/2018 01/30/2018 01/09/2018 12/19/2017 06/15/2017  Falls in the past year? 1 0 0 0 No  Number falls in past yr: 1 0 0 - -  Injury with Fall? 1 0 0 - -  Follow up - Falls evaluation completed - - -     Assessment & Plan  1. Malnutrition due to renal disease ([place])  Weight is stable now  2. Ulcer of right foot with fat layer exposed [facility])  Home health nurse is monitoring   3. Dyslipidemia associated with type 2 diabetes mellitus (HCC)  - TRULICITY 1.5 EX/6.1YJ SOPN; Inject 1.5 mg into the skin See admin instructions. Injects on Sundays  Dispense: 12 pen; Refill: 0  4. Chronic diastolic congestive heart failure (HCC)  Under the care of Dr. Clayborn Bigness, mild orthopnea  5. Mild major depression (HCC)  Seems to be in remission   6. Hyperparathyroidism, secondary renal ([place])  Monitored by nephrologist   7. End stage renal disease ([place])  HD three times a week   8. Benign hypertension with ESRD (end-stage renal disease) ([place])  bp is at goal   9. Hematuria, gross  Being evaluated by Dr. Bernardo Heater   10. History of hip fracture  - calcitRIOL (ROCALTROL) 0.25 MCG capsule; Take 1 capsule (0.25 mcg total) by mouth daily.  Dispense: 90 capsule; Refill: 1  I discussed the assessment and treatment plan with the patient. The patient was provided an opportunity to ask questions and all were answered. The patient agreed with the plan and demonstrated an understanding of the instructions.  The patient was advised to call back or seek an in-person evaluation if the symptoms worsen or if the condition fails to improve as anticipated.  I provided 25 minutes of non-face-to-face time during this encounter.

## 2018-07-05 ENCOUNTER — Ambulatory Visit (INDEPENDENT_AMBULATORY_CARE_PROVIDER_SITE_OTHER): Payer: Medicare Other

## 2018-07-05 VITALS — BP 92/57 | HR 115 | Ht 77.0 in | Wt 215.6 lb

## 2018-07-05 DIAGNOSIS — D649 Anemia, unspecified: Secondary | ICD-10-CM | POA: Insufficient documentation

## 2018-07-05 DIAGNOSIS — Z0000 Encounter for general adult medical examination without abnormal findings: Secondary | ICD-10-CM

## 2018-07-05 DIAGNOSIS — E785 Hyperlipidemia, unspecified: Secondary | ICD-10-CM | POA: Insufficient documentation

## 2018-07-05 NOTE — Progress Notes (Addendum)
Subjective:   Joseph Newman is a 73 y.o. male who presents for an Initial Medicare Annual Wellness Visit.  Virtual Visit via Telephone Note  I connected with [facility] on 07/05/18 at 11:20 AM EDT by telephone and verified that I am speaking with the correct person using two identifiers.  Medicare Annual Wellness visit completed telephonically due to Covid-19 pandemic.  Location: Patient: home Provider: office   I discussed the limitations, risks, security and privacy concerns of performing an evaluation and management service by telephone and the availability of in person appointments. The patient expressed understanding and agreed to proceed.  Some vital signs may be absent or patient reported.   Clemetine Marker, LPN   Review of Systems   Cardiac Risk Factors include: advanced age (>38men, >30 women);diabetes mellitus;hypertension;male gender;dyslipidemia;sedentary lifestyle    Objective:    Today's Vitals   07/05/18 1230  BP: (!) 92/57  Pulse: (!) 115  Weight: 215 lb 9.6 oz (97.8 kg)  Height: 6\' 5"  (1.956 m)   Body mass index is 25.57 kg/m.  Advanced Directives 07/05/2018 05/16/2018 05/15/2018 05/15/2018 03/08/2018 02/15/2018 01/31/2018  Does Patient Have a Medical Advance Directive? Yes No No No Yes Yes No  Type of Paramedic of [place];Out of facility DNR (pink MOST or yellow form) - - - Press photographer;Living will [place];Living will -  Does patient want to make changes to medical advance directive? - - - - No - Patient declined No - Patient declined -  Copy of [place] in Chart? No - copy requested - - - No - copy requested No - copy requested -  Would patient like information on creating a medical advance directive? - No - Patient declined No - Patient declined - No - Patient declined No - Patient declined No - Patient declined    Current Medications (verified) Outpatient Encounter Medications as  of 07/05/2018  Medication Sig  . allopurinol (ZYLOPRIM) 100 MG tablet Take 0.5 tablets (50 mg total) by mouth every other day.  Marland Kitchen apixaban (ELIQUIS) 5 MG TABS tablet Take 1 tablet (5 mg total) by mouth 2 (two) times daily.  Marland Kitchen aspirin EC 81 MG tablet Take 81 mg by mouth daily.  Marland Kitchen atorvastatin (LIPITOR) 40 MG tablet Take 1 tablet (40 mg total) by mouth daily.  . calcitRIOL (ROCALTROL) 0.25 MCG capsule Take 1 capsule (0.25 mcg total) by mouth daily.  . carvedilol (COREG) 6.25 MG tablet Take 6.25 mg by mouth 2 (two) times daily.  . cholecalciferol (VITAMIN D3) 25 MCG (1000 UT) tablet Take 2 capsules by mouth daily.   . Cyanocobalamin (VITAMIN B-12) 1000 MCG SUBL Take 1 tablet by mouth daily.  Marland Kitchen docusate sodium (COLACE) 100 MG capsule Take 100 mg by mouth daily.  Marland Kitchen lidocaine-prilocaine (EMLA) cream   . Multiple Vitamin (MULTIVITAMINS PO) Take 1 tablet by mouth daily.  . Olmesartan-amLODIPine-HCTZ 40-10-25 MG TABS Take by mouth.  . sevelamer carbonate (RENVELA) 800 MG tablet TAKE 2 TABLETS BY MOUTH THREE TIMES A DAY WITH MEALS  . tamsulosin (FLOMAX) 0.4 MG CAPS capsule TAKE 1 CAPSULE (0.4 MG TOTAL) BY MOUTH DAILY AFTER BREAKFAST.  . TRULICITY 1.5 ML/4.6TK SOPN Inject 1.5 mg into the skin See admin instructions. Injects on Sundays  . [DISCONTINUED] diltiazem (CARDIZEM CD) 180 MG 24 hr capsule Take 1 capsule (180 mg total) by mouth daily.  . [DISCONTINUED] furosemide (LASIX) 20 MG tablet Take 1 tablet (20 mg total) by mouth daily.  . [  DISCONTINUED] oxyCODONE (OXY IR/ROXICODONE) 5 MG immediate release tablet Take 1 tablet (5 mg total) by mouth every 4 (four) hours as needed for moderate pain, severe pain or breakthrough pain.  . [DISCONTINUED] polyethylene glycol (MIRALAX / GLYCOLAX) 17 g packet Take 17 g by mouth daily as needed for mild constipation.  . [DISCONTINUED] sertraline (ZOLOFT) 25 MG tablet Take 1 tablet (25 mg total) by mouth daily.  . [DISCONTINUED] traMADol (ULTRAM) 50 MG tablet Take 1  tablet (50 mg total) by mouth every 6 (six) hours as needed (mild pain).   No facility-administered encounter medications on file as of 07/05/2018.     Allergies (verified) Patient has no known allergies.   History: Past Medical History:  Diagnosis Date  . Acute metabolic encephalopathy 54/65/0354   Per my chart  . Arthritis   . Chronic airway obstruction, not elsewhere classified    worked in [employer] and was exposed to [employer]  . 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 Holley Raring, nephrologist.  on dialysis tu - thur - sat  . Depressive disorder, not elsewhere classified   . Dialysis patient [facility]) 01/2018   dialysis tuesday - thursday - saturday  . Gout   . Gout   . Heart murmur    followed by dr. Clayborn Bigness  . Hyperlipidemia   . Hypertension   . Hypertrophy of prostate without urinary obstruction and other lower urinary tract symptoms (LUTS)   . Impotence of organic origin   . Microalbuminuria   . 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 Obesity, unspecified   . Plantar wart   . Sleep apnea    years ago but never used a cpap  . Synovitis and tenosynovitis, unspecified   . Type II or unspecified type diabetes mellitus without mention of complication, uncontrolled   . Unspecified venous (peripheral) insufficiency   . UTI (urinary tract infection)    frequent with ESBL   Past Surgical History:  Procedure Laterality Date  . AV FISTULA PLACEMENT Right 03/08/2018   Procedure: ARTERIOVENOUS (AV) FISTULA CREATION;  Surgeon: Katha Cabal, MD;  Location: [facility] ORS;  Service: Vascular;  Laterality: Right;  . DIALYSIS/PERMA CATHETER INSERTION N/A 09/28/2017   Procedure: DIALYSIS/PERMA CATHETER INSERTION;  Surgeon: Katha Cabal, MD;  Location: [place] [facility];  Service: Cardiovascular;  Laterality: N/A;  . DIALYSIS/PERMA CATHETER INSERTION     peritoneal catheter  . foot surgery Right    repair of calus  . HIP  PINNING,CANNULATED Right 05/16/2018   Procedure: CANNULATED HIP PINNING;  Surgeon: Hessie Knows, MD;  Location: [facility] ORS;  Service: Orthopedics;  Laterality: Right;  . REMOVAL OF A DIALYSIS CATHETER N/A 03/08/2018   Procedure: REMOVAL OF A DIALYSIS CATHETER;  Surgeon: Katha Cabal, MD;  Location: [facility] ORS;  Service: Vascular;  Laterality: N/A;   Family History  Problem Relation Age of Onset  . Hypertension Mother   . Kidney disease Neg Hx   . Prostate cancer Neg Hx    Social History   Socioeconomic History  . Marital status: Married    Spouse name: frances  . Number of children: 2  . Years of education: Not on file  . Highest education level: Not on file  Occupational History  . Occupation:  Engineer, manufacturing systems,     Comment: retired  Scientific laboratory technician  . Financial resource strain: Not very hard  . Food insecurity    Worry: Never true    Inability: Never true  .  Transportation needs    Medical: No    Non-medical: No  Tobacco Use  . Smoking status: Former Smoker    Packs/day: 0.25    Years: 30.00    Pack years: 7.50    Types: Cigarettes, Cigars    Quit date: 07/23/2017    Years since quitting: 0.9  . Smokeless tobacco: Never Used  Substance and Sexual Activity  . Alcohol use: Not Currently    Alcohol/week: 0.0 standard drinks  . Drug use: No  . Sexual activity: Not Currently  Lifestyle  . Physical activity    Days per week: 0 days    Minutes per session: 0 min  . Stress: Not at all  Relationships  . Social Herbalist on phone: Twice a week    Gets together: Once a week    Attends religious service: Never    Active member of club or organization: No    Attends meetings of clubs or organizations: Never    Relationship status: Married  Other Topics Concern  . Not on file  Social History Narrative   Lives with wife   Very poor balance, uses wheelchair   Tobacco Counseling Counseling given: Not Answered   Clinical Intake:  Pre-visit preparation  completed: Yes  Pain : No/denies pain     Nutritional Risks: None Diabetes: Yes CBG done?: No Did pt. bring in CBG monitor from home?: No   Nutrition Risk Assessment:  Has the patient had any N/V/D within the last 2 months?  No  Does the patient have any non-healing wounds?  No  Has the patient had any unintentional weight loss or weight gain?  No   Diabetes:  Is the patient diabetic?  Yes  If diabetic, was a CBG obtained today?  No  Did the patient bring in their glucometer from home?  No  How often do you monitor your CBG's? Daily, today fasting at home 116.   Financial Strains and Diabetes Management:  Are you having any financial strains with the device, your supplies or your medication? No .  Does the patient want to be seen by Chronic Care Management for management of their diabetes?  No  Would the patient like to be referred to a Nutritionist or for Diabetic Management?  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 Overdue for diabetic eye exam. Pt has been advised about the importance in completing this exam.   Diabetic Foot Exam: Completed 12/05/16. Pt has been advised about the importance in completing this exam.  How often do you need to have someone help you when you read instructions, pamphlets, or other written materials from your doctor or pharmacy?: 1 - Never  Interpreter Needed?: No  Information entered by :: Clemetine Marker LPN  Activities of Daily Living In your present state of health, do you have any difficulty performing the following activities: 07/05/2018 06/24/2018  Hearing? N N  Comment declines hearing aids -  Vision? Y Y  Difficulty concentrating or making decisions? N Y  Walking or climbing stairs? Y Y  Dressing or bathing? Y Y  Comment - -  Doing errands, shopping? Tempie Donning  Preparing Food and eating ? Y -  Using the Toilet? Y -  In the past six months, have you accidently leaked urine? Y -  Comment wears  depends for now -  Do you have problems with loss of bowel control? Y -  Managing your Medications? Y -  Managing your Finances? Y -  Housekeeping or managing your Housekeeping? Y -  Some recent data might be hidden     Immunizations and Health Maintenance Immunization History  Administered Date(s) Administered  . Hepatitis B, adult 01/24/2018, 02/26/2018, 03/26/2018  . Influenza, High Dose Seasonal PF 11/04/2014, 11/10/2015, 11/27/2016, 11/03/2017  . PPD Test 09/27/2017  . Pneumococcal Conjugate-13 12/15/2013  . Pneumococcal Polysaccharide-23 06/24/2009, 11/07/2017  . Tdap 06/24/2009  . Zoster 08/01/2011   Health Maintenance Due  Topic Date Due  . OPHTHALMOLOGY EXAM  06/09/2017  . FOOT EXAM  12/05/2017  . HEMOGLOBIN A1C  03/30/2018    Patient Care Team: Steele Sizer, MD as PCP - General Benedetto Goad, RN as Case Manager Cathi Roan, [facility] (Pharmacist) Murlean Iba, MD (Nephrology) Schnier, Dolores Lory, MD (Vascular Surgery) Hessie Knows, MD as Consulting Physician (Orthopedic Surgery) Yolonda Kida, MD as Consulting Physician (Cardiology) Marlowe Sax, MD as Referring Physician (Internal Medicine) Edrick Kins, DPM as Consulting Physician (Podiatry)  Indicate any recent Medical Services you may have received from other than [facility] providers in the past year (date may be approximate).    Assessment:   This is a routine wellness examination for [place].  Hearing/Vision screen  Hearing Screening   125Hz  250Hz  500Hz  1000Hz  2000Hz  3000Hz  4000Hz  6000Hz  8000Hz   Right ear:           Left ear:           Comments: Pt denies hearing difficulty   Vision Screening Comments: Vision screenings at [facility], past due for eye exam   Dietary issues and exercise activities discussed: Current Exercise Habits: The patient does not participate in regular exercise at present, Exercise limited by: orthopedic condition(s)  Goals   None     Depression Screen PHQ 2/9 Scores 07/05/2018 06/24/2018 01/30/2018 01/09/2018  PHQ - 2 Score 0 0 0 0  PHQ- 9 Score - 0 0 0  Exception Documentation - Medical reason - -    Fall Risk Fall Risk  07/05/2018 06/24/2018 01/30/2018 01/09/2018 12/19/2017  Falls in the past year? 1 1 0 0 0  Number falls in past yr: 1 1 0 0 -  Injury with Fall? 1 1 0 0 -  Risk for fall due to : History of fall(s);Impaired balance/gait;Impaired mobility;Impaired vision - - - -  Follow up Falls prevention discussed - Falls evaluation completed - -   FALL RISK PREVENTION PERTAINING TO THE HOME:  Any stairs in or around the home? Yes   Ramp also installed in the garage If so, do they handrails? Yes   Home free of loose throw rugs in walkways, pet beds, electrical cords, etc? Yes  Adequate lighting in your home to reduce risk of falls? Yes   ASSISTIVE DEVICES UTILIZED TO PREVENT FALLS:  Life alert? No  Use of a cane, walker or w/c? Yes  Grab bars in the bathroom? No  Shower chair or bench in shower? Yes  Elevated toilet seat or a handicapped toilet? Yes   DME ORDERS:  DME order needed?  No   TIMED UP AND GO:  Was the test performed? No .   Education: Fall risk prevention has been discussed.  Intervention(s) required? Yes  - install grab bars in bathroom for safety   Cognitive Function: pt declined 6CIT        Screening Tests Health Maintenance  Topic Date Due  . OPHTHALMOLOGY EXAM  06/09/2017  . FOOT EXAM  12/05/2017  . HEMOGLOBIN  A1C  03/30/2018  . INFLUENZA VACCINE  08/24/2018  . TETANUS/TDAP  06/25/2019  . COLONOSCOPY  08/31/2019  . Hepatitis C Screening  Completed  . PNA vac Low Risk Adult  Completed    Qualifies for Shingles Vaccine? Yes  Zostavax completed 2013. Due for Shingrix. Education has been provided regarding the importance of this vaccine. Pt has been advised to call insurance company to determine out of pocket expense. Advised may also receive vaccine at local pharmacy or Health  Dept. Verbalized acceptance and understanding.  Tdap: Up to date  Flu Vaccine: Up to date  Pneumococcal Vaccine: Up to date  Cancer Screenings:  Colorectal Screening: Completed 08/30/09. Repeat every 10 years.  Lung Cancer Screening: (Low Dose CT Chest recommended if Age 11-80 years, 30 pack-year currently smoking OR have quit w/in 15years.) does qualify. Chest CT done 11/2017.   Additional Screening:  Hepatitis C Screening: does qualify; Completed 05/22/18  Vision Screening: Recommended annual ophthalmology exams for early detection of glaucoma and other disorders of the eye. Is the patient up to date with their annual eye exam?  No  Who is the provider or what is the name of the office in which the pt attends annual eye exams? [place] Screening: Recommended annual dental exams for proper oral hygiene  Community Resource Referral:  CRR required this visit?  No       Plan:    I have personally reviewed and addressed the Medicare Annual Wellness questionnaire and have noted the following in the patient's chart:  A. Medical and social history B. Use of alcohol, tobacco or illicit drugs  C. Current medications and supplements D. Functional ability and status E.  Nutritional status F.  Physical activity G. Advance directives H. List of other physicians I.  Hospitalizations, surgeries, and ER visits in previous 12 months J.  [place] such as hearing and vision if needed, cognitive and depression L. Referrals and appointments   In addition, I have reviewed and discussed with patient certain preventive protocols, quality metrics, and best practice recommendations. A written personalized care plan for preventive services as well as general preventive health recommendations were provided to patient.   Signed,  Clemetine Marker, LPN Nurse Health Advisor   Nurse Notes: pt accompanied during visit today by his wife. He was at [facility] place rehab in  May and since being home has [facility] home [facility] coming for physical therapy and showing small improvement in walking with walker and his balance. Pt and his wife appreciative of visit today.

## 2018-07-05 NOTE — Patient Instructions (Signed)
Joseph Newman , Thank you for taking time to come for your Medicare Wellness Visit. I appreciate your ongoing commitment to your health goals. Please review the following plan we discussed and let me know if I can assist you in the future.   Screening recommendations/referrals: Colonoscopy: done 08/30/09. Repeat in 2021. Recommended yearly ophthalmology/optometry visit for glaucoma screening and checkup Recommended yearly dental visit for hygiene and checkup  Vaccinations: Influenza vaccine: done 11/03/17 Pneumococcal vaccine: done 12/15/13 Tdap vaccine: done 06/24/09 Shingles vaccine: Shingrix discussed. Please contact your pharmacy for coverage information.   Advanced directives: Please bring a copy of your health care power of attorney and living will to the office at your convenience.  Conditions/risks identified: continue physical therapy with home health to improve strength and mobility to prevent falls  Next appointment: Please follow up in one year for your Medicare Annual Wellness visit.    Preventive Care 75 Years and Older, Male Preventive care refers to lifestyle choices and visits with your health care provider that can promote health and wellness. What does preventive care include?  A yearly physical exam. This is also called an annual well check.  Dental exams once or twice a year.  Routine eye exams. Ask your health care provider how often you should have your eyes checked.  Personal lifestyle choices, including:  Daily care of your teeth and gums.  Regular physical activity.  Eating a healthy diet.  Avoiding tobacco and drug use.  Limiting alcohol use.  Practicing safe sex.  Taking low doses of aspirin every day.  Taking vitamin and mineral supplements as recommended by your health care provider. What happens during an annual well check? The services and screenings done by your health care provider during your annual well check will depend on your age, overall  health, lifestyle risk factors, and family history of disease. Counseling  Your health care provider may ask you questions about your:  Alcohol use.  Tobacco use.  Drug use.  Emotional well-being.  Home and relationship well-being.  Sexual activity.  Eating habits.  History of falls.  Memory and ability to understand (cognition).  Work and work Statistician. Screening  You may have the following tests or measurements:  Height, weight, and BMI.  Blood pressure.  Lipid and cholesterol levels. These may be checked every 5 years, or more frequently if you are over 82 years old.  Skin check.  Lung cancer screening. You may have this screening every year starting at age 67 if you have a 30-pack-year history of smoking and currently smoke or have quit within the past 15 years.  Fecal occult blood test (FOBT) of the stool. You may have this test every year starting at age 91.  Flexible sigmoidoscopy or colonoscopy. You may have a sigmoidoscopy every 5 years or a colonoscopy every 10 years starting at age 28.  Prostate cancer screening. Recommendations will vary depending on your family history and other risks.  Hepatitis C blood test.  Hepatitis B blood test.  Sexually transmitted disease (STD) testing.  Diabetes screening. This is done by checking your blood sugar (glucose) after you have not eaten for a while (fasting). You may have this done every 1-3 years.  Abdominal aortic aneurysm (AAA) screening. You may need this if you are a current or former smoker.  Osteoporosis. You may be screened starting at age 52 if you are at high risk. Talk with your health care provider about your test results, treatment options, and if necessary, the need  for more tests. Vaccines  Your health care provider may recommend certain vaccines, such as:  Influenza vaccine. This is recommended every year.  Tetanus, diphtheria, and acellular pertussis (Tdap, Td) vaccine. You may need a Td  booster every 10 years.  Zoster vaccine. You may need this after age 8.  Pneumococcal 13-valent conjugate (PCV13) vaccine. One dose is recommended after age 62.  Pneumococcal polysaccharide (PPSV23) vaccine. One dose is recommended after age 70. Talk to your health care provider about which screenings and vaccines you need and how often you need them. This information is not intended to replace advice given to you by your health care provider. Make sure you discuss any questions you have with your health care provider. Document Released: 02/05/2015 Document Revised: 09/29/2015 Document Reviewed: 11/10/2014 Elsevier Interactive Patient Education  2017 [employer] Prevention in the Home Falls can cause injuries. They can happen to people of all ages. There are many things you can do to make your home safe and to help prevent falls. What can I do on the outside of my home?  Regularly fix the edges of walkways and driveways and fix any cracks.  Remove anything that might make you trip as you walk through a door, such as a raised step or threshold.  Trim any bushes or trees on the path to your home.  Use bright outdoor lighting.  Clear any walking paths of anything that might make someone trip, such as rocks or tools.  Regularly check to see if handrails are loose or broken. Make sure that both sides of any steps have handrails.  Any raised decks and porches should have guardrails on the edges.  Have any leaves, snow, or ice cleared regularly.  Use sand or salt on walking paths during winter.  Clean up any spills in your garage right away. This includes oil or grease spills. What can I do in the bathroom?  Use night lights.  Install grab bars by the toilet and in the tub and shower. Do not use towel bars as grab bars.  Use non-skid mats or decals in the tub or shower.  If you need to sit down in the shower, use a plastic, non-slip stool.  Keep the floor dry. Clean up  any water that spills on the floor as soon as it happens.  Remove soap buildup in the tub or shower regularly.  Attach bath mats securely with double-sided non-slip rug tape.  Do not have throw rugs and other things on the floor that can make you trip. What can I do in the bedroom?  Use night lights.  Make sure that you have a light by your bed that is easy to reach.  Do not use any sheets or blankets that are too big for your bed. They should not hang down onto the floor.  Have a firm chair that has side arms. You can use this for support while you get dressed.  Do not have throw rugs and other things on the floor that can make you trip. What can I do in the kitchen?  Clean up any spills right away.  Avoid walking on wet floors.  Keep items that you use a lot in easy-to-reach places.  If you need to reach something above you, use a strong step stool that has a grab bar.  Keep electrical cords out of the way.  Do not use floor polish or wax that makes floors slippery. If you must use wax, use  non-skid floor wax.  Do not have throw rugs and other things on the floor that can make you trip. What can I do with my stairs?  Do not leave any items on the stairs.  Make sure that there are handrails on both sides of the stairs and use them. Fix handrails that are broken or loose. Make sure that handrails are as long as the stairways.  Check any carpeting to make sure that it is firmly attached to the stairs. Fix any carpet that is loose or worn.  Avoid having throw rugs at the top or bottom of the stairs. If you do have throw rugs, attach them to the floor with carpet tape.  Make sure that you have a light switch at the top of the stairs and the bottom of the stairs. If you do not have them, ask someone to add them for you. What else can I do to help prevent falls?  Wear shoes that:  Do not have high heels.  Have rubber bottoms.  Are comfortable and fit you well.  Are  closed at the toe. Do not wear sandals.  If you use a stepladder:  Make sure that it is fully opened. Do not climb a closed stepladder.  Make sure that both sides of the stepladder are locked into place.  Ask someone to hold it for you, if possible.  Clearly mark and make sure that you can see:  Any grab bars or handrails.  First and last steps.  Where the edge of each step is.  Use tools that help you move around (mobility aids) if they are needed. These include:  Canes.  Walkers.  Scooters.  Crutches.  Turn on the lights when you go into a dark area. Replace any light bulbs as soon as they burn out.  Set up your furniture so you have a clear path. Avoid moving your furniture around.  If any of your floors are uneven, fix them.  If there are any pets around you, be aware of where they are.  Review your medicines with your doctor. Some medicines can make you feel dizzy. This can increase your chance of falling. Ask your doctor what other things that you can do to help prevent falls. This information is not intended to replace advice given to you by your health care provider. Make sure you discuss any questions you have with your health care provider. Document Released: 11/05/2008 Document Revised: 06/17/2015 Document Reviewed: 02/13/2014 Elsevier Interactive Patient Education  2017 [employer].

## 2018-07-08 ENCOUNTER — Telehealth: Payer: Self-pay

## 2018-07-08 NOTE — Telephone Encounter (Signed)
Joseph Newman from [facility] called to state patient Joseph Newman BP was 90/50 and Pulse was 110-115 at rest. Patient did not complain of any pain and was able to complete his PT exercises. However, Elta Guadeloupe wanted Dr. Ancil Boozer to be notified regarding his BP and Pulse readings since they were abnormal.

## 2018-07-09 NOTE — Telephone Encounter (Signed)
Spoke with wife and he was put on Eliquis 5 mg from the hospital. He was put on Eliquis for prophylactic treatment but developed Afib see Cardio and Pharmacy consult note during hospital admission.

## 2018-07-10 NOTE — Telephone Encounter (Signed)
[facility] to give Dr. Clayborn Bigness a message about patient Eliquis and inquire if he is a good candidate to stay on this medication and if he would refill it for the patient. Also called Mrs. Llorente and left a detailed message explaining his Cardiologist would have to make this decision and we already left a message for Dr. Clayborn Bigness to reach back out to them.

## 2018-07-18 ENCOUNTER — Other Ambulatory Visit: Payer: Self-pay | Admitting: Family Medicine

## 2018-07-18 NOTE — Telephone Encounter (Signed)
Refill request for general medication. Allopurinol to [facility]   Last office visit 06/24/2018   Follow up on 10/25/2018

## 2018-07-22 ENCOUNTER — Telehealth: Payer: Self-pay | Admitting: Family Medicine

## 2018-07-22 ENCOUNTER — Other Ambulatory Visit: Payer: Self-pay | Admitting: Family Medicine

## 2018-07-22 NOTE — Telephone Encounter (Signed)
Home Health Verbal Orders - Caller/Agency: Oletta Cohn Number: [phone] Requesting OT/PT/Skilled Nursing/Social Work/Speech Therapy: OT  Frequency: 1 week 3

## 2018-07-22 NOTE — Telephone Encounter (Signed)
Hypertension medication request: Olmesartan- Amlod-HCTZ   Last office visit pertaining to hypertension: 06/24/2018   BP Readings from Last 3 Encounters:  07/05/18 (!) 92/57  06/24/18 124/69  05/24/18 112/71    Lab Results  Component Value Date   CREATININE 5.89 (H) 05/23/2018   BUN 52 (H) 05/20/2018   NA 136 05/20/2018   K 4.9 05/20/2018   CL 100 05/20/2018   CO2 25 05/20/2018     Follow up on 10/25/2018

## 2018-07-23 NOTE — Telephone Encounter (Signed)
Please see

## 2018-07-23 NOTE — Telephone Encounter (Signed)
Eliezer Lofts with [facility] and gave her verbal orders for OT over the phone per Dr. Ancil Boozer

## 2018-07-28 ENCOUNTER — Other Ambulatory Visit: Payer: Self-pay | Admitting: Family Medicine

## 2018-07-28 DIAGNOSIS — E1169 Type 2 diabetes mellitus with other specified complication: Secondary | ICD-10-CM

## 2018-07-28 DIAGNOSIS — E785 Hyperlipidemia, unspecified: Secondary | ICD-10-CM

## 2018-07-31 ENCOUNTER — Other Ambulatory Visit: Payer: Self-pay

## 2018-07-31 ENCOUNTER — Ambulatory Visit
Admission: RE | Admit: 2018-07-31 | Discharge: 2018-07-31 | Disposition: A | Discharge status: Home / Self Care | Payer: Medicare Other | Source: Ambulatory Visit | Attending: Urology | Admitting: Urology

## 2018-07-31 DIAGNOSIS — R310 Gross hematuria: Secondary | ICD-10-CM | POA: Insufficient documentation

## 2018-08-07 ENCOUNTER — Telehealth: Payer: Self-pay | Admitting: Family Medicine

## 2018-08-07 NOTE — Telephone Encounter (Signed)
Faxed to [facility] for home health wound care.

## 2018-08-07 NOTE — Telephone Encounter (Signed)
Caller name:  Elta Guadeloupe  Relation to pt:  PTA from [facility]  Call back number: [phone]   Reason for call:  Wanted to report the patient vitals from today pulse 115 BP was 92/55 and recheck 108/62 this morning.  Large golf size blister located on heal filled with fluid (PTA concerns it might turn into a ulcer) requesting home health wound care, fax orders to [phone]

## 2018-08-08 ENCOUNTER — Telehealth: Payer: Self-pay

## 2018-08-08 NOTE — Telephone Encounter (Signed)
-----   Message from Benard Halsted sent at 08/08/2018  9:12 AM EDT ----- I have spoken with the patient's wife and rescheduled his cysto app. I did not go over the results with her I told her we could discuss them at the app. I felt he may not come if we gave the results over the phone.   Sharyn Lull ----- Message ----- From: Abbie Sons, MD Sent: 08/08/2018   7:45 AM EDT To: [facility]  CT showed no significant abnormalities.  It does not look like he has been scheduled for cystoscopy as part of his hematuria evaluation.  Please schedule

## 2018-08-09 ENCOUNTER — Encounter: Payer: Self-pay | Admitting: Podiatry

## 2018-08-09 ENCOUNTER — Other Ambulatory Visit: Payer: Self-pay

## 2018-08-09 ENCOUNTER — Ambulatory Visit (INDEPENDENT_AMBULATORY_CARE_PROVIDER_SITE_OTHER): Payer: Medicare Other | Admitting: Podiatry

## 2018-08-09 VITALS — Temp 96.5°F

## 2018-08-09 DIAGNOSIS — E0842 Diabetes mellitus due to underlying condition with diabetic polyneuropathy: Secondary | ICD-10-CM

## 2018-08-09 DIAGNOSIS — B351 Tinea unguium: Secondary | ICD-10-CM

## 2018-08-09 DIAGNOSIS — M79676 Pain in unspecified toe(s): Secondary | ICD-10-CM | POA: Diagnosis not present

## 2018-08-09 DIAGNOSIS — L97522 Non-pressure chronic ulcer of other part of left foot with fat layer exposed: Secondary | ICD-10-CM

## 2018-08-09 DIAGNOSIS — L989 Disorder of the skin and subcutaneous tissue, unspecified: Secondary | ICD-10-CM

## 2018-08-09 DIAGNOSIS — I70235 Atherosclerosis of native arteries of right leg with ulceration of other part of foot: Secondary | ICD-10-CM

## 2018-08-11 NOTE — Progress Notes (Signed)
Subjective: Patient is a 73 y.o. male presenting to the office today with a chief complaint of painful callus lesions noted to the bilateral feet that have been present for the past several months. Walking and applying pressure increases the pain. He has not had any treatment.  Patient also complains of elongated, thickened nails that cause pain while ambulating in shoes. He is unable to trim his own nails. He also complains of a large blister located to the left heel that appeared several weeks ago. He reports associated drainage. Touching the area increases the pain. He has been soaking the foot in Epsom salt and applying Bacitracin ointment. Patient presents today for further treatment and evaluation.  Past Medical History:  Diagnosis Date  . Acute metabolic encephalopathy 62/69/4854   Per my chart  . Arthritis   . Chronic airway obstruction, not elsewhere classified    worked in [employer] and was exposed to [employer]  . 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 Holley Raring, nephrologist.  on dialysis tu - thur - sat  . Depressive disorder, not elsewhere classified   . Dialysis patient [facility]) 01/2018   dialysis tuesday - thursday - saturday  . Gout   . Gout   . Heart murmur    followed by dr. Clayborn Bigness  . Hyperlipidemia   . Hypertension   . Hypertrophy of prostate without urinary obstruction and other lower urinary tract symptoms (LUTS)   . Impotence of organic origin   . Microalbuminuria   . 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 Obesity, unspecified   . Plantar wart   . Sleep apnea    years ago but never used a cpap  . Synovitis and tenosynovitis, unspecified   . Type II or unspecified type diabetes mellitus without mention of complication, uncontrolled   . Unspecified venous (peripheral) insufficiency   . UTI (urinary tract infection)    frequent with ESBL    Objective:  Physical Exam General: Alert and oriented x3 in no acute  distress  Dermatology: Hyperkeratotic lesions present on the bilateral feet x 4. Pain on palpation with a central nucleated core noted. Skin is warm, dry and supple bilateral lower extremities. Negative for open lesions or macerations. Nails are tender, long, thickened and dystrophic with subungual debris, consistent with onychomycosis, 1-5 bilateral.   Wound #1 noted to the left heel measuring 7.5 x 9.0 x 0.2 cm.   To the above-noted ulceration, there is no eschar. There is a moderate amount of slough, fibrin and necrotic tissue. Granulation tissue and wound base is red. There is no malodor. There is a minimal amount of serosanginous drainage noted. Periwound integrity is intact.   Vascular: Diminished pedal pulses bilaterally. No edema or erythema noted. Capillary refill within normal limits.  Neurological: Epicritic and protective threshold diminished bilaterally.   Musculoskeletal Exam: Pain on palpation at the keratotic lesion noted. Range of motion within normal limits bilateral. Muscle strength 5/5 in all groups bilateral.     Assessment: 1. Onychodystrophic nails 1-5 bilateral with hyperkeratosis of nails.  2. Onychomycosis of nail due to dermatophyte bilateral 3. Pre-ulcerative callus lesions noted to the bilateral feet x 4 4. 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 Patient evaluated. 2. Excisional debridement of keratoic lesion using a chisel blade was performed without incident.  3. Dressed with light dressing. 4. Mechanical debridement of nails 1-5 bilaterally performed using a nail  nipper. Filed with dremel without incident.  5. Medically necessary excisional debridement including subcutaneous tissue was performed using a tissue nipper and a chisel blade. Excisional debridement of all the necrotic nonviable tissue down to healthy bleeding viable tissue was performed with post-debridement measurements same as pre-. 6. The wound was  cleansed and dry sterile dressing applied. 7. Orders placed for home health nurse dressing changes for six weeks.  8. Patient is to return to the clinic in 3 weeks.    Edrick Kins, DPM [facility]  Dr. Edrick Kins, [address]                                        [place], [place] [address]                Office [phone]  Fax [phone]

## 2018-08-12 ENCOUNTER — Telehealth: Payer: Self-pay

## 2018-08-12 DIAGNOSIS — L97522 Non-pressure chronic ulcer of other part of left foot with fat layer exposed: Secondary | ICD-10-CM

## 2018-08-12 DIAGNOSIS — E0842 Diabetes mellitus due to underlying condition with diabetic polyneuropathy: Secondary | ICD-10-CM

## 2018-08-12 NOTE — Telephone Encounter (Signed)
Order for [place] has been entered in chart and information has been emailed to Glennis Brink with [facility]

## 2018-08-12 NOTE — Telephone Encounter (Signed)
-----   Message from Edrick Kins, DPM sent at 08/09/2018  8:54 AM EDT ----- Regarding: Home health Please order home health nurse dressing changes. 3x/week x 6 weeks  - cleanse with NS - aquacel ag - 4x4 gauze, large kerlex, ace  Dx: ulcer left heel  Thanks, Dr. Amalia Hailey

## 2018-08-13 ENCOUNTER — Other Ambulatory Visit: Payer: Self-pay | Admitting: Family Medicine

## 2018-08-30 ENCOUNTER — Encounter: Payer: Self-pay | Admitting: Podiatry

## 2018-08-30 ENCOUNTER — Other Ambulatory Visit: Payer: Self-pay

## 2018-08-30 ENCOUNTER — Ambulatory Visit (INDEPENDENT_AMBULATORY_CARE_PROVIDER_SITE_OTHER): Payer: Medicare Other | Admitting: Podiatry

## 2018-08-30 VITALS — Temp 98.3°F

## 2018-08-30 DIAGNOSIS — E0842 Diabetes mellitus due to underlying condition with diabetic polyneuropathy: Secondary | ICD-10-CM | POA: Diagnosis not present

## 2018-08-30 DIAGNOSIS — L97522 Non-pressure chronic ulcer of other part of left foot with fat layer exposed: Secondary | ICD-10-CM | POA: Diagnosis not present

## 2018-08-30 MED ORDER — GABAPENTIN 100 MG PO CAPS: 100.0000 mg | ORAL_CAPSULE | Freq: Three times a day (TID) | ORAL | 3 refills | Status: AC

## 2018-08-31 ENCOUNTER — Emergency Department: Payer: Medicare Other

## 2018-08-31 ENCOUNTER — Inpatient Hospital Stay
Admission: EM | Admit: 2018-08-31 | Discharge: 2018-09-24 | DRG: 535 | Disposition: E | Payer: Medicare Other | Attending: Specialist | Admitting: Specialist

## 2018-08-31 ENCOUNTER — Encounter: Payer: Self-pay | Admitting: Emergency Medicine

## 2018-08-31 ENCOUNTER — Inpatient Hospital Stay: Payer: Medicare Other

## 2018-08-31 ENCOUNTER — Other Ambulatory Visit: Payer: Self-pay

## 2018-08-31 DIAGNOSIS — M1990 Unspecified osteoarthritis, unspecified site: Secondary | ICD-10-CM | POA: Diagnosis present

## 2018-08-31 DIAGNOSIS — A419 Sepsis, unspecified organism: Secondary | ICD-10-CM | POA: Diagnosis not present

## 2018-08-31 DIAGNOSIS — W1830XA Fall on same level, unspecified, initial encounter: Secondary | ICD-10-CM | POA: Diagnosis present

## 2018-08-31 DIAGNOSIS — Z992 Dependence on renal dialysis: Secondary | ICD-10-CM | POA: Diagnosis not present

## 2018-08-31 DIAGNOSIS — S72002A Fracture of unspecified part of neck of left femur, initial encounter for closed fracture: Secondary | ICD-10-CM | POA: Diagnosis present

## 2018-08-31 DIAGNOSIS — J9690 Respiratory failure, unspecified, unspecified whether with hypoxia or hypercapnia: Secondary | ICD-10-CM

## 2018-08-31 DIAGNOSIS — I471 Supraventricular tachycardia: Secondary | ICD-10-CM | POA: Diagnosis not present

## 2018-08-31 DIAGNOSIS — Z978 Presence of other specified devices: Secondary | ICD-10-CM

## 2018-08-31 DIAGNOSIS — R310 Gross hematuria: Secondary | ICD-10-CM | POA: Diagnosis not present

## 2018-08-31 DIAGNOSIS — Z8249 Family history of ischemic heart disease and other diseases of the circulatory system: Secondary | ICD-10-CM | POA: Diagnosis not present

## 2018-08-31 DIAGNOSIS — Y738 Miscellaneous gastroenterology and urology devices associated with adverse incidents, not elsewhere classified: Secondary | ICD-10-CM | POA: Diagnosis not present

## 2018-08-31 DIAGNOSIS — Z7901 Long term (current) use of anticoagulants: Secondary | ICD-10-CM | POA: Diagnosis not present

## 2018-08-31 DIAGNOSIS — M109 Gout, unspecified: Secondary | ICD-10-CM | POA: Diagnosis present

## 2018-08-31 DIAGNOSIS — I132 Hypertensive heart and chronic kidney disease with heart failure and with stage 5 chronic kidney disease, or end stage renal disease: Secondary | ICD-10-CM | POA: Diagnosis not present

## 2018-08-31 DIAGNOSIS — E1122 Type 2 diabetes mellitus with diabetic chronic kidney disease: Secondary | ICD-10-CM | POA: Diagnosis present

## 2018-08-31 DIAGNOSIS — J441 Chronic obstructive pulmonary disease with (acute) exacerbation: Secondary | ICD-10-CM | POA: Diagnosis present

## 2018-08-31 DIAGNOSIS — T83021A Displacement of indwelling urethral catheter, initial encounter: Secondary | ICD-10-CM | POA: Diagnosis not present

## 2018-08-31 DIAGNOSIS — R52 Pain, unspecified: Secondary | ICD-10-CM | POA: Diagnosis present

## 2018-08-31 DIAGNOSIS — I5031 Acute diastolic (congestive) heart failure: Secondary | ICD-10-CM | POA: Diagnosis not present

## 2018-08-31 DIAGNOSIS — G4730 Sleep apnea, unspecified: Secondary | ICD-10-CM | POA: Diagnosis present

## 2018-08-31 DIAGNOSIS — Y92009 Unspecified place in unspecified non-institutional (private) residence as the place of occurrence of the external cause: Secondary | ICD-10-CM

## 2018-08-31 DIAGNOSIS — R570 Cardiogenic shock: Secondary | ICD-10-CM | POA: Diagnosis not present

## 2018-08-31 DIAGNOSIS — Z7982 Long term (current) use of aspirin: Secondary | ICD-10-CM

## 2018-08-31 DIAGNOSIS — E785 Hyperlipidemia, unspecified: Secondary | ICD-10-CM | POA: Diagnosis present

## 2018-08-31 DIAGNOSIS — J9602 Acute respiratory failure with hypercapnia: Secondary | ICD-10-CM | POA: Diagnosis not present

## 2018-08-31 DIAGNOSIS — Z87891 Personal history of nicotine dependence: Secondary | ICD-10-CM | POA: Diagnosis not present

## 2018-08-31 DIAGNOSIS — J9601 Acute respiratory failure with hypoxia: Secondary | ICD-10-CM | POA: Diagnosis not present

## 2018-08-31 DIAGNOSIS — Z20828 Contact with and (suspected) exposure to other viral communicable diseases: Secondary | ICD-10-CM | POA: Diagnosis present

## 2018-08-31 DIAGNOSIS — E1140 Type 2 diabetes mellitus with diabetic neuropathy, unspecified: Secondary | ICD-10-CM | POA: Diagnosis present

## 2018-08-31 DIAGNOSIS — Z0189 Encounter for other specified special examinations: Secondary | ICD-10-CM

## 2018-08-31 DIAGNOSIS — I469 Cardiac arrest, cause unspecified: Secondary | ICD-10-CM | POA: Diagnosis not present

## 2018-08-31 DIAGNOSIS — S72012A Unspecified intracapsular fracture of left femur, initial encounter for closed fracture: Principal | ICD-10-CM | POA: Diagnosis present

## 2018-08-31 DIAGNOSIS — Z66 Do not resuscitate: Secondary | ICD-10-CM | POA: Diagnosis present

## 2018-08-31 DIAGNOSIS — R6521 Severe sepsis with septic shock: Secondary | ICD-10-CM | POA: Diagnosis not present

## 2018-08-31 DIAGNOSIS — N2581 Secondary hyperparathyroidism of renal origin: Secondary | ICD-10-CM | POA: Diagnosis present

## 2018-08-31 DIAGNOSIS — N186 End stage renal disease: Secondary | ICD-10-CM | POA: Diagnosis present

## 2018-08-31 DIAGNOSIS — I255 Ischemic cardiomyopathy: Secondary | ICD-10-CM | POA: Diagnosis present

## 2018-08-31 DIAGNOSIS — E875 Hyperkalemia: Secondary | ICD-10-CM | POA: Diagnosis not present

## 2018-08-31 DIAGNOSIS — R0600 Dyspnea, unspecified: Secondary | ICD-10-CM

## 2018-08-31 LAB — BASIC METABOLIC PANEL
Anion gap: 12 (ref 5–15)
Anion gap: 13 (ref 5–15)
BUN: 56 mg/dL — ABNORMAL HIGH (ref 8–23)
BUN: 60 mg/dL — ABNORMAL HIGH (ref 8–23)
CO2: 23 mmol/L (ref 22–32)
CO2: 24 mmol/L (ref 22–32)
Calcium: 8.4 mg/dL — ABNORMAL LOW (ref 8.9–10.3)
Calcium: 10.3 mg/dL (ref 8.9–10.3)
Chloride: 100 mmol/L (ref 98–111)
Chloride: 100 mmol/L (ref 98–111)
Creatinine, Ser: 5.98 mg/dL — ABNORMAL HIGH (ref 0.61–1.24)
Creatinine, Ser: 6.56 mg/dL — ABNORMAL HIGH (ref 0.61–1.24)
GFR calc Af Amer: 10 mL/min — ABNORMAL LOW (ref >60)
GFR calc Af Amer: 9 mL/min — ABNORMAL LOW (ref >60)
GFR calc non Af Amer: 8 mL/min — ABNORMAL LOW (ref >60)
GFR calc non Af Amer: 8 mL/min — ABNORMAL LOW (ref >60)
Glucose, Bld: 131 mg/dL — ABNORMAL HIGH (ref 70–99)
Glucose, Bld: 332 mg/dL — ABNORMAL HIGH (ref 70–99)
Potassium: 5.1 mmol/L (ref 3.5–5.1)
Potassium: 5.1 mmol/L (ref 3.5–5.1)
Sodium: 135 mmol/L (ref 135–145)
Sodium: 137 mmol/L (ref 135–145)

## 2018-08-31 LAB — CBC WITH DIFFERENTIAL/PLATELET
Abs Immature Granulocytes: 0.03 10*3/uL (ref 0.00–0.07)
Basophils Absolute: 0.1 10*3/uL (ref 0.0–0.1)
Basophils Relative: 1 %
Eosinophils Absolute: 0.4 10*3/uL (ref 0.0–0.5)
Eosinophils Relative: 5 %
HCT: 36.9 % — ABNORMAL LOW (ref 39.0–52.0)
Hemoglobin: 12.1 g/dL — ABNORMAL LOW (ref 13.0–17.0)
Immature Granulocytes: 0 %
Lymphocytes Relative: 24 %
Lymphs Abs: 2.2 10*3/uL (ref 0.7–4.0)
MCH: 29.3 pg (ref 26.0–34.0)
MCHC: 32.8 g/dL (ref 30.0–36.0)
MCV: 89.3 fL (ref 80.0–100.0)
Monocytes Absolute: 0.6 10*3/uL (ref 0.1–1.0)
Monocytes Relative: 7 %
Neutro Abs: 5.6 10*3/uL (ref 1.7–7.7)
Neutrophils Relative %: 63 %
Platelets: 204 10*3/uL (ref 150–400)
RBC: 4.13 MIL/uL — ABNORMAL LOW (ref 4.22–5.81)
RDW: 15.9 % — ABNORMAL HIGH (ref 11.5–15.5)
WBC: 8.9 10*3/uL (ref 4.0–10.5)
nRBC: 0.2 % (ref 0.0–0.2)

## 2018-08-31 LAB — COMPREHENSIVE METABOLIC PANEL
ALT: 12 U/L (ref 0–44)
AST: 18 U/L (ref 15–41)
Albumin: 3.5 g/dL (ref 3.5–5.0)
Alkaline Phosphatase: 93 U/L (ref 38–126)
Anion gap: 16 — ABNORMAL HIGH (ref 5–15)
BUN: 58 mg/dL — ABNORMAL HIGH (ref 8–23)
CO2: 19 mmol/L — ABNORMAL LOW (ref 22–32)
Calcium: 8.8 mg/dL — ABNORMAL LOW (ref 8.9–10.3)
Chloride: 100 mmol/L (ref 98–111)
Creatinine, Ser: 6.28 mg/dL — ABNORMAL HIGH (ref 0.61–1.24)
GFR calc Af Amer: 9 mL/min — ABNORMAL LOW (ref >60)
GFR calc non Af Amer: 8 mL/min — ABNORMAL LOW (ref >60)
Glucose, Bld: 180 mg/dL — ABNORMAL HIGH (ref 70–99)
Potassium: 5.8 mmol/L — ABNORMAL HIGH (ref 3.5–5.1)
Sodium: 135 mmol/L (ref 135–145)
Total Bilirubin: 1.1 mg/dL (ref 0.3–1.2)
Total Protein: 7.6 g/dL (ref 6.5–8.1)

## 2018-08-31 LAB — BLOOD GAS, ARTERIAL
Acid-base deficit: 3.8 mmol/L — ABNORMAL HIGH (ref 0.0–2.0)
Acid-base deficit: 7.3 mmol/L — ABNORMAL HIGH (ref 0.0–2.0)
Bicarbonate: 18.8 mmol/L — ABNORMAL LOW (ref 20.0–28.0)
Bicarbonate: 19.3 mmol/L — ABNORMAL LOW (ref 20.0–28.0)
FIO2: 1
FIO2: 1
MECHVT: 500 mL
O2 Saturation: 97 %
O2 Saturation: 99 %
PEEP: 10 cmH2O
Patient temperature: 37
Patient temperature: 37
RATE: 24 resp/min
pCO2 arterial: 27 mmHg — ABNORMAL LOW (ref 32.0–48.0)
pCO2 arterial: 42 mmHg (ref 32.0–48.0)
pH, Arterial: 7.45 (ref 7.350–7.450)
pH, Arterial: 7.27 — ABNORMAL LOW (ref 7.350–7.450)
pO2, Arterial: 86 mmHg (ref 83.0–108.0)
pO2, Arterial: 146 mmHg — ABNORMAL HIGH (ref 83.0–108.0)

## 2018-08-31 LAB — CBC
HCT: 41 % (ref 39.0–52.0)
Hemoglobin: 13 g/dL (ref 13.0–17.0)
MCH: 29 pg (ref 26.0–34.0)
MCHC: 31.7 g/dL (ref 30.0–36.0)
MCV: 91.5 fL (ref 80.0–100.0)
Platelets: 185 10*3/uL (ref 150–400)
RBC: 4.48 MIL/uL (ref 4.22–5.81)
RDW: 15.9 % — ABNORMAL HIGH (ref 11.5–15.5)
WBC: 3.2 10*3/uL — ABNORMAL LOW (ref 4.0–10.5)
nRBC: 0 % (ref 0.0–0.2)

## 2018-08-31 LAB — PHOSPHORUS
Phosphorus: 6.4 mg/dL — ABNORMAL HIGH (ref 2.5–4.6)
Phosphorus: 7.6 mg/dL — ABNORMAL HIGH (ref 2.5–4.6)

## 2018-08-31 LAB — TROPONIN I (HIGH SENSITIVITY): Troponin I (High Sensitivity): 25 ng/L — ABNORMAL HIGH (ref <18)

## 2018-08-31 LAB — PROTIME-INR
INR: 1.2 (ref 0.8–1.2)
Prothrombin Time: 14.6 seconds (ref 11.4–15.2)

## 2018-08-31 LAB — HEMOGLOBIN A1C
Hgb A1c MFr Bld: 6.2 % — ABNORMAL HIGH (ref 4.8–5.6)
Mean Plasma Glucose: 131.24 mg/dL

## 2018-08-31 LAB — MAGNESIUM
Magnesium: 2.1 mg/dL (ref 1.7–2.4)
Magnesium: 2.1 mg/dL (ref 1.7–2.4)

## 2018-08-31 LAB — GLUCOSE, CAPILLARY
Glucose-Capillary: 156 mg/dL — ABNORMAL HIGH (ref 70–99)
Glucose-Capillary: 131 mg/dL — ABNORMAL HIGH (ref 70–99)

## 2018-08-31 LAB — LACTIC ACID, PLASMA: Lactic Acid, Venous: 3.8 mmol/L (ref 0.5–1.9)

## 2018-08-31 LAB — APTT: aPTT: 40 seconds — ABNORMAL HIGH (ref 24–36)

## 2018-08-31 LAB — SARS CORONAVIRUS 2 BY RT PCR (HOSPITAL ORDER, PERFORMED IN ~~LOC~~ HOSPITAL LAB): SARS Coronavirus 2: NEGATIVE

## 2018-08-31 LAB — SURGICAL PCR SCREEN
MRSA, PCR: NEGATIVE
Staphylococcus aureus: POSITIVE — AB

## 2018-08-31 LAB — HEMOGLOBIN: Hemoglobin: 13.4 g/dL (ref 13.0–17.0)

## 2018-08-31 MED ORDER — ACETAMINOPHEN 650 MG RE SUPP
650.0000 mg | Freq: Four times a day (QID) | RECTAL | Status: DC | PRN
  Administered 2018-08-31: 650 mg via RECTAL
  Filled 2018-08-31: qty 1

## 2018-08-31 MED ORDER — MIDAZOLAM HCL 2 MG/2ML IJ SOLN
INTRAMUSCULAR | Status: AC
  Filled 2018-08-31: qty 2

## 2018-08-31 MED ORDER — DULAGLUTIDE 1.5 MG/0.5ML ~~LOC~~ SOAJ: 1.5000 mg | SUBCUTANEOUS | Status: DC

## 2018-08-31 MED ORDER — CEFAZOLIN SODIUM-DEXTROSE 2-4 GM/100ML-% IV SOLN
2.0000 g | INTRAVENOUS | Status: DC
  Filled 2018-08-31: qty 100

## 2018-08-31 MED ORDER — VITAMIN D 25 MCG (1000 UNIT) PO TABS: 2000.0000 [IU] | ORAL_TABLET | Freq: Every day | ORAL | Status: DC

## 2018-08-31 MED ORDER — ACETAMINOPHEN 325 MG PO TABS: 650.0000 mg | ORAL_TABLET | Freq: Four times a day (QID) | ORAL | Status: DC | PRN

## 2018-08-31 MED ORDER — ADENOSINE 12 MG/4ML IV SOLN
12.0000 mg | Freq: Once | INTRAVENOUS | Status: AC
  Administered 2018-08-31: 21:00:00 12 mg via INTRAVENOUS

## 2018-08-31 MED ORDER — VASOPRESSIN 20 UNIT/ML IV SOLN
0.0300 [IU]/min | INTRAVENOUS | Status: DC
  Administered 2018-09-01: 0.03 [IU]/min via INTRAVENOUS
  Filled 2018-08-31: qty 2

## 2018-08-31 MED ORDER — AMLODIPINE BESYLATE 10 MG PO TABS: 10.0000 mg | ORAL_TABLET | Freq: Every day | ORAL | Status: DC

## 2018-08-31 MED ORDER — ORAL CARE MOUTH RINSE
15.0000 mL | OROMUCOSAL | Status: DC
  Administered 2018-09-01 (×6): 15 mL via OROMUCOSAL

## 2018-08-31 MED ORDER — METOPROLOL TARTRATE 5 MG/5ML IV SOLN
5.0000 mg | INTRAVENOUS | Status: AC
  Administered 2018-08-31: 21:00:00 5 mg via INTRAVENOUS
  Filled 2018-08-31: qty 5

## 2018-08-31 MED ORDER — ALLOPURINOL 100 MG PO TABS
50.0000 mg | ORAL_TABLET | ORAL | Status: DC
  Filled 2018-08-31: qty 0.5

## 2018-08-31 MED ORDER — FENTANYL CITRATE (PF) 100 MCG/2ML IJ SOLN
200.0000 ug | Freq: Once | INTRAMUSCULAR | Status: AC
  Administered 2018-08-31: 200 ug via INTRAVENOUS

## 2018-08-31 MED ORDER — CHLORHEXIDINE GLUCONATE 0.12% ORAL RINSE (MEDLINE KIT)
15.0000 mL | Freq: Two times a day (BID) | OROMUCOSAL | Status: DC
  Administered 2018-08-31 – 2018-09-01 (×2): 15 mL via OROMUCOSAL

## 2018-08-31 MED ORDER — HYDROCHLOROTHIAZIDE 25 MG PO TABS: 25.0000 mg | ORAL_TABLET | Freq: Every day | ORAL | Status: DC

## 2018-08-31 MED ORDER — HYDROCORTISONE NA SUCCINATE PF 100 MG IJ SOLR
100.0000 mg | Freq: Once | INTRAMUSCULAR | Status: AC
  Administered 2018-09-01: 100 mg via INTRAVENOUS
  Filled 2018-08-31: qty 2

## 2018-08-31 MED ORDER — VECURONIUM BROMIDE 10 MG IV SOLR
INTRAVENOUS | Status: AC
  Administered 2018-08-31: 22:00:00 10 mg via INTRAVENOUS
  Filled 2018-08-31: qty 10

## 2018-08-31 MED ORDER — METOPROLOL TARTRATE 5 MG/5ML IV SOLN
INTRAVENOUS | Status: AC
  Administered 2018-08-31: 21:00:00 5 mg via INTRAVENOUS
  Filled 2018-08-31: qty 5

## 2018-08-31 MED ORDER — FAMOTIDINE IN NACL 20-0.9 MG/50ML-% IV SOLN
20.0000 mg | Freq: Two times a day (BID) | INTRAVENOUS | Status: DC
  Administered 2018-08-31: 20 mg via INTRAVENOUS
  Filled 2018-08-31: qty 50

## 2018-08-31 MED ORDER — GABAPENTIN 100 MG PO CAPS: 100.0000 mg | ORAL_CAPSULE | Freq: Three times a day (TID) | ORAL | Status: DC

## 2018-08-31 MED ORDER — CALCITRIOL 0.25 MCG PO CAPS
0.2500 ug | ORAL_CAPSULE | Freq: Every day | ORAL | Status: DC
  Filled 2018-08-31 (×2): qty 1

## 2018-08-31 MED ORDER — ONDANSETRON HCL 4 MG/2ML IJ SOLN: 4.0000 mg | Freq: Four times a day (QID) | INTRAMUSCULAR | Status: DC | PRN

## 2018-08-31 MED ORDER — ADENOSINE 12 MG/4ML IV SOLN
INTRAVENOUS | Status: AC
  Administered 2018-08-31: 6 mg via INTRAVENOUS
  Filled 2018-08-31: qty 4

## 2018-08-31 MED ORDER — ONDANSETRON HCL 4 MG PO TABS: 4.0000 mg | ORAL_TABLET | Freq: Four times a day (QID) | ORAL | Status: DC | PRN

## 2018-08-31 MED ORDER — MORPHINE SULFATE (PF) 2 MG/ML IV SOLN
2.0000 mg | INTRAVENOUS | Status: AC
  Administered 2018-08-31: 2 mg via INTRAVENOUS

## 2018-08-31 MED ORDER — HYDROCODONE-ACETAMINOPHEN 5-325 MG PO TABS
1.0000 | ORAL_TABLET | ORAL | Status: DC | PRN
  Administered 2018-08-31: 19:00:00 1 via ORAL
  Filled 2018-08-31: qty 1

## 2018-08-31 MED ORDER — SODIUM BICARBONATE 8.4 % IV SOLN
INTRAVENOUS | Status: AC
  Administered 2018-08-31: via INTRAVENOUS
  Filled 2018-08-31: qty 150

## 2018-08-31 MED ORDER — TAMSULOSIN HCL 0.4 MG PO CAPS: 0.4000 mg | ORAL_CAPSULE | Freq: Every day | ORAL | Status: DC

## 2018-08-31 MED ORDER — ALBUTEROL SULFATE (2.5 MG/3ML) 0.083% IN NEBU
INHALATION_SOLUTION | RESPIRATORY_TRACT | Status: AC
  Administered 2018-08-31: 23:00:00
  Filled 2018-08-31: qty 6

## 2018-08-31 MED ORDER — SEVELAMER CARBONATE 800 MG PO TABS
800.0000 mg | ORAL_TABLET | Freq: Three times a day (TID) | ORAL | Status: DC
  Administered 2018-09-01: 800 mg via ORAL
  Filled 2018-08-31 (×2): qty 1

## 2018-08-31 MED ORDER — ORAL CARE MOUTH RINSE: 15.0000 mL | OROMUCOSAL | Status: DC

## 2018-08-31 MED ORDER — ADENOSINE 12 MG/4ML IV SOLN
6.0000 mg | Freq: Once | INTRAVENOUS | Status: AC
  Administered 2018-08-31: 21:00:00 6 mg via INTRAVENOUS

## 2018-08-31 MED ORDER — INSULIN ASPART 100 UNIT/ML ~~LOC~~ SOLN
SUBCUTANEOUS | Status: AC
  Administered 2018-08-31: 8 [IU]
  Filled 2018-08-31: qty 1

## 2018-08-31 MED ORDER — HYDROCORTISONE NA SUCCINATE PF 100 MG IJ SOLR
50.0000 mg | Freq: Four times a day (QID) | INTRAMUSCULAR | Status: DC
  Administered 2018-09-01: 50 mg via INTRAVENOUS
  Filled 2018-08-31: qty 2

## 2018-08-31 MED ORDER — FENTANYL CITRATE (PF) 100 MCG/2ML IJ SOLN
INTRAMUSCULAR | Status: AC
  Administered 2018-08-31: 21:00:00 200 ug via INTRAVENOUS
  Filled 2018-08-31: qty 4

## 2018-08-31 MED ORDER — OLMESARTAN-AMLODIPINE-HCTZ 40-10-25 MG PO TABS: 1.0000 | ORAL_TABLET | Freq: Every day | ORAL | Status: DC

## 2018-08-31 MED ORDER — INSULIN ASPART 100 UNIT/ML ~~LOC~~ SOLN: 0.0000 [IU] | Freq: Three times a day (TID) | SUBCUTANEOUS | Status: DC

## 2018-08-31 MED ORDER — FENTANYL CITRATE (PF) 100 MCG/2ML IJ SOLN
25.0000 ug | Freq: Once | INTRAMUSCULAR | Status: AC
  Administered 2018-08-31: 25 ug via INTRAVENOUS

## 2018-08-31 MED ORDER — VECURONIUM BROMIDE 10 MG IV SOLR
10.0000 mg | Freq: Once | INTRAVENOUS | Status: AC
  Administered 2018-08-31: 21:00:00 10 mg via INTRAVENOUS

## 2018-08-31 MED ORDER — CHLORHEXIDINE GLUCONATE 0.12% ORAL RINSE (MEDLINE KIT): 15.0000 mL | Freq: Two times a day (BID) | OROMUCOSAL | Status: DC

## 2018-08-31 MED ORDER — INSULIN ASPART 100 UNIT/ML ~~LOC~~ SOLN: 0.0000 [IU] | Freq: Every day | SUBCUTANEOUS | Status: DC

## 2018-08-31 MED ORDER — FENTANYL 2500MCG IN NS 250ML (10MCG/ML) PREMIX INFUSION
25.0000 ug/h | INTRAVENOUS | Status: DC
  Administered 2018-08-31: 50 ug/h via INTRAVENOUS
  Filled 2018-08-31: qty 250

## 2018-08-31 MED ORDER — FENTANYL BOLUS VIA INFUSION
25.0000 ug | INTRAVENOUS | Status: DC | PRN
  Administered 2018-08-31: 25 ug via INTRAVENOUS
  Filled 2018-08-31: qty 25

## 2018-08-31 MED ORDER — CHLORHEXIDINE GLUCONATE CLOTH 2 % EX PADS
6.0000 | MEDICATED_PAD | Freq: Every day | CUTANEOUS | Status: DC
  Administered 2018-09-01: 06:00:00 6 via TOPICAL

## 2018-08-31 MED ORDER — IRBESARTAN 150 MG PO TABS: 300.0000 mg | ORAL_TABLET | Freq: Every day | ORAL | Status: DC

## 2018-08-31 MED ORDER — VECURONIUM BROMIDE 10 MG IV SOLR
INTRAVENOUS | Status: AC
  Administered 2018-08-31: 10 mg via INTRAVENOUS
  Filled 2018-08-31: qty 10

## 2018-08-31 MED ORDER — MIDAZOLAM HCL 2 MG/2ML IJ SOLN
4.0000 mg | Freq: Once | INTRAMUSCULAR | Status: AC
  Administered 2018-08-31: 4 mg via INTRAVENOUS

## 2018-08-31 MED ORDER — MORPHINE SULFATE (PF) 2 MG/ML IV SOLN
INTRAVENOUS | Status: AC
  Administered 2018-08-31: 2 mg via INTRAVENOUS
  Filled 2018-08-31: qty 1

## 2018-08-31 MED ORDER — NOREPINEPHRINE 4 MG/250ML-% IV SOLN
INTRAVENOUS | Status: AC
  Administered 2018-09-01: 01:00:00 35 ug/min via INTRAVENOUS
  Filled 2018-08-31: qty 250

## 2018-08-31 MED ORDER — SODIUM CHLORIDE 0.9 % IV SOLN
INTRAVENOUS | Status: DC
  Administered 2018-08-31: 17:00:00 via INTRAVENOUS

## 2018-08-31 MED ORDER — CARVEDILOL 6.25 MG PO TABS: 6.2500 mg | ORAL_TABLET | Freq: Two times a day (BID) | ORAL | Status: DC

## 2018-08-31 MED ORDER — STERILE WATER FOR INJECTION IJ SOLN
INTRAMUSCULAR | Status: AC
  Administered 2018-08-31: 21:00:00 10 mL
  Filled 2018-08-31: qty 10

## 2018-08-31 MED ORDER — MIDAZOLAM HCL 2 MG/2ML IJ SOLN: 1.0000 mg | INTRAMUSCULAR | Status: DC | PRN

## 2018-08-31 MED ORDER — INSULIN ASPART 100 UNIT/ML ~~LOC~~ SOLN
0.0000 [IU] | SUBCUTANEOUS | Status: DC
  Administered 2018-09-01: 2 [IU] via SUBCUTANEOUS
  Filled 2018-08-31: qty 1

## 2018-08-31 MED ORDER — DOCUSATE SODIUM 100 MG PO CAPS: 100.0000 mg | ORAL_CAPSULE | Freq: Every day | ORAL | Status: DC

## 2018-08-31 MED ORDER — STERILE WATER FOR INJECTION IJ SOLN
INTRAMUSCULAR | Status: AC
  Administered 2018-08-31: 22:00:00 10 mL
  Filled 2018-08-31: qty 10

## 2018-08-31 MED ORDER — VECURONIUM BROMIDE 10 MG IV SOLR
10.0000 mg | Freq: Once | INTRAVENOUS | Status: AC
  Administered 2018-08-31: 22:00:00 10 mg via INTRAVENOUS

## 2018-08-31 MED ORDER — ATORVASTATIN CALCIUM 20 MG PO TABS: 40.0000 mg | ORAL_TABLET | Freq: Every day | ORAL | Status: DC

## 2018-08-31 MED ORDER — NOREPINEPHRINE 4 MG/250ML-% IV SOLN
0.0000 ug/min | INTRAVENOUS | Status: DC
  Administered 2018-08-31: 23:00:00 15 ug/min via INTRAVENOUS
  Administered 2018-09-01: 01:00:00 35 ug/min via INTRAVENOUS
  Administered 2018-09-01 (×2): 40 ug/min via INTRAVENOUS
  Filled 2018-08-31 (×3): qty 250

## 2018-08-31 NOTE — Progress Notes (Signed)
Pt with increase tachypnea and grunting. Vital signs were obtained HR 214. Oxygen saturation 77 on 2L of oxygen. Pt place on Non re breather oxygen saturation up to 95%.  [facility] called and responded. Dr. Urbano Heir notified. Dr. Jannifer Franklin came to bedside. MD order to transfer pt to the critical care unit.

## 2018-08-31 NOTE — Progress Notes (Signed)
Dr Tressia Miners saw patient, will consult urology to come see patient

## 2018-08-31 NOTE — Progress Notes (Signed)
[facility] notified that pt has been transferred to the ICU

## 2018-08-31 NOTE — Consult Note (Signed)
Name: Marwan Kissell MRN: [id number] DOB: 10/03/1945     CONSULTATION DATE: 08/31/2018  REFERRING MD :  Wynelle Bourgeois  CHIEF COMPLAINT:  resp failure    HISTORY OF PRESENT ILLNESS:   73 year old male with known end-stage renal disease on dialysis, admitted for fall and left femoral neck fracture went to the floor.  Rapid response called later this afternoon for hematuria. A Foley catheter was placed on the floor and patient had pulled it out causing significant urethral trauma resulting in bleeding with blood clots were passed through the penis and fresh blood was oozing.  Stat hemoglobin is stable at 13.  An hour later, another rapid response was called as patient's heart rate was greater than 200s. As patient tachypneic, with RR in 30-40 range, HR persistently remaining >200- patient transferred to ICU Started  On Bipap for pulm edema noted on CXR.  Patient failed biPAP and was emergently intubated    PAST MEDICAL HISTORY :   has a past medical history of Acute metabolic encephalopathy (99991111), Arthritis, Chronic airway obstruction, not elsewhere classified, CKD (chronic kidney disease) stage 2, GFR 60-89 ml/min (05/03/2017), Depressive disorder, not elsewhere classified, Dialysis patient ([place]) (01/2018), Gout, Gout, Heart murmur, Hyperlipidemia, Hypertension, Hypertrophy of prostate without urinary obstruction and other lower urinary tract symptoms (LUTS), Impotence of organic origin, Microalbuminuria, Nodular prostate without urinary obstruction, Obesity, unspecified, Plantar wart, Sleep apnea, Synovitis and tenosynovitis, unspecified, Type II or unspecified type diabetes mellitus without mention of complication, uncontrolled, Unspecified venous (peripheral) insufficiency, and UTI (urinary tract infection).  has a past surgical history that includes foot surgery (Right); DIALYSIS/PERMA CATHETER INSERTION (N/A, 09/28/2017); DIALYSIS/PERMA CATHETER INSERTION; AV fistula placement (Right,  03/08/2018); Removal of a dialysis catheter (N/A, 03/08/2018); and Hip pinning, cannulated (Right, 05/16/2018). Prior to Admission medications   Medication Sig Start Date End Date Taking? Authorizing Provider  allopurinol (ZYLOPRIM) 100 MG tablet TAKE 0.5 TABLETS (50 MG TOTAL) BY MOUTH EVERY OTHER DAY. 07/18/18 10/16/18 Yes Sowles, Drue Stager, MD  aspirin EC 81 MG tablet Take 81 mg by mouth daily.   Yes [provider]  atorvastatin (LIPITOR) 40 MG tablet TAKE 1 TABLET BY MOUTH EVERY DAY Patient taking differently: Take 40 mg by mouth at bedtime.  08/13/18  Yes Sowles, Drue Stager, MD  calcitRIOL (ROCALTROL) 0.25 MCG capsule Take 1 capsule (0.25 mcg total) by mouth daily. Patient taking differently: Take 0.25 mcg by mouth at bedtime.  06/24/18  Yes Sowles, Drue Stager, MD  carvedilol (COREG) 6.25 MG tablet Take 6.25 mg by mouth 2 (two) times daily. 06/14/18  Yes [provider]  cholecalciferol (VITAMIN D3) 25 MCG (1000 UT) tablet Take 1,000 Units by mouth 2 (two) times daily.    Yes [provider]  Cyanocobalamin (VITAMIN B-12) 1000 MCG SUBL Take 1 tablet by mouth daily.   Yes [provider]  docusate sodium (COLACE) 100 MG capsule Take 100 mg by mouth at bedtime.    Yes [provider]  Multiple Vitamin (MULTIVITAMINS PO) Take 1 tablet by mouth daily.   Yes [provider]  Olmesartan-amLODIPine-HCTZ 40-10-25 MG TABS TAKE 1 TABLET BY MOUTH EVERY DAY Patient taking differently: Take 1 tablet by mouth daily.  07/22/18  Yes Sowles, Drue Stager, MD  sevelamer carbonate (RENVELA) 800 MG tablet Take 1,600 mg by mouth 3 (three) times daily.  05/14/18  Yes [provider]  tamsulosin (FLOMAX) 0.4 MG CAPS capsule TAKE 1 CAPSULE (0.4 MG TOTAL) BY MOUTH DAILY AFTER BREAKFAST. Patient taking differently: Take 0.4 mg by mouth at  bedtime.  04/09/18  Yes Sowles, Drue Stager, MD  TRULICITY 1.5 0000000 SOPN INJECT 1.5 MG INTO THE SKIN SEE ADMIN INSTRUCTIONS. INJECTS ON  SUNDAYS Patient taking differently: Inject 1.5 mg into the skin every Sunday.  07/29/18  Yes Sowles, Drue Stager, MD  apixaban (ELIQUIS) 5 MG TABS tablet Take 1 tablet (5 mg total) by mouth 2 (two) times daily. Patient not taking: Reported on 09/15/2018 05/24/18   Vaughan Basta, MD  gabapentin (NEURONTIN) 100 MG capsule Take 1 capsule (100 mg total) by mouth 3 (three) times daily. 08/30/18   Edrick Kins, DPM   No Known Allergies  FAMILY HISTORY:  family history includes Hypertension in his mother. SOCIAL HISTORY:  reports that he quit smoking about 13 months ago. His smoking use included cigarettes and cigars. He has a 7.50 pack-year smoking history. He has never used smokeless tobacco. He reports previous alcohol use. He reports that he does not use drugs.  REVIEW OF SYSTEMS:   Unable to obtain due to critical illness   VITAL SIGNS: Temp:  [97.9 F (36.6 C)-99.3 F (37.4 C)] 99.3 F (37.4 C) (08/08 1945) Pulse Rate:  [101-214] 210 (08/08 2004) Resp:  [15-30] 24 (08/08 1945) BP: (120-164)/(61-128) 131/89 (08/08 2004) SpO2:  [77 %-97 %] 95 % (08/08 1945) Weight:  [97.8 kg] 97.8 kg (08/08 0948)   I/O last 3 completed shifts: In: 11.6 [I.V.:11.6] Out: -  Total I/O In: -  Out: 1 [Emesis/NG output:1]   SpO2: 95 % O2 Flow Rate (L/min): 2 L/min   Physical Examination:  GENERAL:critically ill appearing, +resp distress HEAD: Normocephalic, atraumatic.  EYES: Pupils equal, round, reactive to light.  No scleral icterus.  MOUTH: Moist mucosal membrane. NECK: Supple. No JVD.  PULMONARY: +rhonchi, +wheezing CARDIOVASCULAR: S1 and S2. Regular rate and rhythm. No murmurs, rubs, or gallops.  GASTROINTESTINAL: Soft, nontender, -distended. No masses. Positive bowel sounds. No hepatosplenomegaly.  MUSCULOSKELETAL: No swelling, clubbing, or edema.  NEUROLOGIC: obtunded SKIN:intact,warm,dry  I personally reviewed lab work that was obtained in last 24 hrs. CXR Independently  reviewed-b/l infiltrates  MEDICATIONS: I have reviewed all medications and confirmed regimen as documented   CULTURE RESULTS   Recent Results (from the past 240 hour(s))  SARS Coronavirus 2 [facility] order, Performed in [facility] hospital lab) Nasopharyngeal Nasopharyngeal Swab     Status: None   Collection Time: 08/26/2018  3:28 PM   Specimen: Nasopharyngeal Swab  Result Value Ref Range Status   SARS Coronavirus 2 NEGATIVE NEGATIVE Final    Comment: (NOTE) If result is NEGATIVE SARS-CoV-2 target nucleic acids are NOT DETECTED. The SARS-CoV-2 RNA is generally detectable in upper and lower  respiratory specimens during the acute phase of infection. The lowest  concentration of SARS-CoV-2 viral copies this assay can detect is 250  copies / mL. A negative result does not preclude SARS-CoV-2 infection  and should not be used as the sole basis for treatment or other  patient management decisions.  A negative result may occur with  improper specimen collection / handling, submission of specimen other  than nasopharyngeal swab, presence of viral mutation(s) within the  areas targeted by this assay, and inadequate number of viral copies  (<250 copies / mL). A negative result must be combined with clinical  observations, patient history, and epidemiological information. If result is POSITIVE SARS-CoV-2 target nucleic acids are DETECTED. The SARS-CoV-2 RNA is generally detectable in upper and lower  respiratory specimens dur ing the acute phase of infection.  Positive  results are indicative  of active infection with SARS-CoV-2.  Clinical  correlation with patient history and other diagnostic information is  necessary to determine patient infection status.  Positive results do  not rule out bacterial infection or co-infection with other viruses. If result is PRESUMPTIVE POSTIVE SARS-CoV-2 nucleic acids MAY BE PRESENT.   A presumptive positive result was obtained on the submitted specimen   and confirmed on repeat testing.  While 2019 novel coronavirus  (SARS-CoV-2) nucleic acids may be present in the submitted sample  additional confirmatory testing may be necessary for epidemiological  and / or clinical management purposes  to differentiate between  SARS-CoV-2 and other Sarbecovirus currently known to infect humans.  If clinically indicated additional testing with an alternate test  methodology [id number]) is advised. The SARS-CoV-2 RNA is generally  detectable in upper and lower respiratory sp ecimens during the acute  phase of infection. The expected result is Negative. Fact Sheet for Patients:  StrictlyIdeas.no Fact Sheet for Healthcare Providers: BankingDealers.co.za This test is not yet approved or cleared by the [place] FDA and has been authorized for detection and/or diagnosis of SARS-CoV-2 by FDA under an Emergency Use Authorization (EUA).  This EUA will remain in effect (meaning this test can be used) for the duration of the COVID-19 declaration under Section 564(b)(1) of the Act, 21 U.S.C. section 360bbb-3(b)(1), unless the authorization is terminated or revoked sooner. Performed at [facility], [address] [place], [place] [address]   Surgical PCR screen     Status: Abnormal   Collection Time: 08/29/2018  5:45 PM   Specimen: Nasal Mucosa; Nasal Swab  Result Value Ref Range Status   MRSA, PCR NEGATIVE NEGATIVE Final   Staphylococcus aureus POSITIVE (A) NEGATIVE Final    Comment: (NOTE) The Xpert SA Assay (FDA approved for NASAL specimens in patients 33 years of age and older), is one component of a comprehensive surveillance program. It is not intended to diagnose infection nor to guide or monitor treatment. Performed at [facility], [address] [place], [place] [address]           IMAGING    Dg Pelvis 1-2 Views  Result Date: 09/10/2018 CLINICAL DATA:  Fall  yesterday. Inability to stand or bear weight. EXAM: PELVIS - 1-2 VIEW COMPARISON:  Pelvic and right hip radiographs 05/15/2018. Pelvic CT 07/31/2018. FINDINGS: The bones are mildly demineralized. Previous right femoral neck pinning with intact hardware. No evidence of acute fracture or dislocation. The sacroiliac joints and symphysis pubis are intact IMPRESSION: No evidence of acute pelvic fracture. Previous right femoral neck pinning. Electronically Signed   By: Richardean Sale M.D.   On: 09/16/2018 12:27   Dg Tibia/fibula Left  Result Date: 09/06/2018 CLINICAL DATA:  Fall yesterday. Inability to stand or bear weight. EXAM: LEFT TIBIA AND FIBULA - 2 VIEW COMPARISON:  None. FINDINGS: The bones are demineralized. No evidence of acute fracture or dislocation. Possible mild soft tissue swelling medially in the ankle. No foreign body. IMPRESSION: No evidence of acute left lower leg injury. Electronically Signed   By: Richardean Sale M.D.   On: 09/10/2018 12:25   Ct Hip Left Wo Contrast  Result Date: 09/08/2018 CLINICAL DATA:  Hip pain since falling yesterday. EXAM: CT OF THE LEFT HIP WITHOUT CONTRAST TECHNIQUE: Multidetector CT imaging of the left hip was performed according to the standard protocol. Multiplanar CT image reconstructions were also generated. COMPARISON:  Radiographs 08/24/2018. pelvic CT 07/31/2018. FINDINGS: Bones/Joint/Cartilage The bones appear demineralized. There is a minimally  impacted and minimally displaced subcapital fracture of the left femoral neck. The femoral head is intact and located. No evidence of left pelvic fracture or significant left hip arthropathy. There is a small left hip joint effusion. Ligaments Suboptimally assessed by CT. Muscles and Tendons The left hip muscles and tendons appear intact. There is a probable trochanteric bursal fluid collection lateral to the greater trochanter, measuring 5.6 cm on coronal image 55/6, similar to previous CT. No focal hematoma. Soft  tissues There is a left inguinal hernia containing fat and moderate enlargement of the prostate gland. No evidence of pelvic hematoma. IMPRESSION: 1. Acute, minimally impacted and minimally displaced subcapital fracture of the left femoral neck. 2. No evidence of left pelvic fracture or significant left hip arthropathy. Electronically Signed   By: Richardean Sale M.D.   On: 09/21/2018 14:12   Dg Chest Port 1 View  Result Date: 08/26/2018 CLINICAL DATA:  Tachycardia.  Low O2 sats. EXAM: PORTABLE CHEST 1 VIEW COMPARISON:  August 31, 2018 FINDINGS: Cardiomegaly and mild edema.  No other abnormalities. IMPRESSION: Cardiomegaly.  New mild pulmonary edema. Electronically Signed   By: Dorise Bullion III M.D   On: 08/26/2018 20:27   Dg Chest Portable 1 View  Result Date: 08/30/2018 CLINICAL DATA:  73 year old male with a history of fall and weakness EXAM: PORTABLE CHEST 1 VIEW COMPARISON:  May 20, 2018 FINDINGS: Cardiomediastinal silhouette unchanged in size and contour with cardiomegaly. Calcifications of the aortic arch. Reticular opacities throughout the lungs with improved aeration compared to the prior. No pleural effusion or pneumothorax. No confluent airspace disease. No visualized displaced fracture IMPRESSION: Improved aeration compared to the prior plain film with persisting reticular pattern of opacity most likely representing chronic interstitial lung disease. Electronically Signed   By: Corrie Mckusick D.O.   On: 08/24/2018 12:26   Dg Femur Min 2 Views Left  Result Date: 09/03/2018 CLINICAL DATA:  Fall yesterday.  Inability to stand or bear weight. EXAM: LEFT FEMUR 2 VIEWS COMPARISON:  Pelvic and right hip radiographs 05/14/2008 FINDINGS: The bones are mildly demineralized. There is no evidence of acute fracture or dislocation. There are mild tricompartmental degenerative changes at the left knee. Mild vascular calcifications are noted. IMPRESSION: No evidence of acute left femoral injury.  Electronically Signed   By: Richardean Sale M.D.   On: 08/24/2018 12:24      Ventilator continued, requirement due to severe respiratory failure   Ventilator Sedation RASS 0 to -2      ASSESSMENT AND PLAN SYNOPSIS  73 yo AAM admitted to ICU for acute resp failure with SVT in setting of ESRD on HD with gross hematuria from traumatic foley removal by patient with acute diastolic heart failure  Severe ACUTE Hypoxic and Hypercapnic Respiratory Failure -continue Full MV support -continue Bronchodilator Therapy -Wean Fio2 and PEEP as tolerated fio2 100% PEEP 10   ACUTE DIASTOLIC CARDIAC FAILURE-  Vent support   SEVERE COPD EXACERBATION -continue IV steroids as prescribed -continue NEB THERAPY as prescribed -morphine as needed -wean fio2 as needed and tolerated   ESRD Renal Failure -follow chem 7 -follow UO HD as needed  NEUROLOGY - intubated and sedated - minimal sedation to achieve a RASS goal: -1   CARDIAC ICU monitoring  ID -continue IV abx as prescibed -follow up cultures  GI GI PROPHYLAXIS as indicated  NUTRITIONAL STATUS DIET-->TF's as tolerated Constipation protocol as indicated   ENDO - will use ICU hypoglycemic\Hyperglycemia protocol if needed    ELECTROLYTES -follow labs  as needed -replace as needed -pharmacy consultation and following   DVT/GI PRX ordered TRANSFUSIONS AS NEEDED MONITOR FSBS ASSESS the need for LABS    Critical Care Time devoted to patient care services described in this note is 45 minutes.   Overall, patient is critically ill, prognosis is guarded.  Patient with Multiorgan failure and at high risk for cardiac arrest and death.     Corrin Parker, M.D.  [facility]  Medical Director [place] Director [facility] Cardio-Pulmonary Department

## 2018-08-31 NOTE — Progress Notes (Signed)
Rapid Response call HR 212 pt grunting. Low oxygen low at 72. Non rebreather applied.  Prime doc contacted.

## 2018-08-31 NOTE — ED Provider Notes (Signed)
[facility] Emergency Department Provider Note ____________________________________________   First MD Initiated Contact with Patient 09/12/2018 1046     (approximate)  I have reviewed the triage vital signs and the nursing notes.   HISTORY  Chief Complaint Fall    HPI Joseph Newman is a 73 y.o. male with PMH as noted below who presents with left leg pain after a fall yesterday.  The patient states that he fell from standing height and did not hit his head.  He denies pain in other parts of his body.  The wife states that he was able to walk with assistance yesterday but then today was unable to get out of bed.  Patient states he does feel slightly weak but denies other acute complaints.   Past Medical History:  Diagnosis Date  . Acute metabolic encephalopathy [id number]   Per my chart  . Arthritis   . Chronic airway obstruction, not elsewhere classified    worked in [employer] and was exposed to [employer]  . 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 Holley Raring, nephrologist.  on dialysis tu - thur - sat  . Depressive disorder, not elsewhere classified   . Dialysis patient [facility]) 01/2018   dialysis tuesday - thursday - saturday  . Gout   . Gout   . Heart murmur    followed by dr. Clayborn Bigness  . Hyperlipidemia   . Hypertension   . Hypertrophy of prostate without urinary obstruction and other lower urinary tract symptoms (LUTS)   . Impotence of organic origin   . Microalbuminuria   . 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 Obesity, unspecified   . Plantar wart   . Sleep apnea    years ago but never used a cpap  . Synovitis and tenosynovitis, unspecified   . Type II or unspecified type diabetes mellitus without mention of complication, uncontrolled   . Unspecified venous (peripheral) insufficiency   . UTI (urinary tract infection)    frequent with ESBL    Patient Active Problem List   Diagnosis Date Noted  .  Anemia 07/05/2018  . Hyperlipidemia 07/05/2018  . Malnutrition due to renal disease ([place]) 06/24/2018  . Ulcer of right foot with fat layer exposed ([place]) 06/24/2018  . Chronic diastolic congestive heart failure ([place]) 06/24/2018  . Mild major depression ([place]) 06/24/2018  . Hyperparathyroidism, secondary renal ([place]) 06/24/2018  . Pressure injury of skin 05/23/2018  . Closed right hip fracture ([place]) 05/15/2018  . 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 ([place]) 01/30/2018  . End stage renal disease ([place]) 12/31/2017  . Chronic gouty arthritis 10/22/2017  . Hyperlipidemia associated with type 2 diabetes mellitus ([place]) 09/06/2017  . Chronic kidney disease (CKD), stage V ([place]) 07/26/2017  . Dyslipidemia associated with type 2 diabetes mellitus ([place]) 03/13/2016  . Diabetic ulcer of toe of right foot associated with type 2 diabetes mellitus, limited to breakdown of skin ([place]) 03/07/2016  . Type 2 diabetes mellitus with diabetic nephropathy, without long-term current use of insulin ([place]) 06/17/2015  . Microscopic hematuria 05/30/2015  . 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 Dyslipidemia 08/31/2014  . Diabetes mellitus with renal manifestation ([place]) 08/31/2014  . COPD (chronic obstructive pulmonary disease) ([place]) 08/31/2014  . Microalbuminuria 08/31/2014  . ED (erectile dysfunction) 08/31/2014  . Hypertension     Past Surgical History:  Procedure Laterality Date  . AV FISTULA PLACEMENT Right 03/08/2018   Procedure: ARTERIOVENOUS (AV) FISTULA CREATION;  Surgeon: Delana Meyer,  Dolores Lory, MD;  Location: [facility] ORS;  Service: Vascular;  Laterality: Right;  . DIALYSIS/PERMA CATHETER INSERTION N/A 09/28/2017   Procedure: DIALYSIS/PERMA CATHETER INSERTION;  Surgeon: Katha Cabal, MD;  Location: [place] [facility];  Service: Cardiovascular;  Laterality: N/A;  . DIALYSIS/PERMA CATHETER INSERTION     peritoneal catheter  . foot surgery Right    repair of calus  . HIP  PINNING,CANNULATED Right 05/16/2018   Procedure: CANNULATED HIP PINNING;  Surgeon: Hessie Knows, MD;  Location: [facility] ORS;  Service: Orthopedics;  Laterality: Right;  . REMOVAL OF A DIALYSIS CATHETER N/A 03/08/2018   Procedure: REMOVAL OF A DIALYSIS CATHETER;  Surgeon: Katha Cabal, MD;  Location: [facility] ORS;  Service: Vascular;  Laterality: N/A;    Prior to Admission medications   Medication Sig Start Date End Date Taking? Authorizing Provider  allopurinol (ZYLOPRIM) 100 MG tablet TAKE 0.5 TABLETS (50 MG TOTAL) BY MOUTH EVERY OTHER DAY. 07/18/18 10/16/18  Steele Sizer, MD  apixaban (ELIQUIS) 5 MG TABS tablet Take 1 tablet (5 mg total) by mouth 2 (two) times daily. 05/24/18   Vaughan Basta, MD  aspirin EC 81 MG tablet Take 81 mg by mouth daily.    [provider]  atorvastatin (LIPITOR) 40 MG tablet TAKE 1 TABLET BY MOUTH EVERY DAY 08/13/18   Ancil Boozer, Drue Stager, MD  calcitRIOL (ROCALTROL) 0.25 MCG capsule Take 1 capsule (0.25 mcg total) by mouth daily. 06/24/18   Steele Sizer, MD  carvedilol (COREG) 6.25 MG tablet Take 6.25 mg by mouth 2 (two) times daily. 06/14/18   [provider]  cholecalciferol (VITAMIN D3) 25 MCG (1000 UT) tablet Take 2 capsules by mouth daily.     [provider]  Cyanocobalamin (VITAMIN B-12) 1000 MCG SUBL Take 1 tablet by mouth daily.    [provider]  docusate sodium (COLACE) 100 MG capsule Take 100 mg by mouth daily.    [provider]  gabapentin (NEURONTIN) 100 MG capsule Take 1 capsule (100 mg total) by mouth 3 (three) times daily. 08/30/18   Edrick Kins, DPM  lidocaine-prilocaine (EMLA) cream  04/09/18   [provider]  Multiple Vitamin (MULTIVITAMINS PO) Take 1 tablet by mouth daily.    [provider]  Olmesartan-amLODIPine-HCTZ 40-10-25 MG TABS TAKE 1 TABLET BY MOUTH EVERY DAY 07/22/18   Ancil Boozer, Drue Stager, MD  sevelamer carbonate (RENVELA) 800 MG tablet TAKE 2 TABLETS BY MOUTH THREE TIMES A  DAY WITH MEALS 05/14/18   [provider]  tamsulosin (FLOMAX) 0.4 MG CAPS capsule TAKE 1 CAPSULE (0.4 MG TOTAL) BY MOUTH DAILY AFTER BREAKFAST. 04/09/18   Sowles, Drue Stager, MD  TRULICITY 1.5 0000000 SOPN INJECT 1.5 MG INTO THE SKIN SEE ADMIN INSTRUCTIONS. INJECTS ON SUNDAYS 07/29/18   Steele Sizer, MD    Allergies Patient has no known allergies.  Family History  Problem Relation Age of Onset  . Hypertension Mother   . Kidney disease Neg Hx   . Prostate cancer Neg Hx     Social History Social History   Tobacco Use  . Smoking status: Former Smoker    Packs/day: 0.25    Years: 30.00    Pack years: 7.50    Types: Cigarettes, Cigars    Quit date: 07/23/2017    Years since quitting: 1.1  . Smokeless tobacco: Never Used  Substance Use Topics  . Alcohol use: Not Currently    Alcohol/week: 0.0 standard drinks  . Drug use: No    Review of Systems  Constitutional:  No fever/chills.  Positive for fatigue. Eyes: No redness. ENT: No sore throat. Cardiovascular: Denies chest pain. Respiratory: Denies shortness of breath. Gastrointestinal: No vomiting or diarrhea.  Genitourinary: Negative for dysuria.  Musculoskeletal: Negative for back pain.  Positive for left leg pain. Skin: Negative for rash. Neurological: Negative for focal weakness or numbness.   ____________________________________________   PHYSICAL EXAM:  VITAL SIGNS: ED Triage Vitals  Enc Vitals Group     BP 09/04/2018 0946 120/61     Pulse Rate 09/23/2018 0946 (!) 106     Resp 08/26/2018 0946 16     Temp 09/17/2018 0946 97.9 F (36.6 C)     Temp Source 09/04/2018 0946 Oral     SpO2 08/28/2018 0946 92 %     Weight 09/16/2018 0948 215 lb 9.8 oz (97.8 kg)     Height 09/10/2018 0948 6\' 5"  (1.956 m)     Head Circumference --      Peak Flow --      Pain Score 08/30/2018 0947 8     Pain Loc --      Pain Edu? --      Excl. in [place]? --     Constitutional: Alert and oriented.  Relatively well appearing and in no acute  distress. Eyes: Conjunctivae are normal.  Head: Atraumatic. Nose: No congestion/rhinnorhea. Mouth/Throat: Mucous membranes are moist.   Neck: Normal range of motion.  Cardiovascular: Normal rate, regular rhythm. Good peripheral circulation. Respiratory: Normal respiratory effort.  No retractions.  Gastrointestinal: Soft and nontender. No distention.  Genitourinary: No flank tenderness. Musculoskeletal: No lower extremity edema.  Extremities warm and well perfused.  Pain on range of motion of the left hip.  No deformity.  2+ DP pulses bilaterally.  No midline spinal tenderness. Neurologic:  Normal speech and language. No gross focal neurologic deficits are appreciated.  Skin:  Skin is warm and dry. No rash noted. Psychiatric: Mood and affect are normal. Speech and behavior are normal.  ____________________________________________   LABS (all labs ordered are listed, but only abnormal results are displayed)  Labs Reviewed  BASIC METABOLIC PANEL - Abnormal; Notable for the following components:      Result Value   Glucose, Bld 131 (*)    BUN 56 (*)    Creatinine, Ser 5.98 (*)    Calcium 8.4 (*)    GFR calc non Af Amer 8 (*)    GFR calc Af Amer 10 (*)    All other components within normal limits  CBC WITH DIFFERENTIAL/PLATELET - Abnormal; Notable for the following components:   RBC 4.13 (*)    Hemoglobin 12.1 (*)    HCT 36.9 (*)    RDW 15.9 (*)    All other components within normal limits  SARS CORONAVIRUS 2 (HOSPITAL ORDER, [place])  URINALYSIS, COMPLETE (UACMP) WITH MICROSCOPIC   ____________________________________________  EKG  ED ECG REPORT I, Arta Silence, the attending physician, personally viewed and interpreted this ECG.  Date: 09/09/2018 EKG Time: 1213 Rate: 104 Rhythm: Sinus tachycardia QRS Axis: normal Intervals: normal ST/T Wave abnormalities: Nonspecific inferior ST abnormalities Narrative Interpretation: no evidence  of acute ischemia  ____________________________________________  RADIOLOGY  XR pelvis: No acute fracture XR L femur: No acute fracture XR L tibia/fibula: No acute fracture CXR: Chronic interstitial disease with no acute abnormality CT L hip: Subcapital femoral neck fracture, minimally displaced ____________________________________________   PROCEDURES  Procedure(s) performed: No  Procedures  Critical Care performed: No ____________________________________________   INITIAL IMPRESSION /  ASSESSMENT AND PLAN / ED COURSE  Pertinent labs & imaging results that were available during my care of the patient were reviewed by me and considered in my medical decision making (see chart for details).  73 year old male with PMH as noted above presents with a left leg pain after a fall from standing height yesterday.  He initially was able to ambulate a bit although today has not been able to get out of bed.  He also reports mild generalized weakness.  He denies any other focal symptoms.  On exam he is relatively comfortable appearing.  His vital signs are normal except for borderline tachycardia.  The remainder of the exam is as described above.  The left lower extremity is neuro/vascular intact but he does have pain to both the proximal and distal leg and difficulty with range of motion particularly at the hip.  We will obtain x-rays of the left extremity, basic labs to evaluate for etiology of weakness, and reassess.  ----------------------------------------- 3:58 PM on 09/13/2018 -----------------------------------------  The lab work-up is unremarkable and consistent with the patient's baseline.  X-ray revealed no acute traumatic findings.  However given the patient's pain around the left hip and my high suspicion for occult injury, I obtained a CT which does reveal a minimally displaced subcapital femoral neck fracture.  I consulted Dr.  Mack Guise from orthopedics who will come to  evaluate the patient.  I signed the patient out to the hospitalist Dr. Posey Pronto for admission.  ____________________________________________   FINAL CLINICAL IMPRESSION(S) / ED DIAGNOSES  Final diagnoses:  Closed fracture of neck of left femur, initial encounter ([place])      NEW MEDICATIONS STARTED DURING THIS VISIT:  New Prescriptions   No medications on file     Note:  This document was prepared using Dragon voice recognition software and may include unintentional dictation errors.    Arta Silence, MD 09/13/2018 [phone]

## 2018-08-31 NOTE — Procedures (Signed)
Central Venous Dailysis Catheter Placement: TRIPLE LUMEN  Indication: Hemo Dialysis/CRRT   Consent:emergent   Hand washing performed prior to starting the procedure.   Procedure: An active timeout was performed and correct patient, name, & ID confirmed. Patient was positioned correctly for central venous access. Patient was prepped using strict sterile technique including chlorohexadine preps, sterile drape, sterile gown and sterile gloves.  The area was prepped, draped and anesthetized in the usual sterile manner. Patient comfort was obtained.    A Double lumen catheter was placed in LEFT IJ  Vein There was good blood return, catheter caps were placed on lumens, catheter flushed easily, the line was secured and a sterile dressing and BIO-PATCH applied.   Ultrasound was used to visualize vasculature and guidance of needle.   Number of Attempts: 1 Complications:none  Estimated Blood Loss: none Operator: Shailyn Weyandt.   Rhea Kaelin David Ethlyn Alto, M.D.  [facility]  Medical Director ICU-[facility] [place] Medical Director [facility] Cardio-Pulmonary Department     

## 2018-08-31 NOTE — Progress Notes (Signed)
[facility] Progress Note Patient Name: Joseph Newman DOB: 1945-02-23 MRN: [id number]   Date of Service  09/14/2018  HPI/Events of Note  Patient went into bradycardia then PEA arrest. ROSC after 1 epi, 2 calcium, 1 bicarb, D50/insulin 8, albuterol.  eICU Interventions  Nephrology called, spoke with Dr. Candiss Norse, stat dialysis ordered. Ideally CRRT however this is not available in this facility and patient too unstable to transfer at this time.     Intervention Category Major Interventions: Code management / supervision;Electrolyte abnormality - evaluation and management  Judd Lien 08/26/2018, 11:12 PM

## 2018-08-31 NOTE — H&P (Signed)
[facility] NAME: Joseph Newman    MR#:  [id number]  DATE OF BIRTH:  1945/01/25  DATE OF ADMISSION:  09/07/2018  PRIMARY CARE PHYSICIAN: Steele Sizer, MD   REQUESTING/REFERRING PHYSICIAN: Arta Silence, MD  CHIEF COMPLAINT:   Chief Complaint  Patient presents with  . Fall    HISTORY OF PRESENT ILLNESS: Joseph Newman  is a 73 y.o. male with a known history of multiple medical problems including end-stage renal disease, diabetes type 2, gout, hypertension, hyperlipidemia, untreated sleep apnea who uses a walker to walk patient was trying to get out of the bathroom and could not turn properly and fell.  His wife was there but she could not help him.  Patient started having pain in the left hip.  Evaluation here shows left hip fracture.  Patient also was supposed to get dialyzed today which he has not received his dialysis.  He denies any chest pain or shortness of breath. PAST MEDICAL HISTORY:   Past Medical History:  Diagnosis Date  . Acute metabolic encephalopathy [id number]   Per my chart  . Arthritis   . Chronic airway obstruction, not elsewhere classified    worked in [employer] and was exposed to [employer]  . 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 Holley Raring, nephrologist.  on dialysis tu - thur - sat  . Depressive disorder, not elsewhere classified   . Dialysis patient [facility]) 01/2018   dialysis tuesday - thursday - saturday  . Gout   . Gout   . Heart murmur    followed by dr. Clayborn Bigness  . Hyperlipidemia   . Hypertension   . Hypertrophy of prostate without urinary obstruction and other lower urinary tract symptoms (LUTS)   . Impotence of organic origin   . Microalbuminuria   . 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 Obesity, unspecified   . Plantar wart   . Sleep apnea    years ago but never used a cpap  . Synovitis and tenosynovitis, unspecified   . Type II or unspecified type  diabetes mellitus without mention of complication, uncontrolled   . Unspecified venous (peripheral) insufficiency   . UTI (urinary tract infection)    frequent with ESBL    PAST SURGICAL HISTORY:  Past Surgical History:  Procedure Laterality Date  . AV FISTULA PLACEMENT Right 03/08/2018   Procedure: ARTERIOVENOUS (AV) FISTULA CREATION;  Surgeon: Katha Cabal, MD;  Location: [facility] ORS;  Service: Vascular;  Laterality: Right;  . DIALYSIS/PERMA CATHETER INSERTION N/A 09/28/2017   Procedure: DIALYSIS/PERMA CATHETER INSERTION;  Surgeon: Katha Cabal, MD;  Location: [facility];  Service: Cardiovascular;  Laterality: N/A;  . DIALYSIS/PERMA CATHETER INSERTION     peritoneal catheter  . foot surgery Right    repair of calus  . HIP PINNING,CANNULATED Right 05/16/2018   Procedure: CANNULATED HIP PINNING;  Surgeon: Hessie Knows, MD;  Location: [facility] ORS;  Service: Orthopedics;  Laterality: Right;  . REMOVAL OF A DIALYSIS CATHETER N/A 03/08/2018   Procedure: REMOVAL OF A DIALYSIS CATHETER;  Surgeon: Katha Cabal, MD;  Location: [facility] ORS;  Service: Vascular;  Laterality: N/A;    SOCIAL HISTORY:  Social History   Tobacco Use  . Smoking status: Former Smoker    Packs/day: 0.25    Years: 30.00    Pack years: 7.50    Types: Cigarettes, Cigars    Quit date: 07/23/2017    Years since  quitting: 1.1  . Smokeless tobacco: Never Used  Substance Use Topics  . Alcohol use: Not Currently    Alcohol/week: 0.0 standard drinks    FAMILY HISTORY:  Family History  Problem Relation Age of Onset  . Hypertension Mother   . Kidney disease Neg Hx   . Prostate cancer Neg Hx     DRUG ALLERGIES: No Known Allergies  REVIEW OF SYSTEMS:   CONSTITUTIONAL: No fever, positive fatigue or positive weakness.  EYES: No blurred or double vision.  EARS, NOSE, AND THROAT: No tinnitus or ear pain.  RESPIRATORY: No cough, shortness of breath, wheezing or hemoptysis.  CARDIOVASCULAR: No chest  pain, orthopnea, edema.  GASTROINTESTINAL: No nausea, vomiting, diarrhea or abdominal pain.  GENITOURINARY: No dysuria, hematuria.  ENDOCRINE: No polyuria, nocturia,  HEMATOLOGY: No anemia, easy bruising or bleeding SKIN: No rash or lesion. MUSCULOSKELETAL: No joint pain or arthritis.  Left hip pain NEUROLOGIC: No tingling, numbness, weakness.  PSYCHIATRY: No anxiety or depression.   MEDICATIONS AT HOME:  Prior to Admission medications   Medication Sig Start Date End Date Taking? Authorizing Provider  allopurinol (ZYLOPRIM) 100 MG tablet TAKE 0.5 TABLETS (50 MG TOTAL) BY MOUTH EVERY OTHER DAY. 07/18/18 10/16/18  Steele Sizer, MD  apixaban (ELIQUIS) 5 MG TABS tablet Take 1 tablet (5 mg total) by mouth 2 (two) times daily. 05/24/18   Vaughan Basta, MD  aspirin EC 81 MG tablet Take 81 mg by mouth daily.    [provider]  atorvastatin (LIPITOR) 40 MG tablet TAKE 1 TABLET BY MOUTH EVERY DAY 08/13/18   Ancil Boozer, Drue Stager, MD  calcitRIOL (ROCALTROL) 0.25 MCG capsule Take 1 capsule (0.25 mcg total) by mouth daily. 06/24/18   Steele Sizer, MD  carvedilol (COREG) 6.25 MG tablet Take 6.25 mg by mouth 2 (two) times daily. 06/14/18   [provider]  cholecalciferol (VITAMIN D3) 25 MCG (1000 UT) tablet Take 2 capsules by mouth daily.     [provider]  Cyanocobalamin (VITAMIN B-12) 1000 MCG SUBL Take 1 tablet by mouth daily.    [provider]  docusate sodium (COLACE) 100 MG capsule Take 100 mg by mouth daily.    [provider]  gabapentin (NEURONTIN) 100 MG capsule Take 1 capsule (100 mg total) by mouth 3 (three) times daily. 08/30/18   Edrick Kins, DPM  lidocaine-prilocaine (EMLA) cream  04/09/18   [provider]  Multiple Vitamin (MULTIVITAMINS PO) Take 1 tablet by mouth daily.    [provider]  Olmesartan-amLODIPine-HCTZ 40-10-25 MG TABS TAKE 1 TABLET BY MOUTH EVERY DAY 07/22/18   Ancil Boozer, Drue Stager, MD  sevelamer carbonate  (RENVELA) 800 MG tablet TAKE 2 TABLETS BY MOUTH THREE TIMES A DAY WITH MEALS 05/14/18   [provider]  tamsulosin (FLOMAX) 0.4 MG CAPS capsule TAKE 1 CAPSULE (0.4 MG TOTAL) BY MOUTH DAILY AFTER BREAKFAST. 04/09/18   Sowles, Drue Stager, MD  TRULICITY 1.5 0000000 SOPN INJECT 1.5 MG INTO THE SKIN SEE ADMIN INSTRUCTIONS. INJECTS ON SUNDAYS 07/29/18   Steele Sizer, MD      PHYSICAL EXAMINATION:   VITAL SIGNS: Blood pressure 140/82, pulse (!) 101, temperature 97.9 F (36.6 C), temperature source Oral, resp. rate 18, height 6\' 5"  (1.956 m), weight 97.8 kg, SpO2 91 %.  GENERAL:  73 y.o.-year-old patient lying in the bed with no acute distress.  EYES: Pupils equal, round, reactive to light and accommodation. No scleral icterus. Extraocular muscles intact.  HEENT: Head atraumatic, normocephalic. Oropharynx and nasopharynx clear.  NECK:  Supple, no jugular venous distention. No thyroid enlargement, no tenderness.  LUNGS: Normal breath sounds bilaterally, no wheezing, rales,rhonchi or crepitation. No use of accessory muscles of respiration.  CARDIOVASCULAR: S1, S2 normal. No murmurs, rubs, or gallops.  ABDOMEN: Soft, nontender, nondistended. Bowel sounds present. No organomegaly or mass.  EXTREMITIES: No pedal edema, cyanosis, or clubbing.  NEUROLOGIC: Cranial nerves II through XII are intact. Muscle strength 5/5 in all extremities. Sensation intact. Gait not checked.  PSYCHIATRIC: The patient is alert and oriented x 3.  SKIN: No obvious rash, lesion, or ulcer.  Left foot he has dressing in place placed by podiatry yesterday  LABORATORY PANEL:   CBC Recent Labs  Lab 09/07/2018 1222  WBC 8.9  HGB 12.1*  HCT 36.9*  PLT 204  MCV 89.3  MCH 29.3  MCHC 32.8  RDW 15.9*  LYMPHSABS 2.2  MONOABS 0.6  EOSABS 0.4  BASOSABS 0.1   ------------------------------------------------------------------------------------------------------------------  Chemistries  Recent Labs  Lab 09/05/2018 1222   NA 135  K 5.1  CL 100  CO2 23  GLUCOSE 131*  BUN 56*  CREATININE 5.98*  CALCIUM 8.4*   ------------------------------------------------------------------------------------------------------------------ estimated creatinine clearance is 13.5 mL/min (A) (by C-G formula based on SCr of 5.98 mg/dL (H)). ------------------------------------------------------------------------------------------------------------------ No results for input(s): TSH, T4TOTAL, T3FREE, THYROIDAB in the last 72 hours.  Invalid input(s): FREET3   Coagulation profile No results for input(s): INR, PROTIME in the last 168 hours. ------------------------------------------------------------------------------------------------------------------- No results for input(s): DDIMER in the last 72 hours. -------------------------------------------------------------------------------------------------------------------  Cardiac Enzymes No results for input(s): CKMB, TROPONINI, MYOGLOBIN in the last 168 hours.  Invalid input(s): CK ------------------------------------------------------------------------------------------------------------------ Invalid input(s): POCBNP  ---------------------------------------------------------------------------------------------------------------  Urinalysis    Component Value Date/Time   COLORURINE GROSSLY BLOODY (A) 05/16/2018 0417   APPEARANCEUR TURBID (A) 05/16/2018 0417   APPEARANCEUR Cloudy (A) 03/22/2018 1052   LABSPEC 1.012 05/16/2018 0417   LABSPEC 1.023 07/10/2013 0024   PHURINE  05/16/2018 0417    TEST NOT REPORTED DUE TO COLOR INTERFERENCE OF URINE PIGMENT   GLUCOSEU (A) 05/16/2018 0417    TEST NOT REPORTED DUE TO COLOR INTERFERENCE OF URINE PIGMENT   GLUCOSEU >=500 07/10/2013 0024   HGBUR (A) 05/16/2018 0417    TEST NOT REPORTED DUE TO COLOR INTERFERENCE OF URINE PIGMENT   BILIRUBINUR (A) 05/16/2018 0417    TEST NOT REPORTED DUE TO COLOR INTERFERENCE OF URINE  PIGMENT   BILIRUBINUR Negative 03/22/2018 1052   BILIRUBINUR Negative 07/10/2013 0024   KETONESUR (A) 05/16/2018 0417    TEST NOT REPORTED DUE TO COLOR INTERFERENCE OF URINE PIGMENT   PROTEINUR (A) 05/16/2018 0417    TEST NOT REPORTED DUE TO COLOR INTERFERENCE OF URINE PIGMENT   NITRITE (A) 05/16/2018 0417    TEST NOT REPORTED DUE TO COLOR INTERFERENCE OF URINE PIGMENT   LEUKOCYTESUR (A) 05/16/2018 0417    TEST NOT REPORTED DUE TO COLOR INTERFERENCE OF URINE PIGMENT   LEUKOCYTESUR Negative 07/10/2013 0024     RADIOLOGY: Dg Pelvis 1-2 Views  Result Date: 09/03/2018 CLINICAL DATA:  Fall yesterday. Inability to stand or bear weight. EXAM: PELVIS - 1-2 VIEW COMPARISON:  Pelvic and right hip radiographs 05/15/2018. Pelvic CT 07/31/2018. FINDINGS: The bones are mildly demineralized. Previous right femoral neck pinning with intact hardware. No evidence of acute fracture or dislocation. The sacroiliac joints and symphysis pubis are intact IMPRESSION: No evidence of acute pelvic fracture. Previous right femoral neck pinning. Electronically Signed   By: Richardean Sale M.D.   On: 09/11/2018 12:27   Dg  Tibia/fibula Left  Result Date: 09/06/2018 CLINICAL DATA:  Fall yesterday. Inability to stand or bear weight. EXAM: LEFT TIBIA AND FIBULA - 2 VIEW COMPARISON:  None. FINDINGS: The bones are demineralized. No evidence of acute fracture or dislocation. Possible mild soft tissue swelling medially in the ankle. No foreign body. IMPRESSION: No evidence of acute left lower leg injury. Electronically Signed   By: Richardean Sale M.D.   On: 09/11/2018 12:25   Ct Hip Left Wo Contrast  Result Date: 08/26/2018 CLINICAL DATA:  Hip pain since falling yesterday. EXAM: CT OF THE LEFT HIP WITHOUT CONTRAST TECHNIQUE: Multidetector CT imaging of the left hip was performed according to the standard protocol. Multiplanar CT image reconstructions were also generated. COMPARISON:  Radiographs 09/20/2018. pelvic CT 07/31/2018.  FINDINGS: Bones/Joint/Cartilage The bones appear demineralized. There is a minimally impacted and minimally displaced subcapital fracture of the left femoral neck. The femoral head is intact and located. No evidence of left pelvic fracture or significant left hip arthropathy. There is a small left hip joint effusion. Ligaments Suboptimally assessed by CT. Muscles and Tendons The left hip muscles and tendons appear intact. There is a probable trochanteric bursal fluid collection lateral to the greater trochanter, measuring 5.6 cm on coronal image 55/6, similar to previous CT. No focal hematoma. Soft tissues There is a left inguinal hernia containing fat and moderate enlargement of the prostate gland. No evidence of pelvic hematoma. IMPRESSION: 1. Acute, minimally impacted and minimally displaced subcapital fracture of the left femoral neck. 2. No evidence of left pelvic fracture or significant left hip arthropathy. Electronically Signed   By: Richardean Sale M.D.   On: 09/20/2018 14:12   Dg Chest Portable 1 View  Result Date: 09/07/2018 CLINICAL DATA:  73 year old male with a history of fall and weakness EXAM: PORTABLE CHEST 1 VIEW COMPARISON:  May 20, 2018 FINDINGS: Cardiomediastinal silhouette unchanged in size and contour with cardiomegaly. Calcifications of the aortic arch. Reticular opacities throughout the lungs with improved aeration compared to the prior. No pleural effusion or pneumothorax. No confluent airspace disease. No visualized displaced fracture IMPRESSION: Improved aeration compared to the prior plain film with persisting reticular pattern of opacity most likely representing chronic interstitial lung disease. Electronically Signed   By: Corrie Mckusick D.O.   On: 09/08/2018 12:26   Dg Femur Min 2 Views Left  Result Date: 08/28/2018 CLINICAL DATA:  Fall yesterday.  Inability to stand or bear weight. EXAM: LEFT FEMUR 2 VIEWS COMPARISON:  Pelvic and right hip radiographs 05/14/2008 FINDINGS: The  bones are mildly demineralized. There is no evidence of acute fracture or dislocation. There are mild tricompartmental degenerative changes at the left knee. Mild vascular calcifications are noted. IMPRESSION: No evidence of acute left femoral injury. Electronically Signed   By: Richardean Sale M.D.   On: 09/03/2018 12:24    EKG: Orders placed or performed during the hospital encounter of 09/09/2018  . ED EKG  . ED EKG  . EKG 12-Lead  . EKG 12-Lead    IMPRESSION AND PLAN: Patient is a 73 year old who is on hemodialysis and diabetes now being admitted with hip fracture   1.  Left hip fracture orthopedics has been notified Patient is moderate risk of cardiopulmonary complications there is no further cardiopulmonary work-up needed We will hold aspirin and Eliquis  2.  End-stage renal disease nephrology has been notified of patient's admission  3.  Diabetes type 2 we will place on sliding scale I have been told by pharmacy they do  not have Trulicity  4.  Essential hypertension continue therapy with Coreg , olmasartan, amlodopine  5.  Chronic anticoagulation with Eliquis unclear etiology I was not able to find any records from his primary care provider or cardiology notes will continue hold this for now  6.  Left foot wound will continue wound care as doing at home   All the records are reviewed and case discussed with ED provider. Management plans discussed with the patient, family and they are in agreement.  CODE STATUS: Code Status History    Date Active Date Inactive Code Status Order ID Comments User Context   05/15/2018 2116 05/24/2018 2108 Full Code [id number]  Sela Hua, MD Inpatient   11/01/2017 1835 11/07/2017 2158 DNR [id number]  Demetrios Loll, MD Inpatient   09/25/2017 1616 09/29/2017 2043 Full Code [id number]  Epifanio Lesches, MD ED   Advance Care Planning Activity       TOTAL TIME TAKING CARE OF THIS PATIENT:24minutes.    Dustin Flock M.D on 09/23/2018 at 3:29  PM  Between 7am to 6pm - Pager - [phone]  After 6pm go to [email or web address] - password [email or web address]  [facility]  [phone]  CC: Primary care physician; Steele Sizer, MD

## 2018-08-31 NOTE — Progress Notes (Signed)
RRT called, blood coming from penis after foley removal

## 2018-08-31 NOTE — Consult Note (Signed)
ORTHOPAEDIC CONSULTATION  REQUESTING PHYSICIAN: Dustin Flock, MD  Chief Complaint: Left nondisplaced femoral neck hip fracture  HPI: Joseph Newman is a 73 y.o. male who complains of left hip pain status post fall at home.  Patient is seen in his hospital room with his wife at the bedside.  Patient has had a previous percutaneous fixation of his right hip by Dr. Rudene Christians on 05/16/2018.    Past Medical History:  Diagnosis Date  . Acute metabolic encephalopathy [id number]   Per my chart  . Arthritis   . Chronic airway obstruction, not elsewhere classified    worked in [employer] and was exposed to [employer]  . 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 Holley Raring, nephrologist.  on dialysis tu - thur - sat  . Depressive disorder, not elsewhere classified   . Dialysis patient [facility]) 01/2018   dialysis tuesday - thursday - saturday  . Gout   . Gout   . Heart murmur    followed by dr. Clayborn Bigness  . Hyperlipidemia   . Hypertension   . Hypertrophy of prostate without urinary obstruction and other lower urinary tract symptoms (LUTS)   . Impotence of organic origin   . Microalbuminuria   . 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 Obesity, unspecified   . Plantar wart   . Sleep apnea    years ago but never used a cpap  . Synovitis and tenosynovitis, unspecified   . Type II or unspecified type diabetes mellitus without mention of complication, uncontrolled   . Unspecified venous (peripheral) insufficiency   . UTI (urinary tract infection)    frequent with ESBL   Past Surgical History:  Procedure Laterality Date  . AV FISTULA PLACEMENT Right 03/08/2018   Procedure: ARTERIOVENOUS (AV) FISTULA CREATION;  Surgeon: Katha Cabal, MD;  Location: [facility];  Service: Vascular;  Laterality: Right;  . DIALYSIS/PERMA CATHETER INSERTION N/A 09/28/2017   Procedure: DIALYSIS/PERMA CATHETER INSERTION;  Surgeon: Katha Cabal, MD;  Location: [place] [facility];   Service: Cardiovascular;  Laterality: N/A;  . DIALYSIS/PERMA CATHETER INSERTION     peritoneal catheter  . foot surgery Right    repair of calus  . HIP PINNING,CANNULATED Right 05/16/2018   Procedure: CANNULATED HIP PINNING;  Surgeon: Hessie Knows, MD;  Location: [facility] OR[facility];  Service: Orthopedics;  Laterality: Right;  . REMOVAL OF A DIALYSIS CATHETER N/A 03/08/2018   Procedure: REMOVAL OF A DIALYSIS CATHETER;  Surgeon: Katha Cabal, MD;  Location: [facility];  Service: Vascular;  Laterality: N/A;   Social History   Socioeconomic History  . Marital status: Married    Spouse name: frances  . Number of children: 2  . Years of education: Not on file  . Highest education level: Not on file  Occupational History  . Occupation:  Engineer, manufacturing systems,     Comment: retired  Scientific laboratory technician  . Financial resource strain: Not very hard  . Food insecurity    Worry: Never true    Inability: Never true  . Transportation needs    Medical: No    Non-medical: No  Tobacco Use  . Smoking status: Former Smoker    Packs/day: 0.25    Years: 30.00    Pack years: 7.50    Types: Cigarettes, Cigars    Quit date: 07/23/2017    Years since quitting: 1.1  . Smokeless tobacco: Never Used  Substance and Sexual Activity  . Alcohol use: Not Currently  Alcohol/week: 0.0 standard drinks  . Drug use: No  . Sexual activity: Not Currently  Lifestyle  . Physical activity    Days per week: 0 days    Minutes per session: 0 min  . Stress: Not at all  Relationships  . Social Herbalist on phone: Twice a week    Gets together: Once a week    Attends religious service: Never    Active member of club or organization: No    Attends meetings of clubs or organizations: Never    Relationship status: Married  Other Topics Concern  . Not on file  Social History Narrative   Lives with wife   Very poor balance, uses wheelchair   Family History  Problem Relation Age of Onset  . Hypertension Mother   .  Kidney disease Neg Hx   . Prostate cancer Neg Hx    No Known Allergies Prior to Admission medications   Medication Sig Start Date End Date Taking? Authorizing Provider  allopurinol (ZYLOPRIM) 100 MG tablet TAKE 0.5 TABLETS (50 MG TOTAL) BY MOUTH EVERY OTHER DAY. 07/18/18 10/16/18 Yes Sowles, Drue Stager, MD  aspirin EC 81 MG tablet Take 81 mg by mouth daily.   Yes [provider]  atorvastatin (LIPITOR) 40 MG tablet TAKE 1 TABLET BY MOUTH EVERY DAY Patient taking differently: Take 40 mg by mouth at bedtime.  08/13/18  Yes Sowles, Drue Stager, MD  calcitRIOL (ROCALTROL) 0.25 MCG capsule Take 1 capsule (0.25 mcg total) by mouth daily. Patient taking differently: Take 0.25 mcg by mouth at bedtime.  06/24/18  Yes Sowles, Drue Stager, MD  carvedilol (COREG) 6.25 MG tablet Take 6.25 mg by mouth 2 (two) times daily. 06/14/18  Yes [provider]  cholecalciferol (VITAMIN D3) 25 MCG (1000 UT) tablet Take 1,000 Units by mouth 2 (two) times daily.    Yes [provider]  Cyanocobalamin (VITAMIN B-12) 1000 MCG SUBL Take 1 tablet by mouth daily.   Yes [provider]  docusate sodium (COLACE) 100 MG capsule Take 100 mg by mouth at bedtime.    Yes [provider]  Multiple Vitamin (MULTIVITAMINS PO) Take 1 tablet by mouth daily.   Yes [provider]  Olmesartan-amLODIPine-HCTZ 40-10-25 MG TABS TAKE 1 TABLET BY MOUTH EVERY DAY Patient taking differently: Take 1 tablet by mouth daily.  07/22/18  Yes Sowles, Drue Stager, MD  sevelamer carbonate (RENVELA) 800 MG tablet Take 1,600 mg by mouth 3 (three) times daily.  05/14/18  Yes [provider]  tamsulosin (FLOMAX) 0.4 MG CAPS capsule TAKE 1 CAPSULE (0.4 MG TOTAL) BY MOUTH DAILY AFTER BREAKFAST. Patient taking differently: Take 0.4 mg by mouth at bedtime.  04/09/18  Yes Sowles, Drue Stager, MD  TRULICITY 1.5 0000000 SOPN INJECT 1.5 MG INTO THE SKIN SEE ADMIN INSTRUCTIONS. INJECTS ON SUNDAYS Patient taking differently: Inject  1.5 mg into the skin every Sunday.  07/29/18  Yes Sowles, Drue Stager, MD  apixaban (ELIQUIS) 5 MG TABS tablet Take 1 tablet (5 mg total) by mouth 2 (two) times daily. Patient not taking: Reported on 09/16/2018 05/24/18   Vaughan Basta, MD  gabapentin (NEURONTIN) 100 MG capsule Take 1 capsule (100 mg total) by mouth 3 (three) times daily. 08/30/18   Edrick Kins, DPM   Dg Pelvis 1-2 Views  Result Date: 08/30/2018 CLINICAL DATA:  Fall yesterday. Inability to stand or bear weight. EXAM: PELVIS - 1-2 VIEW COMPARISON:  Pelvic and right hip radiographs 05/15/2018. Pelvic CT 07/31/2018. FINDINGS: The bones are mildly  demineralized. Previous right femoral neck pinning with intact hardware. No evidence of acute fracture or dislocation. The sacroiliac joints and symphysis pubis are intact IMPRESSION: No evidence of acute pelvic fracture. Previous right femoral neck pinning. Electronically Signed   By: Richardean Sale M.D.   On: 09/03/2018 12:27   Dg Tibia/fibula Left  Result Date: 09/11/2018 CLINICAL DATA:  Fall yesterday. Inability to stand or bear weight. EXAM: LEFT TIBIA AND FIBULA - 2 VIEW COMPARISON:  None. FINDINGS: The bones are demineralized. No evidence of acute fracture or dislocation. Possible mild soft tissue swelling medially in the ankle. No foreign body. IMPRESSION: No evidence of acute left lower leg injury. Electronically Signed   By: Richardean Sale M.D.   On: 09/17/2018 12:25   Ct Hip Left Wo Contrast  Result Date: 09/12/2018 CLINICAL DATA:  Hip pain since falling yesterday. EXAM: CT OF THE LEFT HIP WITHOUT CONTRAST TECHNIQUE: Multidetector CT imaging of the left hip was performed according to the standard protocol. Multiplanar CT image reconstructions were also generated. COMPARISON:  Radiographs 09/13/2018. pelvic CT 07/31/2018. FINDINGS: Bones/Joint/Cartilage The bones appear demineralized. There is a minimally impacted and minimally displaced subcapital fracture of the left femoral neck. The  femoral head is intact and located. No evidence of left pelvic fracture or significant left hip arthropathy. There is a small left hip joint effusion. Ligaments Suboptimally assessed by CT. Muscles and Tendons The left hip muscles and tendons appear intact. There is a probable trochanteric bursal fluid collection lateral to the greater trochanter, measuring 5.6 cm on coronal image 55/6, similar to previous CT. No focal hematoma. Soft tissues There is a left inguinal hernia containing fat and moderate enlargement of the prostate gland. No evidence of pelvic hematoma. IMPRESSION: 1. Acute, minimally impacted and minimally displaced subcapital fracture of the left femoral neck. 2. No evidence of left pelvic fracture or significant left hip arthropathy. Electronically Signed   By: Richardean Sale M.D.   On: 09/17/2018 14:12   Dg Chest Portable 1 View  Result Date: 09/17/2018 CLINICAL DATA:  73 year old male with a history of fall and weakness EXAM: PORTABLE CHEST 1 VIEW COMPARISON:  May 20, 2018 FINDINGS: Cardiomediastinal silhouette unchanged in size and contour with cardiomegaly. Calcifications of the aortic arch. Reticular opacities throughout the lungs with improved aeration compared to the prior. No pleural effusion or pneumothorax. No confluent airspace disease. No visualized displaced fracture IMPRESSION: Improved aeration compared to the prior plain film with persisting reticular pattern of opacity most likely representing chronic interstitial lung disease. Electronically Signed   By: Corrie Mckusick D.O.   On: 09/04/2018 12:26   Dg Femur Min 2 Views Left  Result Date: 08/26/2018 CLINICAL DATA:  Fall yesterday.  Inability to stand or bear weight. EXAM: LEFT FEMUR 2 VIEWS COMPARISON:  Pelvic and right hip radiographs 05/14/2008 FINDINGS: The bones are mildly demineralized. There is no evidence of acute fracture or dislocation. There are mild tricompartmental degenerative changes at the left knee. Mild  vascular calcifications are noted. IMPRESSION: No evidence of acute left femoral injury. Electronically Signed   By: Richardean Sale M.D.   On: 08/31/2018 12:24    Positive ROS: All other systems have been reviewed and were otherwise negative with the exception of those mentioned in the HPI and as above.  Physical Exam: General: Alert, no acute distress  MUSCULOSKELETAL: Left lower extremity: Patient has intact skin overlying the left hip.  He has a healing ulcer on the left foot which is being treated by  his podiatrist.  A bandage overlies this ulcer.  Patient has palpable pedal pulses, intact sensation light touch and intact motor function distally.  He does not have any significant external rotation or shortening of his left lower extremity.  Assessment: Nondisplaced fracture of the left femoral neck  Plan: I explained to the patient and his wife that he has sustained a similar fracture to the one he had on his right side.  Treatment will be the same as well.  I am recommending percutaneous fixation for his nondisplaced fracture.  I reviewed with them the details of the operation as well as the postoperative course.  I discussed the risks and benefits of surgery. The patient and his wife are familiar with the risks of surgery having been through before.  They understand the risks risks include but are not limited to infection, bleeding , nerve or blood vessel injury, joint stiffness or loss of motion, persistent pain, weakness or instability, malunion, nonunion and hardware failure and the need for further surgery. Medical risks include but are not limited to DVT and pulmonary embolism, myocardial infarction, stroke, pneumonia, respiratory failure and death. Patient understood these risks and wished to proceed.    Thornton Park, MD    08/28/2018 6:33 PM

## 2018-08-31 NOTE — Progress Notes (Signed)
Attempted  To place foley with other nurse Osvaldo Human RN, Patient got startled, as urine was returned in the foley and balloon was being inflated, patient grabbed at foley and pulled. Blood returned in the foley tube. Foley removed. Prime doc paged. Patient then voided with blood in urine after removing foley

## 2018-08-31 NOTE — Progress Notes (Signed)
Advanced care plan.  Purpose of the Encounter: CODE STATUS  Parties in Attendance: Patient himself and wife  Patient's Decision Capacity: Intact  Subjective/Patient's story: Joseph Newman  is a 73 y.o. male with a known history of multiple medical problems including end-stage renal disease, diabetes type 2, gout, hypertension, hyperlipidemia, untreated sleep apnea who uses a walker to walk patient was trying to get out of the bathroom and could not turn properly and fell.  His wife was there but she could not help him.  Patient started having pain in the left hip.  Evaluation here shows left hip fracture.  Patient also was supposed to get dialyzed today which he has not received his dialysis.  He denies any chest pain or shortness of breath.   Objective/Medical story  I discussed with the patient and his wife regarding DNR status.  Patient previously was a DNR and he has a yellow rod form in the place.  However patient now states that he wants to be a full code and would want everything to be done  Goals of care determination: Full code    CODE STATUS: Full code   Time spent discussing advanced care planning: 16 minutes

## 2018-08-31 NOTE — Progress Notes (Signed)
73 year old male with known end-stage renal disease on dialysis, admitted for fall and left femoral neck fracture went to the floor.  Rapid response called later this afternoon for hematuria. A Foley catheter was placed on the floor and patient had pulled it out causing significant urethral trauma resulting in bleeding with blood clots were passed through the penis and fresh blood was oozing.  Stat hemoglobin is stable at 13. -Patient appears confused.-Vitals were stable at the time. Discussed with on call urologist dr. Bernardo Heater, who recommended that the hematuria should resolve on its own, just to monitor and no indication for CBI.  An hour later, another rapid response was called as patient's heart rate was greater than 200s. Dr. Jannifer Franklin went to see patient. ABG, CXR and labs ordered. As patient tachypneic, with RR in 30-40 range, HR persistently remaining >200- patient transferred to ICU Started  On Bipap for pulm edema noted on CXR.  I have notified Dr. Mack Guise (as patient was supposed to have percutaneous fixation of left femoral neck fracture tomorrow)- about patients change in status. Also called and updated patients wife Ms. Braxton over the phone.

## 2018-08-31 NOTE — Progress Notes (Signed)
[place] Progress Note Patient Name: Stony Lipper DOB: 03/22/1945 MRN: [id number]   Date of Service  09/23/2018  HPI/Events of Note  9 M HFpEF, ESRD on HD, on apixaban of unclear reason at this time, presented with left hip fracture after a fall. Transferred to ICU HR 200s BP 156/138 in respiratory distress.  eICU Interventions   Place on BiPap  Patient would likely need dialysis stat as he didn't get his scheduled dialysis today  Dr Mortimer Fries now at bedside to intubate     Intervention Category Major Interventions: Arrhythmia - evaluation and management;Respiratory failure - evaluation and management Evaluation Type: New Patient Evaluation  Judd Lien 09/02/2018, 9:11 PM

## 2018-08-31 NOTE — Progress Notes (Addendum)
Case discussed with Dr Mortimer Fries and Dr Genevive Bi (elink) Patient is critically ill Emergent HD requested Cardiac arrest earlier, patient is now on ventilator Arrythmias  CRRT at this time Will order HD with slow BFR Goal to correct potassium and remove volume as tolerated Patient is high risk of complications HD nurse is aware and enroute Orders have been placed

## 2018-08-31 NOTE — ED Triage Notes (Signed)
Pt to ED by EMS with c/o of right hip pain. Pt states fell yesterday and woke unable to move this morning. Pt has hx of left hip fracture. Pt has dementia.

## 2018-08-31 NOTE — Progress Notes (Signed)
Blood has stopped coming from penis, only few drops noticeable. Patient alert, VSS. No complaints of pain unless moving left hip. Waiting on urology to come see patient.

## 2018-08-31 NOTE — Significant Event (Addendum)
Rapid Response Event Note  Overview: Time Called: 0645 Arrival Time: [id number] Event Type: Other (Comment)(penile bleeding)  Initial Focused Assessment: called RR for penile bleeding after foley insertion and subsequent traumatic pulling out of catheter by patient.   Interventions: Dr Michail Sermon at bedside and is addressing issue with RN's. This RN she stated was not needed so returned to ICU.  Plan of Care (if not transferred): Ana and Estill Bamberg to call if further assistance needed.  Event Summary: Name of Physician Notified: Kalesetti at 1850    at    Outcome: Stayed in room and stabalized     Karis Emig A

## 2018-08-31 NOTE — Procedures (Signed)
Endotracheal Intubation: Patient required placement of an artificial airway secondary to Respiratory Failure  Consent: Emergent.   Hand washing performed prior to starting the procedure.   Medications administered for sedation prior to procedure:  Midazolam 4 mg IV,  Vecuronium 10 mg IV, Fentanyl 200 mcg IV.    A time out procedure was called and correct patient, name, & ID confirmed. Needed supplies and equipment were assembled and checked to include ETT, 10 ml syringe, Glidescope, Mac and Miller blades, suction, oxygen and bag mask valve, end tidal CO2 monitor.   Patient was positioned to align the mouth and pharynx to facilitate visualization of the glottis.   Heart rate, SpO2 and blood pressure was continuously monitored during the procedure. Pre-oxygenation was conducted prior to intubation and endotracheal tube was placed through the vocal cords into the trachea.     The artificial airway was placed under direct visualization via glidescope route using a 8.0 ETT on the first attempt.  ETT was secured at 23 cm mark.  Placement was confirmed by auscuitation of lungs with good breath sounds bilaterally and no stomach sounds.  Condensation was noted on endotracheal tube.   Pulse ox 98%.  CO2 detector in place with appropriate color change.   Complications: None .   Operator: Gwyneth Fernandez.   Chest radiograph ordered and pending.   Comments: OGT placed via glidescope.  Joseph Newman, M.D.  [facility]  Medical Director ICU-[facility] [place] Medical Director [facility]io[facility] Department       

## 2018-08-31 NOTE — ED Notes (Signed)
ED TO INPATIENT HANDOFF REPORT  ED Nurse Name and Phone #: Anderson Malta [phone]  S Name/Age/Gender [facility] 73 y.o. male Room/Bed: ED15A/ED15A  Code Status   Code Status: Prior  Home/SNF/Other Home Patient oriented to: self, place, time and situation Is this baseline? Yes   Triage Complete: Triage complete  Chief Complaint fall  Triage Note Pt to ED by EMS with c/o of right hip pain. Pt states fell yesterday and woke unable to move this morning. Pt has hx of left hip fracture. Pt has dementia.    Allergies No Known Allergies  Level of Care/Admitting Diagnosis ED Disposition    ED Disposition Condition [facility]: [place] [[id number]]  Level of Care: Med-Surg [16]  Covid Evaluation: Asymptomatic Screening Protocol (No Symptoms)  Diagnosis: Closed left hip fracture [facility]) [id number]  Admitting Physician: Dustin Flock [id number]  Attending Physician: Dustin Flock [phone]  Estimated length of stay: past midnight tomorrow  Certification:: I certify this patient will need inpatient services for at least 2 midnights  PT Class (Do Not Modify): Inpatient [101]  PT Acc Code (Do Not Modify): Private [1]       B Medical/Surgery History Past Medical History:  Diagnosis Date  . Acute metabolic encephalopathy [id number]   Per my chart  . Arthritis   . Chronic airway obstruction, not elsewhere classified    worked in [employer] and was exposed to [employer]  . 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 Holley Raring, nephrologist.  on dialysis tu - thur - sat  . Depressive disorder, not elsewhere classified   . Dialysis patient [facility]) 01/2018   dialysis tuesday - thursday - saturday  . Gout   . Gout   . Heart murmur    followed by dr. Clayborn Bigness  . Hyperlipidemia   . Hypertension   . Hypertrophy of prostate without urinary obstruction and other lower urinary tract symptoms (LUTS)   . Impotence of organic  origin   . Microalbuminuria   . 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 Obesity, unspecified   . Plantar wart   . Sleep apnea    years ago but never used a cpap  . Synovitis and tenosynovitis, unspecified   . Type II or unspecified type diabetes mellitus without mention of complication, uncontrolled   . Unspecified venous (peripheral) insufficiency   . UTI (urinary tract infection)    frequent with ESBL   Past Surgical History:  Procedure Laterality Date  . AV FISTULA PLACEMENT Right 03/08/2018   Procedure: ARTERIOVENOUS (AV) FISTULA CREATION;  Surgeon: Katha Cabal, MD;  Location: [facility] ORS;  Service: Vascular;  Laterality: Right;  . DIALYSIS/PERMA CATHETER INSERTION N/A 09/28/2017   Procedure: DIALYSIS/PERMA CATHETER INSERTION;  Surgeon: Katha Cabal, MD;  Location: [facility] LAB;  Service: Cardiovascular;  Laterality: N/A;  . DIALYSIS/PERMA CATHETER INSERTION     peritoneal catheter  . foot surgery Right    repair of calus  . HIP PINNING,CANNULATED Right 05/16/2018   Procedure: CANNULATED HIP PINNING;  Surgeon: Hessie Knows, MD;  Location: [facility] ORS;  Service: Orthopedics;  Laterality: Right;  . REMOVAL OF A DIALYSIS CATHETER N/A 03/08/2018   Procedure: REMOVAL OF A DIALYSIS CATHETER;  Surgeon: Katha Cabal, MD;  Location: [facility] ORS;  Service: Vascular;  Laterality: N/A;     A IV Location/Drains/Wounds Patient Lines/Drains/Airways Status   Active Line/Drains/Airways    Name:   Placement date:   Placement  time:   Site:   Days:   Peripheral IV 09/22/2018 Left Antecubital   09/18/2018    1221    Antecubital   less than 1   Fistula / Graft Right Forearm Arteriovenous fistula   03/08/18    0841    Forearm   176   Fistula / Graft Right Upper arm Arteriovenous fistula   05/15/18    -    Upper arm   108   Hemodialysis Catheter Right Subclavian Double-lumen;Permanent   -    -    Subclavian      Incision (Closed) 03/08/18 Arm Right   03/08/18    0843     176    Incision (Closed) 03/08/18 Abdomen Other (Comment)   03/08/18    0843     176   Incision (Closed) 05/16/18 Hip Right   05/16/18    1748     107   Pressure Injury 05/23/18 Stage II -  Partial thickness loss of dermis presenting as a shallow open ulcer with a red, pink wound bed without slough. shallow open ulcer pink wound bed   05/23/18    0236     100          Intake/Output Last 24 hours No intake or output data in the 24 hours ending 08/27/2018 1619  Labs/Imaging Results for orders placed or performed during the hospital encounter of 09/17/2018 (from the past 48 hour(s))  Basic metabolic panel     Status: Abnormal   Collection Time: 09/14/2018 12:22 PM  Result Value Ref Range   Sodium 135 135 - 145 mmol/L   Potassium 5.1 3.5 - 5.1 mmol/L   Chloride 100 98 - 111 mmol/L   CO2 23 22 - 32 mmol/L   Glucose, Bld 131 (H) 70 - 99 mg/dL   BUN 56 (H) 8 - 23 mg/dL   Creatinine, Ser 5.98 (H) 0.61 - 1.24 mg/dL   Calcium 8.4 (L) 8.9 - 10.3 mg/dL   GFR calc non Af Amer 8 (L) >60 mL/min   GFR calc Af Amer 10 (L) >60 mL/min   Anion gap 12 5 - 15    Comment: Performed at [facility], [address] [place], [place] [address]  CBC with Differential     Status: Abnormal   Collection Time: 08/31/18 12:22 PM  Result Value Ref Range   WBC 8.9 4.0 - 10.5 K/uL   RBC 4.13 (L) 4.22 - 5.81 MIL/uL   Hemoglobin 12.1 (L) 13.0 - 17.0 g/dL   HCT 36.9 (L) 39.0 - 52.0 %   MCV 89.3 80.0 - 100.0 fL   MCH 29.3 26.0 - 34.0 pg   MCHC 32.8 30.0 - 36.0 g/dL   RDW 15.9 (H) 11.5 - 15.5 %   Platelets 204 150 - 400 K/uL   nRBC 0.2 0.0 - 0.2 %   Neutrophils Relative % 63 %   Neutro Abs 5.6 1.7 - 7.7 K/uL   Lymphocytes Relative 24 %   Lymphs Abs 2.2 0.7 - 4.0 K/uL   Monocytes Relative 7 %   Monocytes Absolute 0.6 0.1 - 1.0 K/uL   Eosinophils Relative 5 %   Eosinophils Absolute 0.4 0.0 - 0.5 K/uL   Basophils Relative 1 %   Basophils Absolute 0.1 0.0 - 0.1 K/uL   Immature Granulocytes 0 %   Abs Immature  Granulocytes 0.03 0.00 - 0.07 K/uL    Comment: Performed at [facility], [address], [place], [place] [address]  Dg Pelvis 1-2 Views  Result Date: 09/07/2018 CLINICAL DATA:  Fall yesterday. Inability to stand or bear weight. EXAM: PELVIS - 1-2 VIEW COMPARISON:  Pelvic and right hip radiographs 05/15/2018. Pelvic CT 07/31/2018. FINDINGS: The bones are mildly demineralized. Previous right femoral neck pinning with intact hardware. No evidence of acute fracture or dislocation. The sacroiliac joints and symphysis pubis are intact IMPRESSION: No evidence of acute pelvic fracture. Previous right femoral neck pinning. Electronically Signed   By: Richardean Sale M.D.   On: 09/11/2018 12:27   Dg Tibia/fibula Left  Result Date: 08/30/2018 CLINICAL DATA:  Fall yesterday. Inability to stand or bear weight. EXAM: LEFT TIBIA AND FIBULA - 2 VIEW COMPARISON:  None. FINDINGS: The bones are demineralized. No evidence of acute fracture or dislocation. Possible mild soft tissue swelling medially in the ankle. No foreign body. IMPRESSION: No evidence of acute left lower leg injury. Electronically Signed   By: Richardean Sale M.D.   On: 09/02/2018 12:25   Ct Hip Left Wo Contrast  Result Date: 09/18/2018 CLINICAL DATA:  Hip pain since falling yesterday. EXAM: CT OF THE LEFT HIP WITHOUT CONTRAST TECHNIQUE: Multidetector CT imaging of the left hip was performed according to the standard protocol. Multiplanar CT image reconstructions were also generated. COMPARISON:  Radiographs 09/03/2018. pelvic CT 07/31/2018. FINDINGS: Bones/Joint/Cartilage The bones appear demineralized. There is a minimally impacted and minimally displaced subcapital fracture of the left femoral neck. The femoral head is intact and located. No evidence of left pelvic fracture or significant left hip arthropathy. There is a small left hip joint effusion. Ligaments Suboptimally assessed by CT. Muscles and Tendons The left hip muscles and tendons  appear intact. There is a probable trochanteric bursal fluid collection lateral to the greater trochanter, measuring 5.6 cm on coronal image 55/6, similar to previous CT. No focal hematoma. Soft tissues There is a left inguinal hernia containing fat and moderate enlargement of the prostate gland. No evidence of pelvic hematoma. IMPRESSION: 1. Acute, minimally impacted and minimally displaced subcapital fracture of the left femoral neck. 2. No evidence of left pelvic fracture or significant left hip arthropathy. Electronically Signed   By: Richardean Sale M.D.   On: 09/08/2018 14:12   Dg Chest Portable 1 View  Result Date: 09/17/2018 CLINICAL DATA:  73 year old male with a history of fall and weakness EXAM: PORTABLE CHEST 1 VIEW COMPARISON:  May 20, 2018 FINDINGS: Cardiomediastinal silhouette unchanged in size and contour with cardiomegaly. Calcifications of the aortic arch. Reticular opacities throughout the lungs with improved aeration compared to the prior. No pleural effusion or pneumothorax. No confluent airspace disease. No visualized displaced fracture IMPRESSION: Improved aeration compared to the prior plain film with persisting reticular pattern of opacity most likely representing chronic interstitial lung disease. Electronically Signed   By: Corrie Mckusick D.O.   On: 09/19/2018 12:26   Dg Femur Min 2 Views Left  Result Date: 09/10/2018 CLINICAL DATA:  Fall yesterday.  Inability to stand or bear weight. EXAM: LEFT FEMUR 2 VIEWS COMPARISON:  Pelvic and right hip radiographs 05/14/2008 FINDINGS: The bones are mildly demineralized. There is no evidence of acute fracture or dislocation. There are mild tricompartmental degenerative changes at the left knee. Mild vascular calcifications are noted. IMPRESSION: No evidence of acute left femoral injury. Electronically Signed   By: Richardean Sale M.D.   On: 09/06/2018 12:24    Pending Labs Unresulted Labs (From admission, onward)    Start     Ordered    08/31/18 1518  SARS Coronavirus 2 [facility] order, Performed in [facility] hospital lab) Nasopharyngeal Nasopharyngeal Swab  (Symptomatic/High Risk of Exposure/Tier 1 Patients Labs with Precautions)  Once,   STAT    Question Answer Comment  Is this test for diagnosis or screening Diagnosis of ill patient   Symptomatic for COVID-19 as defined by CDC Yes   Date of Symptom Onset 08/30/2018   Hospitalized for COVID-19 No   Admitted to ICU for COVID-19 No   Previously tested for COVID-19 No   Resident in a congregate (group) care setting No   Employed in healthcare setting No      09/14/2018 1518   09/20/2018 1114  Urinalysis, Complete w Microscopic  ONCE - STAT,   STAT     09/15/2018 1113   Signed and Held  CBC  Tomorrow morning,   R     Signed and Held   Signed and Held  Basic metabolic panel  Tomorrow morning,   R     Signed and Held          Vitals/Pain Today's Vitals   09/02/2018 1330 09/21/2018 1400 09/06/2018 1430 08/31/18 1500  BP: 140/85 128/71 136/83 140/82  Pulse: (!) 105 (!) 101 (!) 102 (!) 101  Resp: 17 (!) 22 (!) 23 18  Temp:      TempSrc:      SpO2: 97% 94% 92% 91%  Weight:      Height:      PainSc:        Isolation Precautions No active isolations  Medications Medications - No data to display  Mobility walks with device     Focused Assessments Neuro Assessment Handoff:   Cardiac Rhythm: Sinus tachycardia       Neuro Assessment: Within Defined Limits(A&O x4)        R Recommendations: See Admitting Provider Note  Report given to:   Additional Notes:

## 2018-09-01 ENCOUNTER — Inpatient Hospital Stay
Admit: 2018-09-01 | Discharge: 2018-09-01 | Disposition: A | Discharge status: Home / Self Care | Payer: Medicare Other | Attending: Adult Health | Admitting: Adult Health

## 2018-09-01 ENCOUNTER — Encounter: Admission: EM | Disposition: E | Payer: Self-pay | Source: Home / Self Care | Attending: Internal Medicine

## 2018-09-01 ENCOUNTER — Inpatient Hospital Stay: Payer: Medicare Other

## 2018-09-01 DIAGNOSIS — I469 Cardiac arrest, cause unspecified: Secondary | ICD-10-CM

## 2018-09-01 LAB — CBC
HCT: 36 % — ABNORMAL LOW (ref 39.0–52.0)
Hemoglobin: 11.5 g/dL — ABNORMAL LOW (ref 13.0–17.0)
MCH: 29.6 pg (ref 26.0–34.0)
MCHC: 31.9 g/dL (ref 30.0–36.0)
MCV: 92.8 fL (ref 80.0–100.0)
Platelets: 158 10*3/uL (ref 150–400)
RBC: 3.88 MIL/uL — ABNORMAL LOW (ref 4.22–5.81)
RDW: 16.3 % — ABNORMAL HIGH (ref 11.5–15.5)
WBC: 21.5 10*3/uL — ABNORMAL HIGH (ref 4.0–10.5)
nRBC: 0 % (ref 0.0–0.2)

## 2018-09-01 LAB — ECHOCARDIOGRAM COMPLETE
Height: 77 in
Weight: 3432.12 oz

## 2018-09-01 LAB — GLUCOSE, CAPILLARY
Glucose-Capillary: 86 mg/dL (ref 70–99)
Glucose-Capillary: 126 mg/dL — ABNORMAL HIGH (ref 70–99)

## 2018-09-01 LAB — PHOSPHORUS: Phosphorus: 4.5 mg/dL (ref 2.5–4.6)

## 2018-09-01 LAB — BASIC METABOLIC PANEL
Anion gap: 12 (ref 5–15)
BUN: 34 mg/dL — ABNORMAL HIGH (ref 8–23)
CO2: 24 mmol/L (ref 22–32)
Calcium: 8.7 mg/dL — ABNORMAL LOW (ref 8.9–10.3)
Chloride: 102 mmol/L (ref 98–111)
Creatinine, Ser: 3.71 mg/dL — ABNORMAL HIGH (ref 0.61–1.24)
GFR calc Af Amer: 17 mL/min — ABNORMAL LOW (ref >60)
GFR calc non Af Amer: 15 mL/min — ABNORMAL LOW (ref >60)
Glucose, Bld: 130 mg/dL — ABNORMAL HIGH (ref 70–99)
Potassium: 3.5 mmol/L (ref 3.5–5.1)
Sodium: 138 mmol/L (ref 135–145)

## 2018-09-01 LAB — MRSA PCR SCREENING: MRSA by PCR: NEGATIVE

## 2018-09-01 LAB — TROPONIN I (HIGH SENSITIVITY)
Troponin I (High Sensitivity): 58 ng/L — ABNORMAL HIGH (ref <18)
Troponin I (High Sensitivity): 103 ng/L (ref <18)

## 2018-09-01 LAB — LACTIC ACID, PLASMA: Lactic Acid, Venous: 2.2 mmol/L (ref 0.5–1.9)

## 2018-09-01 SURGERY — OPEN REDUCTION INTERNAL FIXATION HIP
Anesthesia: Spinal | Laterality: Left

## 2018-09-01 MED ORDER — NOREPINEPHRINE 16 MG/250ML-% IV SOLN
0.0000 ug/min | INTRAVENOUS | Status: DC
  Filled 2018-09-01: qty 250

## 2018-09-01 MED ORDER — SODIUM CHLORIDE 0.9 % IV SOLN
0.0000 ug/min | INTRAVENOUS | Status: DC
  Administered 2018-09-01: 09:00:00 0 ug/min via INTRAVENOUS
  Filled 2018-09-01: qty 40

## 2018-09-01 MED ORDER — SODIUM CHLORIDE 0.9 % IV SOLN
0.0000 ug/min | INTRAVENOUS | Status: DC
  Filled 2018-09-01: qty 1

## 2018-09-01 MED ORDER — FAMOTIDINE IN NACL 20-0.9 MG/50ML-% IV SOLN: 20.0000 mg | INTRAVENOUS | Status: DC

## 2018-09-01 MED ORDER — HEPARIN SODIUM (PORCINE) 1000 UNIT/ML IJ SOLN
1000.0000 [IU] | Freq: Once | INTRAMUSCULAR | Status: AC
  Administered 2018-09-01: 1400 [IU] via INTRAVENOUS
  Filled 2018-09-01: qty 1

## 2018-09-01 MED ORDER — NOREPINEPHRINE 16 MG/250ML-% IV SOLN
0.0000 ug/min | INTRAVENOUS | Status: DC
  Administered 2018-09-01: 40 ug/min via INTRAVENOUS
  Filled 2018-09-01: qty 250

## 2018-09-01 MED ORDER — MORPHINE 100MG IN NS 100ML (1MG/ML) PREMIX INFUSION
10.0000 mg/h | INTRAVENOUS | Status: DC
  Administered 2018-09-01: 10 mg/h via INTRAVENOUS
  Filled 2018-09-01: qty 100

## 2018-09-01 MED ORDER — LORAZEPAM 2 MG/ML IJ SOLN
2.0000 mg | INTRAMUSCULAR | Status: DC | PRN
  Administered 2018-09-01 (×2): 2 mg via INTRAVENOUS
  Filled 2018-09-01 (×3): qty 1

## 2018-09-01 MED ORDER — PANTOPRAZOLE SODIUM 40 MG IV SOLR: 40.0000 mg | INTRAVENOUS | Status: DC

## 2018-09-01 MED FILL — Medication: Qty: 1 | Status: AC

## 2018-09-01 SURGICAL SUPPLY — 29 items
BLADE SURG SZ10 CARB STEEL (BLADE) ×3 IMPLANT
BNDG COHESIVE 6X5 TAN STRL LF (GAUZE/BANDAGES/DRESSINGS) ×6 IMPLANT
CANISTER SUCT 1200ML W/VALVE (MISCELLANEOUS) ×3 IMPLANT
COVER WAND RF STERILE (DRAPES) ×3 IMPLANT
DRAPE SURG 17X11 SM STRL (DRAPES) ×6 IMPLANT
DRAPE U-SHAPE 47X51 STRL (DRAPES) ×3 IMPLANT
DRSG OPSITE POSTOP 3X4 (GAUZE/BANDAGES/DRESSINGS) IMPLANT
DRSG OPSITE POSTOP 4X6 (GAUZE/BANDAGES/DRESSINGS) IMPLANT
DURAPREP 26ML APPLICATOR (WOUND CARE) ×6 IMPLANT
ELECT REM PT RETURN 9FT ADLT (ELECTROSURGICAL) ×3
ELECTRODE REM PT RTRN 9FT ADLT (ELECTROSURGICAL) ×1 IMPLANT
GAUZE SPONGE 4X4 12PLY STRL (GAUZE/BANDAGES/DRESSINGS) ×3 IMPLANT
GLOVE BIOGEL PI IND STRL 9 (GLOVE) ×1 IMPLANT
GLOVE BIOGEL PI INDICATOR 9 (GLOVE) ×2
GLOVE SURG 9.0 ORTHO LTXF (GLOVE) ×6 IMPLANT
GOWN STRL REUS TWL 2XL XL LVL4 (GOWN DISPOSABLE) ×3 IMPLANT
GOWN STRL REUS W/ TWL LRG LVL3 (GOWN DISPOSABLE) ×1 IMPLANT
GOWN STRL REUS W/TWL LRG LVL3 (GOWN DISPOSABLE) ×2
HOLDER FOLEY CATH W/STRAP (MISCELLANEOUS) IMPLANT
MAT ABSORB  FLUID 56X50 GRAY (MISCELLANEOUS) ×2
MAT ABSORB FLUID 56X50 GRAY (MISCELLANEOUS) ×1 IMPLANT
NS IRRIG 1000ML POUR BTL (IV SOLUTION) ×3 IMPLANT
PACK HIP COMPR (MISCELLANEOUS) ×3 IMPLANT
STAPLER SKIN PROX 35W (STAPLE) ×3 IMPLANT
STRAP SAFETY 5IN WIDE (MISCELLANEOUS) ×3 IMPLANT
SUT VIC AB 0 CT1 36 (SUTURE) ×3 IMPLANT
SUT VIC AB 2-0 CT2 27 (SUTURE) ×3 IMPLANT
SUT VICRYL 0 AB UR-6 (SUTURE) ×6 IMPLANT
TRAY FOLEY MTR SLVR 16FR STAT (SET/KITS/TRAYS/PACK) IMPLANT

## 2018-09-06 ENCOUNTER — Other Ambulatory Visit: Payer: Medicare Other | Admitting: Urology

## 2018-09-06 LAB — BLOOD GAS, ARTERIAL
Acid-base deficit: 0.7 mmol/L (ref 0.0–2.0)
Bicarbonate: 25.8 mmol/L (ref 20.0–28.0)
FIO2: 1
MECHVT: 500 mL
O2 Saturation: 99.9 %
PEEP: 10 cmH2O
Patient temperature: 37
RATE: 24 resp/min
pCO2 arterial: 49 mmHg — ABNORMAL HIGH (ref 32.0–48.0)
pH, Arterial: 7.33 — ABNORMAL LOW (ref 7.350–7.450)
pO2, Arterial: 275 mmHg — ABNORMAL HIGH (ref 83.0–108.0)

## 2018-09-20 ENCOUNTER — Ambulatory Visit: Payer: Medicare Other | Admitting: Podiatry

## 2018-09-24 NOTE — Progress Notes (Signed)
RR increased to 30 Fio2 decreased to 50

## 2018-09-24 NOTE — Progress Notes (Signed)
1208 Patient expired with family at bedside.

## 2018-09-24 NOTE — Progress Notes (Signed)
Nutrition Brief Note  Patient identified to be seen for new vent assessment. Chart reviewed. Patient now transitioning to comfort care.   No nutrition interventions warranted at this time. Please consult RD as needed.   Willey Blade, MS, [place], LDN Office: [phone] Pager: [phone] After Hours/Weekend Pager: [phone]

## 2018-09-24 NOTE — Death Summary Note (Signed)
DEATH SUMMARY   Patient Details  Name: Joseph Newman MRN: [id number] DOB: [birth year]  Admission/Discharge Information   Admit Date:  09/26/18  Date of Death: Date of Death: 09/27/2018  Time of Death: Time of Death: 1206/05/14  Length of Stay: 1  Referring Physician: Steele Sizer, MD   Reason(s) for Hospitalization  Left hip fracture  Diagnoses  Preliminary cause of death: ischemic cardiomyopathy, left hip fracture Secondary Diagnoses (including complications and co-morbidities):  Active Problems:   Closed left hip fracture (HCC)   Acute respiratory failure with hypoxia [facility])   Brief Hospital Course (including significant findings, care, treatment, and services provided and events leading to death)  Joseph Newman is a 73 y.o. year old male who transferred to ICU for acute resp failure, cardiac failure and cardiogenic shock, s/p cardiac arrest  Family At bedside, clinical status relayed to family  Updated and notified of patients medical condition-  Progressive multiorgan failure with very low chance of meaningful recovery.  Patient is in dying  Process.  Family understands the situation.  They have consented and agreed to DNR and would like to proceed with Comfort care measures.   Family are satisfied with Plan of action and management. All questions answered          Pertinent Labs and Studies  Significant Diagnostic Studies Dg Pelvis 1-2 Views  Result Date: September 26, 2018 CLINICAL DATA:  Fall yesterday. Inability to stand or bear weight. EXAM: PELVIS - 1-2 VIEW COMPARISON:  Pelvic and right hip radiographs 05/15/2018. Pelvic CT 07/31/2018. FINDINGS: The bones are mildly demineralized. Previous right femoral neck pinning with intact hardware. No evidence of acute fracture or dislocation. The sacroiliac joints and symphysis pubis are intact IMPRESSION: No evidence of acute pelvic fracture. Previous right femoral neck pinning. Electronically Signed   By: Richardean Sale M.D.    On: 2018/09/26 12:27   Dg Tibia/fibula Left  Result Date: 09-26-2018 CLINICAL DATA:  Fall yesterday. Inability to stand or bear weight. EXAM: LEFT TIBIA AND FIBULA - 2 VIEW COMPARISON:  None. FINDINGS: The bones are demineralized. No evidence of acute fracture or dislocation. Possible mild soft tissue swelling medially in the ankle. No foreign body. IMPRESSION: No evidence of acute left lower leg injury. Electronically Signed   By: Richardean Sale M.D.   On: 09/26/18 12:25   Dg Abd 1 View  Result Date: 2018/09/26 CLINICAL DATA:  OG tube placement EXAM: ABDOMEN - 1 VIEW COMPARISON:  None. FINDINGS: There is an OG tube extends into the stomach and is coiled several times. The tip projects over the gastric body. The visualized bowel gas pattern is nonspecific and nonobstructive. IMPRESSION: OG tube projects over the stomach. Electronically Signed   By: Constance Holster M.D.   On: 09-26-2018 22:53   Ct Hip Left Wo Contrast  Result Date: 09/26/18 CLINICAL DATA:  Hip pain since falling yesterday. EXAM: CT OF THE LEFT HIP WITHOUT CONTRAST TECHNIQUE: Multidetector CT imaging of the left hip was performed according to the standard protocol. Multiplanar CT image reconstructions were also generated. COMPARISON:  Radiographs 2018-09-26. pelvic CT 07/31/2018. FINDINGS: Bones/Joint/Cartilage The bones appear demineralized. There is a minimally impacted and minimally displaced subcapital fracture of the left femoral neck. The femoral head is intact and located. No evidence of left pelvic fracture or significant left hip arthropathy. There is a small left hip joint effusion. Ligaments Suboptimally assessed by CT. Muscles and Tendons The left hip muscles and tendons appear intact. There is a probable trochanteric bursal fluid collection lateral  to the greater trochanter, measuring 5.6 cm on coronal image 55/6, similar to previous CT. No focal hematoma. Soft tissues There is a left inguinal hernia containing fat and  moderate enlargement of the prostate gland. No evidence of pelvic hematoma. IMPRESSION: 1. Acute, minimally impacted and minimally displaced subcapital fracture of the left femoral neck. 2. No evidence of left pelvic fracture or significant left hip arthropathy. Electronically Signed   By: Richardean Sale M.D.   On: 08/24/2018 14:12   Dg Chest Port 1 View  Result Date: 09-11-2018 CLINICAL DATA:  Respiratory failure EXAM: PORTABLE CHEST 1 VIEW COMPARISON:  Chest radiograph from one day prior. FINDINGS: Endotracheal tube tip is 6.0 cm above the carina. Left internal jugular central venous catheter terminates in the upper third of the SVC. Enteric tube enters stomach with the tip not seen on this image. Stable cardiomediastinal silhouette with normal heart size. No pneumothorax. No pleural effusion. No overt pulmonary edema. Stable mild reticular opacities at the lung bases. IMPRESSION: 1. Well-positioned support structures.  No pneumothorax. 2. No overt pulmonary edema. 3. Stable mild reticular opacities at the lung bases, favor mild atelectasis. Electronically Signed   By: Ilona Sorrel M.D.   On: 09/11/2018 07:35   Portable Chest X-ray  Result Date: 09/10/2018 CLINICAL DATA:  Check endotracheal tube placement EXAM: PORTABLE CHEST 1 VIEW COMPARISON:  Film from earlier in the same day. FINDINGS: Endotracheal tube, gastric catheter and left jugular temporary dialysis catheter are noted in satisfactory position. No pneumothorax is noted. Cardiac shadow is stable. Diffuse vascular congestion and mild pulmonary edema is again seen. No bony abnormality is noted. IMPRESSION: Tubes and lines as described above.  No pneumothorax is noted. Stable pulmonary edema. Electronically Signed   By: Inez Catalina M.D.   On: 09/06/2018 22:31   Dg Chest Port 1 View  Result Date: 08/29/2018 CLINICAL DATA:  Tachycardia.  Low O2 sats. EXAM: PORTABLE CHEST 1 VIEW COMPARISON:  August 31, 2018 FINDINGS: Cardiomegaly and mild edema.  No  other abnormalities. IMPRESSION: Cardiomegaly.  New mild pulmonary edema. Electronically Signed   By: Dorise Bullion III M.D   On: 08/30/2018 20:27   Dg Chest Portable 1 View  Result Date: 09/13/2018 CLINICAL DATA:  73 year old male with a history of fall and weakness EXAM: PORTABLE CHEST 1 VIEW COMPARISON:  May 20, 2018 FINDINGS: Cardiomediastinal silhouette unchanged in size and contour with cardiomegaly. Calcifications of the aortic arch. Reticular opacities throughout the lungs with improved aeration compared to the prior. No pleural effusion or pneumothorax. No confluent airspace disease. No visualized displaced fracture IMPRESSION: Improved aeration compared to the prior plain film with persisting reticular pattern of opacity most likely representing chronic interstitial lung disease. Electronically Signed   By: Corrie Mckusick D.O.   On: 08/28/2018 12:26   Dg Femur Min 2 Views Left  Result Date: 09/23/2018 CLINICAL DATA:  Fall yesterday.  Inability to stand or bear weight. EXAM: LEFT FEMUR 2 VIEWS COMPARISON:  Pelvic and right hip radiographs 05/14/2008 FINDINGS: The bones are mildly demineralized. There is no evidence of acute fracture or dislocation. There are mild tricompartmental degenerative changes at the left knee. Mild vascular calcifications are noted. IMPRESSION: No evidence of acute left femoral injury. Electronically Signed   By: Richardean Sale M.D.   On: 08/30/2018 12:24    Microbiology Recent Results (from the past 240 hour(s))  SARS Coronavirus 2 [facility] order, Performed in [facility] hospital lab) Nasopharyngeal Nasopharyngeal Swab     Status: None  Collection Time: 08/29/2018  3:28 PM   Specimen: Nasopharyngeal Swab  Result Value Ref Range Status   SARS Coronavirus 2 NEGATIVE NEGATIVE Final    Comment: (NOTE) If result is NEGATIVE SARS-CoV-2 target nucleic acids are NOT DETECTED. The SARS-CoV-2 RNA is generally detectable in upper and lower  respiratory specimens  during the acute phase of infection. The lowest  concentration of SARS-CoV-2 viral copies this assay can detect is 250  copies / mL. A negative result does not preclude SARS-CoV-2 infection  and should not be used as the sole basis for treatment or other  patient management decisions.  A negative result may occur with  improper specimen collection / handling, submission of specimen other  than nasopharyngeal swab, presence of viral mutation(s) within the  areas targeted by this assay, and inadequate number of viral copies  (<250 copies / mL). A negative result must be combined with clinical  observations, patient history, and epidemiological information. If result is POSITIVE SARS-CoV-2 target nucleic acids are DETECTED. The SARS-CoV-2 RNA is generally detectable in upper and lower  respiratory specimens dur ing the acute phase of infection.  Positive  results are indicative of active infection with SARS-CoV-2.  Clinical  correlation with patient history and other diagnostic information is  necessary to determine patient infection status.  Positive results do  not rule out bacterial infection or co-infection with other viruses. If result is PRESUMPTIVE POSTIVE SARS-CoV-2 nucleic acids MAY BE PRESENT.   A presumptive positive result was obtained on the submitted specimen  and confirmed on repeat testing.  While 2019 novel coronavirus  (SARS-CoV-2) nucleic acids may be present in the submitted sample  additional confirmatory testing may be necessary for epidemiological  and / or clinical management purposes  to differentiate between  SARS-CoV-2 and other Sarbecovirus currently known to infect humans.  If clinically indicated additional testing with an alternate test  methodology [phone]) is advised. The SARS-CoV-2 RNA is generally  detectable in upper and lower respiratory sp ecimens during the acute  phase of infection. The expected result is Negative. Fact Sheet for Patients:   StrictlyIdeas.no Fact Sheet for Healthcare Providers: BankingDealers.co.za This test is not yet approved or cleared by the [place] FDA and has been authorized for detection and/or diagnosis of SARS-CoV-2 by FDA under an Emergency Use Authorization (EUA).  This EUA will remain in effect (meaning this test can be used) for the duration of the COVID-19 declaration under Section 564(b)(1) of the Act, 21 U.S.C. section 360bbb-3(b)(1), unless the authorization is terminated or revoked sooner. Performed at [facility], [address] [place], [place] [address]   Surgical PCR screen     Status: Abnormal   Collection Time: 08/31/2018  5:45 PM   Specimen: Nasal Mucosa; Nasal Swab  Result Value Ref Range Status   MRSA, PCR NEGATIVE NEGATIVE Final   Staphylococcus aureus POSITIVE (A) NEGATIVE Final    Comment: (NOTE) The Xpert SA Assay (FDA approved for NASAL specimens in patients 59 years of age and older), is one component of a comprehensive surveillance program. It is not intended to diagnose infection nor to guide or monitor treatment. Performed at [facility], [address] [place],  [address]   MRSA PCR Screening     Status: None   Collection Time: 09-29-2018 12:49 AM   Specimen: Nasopharyngeal  Result Value Ref Range Status   MRSA by PCR NEGATIVE NEGATIVE Final    Comment:        The GeneXpert  MRSA Assay (FDA approved for NASAL specimens only), is one component of a comprehensive MRSA colonization surveillance program. It is not intended to diagnose MRSA infection nor to guide or monitor treatment for MRSA infections. Performed at [facility], [address] [place], [place] [address]     Lab Basic Metabolic Panel: Recent Labs  Lab 08/30/2018 1222 09/11/2018 2110 09/14/2018 2319 Sep 06, 2018 0500  NA 135 135 137 138  K 5.1 5.8* 5.1 3.5  CL 100 100 100 102  CO2 23 19* 24 24  GLUCOSE  131* 180* 332* 130*  BUN 56* 58* 60* 34*  CREATININE 5.98* 6.28* 6.56* 3.71*  CALCIUM 8.4* 8.8* 10.3 8.7*  MG  --  2.1 2.1  --   PHOS  --  6.4* 7.6* 4.5   Liver Function Tests: Recent Labs  Lab 09/06/2018 2110  AST 18  ALT 12  ALKPHOS 93  BILITOT 1.1  PROT 7.6  ALBUMIN 3.5   No results for input(s): LIPASE, AMYLASE in the last 168 hours. No results for input(s): AMMONIA in the last 168 hours. CBC: Recent Labs  Lab 09/10/2018 1222 09/17/2018 1942 09/17/2018 2110 06-Sep-2018 0500  WBC 8.9  --  3.2* 21.5*  NEUTROABS 5.6  --   --   --   HGB 12.1* 13.4 13.0 11.5*  HCT 36.9*  --  41.0 36.0*  MCV 89.3  --  91.5 92.8  PLT 204  --  185 158   Cardiac Enzymes: No results for input(s): CKTOTAL, CKMB, CKMBINDEX, TROPONINI in the last 168 hours. Sepsis Labs: Recent Labs  Lab 09/18/2018 1222 09/06/2018 2110 September 06, 2018 0500  WBC 8.9 3.2* 21.5*  LATICACIDVEN  --  3.8* 2.2*      Recie Cirrincione 09/06/2018, 2:20 PM

## 2018-09-24 NOTE — Progress Notes (Signed)
Ch attended to the family who's loving one was actively dying. Ch consulted with pt nurse who shared that the pt declined suddenly. Ch provided grief and emotional support as they said goodbye to their loved one. The pt was not responsive at the time but did not present to be in any pain. Ch allowed space for the family to reflect on the pt's life. Ch offered a prayer for closure. Ch visited was appreciated by family. No further needs.   09-30-2018 1200  Clinical Encounter Type  Visited With Patient and family together;Health care provider  Visit Type Psychological support;Spiritual support;Social support;Death  Referral From Nurse  Consult/Referral To Chaplain  Spiritual Encounters  Spiritual Needs Emotional;Grief support;Prayer  Stress Factors  Patient Stress Factors Health changes;Major life changes  Family Stress Factors Major life changes

## 2018-09-24 NOTE — Progress Notes (Signed)
Mrs. Lapole notified of patient change in status and encouraged to come to patient bedside.

## 2018-09-24 NOTE — Progress Notes (Signed)
HD Tx End  515mL net fluid removal. (1043mL total).  Pt opens eyes, unable to follow commands.  BP improved to 110/61 post tx.  Pulse remains tachy, improved from 130s pre/mid-tx,  to 110 by tx end.  Labs drawn at end of dialysis tx.    Sep 28, 2018 0515  Hand-Off documentation  Report given to (Full Name) Donnamae Jude RN  Report received from (Full Name) Trellis Paganini RN  Vital Signs  Temp 97.6 F (36.4 C)  Temp Source Axillary  Pulse Rate (!) 110  Pulse Rate Source Monitor  Resp (!) 24  BP 110/61  BP Location Left Arm  BP Method Automatic  Patient Position (if appropriate) Lying  Oxygen Therapy  SpO2 99 %  O2 Device Ventilator  End Tidal CO2 (EtCO2) 39  Pain Assessment  Pain Scale CPOT  Critical Care Pain Observation Tool (CPOT)  Facial Expression 0  Body Movements 0  Muscle Tension 0  Compliance with ventilator (intubated pts.) 0  Vocalization (extubated pts.) N/A  CPOT Total 0  Dialysis Weight  Weight 97.3 kg  Type of Weight Post-Dialysis (estimated post HD weight)  During Hemodialysis Assessment  Blood Flow Rate (mL/min) 200 mL/min  Arterial Pressure (mmHg) -150 mmHg  Venous Pressure (mmHg) 100 mmHg  Transmembrane Pressure (mmHg) 60 mmHg  Ultrafiltration Rate (mL/min) 170 mL/min  Dialysate Flow Rate (mL/min) 600 ml/min  Conductivity: Machine  14  HD Safety Checks Performed Yes  Dialysis Fluid Bolus Normal Saline  Bolus Amount (mL) 250 mL  Intra-Hemodialysis Comments Tx completed  Post-Hemodialysis Assessment  Rinseback Volume (mL) 250 mL  Dialyzer Clearance Lightly streaked  Duration of HD Treatment -hour(s) 3 hour(s)  Hemodialysis Intake (mL) 500 mL  UF Total -Machine (mL) 1000 mL  Net UF (mL) 500 mL  AVG/AVF Arterial Site Held (minutes) 5 minutes  AVG/AVF Venous Site Held (minutes) 5 minutes  Fistula / Graft Right Upper arm Arteriovenous fistula  Placement Date: 05/15/18   Placed prior to admission: Yes  Orientation: Right  Access Location: Upper  arm  Access Type: Arteriovenous fistula  Site Condition No complications  Fistula / Graft Assessment Thrill;Bruit;Present  Status Deaccessed  Drainage Description None

## 2018-09-24 NOTE — Progress Notes (Signed)
CDS notified [id number]   Joseph Newman with CDS

## 2018-09-24 NOTE — Progress Notes (Signed)
Subjective:  73 y.o. male with PMHx of diabetes mellitus presenting today for follow up evaluation of an ulceration of the left heel. He states the wound is improving slowly. He reports some tingling and paresthesias of the bilateral feet, right greater than left, that began 1-2 weeks ago. Home health has been changing his dressings every other day. There are no modifying factors noted. Patient is here for further evaluation and treatment.   Past Medical History:  Diagnosis Date  . Acute metabolic encephalopathy [id number]   Per my chart  . Arthritis   . Chronic airway obstruction, not elsewhere classified    worked in [employer] and was exposed to [employer]  . 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 Holley Raring, nephrologist.  on dialysis tu - thur - sat  . Depressive disorder, not elsewhere classified   . Dialysis patient [facility]) 01/2018   dialysis tuesday - thursday - saturday  . Gout   . Gout   . Heart murmur    followed by dr. Clayborn Bigness  . Hyperlipidemia   . Hypertension   . Hypertrophy of prostate without urinary obstruction and other lower urinary tract symptoms (LUTS)   . Impotence of organic origin   . Microalbuminuria   . 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 Obesity, unspecified   . Plantar wart   . Sleep apnea    years ago but never used a cpap  . Synovitis and tenosynovitis, unspecified   . Type II or unspecified type diabetes mellitus without mention of complication, uncontrolled   . Unspecified venous (peripheral) insufficiency   . UTI (urinary tract infection)    frequent with ESBL      Objective/Physical Exam General: The patient is alert and oriented x3 in no acute distress.  Dermatology:  Wound #1 noted to the left heel measuring 2.5 x 2.0 x 0.1 cm (LxWxD).   To the noted ulceration(s), there is no eschar. There is a moderate amount of slough, fibrin, and necrotic tissue noted. Granulation tissue and wound base is  red. There is a minimal amount of serosanguineous drainage noted. There is no exposed bone muscle-tendon ligament or joint. There is no malodor. Periwound integrity is intact. Skin is warm, dry and supple bilateral lower extremities.  Vascular: Palpable pedal pulses bilaterally. No edema or erythema noted. Capillary refill within normal limits.  Neurological: Epicritic and protective threshold diminished bilaterally.   Musculoskeletal Exam: Range of motion within normal limits to all pedal and ankle joints bilateral. Muscle strength 5/5 in all groups bilateral.   Assessment: 1. 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 Patient was evaluated. 2. medically necessary excisional debridement including subcutaneous tissue was performed using a tissue nipper and a chisel blade. Excisional debridement of all the necrotic nonviable tissue down to healthy bleeding viable tissue was performed with post-debridement measurements same as pre-. 3. the wound was cleansed and dry sterile dressing applied. 4. Continue home health care dressing changes every other day.  5. Prescription for Gabapentin 100 mg TID provided to patient.  6. patient is to return to clinic in 3 weeks.   Edrick Kins, DPM [facility]  Dr. Edrick Kins, DPM    [address]  [place], [place] [address]                Office [phone]  Fax [phone]

## 2018-09-24 NOTE — Progress Notes (Addendum)
Late Note- Dr. Mortimer Fries discussed patient's code status with wife. Explained poor prognosis and quality of life that patient would have. Wife decided to make patient DNR and Comfort Care.

## 2018-09-24 NOTE — Progress Notes (Signed)
[facility], [place] 09/28/18  Subjective:   Patient is doing poorly Wife at bedside Came in because all from his walker.  Diagnosed with left femoral neck fracture with minimal displacement.  Intubated for respiratory distress yesterday evening.  Emergent hemodialysis done.  500 cc of volume was removed Having issues with blood from the penis after patient pulled out Foley catheter   Objective:  Vital signs in last 24 hours:  Temp:  [97.6 F (36.4 C)-102.3 F (39.1 C)] 97.7 F (36.5 C) (08/09 0629) Pulse Rate:  [61-214] 109 (08/09 0530) Resp:  [0-44] 22 (08/09 0629) BP: (66-164)/(50-138) 66/50 (08/09 0629) SpO2:  [77 %-100 %] 99 % (08/09 0530) FiO2 (%):  [50 %-100 %] 50 % (08/09 0400) Weight:  [97.3 kg-97.8 kg] 97.3 kg (08/09 0515)  Weight change:  Filed Weights   09/11/2018 0948 28-Sep-2018 0205 2018-09-28 0515  Weight: 97.8 kg 97.8 kg 97.3 kg    Intake/Output:    Intake/Output Summary (Last 24 hours) at 2018/09/28 0721 Last data filed at September 28, 2018 0515 Gross per 24 hour  Intake 852.21 ml  Output 501 ml  Net 351.21 ml     Physical Exam: General: Critically ill appearing, lying in the bed  HEENT Anicteric, moist oral mucous membranes  Neck No masses  Pulm/lungs Ventilator assisted, FiO2 50%, coarse  CVS/Heart Irregular, tachycardic  Abdomen:  Soft, nontender  Extremities: Trace edema  Neurologic: Sedated  Skin: No acute rashes  Access: Left IJ temporary catheter, right arm access       Basic Metabolic Panel:  Recent Labs  Lab 08/26/2018 1222 09/06/2018 2110 09/11/2018 2319 Sep 28, 2018 0500  NA 135 135 137 138  K 5.1 5.8* 5.1 3.5  CL 100 100 100 102  CO2 23 19* 24 24  GLUCOSE 131* 180* 332* 130*  BUN 56* 58* 60* 34*  CREATININE 5.98* 6.28* 6.56* 3.71*  CALCIUM 8.4* 8.8* 10.3 8.7*  MG  --  2.1 2.1  --   PHOS  --  6.4* 7.6* 4.5     CBC: Recent Labs  Lab 09/05/2018 1222 09/19/2018 1942 08/30/2018 2110 2018-09-28 0500  WBC 8.9  --  3.2*  21.5*  NEUTROABS 5.6  --   --   --   HGB 12.1* 13.4 13.0 11.5*  HCT 36.9*  --  41.0 36.0*  MCV 89.3  --  91.5 92.8  PLT 204  --  185 158      Lab Results  Component Value Date   HEPBSAG Negative 05/22/2018   HEPBSAG Negative 05/22/2018   HEPBIGM Negative 05/22/2018      Microbiology:  Recent Results (from the past 240 hour(s))  SARS Coronavirus 2 [facility] order, Performed in [facility] hospital lab) Nasopharyngeal Nasopharyngeal Swab     Status: None   Collection Time: 09/10/2018  3:28 PM   Specimen: Nasopharyngeal Swab  Result Value Ref Range Status   SARS Coronavirus 2 NEGATIVE NEGATIVE Final    Comment: (NOTE) If result is NEGATIVE SARS-CoV-2 target nucleic acids are NOT DETECTED. The SARS-CoV-2 RNA is generally detectable in upper and lower  respiratory specimens during the acute phase of infection. The lowest  concentration of SARS-CoV-2 viral copies this assay can detect is 250  copies / mL. A negative result does not preclude SARS-CoV-2 infection  and should not be used as the sole basis for treatment or other  patient management decisions.  A negative result may occur with  improper specimen collection / handling, submission of specimen other  than nasopharyngeal swab, presence of viral mutation(s) within the  areas targeted by this assay, and inadequate number of viral copies  (<250 copies / mL). A negative result must be combined with clinical  observations, patient history, and epidemiological information. If result is POSITIVE SARS-CoV-2 target nucleic acids are DETECTED. The SARS-CoV-2 RNA is generally detectable in upper and lower  respiratory specimens dur ing the acute phase of infection.  Positive  results are indicative of active infection with SARS-CoV-2.  Clinical  correlation with patient history and other diagnostic information is  necessary to determine patient infection status.  Positive results do  not rule out bacterial infection or  co-infection with other viruses. If result is PRESUMPTIVE POSTIVE SARS-CoV-2 nucleic acids MAY BE PRESENT.   A presumptive positive result was obtained on the submitted specimen  and confirmed on repeat testing.  While 2019 novel coronavirus  (SARS-CoV-2) nucleic acids may be present in the submitted sample  additional confirmatory testing may be necessary for epidemiological  and / or clinical management purposes  to differentiate between  SARS-CoV-2 and other Sarbecovirus currently known to infect humans.  If clinically indicated additional testing with an alternate test  methodology [phone]) is advised. The SARS-CoV-2 RNA is generally  detectable in upper and lower respiratory sp ecimens during the acute  phase of infection. The expected result is Negative. Fact Sheet for Patients:  StrictlyIdeas.no Fact Sheet for Healthcare Providers: BankingDealers.co.za This test is not yet approved or cleared by the [place] FDA and has been authorized for detection and/or diagnosis of SARS-CoV-2 by FDA under an Emergency Use Authorization (EUA).  This EUA will remain in effect (meaning this test can be used) for the duration of the COVID-19 declaration under Section 564(b)(1) of the Act, 21 U.S.C. section 360bbb-3(b)(1), unless the authorization is terminated or revoked sooner. Performed at [facility], [address] [place], [place] [address]   Surgical PCR screen     Status: Abnormal   Collection Time: 09/10/2018  5:45 PM   Specimen: Nasal Mucosa; Nasal Swab  Result Value Ref Range Status   MRSA, PCR NEGATIVE NEGATIVE Final   Staphylococcus aureus POSITIVE (A) NEGATIVE Final    Comment: (NOTE) The Xpert SA Assay (FDA approved for NASAL specimens in patients 54 years of age and older), is one component of a comprehensive surveillance program. It is not intended to diagnose infection nor to guide or monitor  treatment. Performed at [facility], [address] [place], [place] [address]   MRSA PCR Screening     Status: None   Collection Time: 09-12-18 12:49 AM   Specimen: Nasopharyngeal  Result Value Ref Range Status   MRSA by PCR NEGATIVE NEGATIVE Final    Comment:        The GeneXpert MRSA Assay (FDA approved for NASAL specimens only), is one component of a comprehensive MRSA colonization surveillance program. It is not intended to diagnose MRSA infection nor to guide or monitor treatment for MRSA infections. Performed at [facility], [address] [place], [place] [address]     Coagulation Studies: Recent Labs    09/06/2018 1832  LABPROT 14.6  INR 1.2    Urinalysis: No results for input(s): COLORURINE, LABSPEC, PHURINE, GLUCOSEU, HGBUR, BILIRUBINUR, KETONESUR, PROTEINUR, UROBILINOGEN, NITRITE, LEUKOCYTESUR in the last 72 hours.  Invalid input(s): APPERANCEUR    Imaging: Dg Pelvis 1-2 Views  Result Date: 09/17/2018 CLINICAL DATA:  Fall yesterday. Inability to stand or bear weight. EXAM: PELVIS - 1-2 VIEW  COMPARISON:  Pelvic and right hip radiographs 05/15/2018. Pelvic CT 07/31/2018. FINDINGS: The bones are mildly demineralized. Previous right femoral neck pinning with intact hardware. No evidence of acute fracture or dislocation. The sacroiliac joints and symphysis pubis are intact IMPRESSION: No evidence of acute pelvic fracture. Previous right femoral neck pinning. Electronically Signed   By: Richardean Sale M.D.   On: 08/25/2018 12:27   Dg Tibia/fibula Left  Result Date: 09/13/2018 CLINICAL DATA:  Fall yesterday. Inability to stand or bear weight. EXAM: LEFT TIBIA AND FIBULA - 2 VIEW COMPARISON:  None. FINDINGS: The bones are demineralized. No evidence of acute fracture or dislocation. Possible mild soft tissue swelling medially in the ankle. No foreign body. IMPRESSION: No evidence of acute left lower leg injury. Electronically Signed   By: Richardean Sale M.D.   On: 09/07/2018 12:25   Dg Abd 1 View  Result Date: 09/22/2018 CLINICAL DATA:  OG tube placement EXAM: ABDOMEN - 1 VIEW COMPARISON:  None. FINDINGS: There is an OG tube extends into the stomach and is coiled several times. The tip projects over the gastric body. The visualized bowel gas pattern is nonspecific and nonobstructive. IMPRESSION: OG tube projects over the stomach. Electronically Signed   By: Constance Holster M.D.   On: 09/12/2018 22:53   Ct Hip Left Wo Contrast  Result Date: 09/18/2018 CLINICAL DATA:  Hip pain since falling yesterday. EXAM: CT OF THE LEFT HIP WITHOUT CONTRAST TECHNIQUE: Multidetector CT imaging of the left hip was performed according to the standard protocol. Multiplanar CT image reconstructions were also generated. COMPARISON:  Radiographs 09/02/2018. pelvic CT 07/31/2018. FINDINGS: Bones/Joint/Cartilage The bones appear demineralized. There is a minimally impacted and minimally displaced subcapital fracture of the left femoral neck. The femoral head is intact and located. No evidence of left pelvic fracture or significant left hip arthropathy. There is a small left hip joint effusion. Ligaments Suboptimally assessed by CT. Muscles and Tendons The left hip muscles and tendons appear intact. There is a probable trochanteric bursal fluid collection lateral to the greater trochanter, measuring 5.6 cm on coronal image 55/6, similar to previous CT. No focal hematoma. Soft tissues There is a left inguinal hernia containing fat and moderate enlargement of the prostate gland. No evidence of pelvic hematoma. IMPRESSION: 1. Acute, minimally impacted and minimally displaced subcapital fracture of the left femoral neck. 2. No evidence of left pelvic fracture or significant left hip arthropathy. Electronically Signed   By: Richardean Sale M.D.   On: 09/21/2018 14:12   Portable Chest X-ray  Result Date: 08/25/2018 CLINICAL DATA:  Check endotracheal tube placement EXAM: PORTABLE  CHEST 1 VIEW COMPARISON:  Film from earlier in the same day. FINDINGS: Endotracheal tube, gastric catheter and left jugular temporary dialysis catheter are noted in satisfactory position. No pneumothorax is noted. Cardiac shadow is stable. Diffuse vascular congestion and mild pulmonary edema is again seen. No bony abnormality is noted. IMPRESSION: Tubes and lines as described above.  No pneumothorax is noted. Stable pulmonary edema. Electronically Signed   By: Inez Catalina M.D.   On: 09/06/2018 22:31   Dg Chest Port 1 View  Result Date: 09/21/2018 CLINICAL DATA:  Tachycardia.  Low O2 sats. EXAM: PORTABLE CHEST 1 VIEW COMPARISON:  August 31, 2018 FINDINGS: Cardiomegaly and mild edema.  No other abnormalities. IMPRESSION: Cardiomegaly.  New mild pulmonary edema. Electronically Signed   By: Dorise Bullion III M.D   On: 09/20/2018 20:27   Dg Chest Portable 1 View  Result Date: 09/14/2018  CLINICAL DATA:  73 year old male with a history of fall and weakness EXAM: PORTABLE CHEST 1 VIEW COMPARISON:  May 20, 2018 FINDINGS: Cardiomediastinal silhouette unchanged in size and contour with cardiomegaly. Calcifications of the aortic arch. Reticular opacities throughout the lungs with improved aeration compared to the prior. No pleural effusion or pneumothorax. No confluent airspace disease. No visualized displaced fracture IMPRESSION: Improved aeration compared to the prior plain film with persisting reticular pattern of opacity most likely representing chronic interstitial lung disease. Electronically Signed   By: Corrie Mckusick D.O.   On: 09/15/2018 12:26   Dg Femur Min 2 Views Left  Result Date: 09/11/2018 CLINICAL DATA:  Fall yesterday.  Inability to stand or bear weight. EXAM: LEFT FEMUR 2 VIEWS COMPARISON:  Pelvic and right hip radiographs 05/14/2008 FINDINGS: The bones are mildly demineralized. There is no evidence of acute fracture or dislocation. There are mild tricompartmental degenerative changes at the left  knee. Mild vascular calcifications are noted. IMPRESSION: No evidence of acute left femoral injury. Electronically Signed   By: Richardean Sale M.D.   On: 08/30/2018 12:24     Medications:   . sodium chloride Stopped (09/19/2018 1849)  .  ceFAZolin (ANCEF) IV    . fentaNYL infusion INTRAVENOUS 50 mcg/hr (25-Sep-2018 0300)  . norepinephrine (LEVOPHED) Adult infusion 40 mcg/min (2018-09-25 0605)  . norepinephrine (LEVOPHED) Adult infusion    . phenylephrine (NEO-SYNEPHRINE) Adult infusion    . vasopressin (PITRESSIN) infusion - *FOR SHOCK* 0.03 Units/min (09/25/18 0300)   . allopurinol  50 mg Oral QODAY  . irbesartan  300 mg Oral Daily   And  . amLODipine  10 mg Oral Daily   And  . hydrochlorothiazide  25 mg Oral Daily  . atorvastatin  40 mg Oral Daily  . calcitRIOL  0.25 mcg Oral Daily  . carvedilol  6.25 mg Oral BID  . chlorhexidine gluconate (MEDLINE KIT)  15 mL Mouth Rinse BID  . Chlorhexidine Gluconate Cloth  6 each Topical Q0600  . cholecalciferol  2,000 Units Oral Daily  . docusate sodium  100 mg Oral Daily  . gabapentin  100 mg Oral TID  . hydrocortisone sod succinate (SOLU-CORTEF) inj  50 mg Intravenous Q6H  . insulin aspart  0-15 Units Subcutaneous Q4H  . mouth rinse  15 mL Mouth Rinse 10 times per day  . pantoprazole (PROTONIX) IV  40 mg Intravenous Q24H  . sevelamer carbonate  800 mg Oral TID WC  . tamsulosin  0.4 mg Oral QPC breakfast   acetaminophen **OR** acetaminophen, fentaNYL, HYDROcodone-acetaminophen, midazolam, midazolam, ondansetron **OR** ondansetron (ZOFRAN) IV  Assessment/ Plan:  73 y.o. African-American male With end-stage renal disease, depression, gout, BPH, history of urinary retention presents after a fall and was diagnosed with left femoral neck fracture  1.  End-stage renal disease 2.  Hyperkalemia 3.  Hypotension 4.  Acute respiratory failure 5.  Left femoral neck fracture  Urgent hemodialysis done last night.  Patient is hemodynamically unstable.   Only 500 cc of volume was removed. Currently remains Ventilator assisted We will continue to follow closely    LOS: [place] 2020/09/027:21 AM  [place], [place]  Note: This note was prepared with Dragon dictation. Any transcription errors are unintentional

## 2018-09-24 NOTE — Progress Notes (Signed)
Mid-HD Tx Note --  Ultrafiltration paused / no longer removing fluid - d/t hypotension and continued decline in BP, as well as PVC's increasing in frequency, with HR ranging from 120's to 150's. Also unable to safely increase BFR beyond 239mL/minute for this reason.   Pt remains diaphoretic during tx, afebrile after multiple temperature checks.

## 2018-09-24 NOTE — Progress Notes (Addendum)
HD Tx Start   Hemodialysis initiated via right upper arm AV fistula, using short needles (d/t tortuous nature of the access). Blood flow rate to be gradually increased as able, d/t recent PEA arrest. MD ordered 1.5-2.0kg fluid removal. Will monitor closely for changes throughout tx.     10-Sep-2018 0205  Hand-Off documentation  Report given to (Full Name) Trellis Paganini RN  Report received from (Full Name) Donnamae Jude RN  Vital Signs  Pulse Rate (!) 123  Pulse Rate Source Monitor  Resp 18  BP 95/63  BP Location Left Arm  BP Method Automatic  Patient Position (if appropriate) Lying  Oxygen Therapy  SpO2 100 %  O2 Device Ventilator  End Tidal CO2 (EtCO2) 49  Pain Assessment  Pain Scale CPOT  Critical Care Pain Observation Tool (CPOT)  Facial Expression 0  Body Movements 0  Muscle Tension 0  Compliance with ventilator (intubated pts.) 0  Vocalization (extubated pts.) N/A  CPOT Total 0  Dialysis Weight  Weight 97.8 kg  Type of Weight  (bed wt inaccurate when attempted-+20kg from yesterday.)  Time-Out for Hemodialysis  What Procedure? Hemodialysis  Pt Identifiers(min of two) First/Last Name;MRN/Account#  Correct Site? Yes  Correct Side? Yes  Correct Procedure? Yes  Consents Verified? Yes  Rad Studies Available? N/A  Safety Precautions Reviewed? Yes  Engineer, civil (consulting) Number 2  UF/Alarm Test Passed  Conductivity: Meter 14  Conductivity: Machine  13.8  pH 7  Reverse Osmosis WRO1  Normal Saline Lot Number [id number]  Dialyzer Lot Number [id number]  Disposable Set Lot Number [id number]  Machine Temperature 98.6 F (37 C)  Musician and Audible Yes  Blood Lines Intact and Secured Yes  Pre Treatment Patient Checks  Vascular access used during treatment Fistula  Hepatitis B Surface Antigen Results Negative  Date Hepatitis B Surface Antigen Drawn 08/20/18  Hepatitis B Surface Antibody 218  Date Hepatitis B Surface Antibody Drawn 08/20/18  Hemodialysis Consent  Verified Yes  Hemodialysis Standing Orders Initiated Yes  ECG (Telemetry) Monitor On Yes  Prime Ordered Normal Saline  Length of  DialysisTreatment -hour(s) 3 Hour(s)  Dialysis Treatment Comments Na 140   Dialyzer Elisio 17H NR  Dialysate 2.5 Ca;2K  Dialysate Flow Ordered 600  Blood Flow Rate Ordered 300 mL/min  Ultrafiltration Goal 1.5 Liters (1.5-2.0)  Dialysis Blood Pressure Support Ordered Normal Saline  During Hemodialysis Assessment  Blood Flow Rate (mL/min) 200 mL/min  Arterial Pressure (mmHg) -100 mmHg  Venous Pressure (mmHg) 80 mmHg  Transmembrane Pressure (mmHg) 50 mmHg  Ultrafiltration Rate (mL/min) 670 mL/min (680mL per HOUR fluid removal)  Dialysate Flow Rate (mL/min) 600 ml/min  Conductivity: Machine  14  HD Safety Checks Performed Yes  Dialysis Fluid Bolus Normal Saline  Bolus Amount (mL) 250 mL  Intra-Hemodialysis Comments Tx initiated (BFR to be increased gradually d/t recent code / unstable)  Education / Care Plan  Dialysis Education Provided Yes (Education provided to wife, wife at bedside)  Documented Education in Care Plan Yes  Fistula / Graft Right Upper arm Arteriovenous fistula  Placement Date: 05/15/18   Placed prior to admission: Yes  Orientation: Right  Access Location: Upper arm  Access Type: Arteriovenous fistula  Fistula / Graft Assessment Thrill;Bruit;Present  Status Accessed  Needle Size 16 (3/5 length "short" needles used d/t nature of AVF)  Drainage Description None

## 2018-09-24 NOTE — Progress Notes (Signed)
CRITICAL CARE NOTE SYNOPSIS 73 year old male with known end-stage renal disease on dialysis, admitted for fall and left femoral neck fracture went to the floor. Rapid response called later this afternoon for hematuria. A Foley catheter was placed on the floor and patient had pulled it out causing significant urethral trauma resulting in bleeding with blood clots were passed through the penis and fresh blood was oozing. Stat hemoglobin is stable at 13.  An hour later, anotherrapid response was called as patient's heart rate was greater than 200s. As patient tachypneic, with RR in 30-40 range, HR persistently remaining >200- patient transferred to ICU Started On Bipap for pulm edema noted on CXR.  Patient failed biPAP and was emergently intubated, then cardiac arrested PEA for 5 mins  CC  follow up respiratory failure  SUBJECTIVE Patient remains critically ill Prognosis is guarded On vent multiple vasopressors Cardiogenic shock    BP (!) 66/50   Pulse (!) 109   Temp 97.7 F (36.5 C) (Axillary)   Resp (!) 22   Ht 6\' 5"  (1.956 m)   Wt 97.3 kg   SpO2 99%   BMI 25.44 kg/m    I/O last 3 completed shifts: In: 852.2 [I.V.:812; IV Piggyback:40.2] Out: 501 [Emesis/NG output:1; Other:500] No intake/output data recorded.  SpO2: 99 % O2 Flow Rate (L/min): 2 L/min FiO2 (%): (S) 50 %   SIGNIFICANT EVENTS 8/8 resp failure, ETT SVT 200's, acidosis, vent and vasopressors started 8/8 vasc cath placed 8/8 cardiac arrest PEA  REVIEW OF SYSTEMS  PATIENT IS UNABLE TO PROVIDE COMPLETE REVIEW OF SYSTEMS DUE TO SEVERE CRITICAL ILLNESS   PHYSICAL EXAMINATION:  GENERAL:critically ill appearing, +resp distress HEAD: Normocephalic, atraumatic.  EYES: Pupils equal, round, reactive to light.  No scleral icterus.  MOUTH: Moist mucosal membrane. NECK: Supple. No thyromegaly. No nodules. No JVD.  PULMONARY: +rhonchi, +wheezing CARDIOVASCULAR: S1 and S2. Regular rate and rhythm. No  murmurs, rubs, or gallops.  GASTROINTESTINAL: Soft, nontender, -distended. No masses. Positive bowel sounds. No hepatosplenomegaly.  MUSCULOSKELETAL: No swelling, clubbing, or edema.  NEUROLOGIC: obtunded, GCS<8 SKIN:intact,warm,dry  MEDICATIONS: I have reviewed all medications and confirmed regimen as documented   CULTURE RESULTS   Recent Results (from the past 240 hour(s))  SARS Coronavirus 2 [facility] order, Performed in [facility] hospital lab) Nasopharyngeal Nasopharyngeal Swab     Status: None   Collection Time: 08/31/2018  3:28 PM   Specimen: Nasopharyngeal Swab  Result Value Ref Range Status   SARS Coronavirus 2 NEGATIVE NEGATIVE Final    Comment: (NOTE) If result is NEGATIVE SARS-CoV-2 target nucleic acids are NOT DETECTED. The SARS-CoV-2 RNA is generally detectable in upper and lower  respiratory specimens during the acute phase of infection. The lowest  concentration of SARS-CoV-2 viral copies this assay can detect is 250  copies / mL. A negative result does not preclude SARS-CoV-2 infection  and should not be used as the sole basis for treatment or other  patient management decisions.  A negative result may occur with  improper specimen collection / handling, submission of specimen other  than nasopharyngeal swab, presence of viral mutation(s) within the  areas targeted by this assay, and inadequate number of viral copies  (<250 copies / mL). A negative result must be combined with clinical  observations, patient history, and epidemiological information. If result is POSITIVE SARS-CoV-2 target nucleic acids are DETECTED. The SARS-CoV-2 RNA is generally detectable in upper and lower  respiratory specimens dur ing the acute phase of infection.  Positive  results  are indicative of active infection with SARS-CoV-2.  Clinical  correlation with patient history and other diagnostic information is  necessary to determine patient infection status.  Positive results do  not  rule out bacterial infection or co-infection with other viruses. If result is PRESUMPTIVE POSTIVE SARS-CoV-2 nucleic acids MAY BE PRESENT.   A presumptive positive result was obtained on the submitted specimen  and confirmed on repeat testing.  While 2019 novel coronavirus  (SARS-CoV-2) nucleic acids may be present in the submitted sample  additional confirmatory testing may be necessary for epidemiological  and / or clinical management purposes  to differentiate between  SARS-CoV-2 and other Sarbecovirus currently known to infect humans.  If clinically indicated additional testing with an alternate test  methodology [id number]) is advised. The SARS-CoV-2 RNA is generally  detectable in upper and lower respiratory sp ecimens during the acute  phase of infection. The expected result is Negative. Fact Sheet for Patients:  StrictlyIdeas.no Fact Sheet for Healthcare Providers: BankingDealers.co.za This test is not yet approved or cleared by the [place] FDA and has been authorized for detection and/or diagnosis of SARS-CoV-2 by FDA under an Emergency Use Authorization (EUA).  This EUA will remain in effect (meaning this test can be used) for the duration of the COVID-19 declaration under Section 564(b)(1) of the Act, 21 U.S.C. section 360bbb-3(b)(1), unless the authorization is terminated or revoked sooner. Performed at [facility], [address], [place], [place] [address]   Surgical PCR screen     Status: Abnormal   Collection Time: 09/15/2018  5:45 PM   Specimen: Nasal Mucosa; Nasal Swab  Result Value Ref Range Status   MRSA, PCR NEGATIVE NEGATIVE Final   Staphylococcus aureus POSITIVE (A) NEGATIVE Final    Comment: (NOTE) The Xpert SA Assay (FDA approved for NASAL specimens in patients 74 years of age and older), is one component of a comprehensive surveillance program. It is not intended to diagnose infection nor  to guide or monitor treatment. Performed at [facility], [address] [place], [place] [address]   MRSA PCR Screening     Status: None   Collection Time: 19-Sep-2018 12:49 AM   Specimen: Nasopharyngeal  Result Value Ref Range Status   MRSA by PCR NEGATIVE NEGATIVE Final    Comment:        The GeneXpert MRSA Assay (FDA approved for NASAL specimens only), is one component of a comprehensive MRSA colonization surveillance program. It is not intended to diagnose MRSA infection nor to guide or monitor treatment for MRSA infections. Performed at [facility], [address], [place], [place] [address]           IMAGING    Dg Pelvis 1-2 Views  Result Date: 09/16/2018 CLINICAL DATA:  Fall yesterday. Inability to stand or bear weight. EXAM: PELVIS - 1-2 VIEW COMPARISON:  Pelvic and right hip radiographs 05/15/2018. Pelvic CT 07/31/2018. FINDINGS: The bones are mildly demineralized. Previous right femoral neck pinning with intact hardware. No evidence of acute fracture or dislocation. The sacroiliac joints and symphysis pubis are intact IMPRESSION: No evidence of acute pelvic fracture. Previous right femoral neck pinning. Electronically Signed   By: Richardean Sale M.D.   On: 09/07/2018 12:27   Dg Tibia/fibula Left  Result Date: 09/19/2018 CLINICAL DATA:  Fall yesterday. Inability to stand or bear weight. EXAM: LEFT TIBIA AND FIBULA - 2 VIEW COMPARISON:  None. FINDINGS: The bones are demineralized. No evidence of acute fracture or dislocation. Possible mild soft  tissue swelling medially in the ankle. No foreign body. IMPRESSION: No evidence of acute left lower leg injury. Electronically Signed   By: Richardean Sale M.D.   On: 08/26/2018 12:25   Dg Abd 1 View  Result Date: 09/19/2018 CLINICAL DATA:  OG tube placement EXAM: ABDOMEN - 1 VIEW COMPARISON:  None. FINDINGS: There is an OG tube extends into the stomach and is coiled several times. The tip projects over the  gastric body. The visualized bowel gas pattern is nonspecific and nonobstructive. IMPRESSION: OG tube projects over the stomach. Electronically Signed   By: Constance Holster M.D.   On: 09/18/2018 22:53   Ct Hip Left Wo Contrast  Result Date: 09/10/2018 CLINICAL DATA:  Hip pain since falling yesterday. EXAM: CT OF THE LEFT HIP WITHOUT CONTRAST TECHNIQUE: Multidetector CT imaging of the left hip was performed according to the standard protocol. Multiplanar CT image reconstructions were also generated. COMPARISON:  Radiographs 09/04/2018. pelvic CT 07/31/2018. FINDINGS: Bones/Joint/Cartilage The bones appear demineralized. There is a minimally impacted and minimally displaced subcapital fracture of the left femoral neck. The femoral head is intact and located. No evidence of left pelvic fracture or significant left hip arthropathy. There is a small left hip joint effusion. Ligaments Suboptimally assessed by CT. Muscles and Tendons The left hip muscles and tendons appear intact. There is a probable trochanteric bursal fluid collection lateral to the greater trochanter, measuring 5.6 cm on coronal image 55/6, similar to previous CT. No focal hematoma. Soft tissues There is a left inguinal hernia containing fat and moderate enlargement of the prostate gland. No evidence of pelvic hematoma. IMPRESSION: 1. Acute, minimally impacted and minimally displaced subcapital fracture of the left femoral neck. 2. No evidence of left pelvic fracture or significant left hip arthropathy. Electronically Signed   By: Richardean Sale M.D.   On: 09/05/2018 14:12   Portable Chest X-ray  Result Date: 09/16/2018 CLINICAL DATA:  Check endotracheal tube placement EXAM: PORTABLE CHEST 1 VIEW COMPARISON:  Film from earlier in the same day. FINDINGS: Endotracheal tube, gastric catheter and left jugular temporary dialysis catheter are noted in satisfactory position. No pneumothorax is noted. Cardiac shadow is stable. Diffuse vascular  congestion and mild pulmonary edema is again seen. No bony abnormality is noted. IMPRESSION: Tubes and lines as described above.  No pneumothorax is noted. Stable pulmonary edema. Electronically Signed   By: Inez Catalina M.D.   On: 08/28/2018 22:31   Dg Chest Port 1 View  Result Date: 09/04/2018 CLINICAL DATA:  Tachycardia.  Low O2 sats. EXAM: PORTABLE CHEST 1 VIEW COMPARISON:  August 31, 2018 FINDINGS: Cardiomegaly and mild edema.  No other abnormalities. IMPRESSION: Cardiomegaly.  New mild pulmonary edema. Electronically Signed   By: Dorise Bullion III M.D   On: 08/24/2018 20:27   Dg Chest Portable 1 View  Result Date: 09/22/2018 CLINICAL DATA:  73 year old male with a history of fall and weakness EXAM: PORTABLE CHEST 1 VIEW COMPARISON:  May 20, 2018 FINDINGS: Cardiomediastinal silhouette unchanged in size and contour with cardiomegaly. Calcifications of the aortic arch. Reticular opacities throughout the lungs with improved aeration compared to the prior. No pleural effusion or pneumothorax. No confluent airspace disease. No visualized displaced fracture IMPRESSION: Improved aeration compared to the prior plain film with persisting reticular pattern of opacity most likely representing chronic interstitial lung disease. Electronically Signed   By: Corrie Mckusick D.O.   On: 08/28/2018 12:26   Dg Femur Min 2 Views Left  Result Date: 09/03/2018 CLINICAL DATA:  Fall yesterday.  Inability to stand or bear weight. EXAM: LEFT FEMUR 2 VIEWS COMPARISON:  Pelvic and right hip radiographs 05/14/2008 FINDINGS: The bones are mildly demineralized. There is no evidence of acute fracture or dislocation. There are mild tricompartmental degenerative changes at the left knee. Mild vascular calcifications are noted. IMPRESSION: No evidence of acute left femoral injury. Electronically Signed   By: Richardean Sale M.D.   On: 09/16/2018 12:24   CBC    Component Value Date/Time   WBC 21.5 (H) September 10, 2018 0500   RBC 3.88  (L) 09-10-18 0500   HGB 11.5 (L) September 10, 2018 0500   HGB 15.4 11/05/2014 1546   HCT 36.0 (L) 2018/09/10 0500   HCT 45.6 11/05/2014 1546   PLT 158 09/10/18 0500   PLT 213 11/05/2014 1546   MCV 92.8 10-Sep-2018 0500   MCV 88 11/05/2014 1546   MCV 92 07/09/2013 2242   MCH 29.6 09-10-2018 0500   MCHC 31.9 September 10, 2018 0500   RDW 16.3 (H) 2018/09/10 0500   RDW 14.3 11/05/2014 1546   RDW 15.5 (H) 07/09/2013 2242   LYMPHSABS 2.2 08/30/2018 1222   LYMPHSABS 2.5 11/05/2014 1546   MONOABS 0.6 09/12/2018 1222   EOSABS 0.4 09/05/2018 1222   EOSABS 0.1 11/05/2014 1546   BASOSABS 0.1 08/28/2018 1222   BASOSABS 0.0 11/05/2014 1546   BMP Latest Ref Rng & Units 09-10-18 09/22/2018 09/22/2018  Glucose 70 - 99 mg/dL 130(H) 332(H) 180(H)  BUN 8 - 23 mg/dL 34(H) 60(H) 58(H)  Creatinine 0.61 - 1.24 mg/dL 3.71(H) 6.56(H) 6.28(H)  BUN/Creat Ratio 10 - 24 - - -  Sodium 135 - 145 mmol/L 138 137 135  Potassium 3.5 - 5.1 mmol/L 3.5 5.1 5.8(H)  Chloride 98 - 111 mmol/L 102 100 100  CO2 22 - 32 mmol/L 24 24 19(L)  Calcium 8.9 - 10.3 mg/dL 8.7(L) 10.3 8.8(L)     Central Line/ continued, requirement due to  Reason to continue [employer] of central venous pressure or other hemodynamic parameters and poor IV access   Ventilator continued, requirement due to severe respiratory failure   Ventilator Sedation RASS 0 to -2      ASSESSMENT AND PLAN SYNOPSIS  73 yo AAM admitted to ICU for acute resp failure with SVT in setting of ESRD on HD with gross hematuria from traumatic foley removal by patient with acute diastolic heart failure s/p cardiac arrest and severe cardiogenic shock   Severe ACUTE Hypoxic and Hypercapnic Respiratory Failure -continue Full MV support -continue Bronchodilator Therapy -Wean Fio2 and PEEP as tolerated  ACUTE DIASTOLIC CARDIAC FAILURE-  Check ECHO    CHRONIC Renal Failure May need CRRT HD as needed   SHOCK-SEPSIS/CARDIOGENIC -use vasopressors to keep  MAP>65 -follow ABG and LA -follow up cultures -emperic ABX -consider stress dose steroids   CARDIAC ICU monitoring  ID -continue IV abx as prescibed -follow up cultures  GI GI PROPHYLAXIS as indicated  NUTRITIONAL STATUS DIET-->NPO Constipation protocol as indicated  ENDO - will use ICU hypoglycemic\Hyperglycemia protocol if indicated   ELECTROLYTES -follow labs as needed -replace as needed -pharmacy consultation and following   DVT/GI PRX ordered TRANSFUSIONS AS NEEDED MONITOR FSBS ASSESS the need for LABS as needed   Critical Care Time devoted to patient care services described in this note is 45 minutes.   Overall, patient is critically ill, prognosis is guarded.  Patient with Multiorgan failure and at high risk for cardiac arrest and death.   Recommend DNR status and may need to  consider Comfort care  Corrin Parker, M.D.  [facility]  Medical Director [place] Director [facility] Cardio-Pulmonary Department

## 2018-09-24 NOTE — Progress Notes (Addendum)
At patient bedside responding to Rapid Response call, patient alert to self HR elevated and sustaining SVT 200-210.  Patient tacypneic on 100%NRB.  Orders received for metoprolol 5mg  IV.  (2020) MD at bedside orders received for patient transfer

## 2018-09-24 NOTE — Code Documentation (Signed)
Patient HR dropped with widening of his rhythm, patient noted to be in PEA. CPR started Code Blue called.  Epi x1, Bicarb x1 and Calcium x2 given ROSC achieved. Plans to notify nephrology of need for Emergent dialysis.

## 2018-09-24 NOTE — Progress Notes (Signed)
Pt extubated. No stridor noted. Resting comfortably.

## 2018-09-24 NOTE — Progress Notes (Signed)
Family At bedside, clinical status relayed to family(wfie)  Updated and notified of patients medical condition-  Progressive multiorgan failure with very low chance of meaningful recovery.  Patient s/p cardiac arrest with vent support  Family understands the situation.  They have consented and agreed to DNR/DNI and would like to proceed with Comfort care measures.   Family are satisfied with Plan of action and management. All questions answered  Corrin Parker, M.D.  [facility]  Medical Director [place] Director [facility] Cardio-Pulmonary Department

## 2018-09-24 DEATH — deceased

## 2018-10-04 ENCOUNTER — Encounter (INDEPENDENT_AMBULATORY_CARE_PROVIDER_SITE_OTHER): Payer: Medicare Other

## 2018-10-04 ENCOUNTER — Ambulatory Visit (INDEPENDENT_AMBULATORY_CARE_PROVIDER_SITE_OTHER): Payer: Medicare Other | Admitting: Nurse Practitioner

## 2018-10-20 ENCOUNTER — Other Ambulatory Visit: Payer: Self-pay | Admitting: Family Medicine

## 2018-10-20 DIAGNOSIS — E1169 Type 2 diabetes mellitus with other specified complication: Secondary | ICD-10-CM

## 2018-10-25 ENCOUNTER — Ambulatory Visit: Payer: Medicare Other | Admitting: Family Medicine

## 2021-02-17 IMAGING — CT CT ABDOMEN AND PELVIS WITHOUT CONTRAST
2 of 5 series · 15 of 46 positions shown, 17 images · non-contrast
Comparison: CT the abdomen and pelvis 06/04/2015. PET-CT
12/14/2017.

CLINICAL DATA: 73-year-old male with history of gross hematuria.

EXAM:
CT ABDOMEN AND PELVIS WITHOUT CONTRAST
TECHNIQUE: Multidetector CT imaging of the abdomen and pelvis was performed
following the standard protocol without IV contrast.

[Series 2: routine abd/pel wo · axial · 0.86mm/px · z∈[-1000,-580]mm · 12 of 99 slices shown, 14 images]
[im 8/99  soft-tissue]
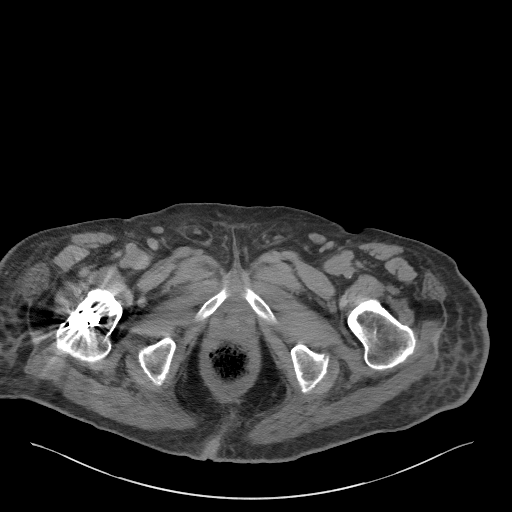
[im 8/99  bone]
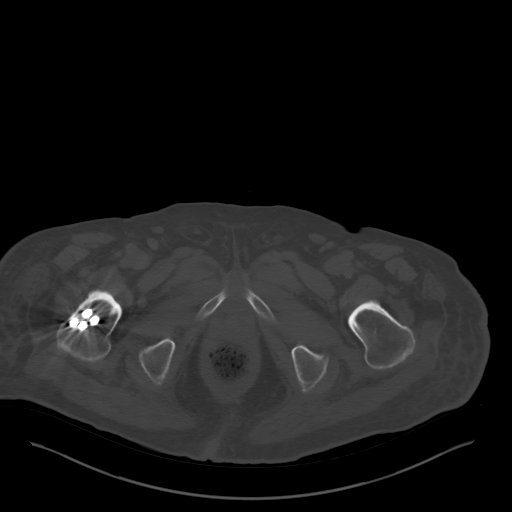
[im 15/99  soft-tissue]
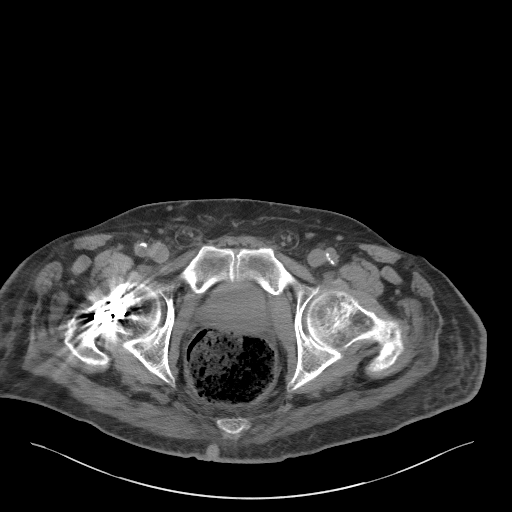
[im 22/99  soft-tissue]
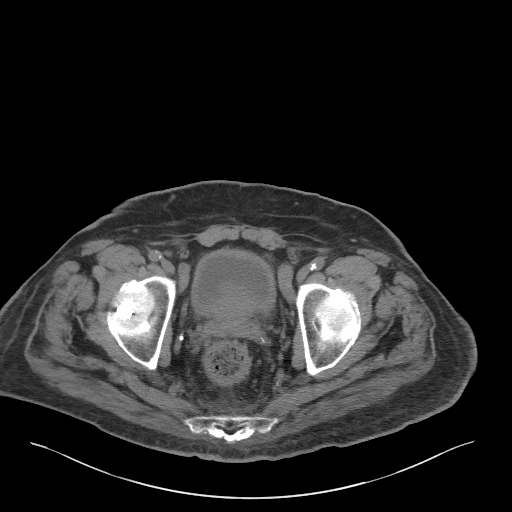
[im 29/99  soft-tissue]
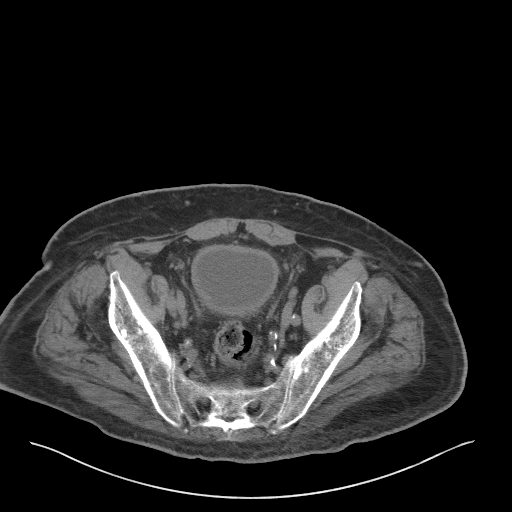
[im 36/99  soft-tissue]
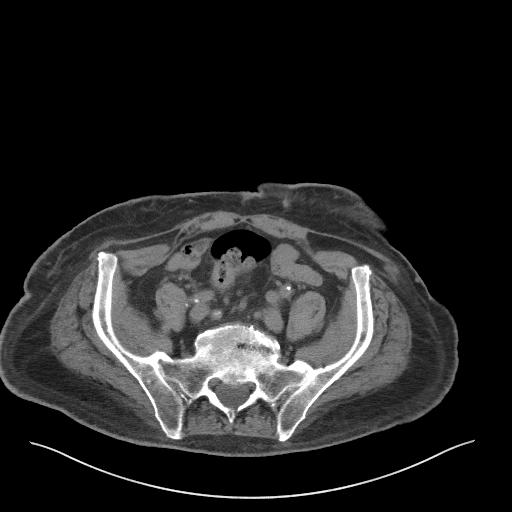
[im 43/99  soft-tissue]
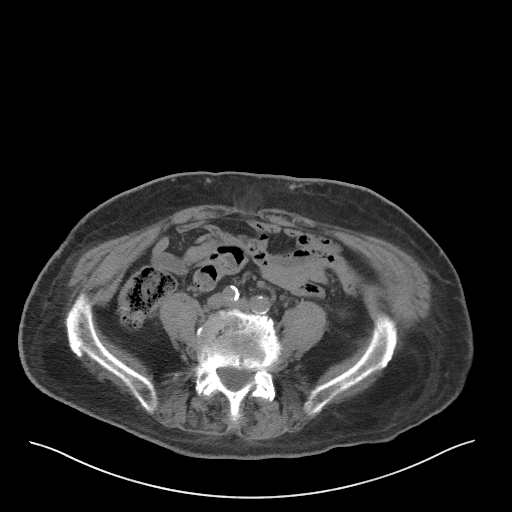
[im 57/99  soft-tissue]
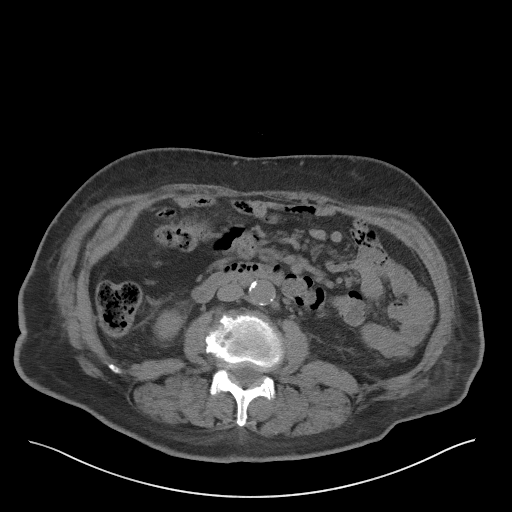
[im 64/99  soft-tissue]
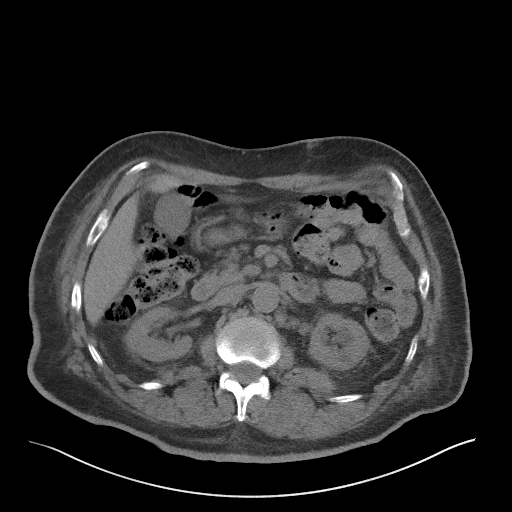
[im 71/99  soft-tissue]
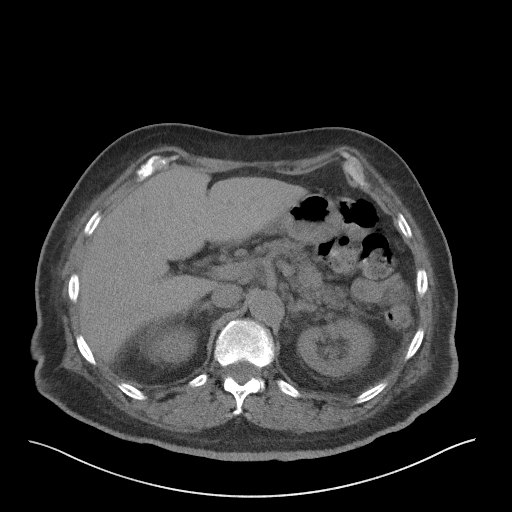
[im 71/99  bone]
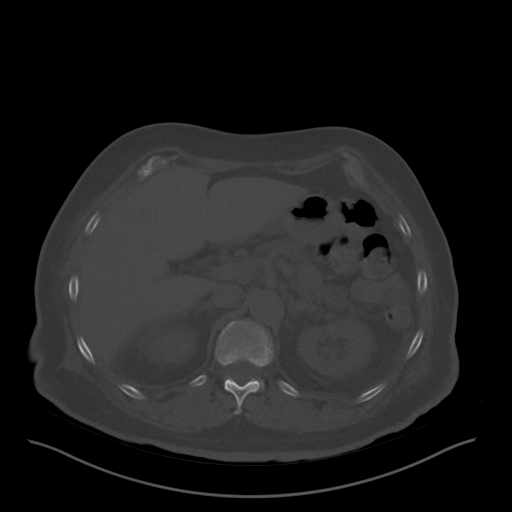
[im 78/99  soft-tissue]
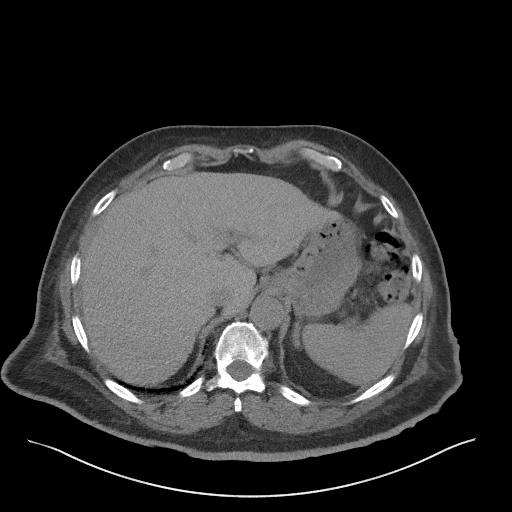
[im 85/99  soft-tissue]
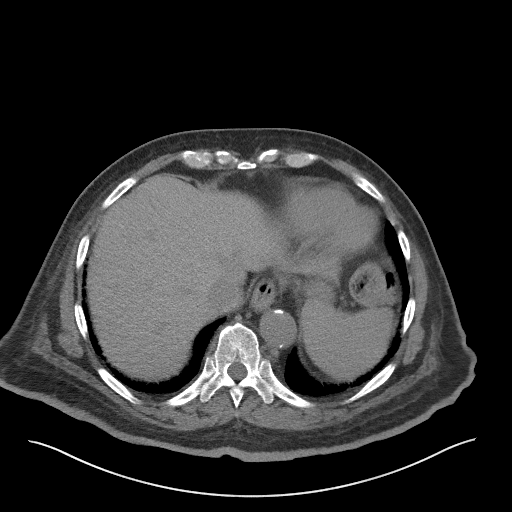
[im 92/99  soft-tissue]
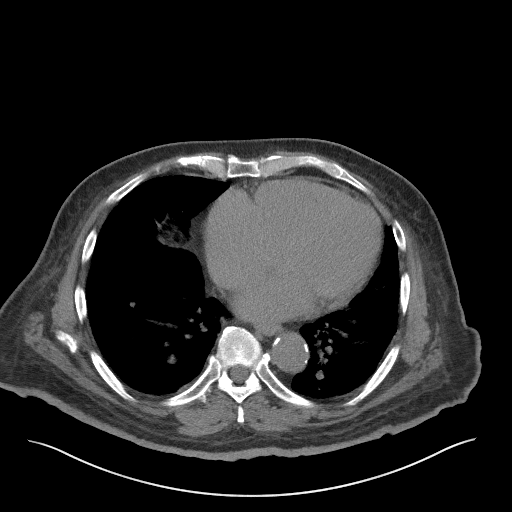

[Series 6: coronal st · coronal · 0.85mm/px · 3 of 99 slices shown]
[im 33/99  soft-tissue]
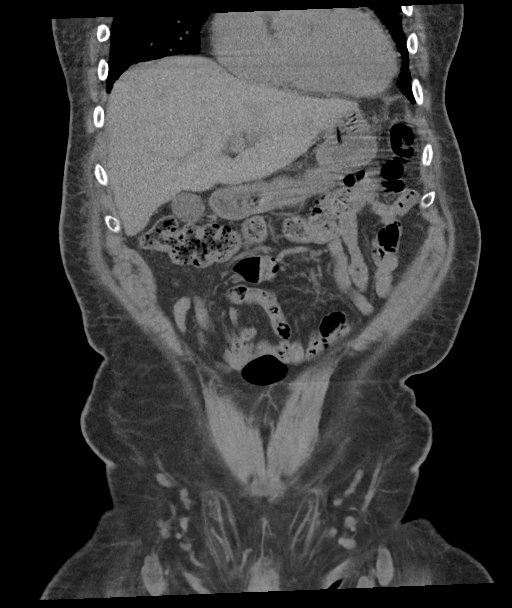
[im 44/99  soft-tissue]
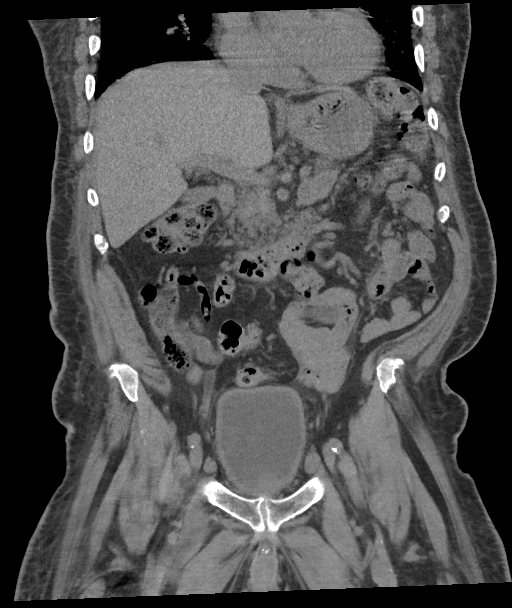
[im 55/99  soft-tissue]
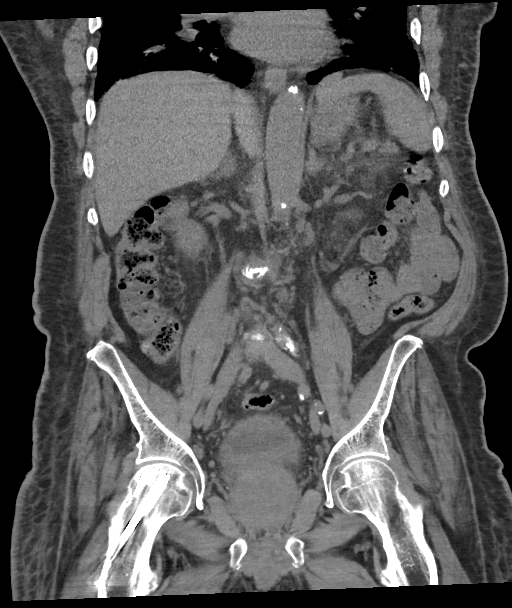

[15 of 46 positions shown; findings below may reference images not displayed]

FINDINGS: Lower chest: Mild scarring in the visualize lung bases.
Atherosclerotic calcifications in the distal descending thoracic
aorta as well as the right coronary artery.

Hepatobiliary: No definite suspicious cystic or solid hepatic
lesions are confidently identified on today's noncontrast CT
examination. Unenhanced appearance of the gallbladder is
unremarkable.

Pancreas: No definite pancreatic mass or peripancreatic fluid
collections or inflammatory changes are noted on today's noncontrast
CT examination.

Spleen: Unremarkable.

Adrenals/Urinary Tract: There are no abnormal calcifications within
the collecting system of either kidney, along the course of either
ureter, or within the lumen of the urinary bladder. Mild bilateral
perinephric stranding (nonspecific). No hydroureteronephrosis to
suggest urinary tract obstruction at this time. The unenhanced
appearance of the kidneys is unremarkable bilaterally. Unenhanced
appearance of the urinary bladder is normal. Bilateral adrenal
glands are normal in appearance.

Stomach/Bowel: Unenhanced appearance of the stomach is normal. No
pathologic dilatation of small bowel or colon. Normal appendix.

Vascular/Lymphatic: Aortic atherosclerosis. No lymphadenopathy noted
in the abdomen or pelvis.

Reproductive: Prostate gland and seminal vesicles are unremarkable
in appearance.

Other: No significant volume of ascites.  No pneumoperitoneum.

Musculoskeletal: 4 orthopedic fixation screws in the right femoral
neck. Chronic appearing compression fractures of superior endplate
of L3 (60% loss of anterior vertebral body height) and inferior
endplate of L1 (30% loss of anterior vertebral body height), new
compared to prior studies. There are no aggressive appearing lytic
or blastic lesions noted in the visualized portions of the skeleton.
IMPRESSION: 1. No definite explanation for the patient's history of gross
hematuria. Specifically, no urinary tract calculi.
2. Mild bilateral perinephric stranding. This is nonspecific, but
may indicate underlying medical renal disease.
3. Aortic atherosclerosis, in addition to at least right coronary
artery disease. Assessment for potential risk factor modification,
dietary therapy or pharmacologic therapy may be warranted, if
clinically indicated.
4. Additional incidental findings, as above.
